# Patient Record
Sex: Male | Born: 1940 | Race: White | Hispanic: No | Marital: Married | Smoking: Never smoker
Health system: Southern US, Community
[De-identification: ages and names within clinical notes are randomized; demographics above are authoritative.]

## PROBLEM LIST (undated history)

## (undated) DIAGNOSIS — G709 Myoneural disorder, unspecified: Secondary | ICD-10-CM

## (undated) DIAGNOSIS — I4891 Unspecified atrial fibrillation: Secondary | ICD-10-CM

## (undated) DIAGNOSIS — E118 Type 2 diabetes mellitus with unspecified complications: Secondary | ICD-10-CM

## (undated) DIAGNOSIS — I2510 Atherosclerotic heart disease of native coronary artery without angina pectoris: Secondary | ICD-10-CM

## (undated) DIAGNOSIS — I9789 Other postprocedural complications and disorders of the circulatory system, not elsewhere classified: Secondary | ICD-10-CM

## (undated) DIAGNOSIS — I10 Essential (primary) hypertension: Secondary | ICD-10-CM

## (undated) DIAGNOSIS — T7840XA Allergy, unspecified, initial encounter: Secondary | ICD-10-CM

## (undated) DIAGNOSIS — E785 Hyperlipidemia, unspecified: Secondary | ICD-10-CM

## (undated) DIAGNOSIS — G4700 Insomnia, unspecified: Secondary | ICD-10-CM

## (undated) DIAGNOSIS — E119 Type 2 diabetes mellitus without complications: Secondary | ICD-10-CM

## (undated) HISTORY — DX: Type 2 diabetes mellitus with unspecified complications: E11.8

## (undated) HISTORY — DX: Type 2 diabetes mellitus without complications: E11.9

## (undated) HISTORY — DX: Allergy, unspecified, initial encounter: T78.40XA

## (undated) HISTORY — PX: CARDIAC CATHETERIZATION: SHX172

## (undated) HISTORY — DX: Essential (primary) hypertension: I10

## (undated) HISTORY — DX: Other postprocedural complications and disorders of the circulatory system, not elsewhere classified: I97.89

## (undated) HISTORY — DX: Unspecified atrial fibrillation: I48.91

## (undated) HISTORY — DX: Hyperlipidemia, unspecified: E78.5

## (undated) HISTORY — PX: EYE SURGERY: SHX253

## (undated) HISTORY — PX: TONSILLECTOMY: SUR1361

## (undated) HISTORY — DX: Insomnia, unspecified: G47.00

## (undated) HISTORY — PX: BACK SURGERY: SHX140

---

## 1991-03-10 HISTORY — PX: LAMINECTOMY: SHX219

## 1994-03-09 HISTORY — PX: MENISCUS REPAIR: SHX5179

## 2015-03-10 DIAGNOSIS — H269 Unspecified cataract: Secondary | ICD-10-CM

## 2015-03-10 HISTORY — DX: Unspecified cataract: H26.9

## 2015-10-07 ENCOUNTER — Encounter (INDEPENDENT_AMBULATORY_CARE_PROVIDER_SITE_OTHER): Payer: Self-pay

## 2015-10-25 ENCOUNTER — Ambulatory Visit (INDEPENDENT_AMBULATORY_CARE_PROVIDER_SITE_OTHER): Payer: Medicare Other | Admitting: Family Medicine

## 2015-10-25 ENCOUNTER — Encounter: Payer: Self-pay | Admitting: Family Medicine

## 2015-10-25 ENCOUNTER — Ambulatory Visit: Payer: Self-pay | Admitting: Unknown Physician Specialty

## 2015-10-25 VITALS — BP 157/74 | HR 65 | Temp 98.0°F | Ht 69.75 in | Wt 171.0 lb

## 2015-10-25 DIAGNOSIS — E785 Hyperlipidemia, unspecified: Secondary | ICD-10-CM

## 2015-10-25 DIAGNOSIS — E1159 Type 2 diabetes mellitus with other circulatory complications: Secondary | ICD-10-CM | POA: Insufficient documentation

## 2015-10-25 DIAGNOSIS — Z7189 Other specified counseling: Secondary | ICD-10-CM

## 2015-10-25 DIAGNOSIS — I10 Essential (primary) hypertension: Secondary | ICD-10-CM | POA: Diagnosis not present

## 2015-10-25 DIAGNOSIS — E119 Type 2 diabetes mellitus without complications: Secondary | ICD-10-CM

## 2015-10-25 DIAGNOSIS — I152 Hypertension secondary to endocrine disorders: Secondary | ICD-10-CM | POA: Insufficient documentation

## 2015-10-25 DIAGNOSIS — Z7689 Persons encountering health services in other specified circumstances: Secondary | ICD-10-CM

## 2015-10-25 MED ORDER — AMLODIPINE BESYLATE 10 MG PO TABS: 10.0000 mg | ORAL_TABLET | Freq: Every day | ORAL | 4 refills | Status: DC

## 2015-10-25 MED ORDER — LISINOPRIL-HYDROCHLOROTHIAZIDE 20-12.5 MG PO TABS: 1.0000 | ORAL_TABLET | Freq: Every day | ORAL | 4 refills | Status: DC

## 2015-10-25 MED ORDER — TRAZODONE HCL 50 MG PO TABS: 100.0000 mg | ORAL_TABLET | Freq: Every day | ORAL | 4 refills | Status: DC

## 2015-10-25 MED ORDER — METOPROLOL TARTRATE 50 MG PO TABS: 50.0000 mg | ORAL_TABLET | Freq: Two times a day (BID) | ORAL | 4 refills | Status: DC

## 2015-10-25 MED ORDER — METFORMIN HCL 1000 MG PO TABS: 1000.0000 mg | ORAL_TABLET | Freq: Two times a day (BID) | ORAL | 4 refills | Status: DC

## 2015-10-25 MED ORDER — FENOFIBRATE 160 MG PO TABS: 160.0000 mg | ORAL_TABLET | Freq: Every day | ORAL | 4 refills | Status: DC

## 2015-10-25 MED ORDER — GLIMEPIRIDE 4 MG PO TABS
4.0000 mg | ORAL_TABLET | Freq: Every day | ORAL | 4 refills | Status: DC
Start: 2015-10-25 — End: 2016-08-17

## 2015-10-25 MED ORDER — PRAVASTATIN SODIUM 20 MG PO TABS: 20.0000 mg | ORAL_TABLET | Freq: Every day | ORAL | 4 refills | Status: DC

## 2015-10-25 MED ORDER — SITAGLIPTIN PHOSPHATE 100 MG PO TABS: 100.0000 mg | ORAL_TABLET | Freq: Every day | ORAL | 4 refills | Status: DC

## 2015-10-25 MED ORDER — LISINOPRIL 20 MG PO TABS: 20.0000 mg | ORAL_TABLET | Freq: Every day | ORAL | 4 refills | Status: DC

## 2015-10-25 NOTE — Patient Instructions (Signed)
Follow up as scheduled.  

## 2015-10-25 NOTE — Progress Notes (Signed)
BP (!) 157/74   Pulse 65   Temp 98 F (36.7 C)   Ht 5' 9.75" (1.772 m)   Wt 171 lb (77.6 kg)   SpO2 97%   BMI 24.71 kg/m    Subjective:    Patient ID: Luis Wise, male    DOB: 08/26/1940, 75 y.o.   MRN: [id number]  HPI: Luis Wise is a 75 y.o. male  Chief Complaint  Patient presents with  . Establish Care    [place]oved here from [place]. States he is up to date on his vaccines, will get his medical records with those dates.    Patient presents today to Establish Care. He has several stable chronic conditions, including HTN, HLD, and DM type 2. He will be needing refills on all medications as they will run out in September. He is also in need of a Flight Physical in the near future. No concerns today.  Relevant past medical, surgical, family and social history reviewed and updated as indicated. Interim medical history since our last visit reviewed. Allergies and medications reviewed and updated.  Review of Systems  Constitutional: Negative.   Respiratory: Negative.   Cardiovascular: Negative.   Gastrointestinal: Negative.   Musculoskeletal: Negative.   Neurological: Negative.   Psychiatric/Behavioral: Negative.     Per HPI unless specifically indicated above     Objective:    BP (!) 157/74   Pulse 65   Temp 98 F (36.7 C)   Ht 5' 9.75" (1.772 m)   Wt 171 lb (77.6 kg)   SpO2 97%   BMI 24.71 kg/m   Wt Readings from Last 3 Encounters:  10/25/15 171 lb (77.6 kg)    Physical Exam  Constitutional: He is oriented to person, place, and time. He appears well-developed and well-nourished. No distress.  HENT:  Head: Atraumatic.  Eyes: Conjunctivae are normal. No scleral icterus.  Neck: Normal range of motion. Neck supple.  Cardiovascular: Normal rate and normal heart sounds.   Pulmonary/Chest: Effort normal and breath sounds normal. No respiratory distress.  Musculoskeletal: Normal range of motion.  Neurological: He is alert and oriented to person, place, and time.    Skin: Skin is warm and dry.  Psychiatric: He has a normal mood and affect. His behavior is normal.  Nursing note and vitals reviewed.   No results found for this or any previous visit.    Assessment & Plan:   Problem List Items Addressed This Visit      Cardiovascular and Mediastinum   Benign essential HTN   Relevant Medications   amLODipine (NORVASC) 10 MG tablet   fenofibrate 160 MG tablet   lisinopril (PRINIVIL,ZESTRIL) 20 MG tablet   lisinopril-hydrochlorothiazide (PRINZIDE,ZESTORETIC) 20-12.5 MG tablet   metoprolol (LOPRESSOR) 50 MG tablet   pravastatin (PRAVACHOL) 20 MG tablet     Endocrine   Diabetes mellitus (HCC)   Relevant Medications   glimepiride (AMARYL) 4 MG tablet   lisinopril (PRINIVIL,ZESTRIL) 20 MG tablet   lisinopril-hydrochlorothiazide (PRINZIDE,ZESTORETIC) 20-12.5 MG tablet   metFORMIN (GLUCOPHAGE) 1000 MG tablet   pravastatin (PRAVACHOL) 20 MG tablet   sitaGLIPtin (JANUVIA) 100 MG tablet     Other   Hyperlipemia   Relevant Medications   amLODipine (NORVASC) 10 MG tablet   fenofibrate 160 MG tablet   lisinopril (PRINIVIL,ZESTRIL) 20 MG tablet   lisinopril-hydrochlorothiazide (PRINZIDE,ZESTORETIC) 20-12.5 MG tablet   metoprolol (LOPRESSOR) 50 MG tablet   pravastatin (PRAVACHOL) 20 MG tablet    Other Visit Diagnoses    Encounter to establish care    -  Primary    Medication refills sent today, record release form filled out. He will schedule Flight Physical at his earliest convenience.    Follow up plan: Return for Physical.

## 2015-12-11 DIAGNOSIS — H2513 Age-related nuclear cataract, bilateral: Secondary | ICD-10-CM | POA: Diagnosis not present

## 2015-12-11 LAB — HM DIABETES EYE EXAM

## 2015-12-17 DIAGNOSIS — Z23 Encounter for immunization: Secondary | ICD-10-CM | POA: Diagnosis not present

## 2016-05-09 ENCOUNTER — Encounter: Payer: Self-pay | Admitting: Family Medicine

## 2016-05-29 ENCOUNTER — Encounter: Payer: Self-pay | Admitting: Unknown Physician Specialty

## 2016-06-03 ENCOUNTER — Other Ambulatory Visit: Payer: Medicare Other

## 2016-06-03 DIAGNOSIS — E119 Type 2 diabetes mellitus without complications: Secondary | ICD-10-CM | POA: Diagnosis not present

## 2016-06-03 DIAGNOSIS — I10 Essential (primary) hypertension: Secondary | ICD-10-CM

## 2016-06-03 DIAGNOSIS — E782 Mixed hyperlipidemia: Secondary | ICD-10-CM

## 2016-06-03 DIAGNOSIS — R3911 Hesitancy of micturition: Secondary | ICD-10-CM

## 2016-06-03 LAB — UA/M W/RFLX CULTURE, ROUTINE
Bilirubin, UA: NEGATIVE
Glucose, UA: NEGATIVE
Leukocytes, UA: NEGATIVE
Nitrite, UA: NEGATIVE
RBC, UA: NEGATIVE
Specific Gravity, UA: 1.02 (ref 1.005–1.030)
Urobilinogen, Ur: 0.2 mg/dL (ref 0.2–1.0)
pH, UA: 5.5 (ref 5.0–7.5)

## 2016-06-03 LAB — MICROSCOPIC EXAMINATION

## 2016-06-04 ENCOUNTER — Encounter: Payer: Self-pay | Admitting: Family Medicine

## 2016-06-04 LAB — COMPREHENSIVE METABOLIC PANEL
ALT: 20 IU/L (ref 0–44)
AST: 20 IU/L (ref 0–40)
Albumin/Globulin Ratio: 2.4 — ABNORMAL HIGH (ref 1.2–2.2)
Albumin: 5 g/dL — ABNORMAL HIGH (ref 3.5–4.8)
Alkaline Phosphatase: 41 IU/L (ref 39–117)
BUN/Creatinine Ratio: 12 (ref 10–24)
BUN: 15 mg/dL (ref 8–27)
Bilirubin Total: 0.4 mg/dL (ref 0.0–1.2)
CO2: 24 mmol/L (ref 18–29)
Calcium: 9.7 mg/dL (ref 8.6–10.2)
Chloride: 99 mmol/L (ref 96–106)
Creatinine, Ser: 1.24 mg/dL (ref 0.76–1.27)
GFR calc Af Amer: 66 mL/min/{1.73_m2} (ref >59)
GFR calc non Af Amer: 57 mL/min/{1.73_m2} — ABNORMAL LOW (ref >59)
Globulin, Total: 2.1 g/dL (ref 1.5–4.5)
Glucose: 137 mg/dL — ABNORMAL HIGH (ref 65–99)
Potassium: 4.4 mmol/L (ref 3.5–5.2)
Sodium: 140 mmol/L (ref 134–144)
Total Protein: 7.1 g/dL (ref 6.0–8.5)

## 2016-06-04 LAB — CBC WITH DIFFERENTIAL/PLATELET
Basophils Absolute: 0.1 10*3/uL (ref 0.0–0.2)
Basos: 1 %
EOS (ABSOLUTE): 0.1 10*3/uL (ref 0.0–0.4)
Eos: 2 %
Hematocrit: 41.6 % (ref 37.5–51.0)
Hemoglobin: 14.6 g/dL (ref 13.0–17.7)
Immature Grans (Abs): 0 10*3/uL (ref 0.0–0.1)
Immature Granulocytes: 0 %
Lymphocytes Absolute: 1 10*3/uL (ref 0.7–3.1)
Lymphs: 18 %
MCH: 28.7 pg (ref 26.6–33.0)
MCHC: 35.1 g/dL (ref 31.5–35.7)
MCV: 82 fL (ref 79–97)
Monocytes Absolute: 0.6 10*3/uL (ref 0.1–0.9)
Monocytes: 10 %
Neutrophils Absolute: 3.8 10*3/uL (ref 1.4–7.0)
Neutrophils: 69 %
Platelets: 287 10*3/uL (ref 150–379)
RBC: 5.08 x10E6/uL (ref 4.14–5.80)
RDW: 13.7 % (ref 12.3–15.4)
WBC: 5.5 10*3/uL (ref 3.4–10.8)

## 2016-06-04 LAB — TSH: TSH: 0.921 u[IU]/mL (ref 0.450–4.500)

## 2016-06-04 LAB — LIPID PANEL
Chol/HDL Ratio: 4.6 ratio units (ref 0.0–5.0)
Cholesterol, Total: 137 mg/dL (ref 100–199)
HDL: 30 mg/dL — ABNORMAL LOW (ref >39)
LDL Calculated: 79 mg/dL (ref 0–99)
Triglycerides: 141 mg/dL (ref 0–149)
VLDL Cholesterol Cal: 28 mg/dL (ref 5–40)

## 2016-06-04 LAB — PSA: Prostate Specific Ag, Serum: 0.9 ng/mL (ref 0.0–4.0)

## 2016-06-08 ENCOUNTER — Encounter: Payer: Self-pay | Admitting: Family Medicine

## 2016-06-08 ENCOUNTER — Ambulatory Visit (INDEPENDENT_AMBULATORY_CARE_PROVIDER_SITE_OTHER): Payer: Medicare Other | Admitting: Family Medicine

## 2016-06-08 VITALS — BP 138/70 | HR 73 | Temp 97.9°F | Ht 69.5 in | Wt 173.6 lb

## 2016-06-08 DIAGNOSIS — N400 Enlarged prostate without lower urinary tract symptoms: Secondary | ICD-10-CM

## 2016-06-08 DIAGNOSIS — E782 Mixed hyperlipidemia: Secondary | ICD-10-CM

## 2016-06-08 DIAGNOSIS — I10 Essential (primary) hypertension: Secondary | ICD-10-CM | POA: Diagnosis not present

## 2016-06-08 DIAGNOSIS — E119 Type 2 diabetes mellitus without complications: Secondary | ICD-10-CM | POA: Diagnosis not present

## 2016-06-08 LAB — BAYER DCA HB A1C WAIVED: HB A1C (BAYER DCA - WAIVED): 6.3 % (ref <7.0)

## 2016-06-08 NOTE — Assessment & Plan Note (Signed)
The current medical regimen is effective;  continue present plan and medications.  

## 2016-06-08 NOTE — Progress Notes (Signed)
BP 138/70 (BP Location: Left Arm)   Pulse 73   Temp 97.9 F (36.6 C)   Ht 5' 9.5" (1.765 m)   Wt 173 lb 9.6 oz (78.7 kg)   SpO2 97%   BMI 25.27 kg/m    Subjective:    Patient ID: Luis Wise, male    DOB: [birth year], 76 y.o.   MRN: [id number]  HPI: Luis Wise is a 76 y.o. male  Chief Complaint  Patient presents with  . Annual Exam  . Flight Physical  Patient all in all doing well not for [place] flight physical today. AWV metrics met Medical issues diabetes doing well has occasional blood sugars getting lower when does a lot of heavy yard work. Is able to easily recognize this and eat a little something in symptoms go away. Otherwise doing well with medications. Taking fenofibrate and pravastatin without problems good control. Takes trazodone nightly for sleep which helps a great deal with no daytime drowsiness or other issues. Takes blood pressure medications without side effects or issues. Has good control of blood pressure.  Relevant past medical, surgical, family and social history reviewed and updated as indicated. Interim medical history since our last visit reviewed. Allergies and medications reviewed and updated.  Review of Systems  Constitutional: Negative.   HENT: Negative.   Eyes: Negative.   Respiratory: Negative.   Cardiovascular: Negative.   Gastrointestinal: Negative.   Endocrine: Negative.   Genitourinary: Negative.   Musculoskeletal: Negative.   Skin: Negative.   Allergic/Immunologic: Negative.   Neurological: Negative.   Hematological: Negative.   Psychiatric/Behavioral: Negative.     Per HPI unless specifically indicated above     Objective:    BP 138/70 (BP Location: Left Arm)   Pulse 73   Temp 97.9 F (36.6 C)   Ht 5' 9.5" (1.765 m)   Wt 173 lb 9.6 oz (78.7 kg)   SpO2 97%   BMI 25.27 kg/m   Wt Readings from Last 3 Encounters:  06/08/16 173 lb 9.6 oz (78.7 kg)  10/25/15 171 lb (77.6 kg)    Physical Exam  Constitutional: He is  oriented to person, place, and time. He appears well-developed and well-nourished.  HENT:  Head: Normocephalic and atraumatic.  Right Ear: External ear normal.  Left Ear: External ear normal.  Eyes: Conjunctivae and EOM are normal. Pupils are equal, round, and reactive to light.  Neck: Normal range of motion. Neck supple.  Cardiovascular: Normal rate, regular rhythm, normal heart sounds and intact distal pulses.   Pulmonary/Chest: Effort normal and breath sounds normal.  Abdominal: Soft. Bowel sounds are normal. There is no splenomegaly or hepatomegaly.  Genitourinary: Rectum normal and penis normal.  Genitourinary Comments: BPH  Musculoskeletal: Normal range of motion.  Neurological: He is alert and oriented to person, place, and time. He has normal reflexes.  Skin: No rash noted. No erythema.  Psychiatric: He has a normal mood and affect. His behavior is normal. Judgment and thought content normal.    Results for orders placed or performed in visit on 06/03/16  Microscopic Examination  Result Value Ref Range   Epithelial Cells (non renal) 0-10 0 - 10 /hpf   Casts Present None seen /lpf   Cast Type Granular casts (A) N/A   Bacteria, UA Few None seen/Few  CBC with Differential/Platelet  Result Value Ref Range   WBC 5.5 3.4 - 10.8 x10E3/uL   RBC 5.08 4.14 - 5.80 x10E6/uL   Hemoglobin 14.6 13.0 - 17.7 g/dL   Hematocrit  41.6 37.5 - 51.0 %   MCV 82 79 - 97 fL   MCH 28.7 26.6 - 33.0 pg   MCHC 35.1 31.5 - 35.7 g/dL   RDW 13.7 12.3 - 15.4 %   Platelets 287 150 - 379 x10E3/uL   Neutrophils 69 Not Estab. %   Lymphs 18 Not Estab. %   Monocytes 10 Not Estab. %   Eos 2 Not Estab. %   Basos 1 Not Estab. %   Neutrophils Absolute 3.8 1.4 - 7.0 x10E3/uL   Lymphocytes Absolute 1.0 0.7 - 3.1 x10E3/uL   Monocytes Absolute 0.6 0.1 - 0.9 x10E3/uL   EOS (ABSOLUTE) 0.1 0.0 - 0.4 x10E3/uL   Basophils Absolute 0.1 0.0 - 0.2 x10E3/uL   Immature Granulocytes 0 Not Estab. %   Immature Grans (Abs)  0.0 0.0 - 0.1 x10E3/uL  Comprehensive metabolic panel  Result Value Ref Range   Glucose 137 (H) 65 - 99 mg/dL   BUN 15 8 - 27 mg/dL   Creatinine, Ser 1.24 0.76 - 1.27 mg/dL   GFR calc non Af Amer 57 (L) >59 mL/min/1.73   GFR calc Af Amer 66 >59 mL/min/1.73   BUN/Creatinine Ratio 12 10 - 24   Sodium 140 134 - 144 mmol/L   Potassium 4.4 3.5 - 5.2 mmol/L   Chloride 99 96 - 106 mmol/L   CO2 24 18 - 29 mmol/L   Calcium 9.7 8.6 - 10.2 mg/dL   Total Protein 7.1 6.0 - 8.5 g/dL   Albumin 5.0 (H) 3.5 - 4.8 g/dL   Globulin, Total 2.1 1.5 - 4.5 g/dL   Albumin/Globulin Ratio 2.4 (H) 1.2 - 2.2   Bilirubin Total 0.4 0.0 - 1.2 mg/dL   Alkaline Phosphatase 41 39 - 117 IU/L   AST 20 0 - 40 IU/L   ALT 20 0 - 44 IU/L  UA/M w/rflx Culture, Routine  Result Value Ref Range   Specific Gravity, UA 1.020 1.005 - 1.030   pH, UA 5.5 5.0 - 7.5   Color, UA Yellow Yellow   Appearance Ur Clear Clear   Leukocytes, UA Negative Negative   Protein, UA 1+ (A) Negative/Trace   Glucose, UA Negative Negative   Ketones, UA Trace (A) Negative   RBC, UA Negative Negative   Bilirubin, UA Negative Negative   Urobilinogen, Ur 0.2 0.2 - 1.0 mg/dL   Nitrite, UA Negative Negative   Microscopic Examination See below:   TSH  Result Value Ref Range   TSH 0.921 0.450 - 4.500 uIU/mL  PSA  Result Value Ref Range   Prostate Specific Ag, Serum 0.9 0.0 - 4.0 ng/mL  Lipid Profile  Result Value Ref Range   Cholesterol, Total 137 100 - 199 mg/dL   Triglycerides 141 0 - 149 mg/dL   HDL 30 (L) >39 mg/dL   VLDL Cholesterol Cal 28 5 - 40 mg/dL   LDL Calculated 79 0 - 99 mg/dL   Chol/HDL Ratio 4.6 0.0 - 5.0 ratio units      Assessment & Plan:   Problem List Items Addressed This Visit      Cardiovascular and Mediastinum   Benign essential HTN    The current medical regimen is effective;  continue present plan and medications.         Endocrine   Diabetes mellitus ([place]) - Primary    The current medical regimen is  effective;  continue present plan and medications.       Relevant Orders   Bayer DCA Hb A1c  Waived     Genitourinary   BPH (benign prostatic hyperplasia)    Stable no sx        Other   Hyperlipemia    The current medical regimen is effective;  continue present plan and medications.           Follow up plan: Return for Hemoglobin A1c.

## 2016-06-08 NOTE — Assessment & Plan Note (Signed)
Stable no sx 

## 2016-06-10 ENCOUNTER — Telehealth: Payer: Self-pay | Admitting: Family Medicine

## 2016-06-10 NOTE — Telephone Encounter (Signed)
Patient needs script called in to [facility] for his strips and meter supplies  Thanks

## 2016-06-10 NOTE — Telephone Encounter (Signed)
Refaxed to [facility].

## 2016-06-24 ENCOUNTER — Encounter: Payer: Self-pay | Admitting: Family Medicine

## 2016-08-06 ENCOUNTER — Encounter: Payer: Self-pay | Admitting: Family Medicine

## 2016-08-06 ENCOUNTER — Ambulatory Visit (INDEPENDENT_AMBULATORY_CARE_PROVIDER_SITE_OTHER): Payer: Self-pay | Admitting: Family Medicine

## 2016-08-06 VITALS — BP 150/80 | HR 65 | Ht 70.5 in | Wt 175.0 lb

## 2016-08-06 DIAGNOSIS — Z029 Encounter for administrative examinations, unspecified: Secondary | ICD-10-CM

## 2016-08-06 DIAGNOSIS — Z0289 Encounter for other administrative examinations: Secondary | ICD-10-CM

## 2016-08-06 NOTE — Progress Notes (Signed)
   BP (!) 150/80 (BP Location: Left Arm)   Pulse 65   Ht 5' 10.5" (1.791 m)   Wt 175 lb (79.4 kg)   BMI 24.75 kg/m    Subjective:    Patient ID: Luis Wise, male    DOB: 05/18/1940, 76 y.o.   MRN: [id number]  HPI: Luis HatchJohn Wise is a 76 y.o. 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  Relevant past medical, surgical, family and social history reviewed and updated as indicated. Interim medical history since our last visit reviewed. Allergies and medications reviewed and updated.  Review of Systems  Constitutional: Negative.   HENT: Negative.   Eyes: Negative.   Respiratory: Negative.   Cardiovascular: Negative.   Gastrointestinal: Negative.   Endocrine: Negative.   Genitourinary: Negative.   Musculoskeletal: Negative.   Skin: Negative.   Allergic/Immunologic: Negative.   Neurological: Negative.   Hematological: Negative.   Psychiatric/Behavioral: Negative.     Per HPI unless specifically indicated above     Objective:    BP (!) 150/80 (BP Location: Left Arm)   Pulse 65   Ht 5' 10.5" (1.791 m)   Wt 175 lb (79.4 kg)   BMI 24.75 kg/m   Wt Readings from Last 3 Encounters:  08/06/16 175 lb (79.4 kg)  06/08/16 173 lb 9.6 oz (78.7 kg)  10/25/15 171 lb (77.6 kg)    Physical Exam  Constitutional: He is oriented to person, place, and time. He appears well-developed and well-nourished.  HENT:  Head: Normocephalic.  Right Ear: External ear normal.  Left Ear: External ear normal.  Nose: Nose normal.  Eyes: Conjunctivae and EOM are normal. Pupils are equal, round, and reactive to light.  Neck: Normal range of motion. Neck supple. No thyromegaly present.  Cardiovascular: Normal rate, regular rhythm, normal heart sounds and intact distal pulses.   Pulmonary/Chest: Effort normal and breath sounds normal.  Abdominal: Soft. Bowel sounds are normal. There is no splenomegaly or  hepatomegaly.  Genitourinary: Penis normal.  Musculoskeletal: Normal range of motion.  Lymphadenopathy:    He has no cervical adenopathy.  Neurological: He is alert and oriented to person, place, and time. He has normal reflexes.  Skin: Skin is warm and dry.  Psychiatric: He has a normal mood and affect. His behavior is normal. Judgment and thought content normal.    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

## 2016-08-17 ENCOUNTER — Ambulatory Visit (INDEPENDENT_AMBULATORY_CARE_PROVIDER_SITE_OTHER): Payer: Medicare Other | Admitting: Family Medicine

## 2016-08-17 ENCOUNTER — Encounter: Payer: Self-pay | Admitting: Family Medicine

## 2016-08-17 VITALS — BP 160/76 | HR 66 | Temp 98.5°F | Wt 171.0 lb

## 2016-08-17 DIAGNOSIS — J069 Acute upper respiratory infection, unspecified: Secondary | ICD-10-CM

## 2016-08-17 MED ORDER — AZITHROMYCIN 250 MG PO TABS: ORAL_TABLET | ORAL | 0 refills | Status: DC

## 2016-08-17 MED ORDER — LISINOPRIL-HYDROCHLOROTHIAZIDE 20-12.5 MG PO TABS: 1.0000 | ORAL_TABLET | Freq: Every day | ORAL | 4 refills | Status: DC

## 2016-08-17 MED ORDER — LISINOPRIL 20 MG PO TABS: 20.0000 mg | ORAL_TABLET | Freq: Every day | ORAL | 4 refills | Status: DC

## 2016-08-17 MED ORDER — GLIMEPIRIDE 4 MG PO TABS: 4.0000 mg | ORAL_TABLET | Freq: Every day | ORAL | 4 refills | Status: DC

## 2016-08-17 MED ORDER — METFORMIN HCL 1000 MG PO TABS: 1000.0000 mg | ORAL_TABLET | Freq: Two times a day (BID) | ORAL | 4 refills | Status: DC

## 2016-08-17 MED ORDER — TRAZODONE HCL 50 MG PO TABS: 50.0000 mg | ORAL_TABLET | Freq: Every day | ORAL | 3 refills | Status: DC

## 2016-08-17 MED ORDER — AMLODIPINE BESYLATE 10 MG PO TABS: 10.0000 mg | ORAL_TABLET | Freq: Every day | ORAL | 4 refills | Status: DC

## 2016-08-17 MED ORDER — SITAGLIPTIN PHOSPHATE 100 MG PO TABS: 100.0000 mg | ORAL_TABLET | Freq: Every day | ORAL | 4 refills | Status: DC

## 2016-08-17 MED ORDER — FENOFIBRATE 160 MG PO TABS: 160.0000 mg | ORAL_TABLET | Freq: Every day | ORAL | 4 refills | Status: DC

## 2016-08-17 MED ORDER — PRAVASTATIN SODIUM 20 MG PO TABS: 20.0000 mg | ORAL_TABLET | Freq: Every day | ORAL | 4 refills | Status: DC

## 2016-08-17 MED ORDER — METOPROLOL TARTRATE 50 MG PO TABS: 50.0000 mg | ORAL_TABLET | Freq: Two times a day (BID) | ORAL | 4 refills | Status: DC

## 2016-08-17 NOTE — Progress Notes (Signed)
   BP (!) 160/76   Pulse 66   Temp 98.5 F (36.9 C)   Wt 171 lb (77.6 kg)   SpO2 96%   BMI 24.19 kg/m    Subjective:    Patient ID: Luis Wise, male    DOB: 06/04/1940, 76 y.o.   MRN: [id number]  HPI: Luis Wise is a 76 y.o. male  Chief Complaint  Patient presents with  . URI    x 3 days, sore throat, head/chest congestion, productive cough, some sneezing, runny nose. No fever, no ear ache, sinus drainage.    Patient presents with 3-4 days of productive cough of thick white mucus, sore throat, and congestion. Denies fever, chills, CP, SOB, N/V/D. No sick contacts, but has been traveling and attending conferences lately. Taking dayquil and nyquil with some relief.   Relevant past medical, surgical, family and social history reviewed and updated as indicated. Interim medical history since our last visit reviewed. Allergies and medications reviewed and updated.  Review of Systems  Constitutional: Negative.   HENT: Positive for congestion and sore throat.   Respiratory: Positive for cough.   Cardiovascular: Negative.   Gastrointestinal: Negative.   Genitourinary: Negative.   Musculoskeletal: Negative.   Neurological: Negative.   Psychiatric/Behavioral: Negative.    Per HPI unless specifically indicated above     Objective:    BP (!) 160/76   Pulse 66   Temp 98.5 F (36.9 C)   Wt 171 lb (77.6 kg)   SpO2 96%   BMI 24.19 kg/m   Wt Readings from Last 3 Encounters:  08/17/16 171 lb (77.6 kg)  08/06/16 175 lb (79.4 kg)  06/08/16 173 lb 9.6 oz (78.7 kg)    Physical Exam  Constitutional: He is oriented to person, place, and time. He appears well-developed and well-nourished. No distress.  HENT:  Head: Atraumatic.  Right Ear: External ear normal.  Left Ear: External ear normal.  Nose: Nose normal.  Mouth/Throat: No oropharyngeal exudate.  Oropharynx erythematous  Eyes: Conjunctivae are normal. Pupils are equal, round, and reactive to light. No scleral icterus.    Neck: Normal range of motion. Neck supple.  Cardiovascular: Normal rate and normal heart sounds.   Pulmonary/Chest: Effort normal and breath sounds normal. No respiratory distress.  Musculoskeletal: Normal range of motion.  Lymphadenopathy:    He has no cervical adenopathy.  Neurological: He is alert and oriented to person, place, and time.  Skin: Skin is warm and dry.  Psychiatric: He has a normal mood and affect. His behavior is normal.  Nursing note and vitals reviewed.  Results for orders placed or performed in visit on 06/08/16  Bayer DCA Hb A1c Waived  Result Value Ref Range   Bayer DCA Hb A1c Waived 6.3 <7.0 %      Assessment & Plan:   Problem List Items Addressed This Visit    None    Visit Diagnoses    Upper respiratory tract infection, unspecified type    -  Primary   Continue dayquil, nyquil, and plain mucinex as these seem to provide relief. If no better by end of week, can pick up zpack. Supportive care discussed.    Relevant Medications   azithromycin (ZITHROMAX) 250 MG tablet    F/u if worsening or no improvement.   Follow up plan: Return for as scheduled.

## 2016-08-27 ENCOUNTER — Encounter: Payer: Self-pay | Admitting: Family Medicine

## 2016-08-27 ENCOUNTER — Other Ambulatory Visit: Payer: Self-pay | Admitting: Family Medicine

## 2016-08-27 MED ORDER — BENZONATATE 100 MG PO CAPS: 200.0000 mg | ORAL_CAPSULE | Freq: Three times a day (TID) | ORAL | 0 refills | Status: DC | PRN

## 2016-08-27 MED ORDER — HYDROCOD POLST-CPM POLST ER 10-8 MG/5ML PO SUER
5.0000 mL | Freq: Two times a day (BID) | ORAL | 0 refills | Status: DC | PRN
Start: 2016-08-27 — End: 2016-09-14

## 2016-09-14 ENCOUNTER — Encounter: Payer: Self-pay | Admitting: Family Medicine

## 2016-09-14 ENCOUNTER — Ambulatory Visit (INDEPENDENT_AMBULATORY_CARE_PROVIDER_SITE_OTHER): Payer: Medicare Other | Admitting: Family Medicine

## 2016-09-14 VITALS — BP 128/70 | HR 62 | Wt 172.0 lb

## 2016-09-14 DIAGNOSIS — E119 Type 2 diabetes mellitus without complications: Secondary | ICD-10-CM | POA: Diagnosis not present

## 2016-09-14 DIAGNOSIS — I10 Essential (primary) hypertension: Secondary | ICD-10-CM

## 2016-09-14 DIAGNOSIS — E782 Mixed hyperlipidemia: Secondary | ICD-10-CM

## 2016-09-14 LAB — BAYER DCA HB A1C WAIVED: HB A1C (BAYER DCA - WAIVED): 6.5 % (ref <7.0)

## 2016-09-14 NOTE — Assessment & Plan Note (Signed)
The current medical regimen is effective;  continue present plan and medications.  

## 2016-09-14 NOTE — Progress Notes (Signed)
   BP 128/70 (BP Location: Left Arm)   Pulse 62   Wt 172 lb (78 kg)   SpO2 98%   BMI 24.33 kg/m    Subjective:    Patient ID: Luis Wise, male    DOB: 01/05/1941, 76 y.o.   MRN: [id number]  HPI: Luis Wise is a 76 y.o. male  Chief Complaint  Patient presents with  . Follow-up  . Diabetes  Patient all in all doing well noted low blood sugar spells taking diabetes medicines without problems and good control. Blood pressure doing well with medications no complaints or issues uses trazodone when necessary and wait several days before flying with no trazodone. Cholesterol also doing well with no complaints.  Relevant past medical, surgical, family and social history reviewed and updated as indicated. Interim medical history since our last visit reviewed. Allergies and medications reviewed and updated.  Review of Systems  Constitutional: Negative.   Respiratory: Negative.   Cardiovascular: Negative.     Per HPI unless specifically indicated above     Objective:    BP 128/70 (BP Location: Left Arm)   Pulse 62   Wt 172 lb (78 kg)   SpO2 98%   BMI 24.33 kg/m   Wt Readings from Last 3 Encounters:  09/14/16 172 lb (78 kg)  08/17/16 171 lb (77.6 kg)  08/06/16 175 lb (79.4 kg)    Physical Exam  Constitutional: He is oriented to person, place, and time. He appears well-developed and well-nourished.  HENT:  Head: Normocephalic and atraumatic.  Eyes: Conjunctivae and EOM are normal.  Neck: Normal range of motion.  Cardiovascular: Normal rate, regular rhythm and normal heart sounds.   Pulmonary/Chest: Effort normal and breath sounds normal.  Musculoskeletal: Normal range of motion.  Neurological: He is alert and oriented to person, place, and time.  Skin: No erythema.  Psychiatric: He has a normal mood and affect. His behavior is normal. Judgment and thought content normal.    Results for orders placed or performed in visit on 06/08/16  Bayer DCA Hb A1c Waived  Result  Value Ref Range   Bayer DCA Hb A1c Waived 6.3 <7.0 %      Assessment & Plan:   Problem List Items Addressed This Visit      Cardiovascular and Mediastinum   Benign essential HTN    The current medical regimen is effective;  continue present plan and medications.       Relevant Orders   Bayer DCA Hb A1c Waived     Endocrine   Diabetes mellitus (HCC) - Primary    The current medical regimen is effective;  continue present plan and medications.       Relevant Medications   glimepiride (AMARYL) 4 MG tablet   Other Relevant Orders   Bayer DCA Hb A1c Waived     Other   Hyperlipemia    The current medical regimen is effective;  continue present plan and medications.       Relevant Orders   Bayer DCA Hb A1c Waived       Follow up plan: Return in about 3 months (around 12/15/2016) for Hemoglobin A1c, BMP,  Lipids, ALT, AST.

## 2016-11-02 ENCOUNTER — Encounter: Payer: Self-pay | Admitting: Unknown Physician Specialty

## 2016-12-22 ENCOUNTER — Ambulatory Visit: Payer: Medicare Other | Admitting: Unknown Physician Specialty

## 2016-12-25 DIAGNOSIS — Z23 Encounter for immunization: Secondary | ICD-10-CM | POA: Diagnosis not present

## 2016-12-29 ENCOUNTER — Ambulatory Visit (INDEPENDENT_AMBULATORY_CARE_PROVIDER_SITE_OTHER): Payer: Medicare Other | Admitting: Unknown Physician Specialty

## 2016-12-29 ENCOUNTER — Encounter: Payer: Self-pay | Admitting: Unknown Physician Specialty

## 2016-12-29 VITALS — BP 139/74 | HR 60 | Temp 97.9°F | Wt 178.2 lb

## 2016-12-29 DIAGNOSIS — E119 Type 2 diabetes mellitus without complications: Secondary | ICD-10-CM | POA: Diagnosis not present

## 2016-12-29 DIAGNOSIS — E782 Mixed hyperlipidemia: Secondary | ICD-10-CM

## 2016-12-29 DIAGNOSIS — I10 Essential (primary) hypertension: Secondary | ICD-10-CM | POA: Diagnosis not present

## 2016-12-29 LAB — BAYER DCA HB A1C WAIVED: HB A1C (BAYER DCA - WAIVED): 6.9 % (ref <7.0)

## 2016-12-29 NOTE — Assessment & Plan Note (Signed)
Hgb A1C 6.9%.  Discussed walking more as this is an increase

## 2016-12-29 NOTE — Assessment & Plan Note (Signed)
Check lipid panel  

## 2016-12-29 NOTE — Assessment & Plan Note (Signed)
Stable, continue present medications.   

## 2016-12-29 NOTE — Progress Notes (Signed)
BP 139/74 (BP Location: Left Arm, Cuff Size: Large)   Pulse 60   Temp 97.9 F (36.6 C)   Wt 178 lb 3.2 oz (80.8 kg)   SpO2 97%   BMI 25.21 kg/m    Subjective:    Patient ID: Luis Wise, male    DOB: 05/02/1940, 76 y.o.   MRN: [id number]  HPI: Luis Wise is a 76 y.o. male  Chief Complaint  Patient presents with  . Diabetes  . Hyperlipidemia  . Hypertension   Diabetes: Using medications without difficulties No hypoglycemic episodes No hyperglycemic episodes Feet problems: none Blood Sugars averaging: Not checking eye exam within last year Last Hgb A1C: 6.9%  Hypertension  Using medications without difficulty Average home BPs Not checking   Using medication without problems or lightheadedness No chest pain with exertion or shortness of breath No Edema  Elevated Cholesterol Using medications without problems No Muscle aches  Diet: Exercise: No changes but admits to not walking quite as much  Relevant past medical, surgical, family and social history reviewed and updated as indicated. Interim medical history since our last visit reviewed. Allergies and medications reviewed and updated.  Review of Systems  Per HPI unless specifically indicated above     Objective:    BP 139/74 (BP Location: Left Arm, Cuff Size: Large)   Pulse 60   Temp 97.9 F (36.6 C)   Wt 178 lb 3.2 oz (80.8 kg)   SpO2 97%   BMI 25.21 kg/m   Wt Readings from Last 3 Encounters:  12/29/16 178 lb 3.2 oz (80.8 kg)  09/14/16 172 lb (78 kg)  08/17/16 171 lb (77.6 kg)    Physical Exam  Constitutional: He is oriented to person, place, and time. He appears well-developed and well-nourished. No distress.  HENT:  Head: Normocephalic and atraumatic.  Eyes: Conjunctivae and lids are normal. Right eye exhibits no discharge. Left eye exhibits no discharge. No scleral icterus.  Neck: Normal range of motion. Neck supple. No JVD present. Carotid bruit is not present.  Cardiovascular: Normal rate,  regular rhythm and normal heart sounds.   Pulmonary/Chest: Effort normal and breath sounds normal. No respiratory distress.  Abdominal: Normal appearance. There is no splenomegaly or hepatomegaly.  Musculoskeletal: Normal range of motion.  Neurological: He is alert and oriented to person, place, and time.  Skin: Skin is warm, dry and intact. No rash noted. No pallor.  Psychiatric: He has a normal mood and affect. His behavior is normal. Judgment and thought content normal.   Diabetic Foot Exam - Simple   Simple Foot Form Diabetic Foot exam was performed with the following findings:  Yes 12/29/2016 11:11 AM  Visual Inspection No deformities, no ulcerations, no other skin breakdown bilaterally:  Yes Sensation Testing Intact to touch and monofilament testing bilaterally:  Yes Pulse Check Posterior Tibialis and Dorsalis pulse intact bilaterally:  Yes Comments     Results for orders placed or performed in visit on 09/14/16  Bayer DCA Hb A1c Waived  Result Value Ref Range   Bayer DCA Hb A1c Waived 6.5 <7.0 %      Assessment & Plan:   Problem List Items Addressed This Visit      Unprioritized   Benign essential HTN - Primary    Stable, continue present medications.        Relevant Orders   Comprehensive metabolic panel   Diabetes mellitus (HCC)    Hgb A1C 6.9%.  Discussed walking more as this is an increase  Relevant Orders   Bayer DCA Hb A1c Waived   Comprehensive metabolic panel   Hyperlipemia    Check lipid panel      Relevant Orders   Lipid Panel w/o Chol/HDL Ratio       Follow up plan: Return in about 6 months (around 06/29/2017).

## 2016-12-30 LAB — LIPID PANEL W/O CHOL/HDL RATIO
Cholesterol, Total: 116 mg/dL (ref 100–199)
HDL: 25 mg/dL — ABNORMAL LOW (ref >39)
LDL Calculated: 51 mg/dL (ref 0–99)
Triglycerides: 198 mg/dL — ABNORMAL HIGH (ref 0–149)
VLDL Cholesterol Cal: 40 mg/dL (ref 5–40)

## 2016-12-30 LAB — COMPREHENSIVE METABOLIC PANEL
ALT: 24 IU/L (ref 0–44)
AST: 21 IU/L (ref 0–40)
Albumin/Globulin Ratio: 2.1 (ref 1.2–2.2)
Albumin: 4.8 g/dL (ref 3.5–4.8)
Alkaline Phosphatase: 41 IU/L (ref 39–117)
BUN/Creatinine Ratio: 17 (ref 10–24)
BUN: 20 mg/dL (ref 8–27)
Bilirubin Total: 0.3 mg/dL (ref 0.0–1.2)
CO2: 24 mmol/L (ref 20–29)
Calcium: 10.1 mg/dL (ref 8.6–10.2)
Chloride: 99 mmol/L (ref 96–106)
Creatinine, Ser: 1.17 mg/dL (ref 0.76–1.27)
GFR calc Af Amer: 71 mL/min/{1.73_m2} (ref >59)
GFR calc non Af Amer: 61 mL/min/{1.73_m2} (ref >59)
Globulin, Total: 2.3 g/dL (ref 1.5–4.5)
Glucose: 204 mg/dL — ABNORMAL HIGH (ref 65–99)
Potassium: 4.3 mmol/L (ref 3.5–5.2)
Sodium: 140 mmol/L (ref 134–144)
Total Protein: 7.1 g/dL (ref 6.0–8.5)

## 2016-12-30 NOTE — Progress Notes (Signed)
Notified pt by mychart

## 2017-05-04 ENCOUNTER — Encounter: Payer: Self-pay | Admitting: Unknown Physician Specialty

## 2017-05-07 ENCOUNTER — Encounter: Payer: Self-pay | Admitting: Unknown Physician Specialty

## 2017-05-27 ENCOUNTER — Encounter: Payer: Self-pay | Admitting: Family Medicine

## 2017-05-27 ENCOUNTER — Telehealth: Payer: Self-pay | Admitting: Unknown Physician Specialty

## 2017-05-27 NOTE — Telephone Encounter (Signed)
sched 4/3

## 2017-05-27 NOTE — Telephone Encounter (Signed)
Spoke with patient. He will do both physicals together.

## 2017-05-27 NOTE — Telephone Encounter (Signed)
Luis Wise, can Mr. Luis SlickHarmon do his Flight Physical on Wed 4/10 instead of 6/11? He was concerned about the timing of that in June as it may take a month or so for it to be received by the [facility] [employer] Medical Examiner that needs his [employer] Medical physical info. Could you please call him to discuss? [phone] Thank you, Samara DeistKathryn

## 2017-05-27 NOTE — Telephone Encounter (Signed)
Copied from CRM [phone]#[id number]. Topic: Medicare AWV >> May 27, 2017  9:39 AM Waldemar DickensBrown, Kathryn N wrote: Called to schedule AWV with Nurse Health Advisor. Eligible for AWV-I per [employer] as of 08/07/16 g0438 schedule anytime with nurse tiffany   Thank you! For questions please contact: Manuela SchwartzKathryn Brown [phone]  Skype [email or web address] [email or web address]

## 2017-05-27 NOTE — Telephone Encounter (Signed)
Copied from CRM #[id number]. Topic: Medicare AWV °>> May 27, 2017  9:39 AM Brown, Kathryn N wrote: °Called to schedule AWV with Nurse Health Advisor. Eligible for AWV-I per [employer] as of 08/07/16 g0438 schedule anytime with nurse tiffany  ° °Thank you! °For questions please contact: °Kathryn Brown °[phone]  °Skype [email or web address]  ° ° °

## 2017-05-28 ENCOUNTER — Encounter: Payer: Self-pay | Admitting: Unknown Physician Specialty

## 2017-06-09 ENCOUNTER — Encounter: Payer: Self-pay | Admitting: Family Medicine

## 2017-06-09 ENCOUNTER — Ambulatory Visit (INDEPENDENT_AMBULATORY_CARE_PROVIDER_SITE_OTHER): Payer: Medicare Other

## 2017-06-09 VITALS — BP 142/70 | HR 60 | Temp 97.8°F | Resp 16 | Ht 70.0 in | Wt 174.4 lb

## 2017-06-09 DIAGNOSIS — I10 Essential (primary) hypertension: Secondary | ICD-10-CM

## 2017-06-09 DIAGNOSIS — E119 Type 2 diabetes mellitus without complications: Secondary | ICD-10-CM | POA: Diagnosis not present

## 2017-06-09 DIAGNOSIS — E782 Mixed hyperlipidemia: Secondary | ICD-10-CM | POA: Diagnosis not present

## 2017-06-09 DIAGNOSIS — Z0000 Encounter for general adult medical examination without abnormal findings: Secondary | ICD-10-CM

## 2017-06-09 DIAGNOSIS — N400 Enlarged prostate without lower urinary tract symptoms: Secondary | ICD-10-CM

## 2017-06-09 LAB — URINALYSIS, ROUTINE W REFLEX MICROSCOPIC
Bilirubin, UA: NEGATIVE
Glucose, UA: NEGATIVE
Ketones, UA: NEGATIVE
Leukocytes, UA: NEGATIVE
Nitrite, UA: NEGATIVE
RBC, UA: NEGATIVE
Specific Gravity, UA: 1.025 (ref 1.005–1.030)
Urobilinogen, Ur: 0.2 mg/dL (ref 0.2–1.0)
pH, UA: 5 (ref 5.0–7.5)

## 2017-06-09 LAB — BAYER DCA HB A1C WAIVED: HB A1C (BAYER DCA - WAIVED): 6.8 % (ref <7.0)

## 2017-06-09 LAB — MICROSCOPIC EXAMINATION
Bacteria, UA: NONE SEEN
RBC, UA: NONE SEEN /hpf (ref 0–2)

## 2017-06-09 MED ORDER — GLUCOSE BLOOD VI STRP: ORAL_STRIP | 12 refills | Status: DC

## 2017-06-09 NOTE — Patient Instructions (Addendum)
Luis Wise , Thank you for taking time to come for your Medicare Wellness Visit. I appreciate your ongoing commitment to your health goals. Please review the following plan we discussed and let me know if I can assist you in the future.   Screening recommendations/referrals: Colonoscopy: completed  Recommended yearly ophthalmology/optometry visit for glaucoma screening and checkup Recommended yearly dental visit for hygiene and checkup  Vaccinations: Influenza vaccine: up to date Pneumococcal vaccine: up to date Tdap vaccine: due, check with your insurance company for coverage  Shingles vaccine: up to date    Advanced directives: Please bring a copy of your health care power of attorney and living will to the office at your convenience.  Conditions/risks identified: none   Next appointment: Follow up in one year for your annual wellness exam.   Preventive Care 65 Years and Older, Male Preventive care refers to lifestyle choices and visits with your health care provider that can promote health and wellness. What does preventive care include?  A yearly physical exam. This is also called an annual well check.  Dental exams once or twice a year.  Routine eye exams. Ask your health care provider how often you should have your eyes checked.  Personal lifestyle choices, including:  Daily care of your teeth and gums.  Regular physical activity.  Eating a healthy diet.  Avoiding tobacco and drug use.  Limiting alcohol use.  Practicing safe sex.  Taking low doses of aspirin every day.  Taking vitamin and mineral supplements as recommended by your health care provider. What happens during an annual well check? The services and screenings done by your health care provider during your annual well check will depend on your age, overall health, lifestyle risk factors, and family history of disease. Counseling  Your health care provider may ask you questions about your:  Alcohol  use.  Tobacco use.  Drug use.  Emotional well-being.  Home and relationship well-being.  Sexual activity.  Eating habits.  History of falls.  Memory and ability to understand (cognition).  Work and work Astronomerenvironment. Screening  You may have the following tests or measurements:  Height, weight, and BMI.  Blood pressure.  Lipid and cholesterol levels. These may be checked every 5 years, or more frequently if you are over 625 years old.  Skin check.  Lung cancer screening. You may have this screening every year starting at age 75 if you have a 30-pack-year history of smoking and currently smoke or have quit within the past 15 years.  Fecal occult blood test (FOBT) of the stool. You may have this test every year starting at age 75.  Flexible sigmoidoscopy or colonoscopy. You may have a sigmoidoscopy every 5 years or a colonoscopy every 10 years starting at age 75.  Prostate cancer screening. Recommendations will vary depending on your family history and other risks.  Hepatitis C blood test.  Hepatitis B blood test.  Sexually transmitted disease (STD) testing.  Diabetes screening. This is done by checking your blood sugar (glucose) after you have not eaten for a while (fasting). You may have this done every 1-3 years.  Abdominal aortic aneurysm (AAA) screening. You may need this if you are a current or former smoker.  Osteoporosis. You may be screened starting at age 75 if you are at high risk. Talk with your health care provider about your test results, treatment options, and if necessary, the need for more tests. Vaccines  Your health care provider may recommend certain vaccines, such  as:  Influenza vaccine. This is recommended every year.  Tetanus, diphtheria, and acellular pertussis (Tdap, Td) vaccine. You may need a Td booster every 10 years.  Zoster vaccine. You may need this after age 11.  Pneumococcal 13-valent conjugate (PCV13) vaccine. One dose is  recommended after age 36.  Pneumococcal polysaccharide (PPSV23) vaccine. One dose is recommended after age 64. Talk to your health care provider about which screenings and vaccines you need and how often you need them. This information is not intended to replace advice given to you by your health care provider. Make sure you discuss any questions you have with your health care provider. Document Released: 03/22/2015 Document Revised: 11/13/2015 Document Reviewed: 12/25/2014 Elsevier Interactive Patient Education  2017 [place] Prevention in the Home Falls can cause injuries. They can happen to people of all ages. There are many things you can do to make your home safe and to help prevent falls. What can I do on the outside of my home?  Regularly fix the edges of walkways and driveways and fix any cracks.  Remove anything that might make you trip as you walk through a door, such as a raised step or threshold.  Trim any bushes or trees on the path to your home.  Use bright outdoor lighting.  Clear any walking paths of anything that might make someone trip, such as rocks or tools.  Regularly check to see if handrails are loose or broken. Make sure that both sides of any steps have handrails.  Any raised decks and porches should have guardrails on the edges.  Have any leaves, snow, or ice cleared regularly.  Use sand or salt on walking paths during winter.  Clean up any spills in your garage right away. This includes oil or grease spills. What can I do in the bathroom?  Use night lights.  Install grab bars by the toilet and in the tub and shower. Do not use towel bars as grab bars.  Use non-skid mats or decals in the tub or shower.  If you need to sit down in the shower, use a plastic, non-slip stool.  Keep the floor dry. Clean up any water that spills on the floor as soon as it happens.  Remove soap buildup in the tub or shower regularly.  Attach bath mats  securely with double-sided non-slip rug tape.  Do not have throw rugs and other things on the floor that can make you trip. What can I do in the bedroom?  Use night lights.  Make sure that you have a light by your bed that is easy to reach.  Do not use any sheets or blankets that are too big for your bed. They should not hang down onto the floor.  Have a firm chair that has side arms. You can use this for support while you get dressed.  Do not have throw rugs and other things on the floor that can make you trip. What can I do in the kitchen?  Clean up any spills right away.  Avoid walking on wet floors.  Keep items that you use a lot in easy-to-reach places.  If you need to reach something above you, use a strong step stool that has a grab bar.  Keep electrical cords out of the way.  Do not use floor polish or wax that makes floors slippery. If you must use wax, use non-skid floor wax.  Do not have throw rugs and other things on the  floor that can make you trip. What can I do with my stairs?  Do not leave any items on the stairs.  Make sure that there are handrails on both sides of the stairs and use them. Fix handrails that are broken or loose. Make sure that handrails are as long as the stairways.  Check any carpeting to make sure that it is firmly attached to the stairs. Fix any carpet that is loose or worn.  Avoid having throw rugs at the top or bottom of the stairs. If you do have throw rugs, attach them to the floor with carpet tape.  Make sure that you have a light switch at the top of the stairs and the bottom of the stairs. If you do not have them, ask someone to add them for you. What else can I do to help prevent falls?  Wear shoes that:  Do not have high heels.  Have rubber bottoms.  Are comfortable and fit you well.  Are closed at the toe. Do not wear sandals.  If you use a stepladder:  Make sure that it is fully opened. Do not climb a closed  stepladder.  Make sure that both sides of the stepladder are locked into place.  Ask someone to hold it for you, if possible.  Clearly mark and make sure that you can see:  Any grab bars or handrails.  First and last steps.  Where the edge of each step is.  Use tools that help you move around (mobility aids) if they are needed. These include:  Canes.  Walkers.  Scooters.  Crutches.  Turn on the lights when you go into a dark area. Replace any light bulbs as soon as they burn out.  Set up your furniture so you have a clear path. Avoid moving your furniture around.  If any of your floors are uneven, fix them.  If there are any pets around you, be aware of where they are.  Review your medicines with your doctor. Some medicines can make you feel dizzy. This can increase your chance of falling. Ask your doctor what other things that you can do to help prevent falls. This information is not intended to replace advice given to you by your health care provider. Make sure you discuss any questions you have with your health care provider. Document Released: 12/20/2008 Document Revised: 08/01/2015 Document Reviewed: 03/30/2014 Elsevier Interactive Patient Education  2017 [employer].

## 2017-06-09 NOTE — Progress Notes (Signed)
Subjective:   Luis Wise is a 77 y.o. male who presents for Medicare Annual/Subsequent preventive examination.  Review of Systems:   Cardiac Risk Factors include: hypertension;advanced age (>65men, >45 women);male gender;diabetes mellitus;dyslipidemia     Objective:    Vitals: BP (!) 142/70 (BP Location: Left Arm, Patient Position: Sitting)   Pulse 60   Temp 97.8 F (36.6 C) (Temporal)   Resp 16   Ht 5\' 10"  (1.778 m)   Wt 174 lb 6.4 oz (79.1 kg)   BMI 25.02 kg/m   Body mass index is 25.02 kg/m.  No flowsheet data found.  Tobacco Social History   Tobacco Use  Smoking Status Never Smoker  Smokeless Tobacco Never Used     Counseling given: Not Answered   Clinical Intake:  Pre-visit preparation completed: Yes  Pain : No/denies pain     Nutritional Status: BMI 25 -29 Overweight Nutritional Risks: None Diabetes: Yes CBG done?: No Did pt. bring in CBG monitor from home?: No  How often do you need to have someone help you when you read instructions, pamphlets, or other written materials from your doctor or pharmacy?: 1 - Never What is the last grade level you completed in school?: masters degree  Interpreter Needed?: No  Information entered by :: Tishia Maestre,LPN   Past Medical History:  Diagnosis Date  . Diabetes mellitus without complication (HCC)   . Hyperlipidemia   . Hypertension    Past Surgical History:  Procedure Laterality Date  . LAMINECTOMY  1993   L5-S1   Family History  Problem Relation Age of Onset  . Hypertension Mother   . Cancer Neg Hx   . COPD Neg Hx   . Diabetes Neg Hx   . Heart disease Neg Hx   . Hyperlipidemia Neg Hx   . Stroke Neg Hx    Social History   Socioeconomic History  . Marital status: Married    Spouse name: Not on file  . Number of children: Not on file  . Years of education: Not on file  . Highest education level: Not on file  Occupational History  . Not on file  Social Needs  . Financial resource  strain: Not hard at all  . Food insecurity:    Worry: Never true    Inability: Never true  . Transportation needs:    Medical: No    Non-medical: No  Tobacco Use  . Smoking status: Never Smoker  . Smokeless tobacco: Never Used  Substance and Sexual Activity  . Alcohol use: Yes    Comment: rare- occasionaly beer or wine   . Drug use: No  . Sexual activity: Not on file  Lifestyle  . Physical activity:    Days per week: 2 days    Minutes per session: 60 min  . Stress: Not at all  Relationships  . Social connections:    Talks on phone: More than three times a week    Gets together: More than three times a week    Attends religious service: More than 4 times per year    Active member of club or organization: Yes    Attends meetings of clubs or organizations: More than 4 times per year    Relationship status: Married  Other Topics Concern  . Not on file  Social History Narrative  . Not on file    Outpatient Encounter Medications as of 06/09/2017  Medication Sig  . amLODipine (NORVASC) 10 MG tablet Take 1 tablet (10 mg  total) by mouth daily.  . fenofibrate 160 MG tablet Take 1 tablet (160 mg total) by mouth daily.  Marland Kitchen glimepiride (AMARYL) 4 MG tablet Take 4 mg by mouth daily with breakfast.  . lisinopril (PRINIVIL,ZESTRIL) 20 MG tablet Take 1 tablet (20 mg total) by mouth daily.  Marland Kitchen lisinopril-hydrochlorothiazide (PRINZIDE,ZESTORETIC) 20-12.5 MG tablet Take 1 tablet by mouth daily.  . metFORMIN (GLUCOPHAGE) 1000 MG tablet Take 1 tablet (1,000 mg total) by mouth 2 (two) times daily.  . metoprolol tartrate (LOPRESSOR) 50 MG tablet Take 1 tablet (50 mg total) by mouth 2 (two) times daily.  . pravastatin (PRAVACHOL) 20 MG tablet Take 1 tablet (20 mg total) by mouth daily.  . sitaGLIPtin (JANUVIA) 100 MG tablet Take 1 tablet (100 mg total) by mouth daily.  . traZODone (DESYREL) 50 MG tablet Take 1 tablet (50 mg total) by mouth daily.  Marland Kitchen glucose blood (ONE TOUCH ULTRA TEST) test strip  Use as instructed   No facility-administered encounter medications on file as of 06/09/2017.     Activities of Daily Living In your present state of health, do you have any difficulty performing the following activities: 06/09/2017  Hearing? N  Vision? N  Difficulty concentrating or making decisions? N  Walking or climbing stairs? N  Dressing or bathing? N  Doing errands, shopping? N  Preparing Food and eating ? N  Using the Toilet? N  In the past six months, have you accidently leaked urine? N  Do you have problems with loss of bowel control? N  Managing your Medications? N  Managing your Finances? N  Housekeeping or managing your Housekeeping? N  Some recent data might be hidden    Patient Care Team: Gabriel Cirri, NP as PCP - General (Nurse Practitioner)   Assessment:   This is a routine wellness examination for Luis Wise.  Exercise Activities and Dietary recommendations Current Exercise Habits: Home exercise routine, Type of exercise: walking, Time (Minutes): 60, Frequency (Times/Week): 2, Weekly Exercise (Minutes/Week): 120, Intensity: Moderate, Exercise limited by: None identified  Goals    None      Fall Risk Fall Risk  06/09/2017 06/09/2017 09/14/2016 06/08/2016  Falls in the past year? No Yes No No   Is the patient's home free of loose throw rugs in walkways, pet beds, electrical cords, etc?   yes      Grab bars in the bathroom? no      Handrails on the stairs?   yes      Adequate lighting?   yes  Timed Get Up and Go Performed: Completed in 8 seconds with no use of assistive devices, steady gait. No intervention needed at this time.   Depression Screen PHQ 2/9 Scores 06/09/2017 09/14/2016 06/08/2016  PHQ - 2 Score 0 0 0    Cognitive Function     6CIT Screen 06/09/2017  What Year? 0 points  What month? 0 points  What time? 0 points  Count back from 20 0 points  Months in reverse 0 points  Repeat phrase 2 points  Total Score 2    Immunization History  Administered  Date(s) Administered  . Influenza, High Dose Seasonal PF 12/25/2016  . Influenza-Unspecified 12/17/2015  . Pneumococcal Conjugate-13 03/09/2013  . Pneumococcal Polysaccharide-23 03/09/2012, 03/09/2014    Qualifies for Shingles Vaccine? Up to date   Screening Tests Health Maintenance  Topic Date Due  . OPHTHALMOLOGY EXAM  12/10/2016  . TETANUS/TDAP  06/29/2017 (Originally 02/14/1960)  . HEMOGLOBIN A1C  06/29/2017  . INFLUENZA VACCINE  10/07/2017  . FOOT EXAM  12/29/2017  . COLONOSCOPY  03/10/2023  . PNA vac Low Risk Adult  Completed   Cancer Screenings: Lung: Low Dose CT Chest recommended if Age 27-80 years, 30 pack-year currently smoking OR have quit w/in 15years. Patient does not qualify. Colorectal:  Completed 03/09/2013  Additional Screenings:  Hepatitis C Screening: not indicated      Plan:    I have personally reviewed and addressed the Medicare Annual Wellness questionnaire and have noted the following in the patient's chart:  A. Medical and social history B. Use of alcohol, tobacco or illicit drugs  C. Current medications and supplements D. Functional ability and status E.  Nutritional status F.  Physical activity G. Advance directives H. List of other physicians I.  Hospitalizations, surgeries, and ER visits in previous 12 months J.  Vitals K. Screenings such as hearing and vision if needed, cognitive and depression L. Referrals and appointments   In addition, I have reviewed and discussed with patient certain preventive protocols, quality metrics, and best practice recommendations. A written personalized care plan for preventive services as well as general preventive health recommendations were provided to patient.   Signed,  Marin Robertsiffany Emidio Warrell, LPN Nurse Health Advisor   Nurse Notes:needs refill on onetouch lancets   Near vision screening 20/15 with correction

## 2017-06-10 ENCOUNTER — Encounter: Payer: Self-pay | Admitting: Family Medicine

## 2017-06-10 LAB — LIPID PANEL W/O CHOL/HDL RATIO
Cholesterol, Total: 137 mg/dL (ref 100–199)
HDL: 31 mg/dL — ABNORMAL LOW (ref >39)
LDL Calculated: 77 mg/dL (ref 0–99)
Triglycerides: 143 mg/dL (ref 0–149)
VLDL Cholesterol Cal: 29 mg/dL (ref 5–40)

## 2017-06-10 LAB — CBC WITH DIFFERENTIAL/PLATELET
Basophils Absolute: 0.1 10*3/uL (ref 0.0–0.2)
Basos: 1 %
EOS (ABSOLUTE): 0.1 10*3/uL (ref 0.0–0.4)
Eos: 2 %
Hematocrit: 41.2 % (ref 37.5–51.0)
Hemoglobin: 14.6 g/dL (ref 13.0–17.7)
Immature Grans (Abs): 0 10*3/uL (ref 0.0–0.1)
Immature Granulocytes: 0 %
Lymphocytes Absolute: 1 10*3/uL (ref 0.7–3.1)
Lymphs: 19 %
MCH: 28.1 pg (ref 26.6–33.0)
MCHC: 35.4 g/dL (ref 31.5–35.7)
MCV: 79 fL (ref 79–97)
Monocytes Absolute: 0.5 10*3/uL (ref 0.1–0.9)
Monocytes: 10 %
Neutrophils Absolute: 3.6 10*3/uL (ref 1.4–7.0)
Neutrophils: 68 %
Platelets: 282 10*3/uL (ref 150–379)
RBC: 5.19 x10E6/uL (ref 4.14–5.80)
RDW: 13.8 % (ref 12.3–15.4)
WBC: 5.3 10*3/uL (ref 3.4–10.8)

## 2017-06-10 LAB — COMPREHENSIVE METABOLIC PANEL
ALT: 21 IU/L (ref 0–44)
AST: 20 IU/L (ref 0–40)
Albumin/Globulin Ratio: 2.2 (ref 1.2–2.2)
Albumin: 5 g/dL — ABNORMAL HIGH (ref 3.5–4.8)
Alkaline Phosphatase: 47 IU/L (ref 39–117)
BUN/Creatinine Ratio: 17 (ref 10–24)
BUN: 20 mg/dL (ref 8–27)
Bilirubin Total: 0.4 mg/dL (ref 0.0–1.2)
CO2: 22 mmol/L (ref 20–29)
Calcium: 9.8 mg/dL (ref 8.6–10.2)
Chloride: 101 mmol/L (ref 96–106)
Creatinine, Ser: 1.16 mg/dL (ref 0.76–1.27)
GFR calc Af Amer: 71 mL/min/{1.73_m2} (ref >59)
GFR calc non Af Amer: 62 mL/min/{1.73_m2} (ref >59)
Globulin, Total: 2.3 g/dL (ref 1.5–4.5)
Glucose: 138 mg/dL — ABNORMAL HIGH (ref 65–99)
Potassium: 4.1 mmol/L (ref 3.5–5.2)
Sodium: 140 mmol/L (ref 134–144)
Total Protein: 7.3 g/dL (ref 6.0–8.5)

## 2017-06-10 LAB — TSH: TSH: 0.993 u[IU]/mL (ref 0.450–4.500)

## 2017-06-10 LAB — PSA: Prostate Specific Ag, Serum: 1.6 ng/mL (ref 0.0–4.0)

## 2017-06-16 ENCOUNTER — Ambulatory Visit (INDEPENDENT_AMBULATORY_CARE_PROVIDER_SITE_OTHER): Payer: Medicare Other | Admitting: Family Medicine

## 2017-06-16 ENCOUNTER — Encounter: Payer: Self-pay | Admitting: Family Medicine

## 2017-06-16 VITALS — BP 150/80 | HR 80 | Ht 70.25 in | Wt 175.0 lb

## 2017-06-16 DIAGNOSIS — E119 Type 2 diabetes mellitus without complications: Secondary | ICD-10-CM | POA: Diagnosis not present

## 2017-06-16 DIAGNOSIS — I10 Essential (primary) hypertension: Secondary | ICD-10-CM | POA: Diagnosis not present

## 2017-06-16 DIAGNOSIS — N400 Enlarged prostate without lower urinary tract symptoms: Secondary | ICD-10-CM

## 2017-06-16 DIAGNOSIS — E782 Mixed hyperlipidemia: Secondary | ICD-10-CM

## 2017-06-16 DIAGNOSIS — Z7189 Other specified counseling: Secondary | ICD-10-CM | POA: Diagnosis not present

## 2017-06-16 MED ORDER — PRAVASTATIN SODIUM 20 MG PO TABS: 20.0000 mg | ORAL_TABLET | Freq: Every day | ORAL | 4 refills | Status: DC

## 2017-06-16 MED ORDER — GLUCOSE BLOOD VI STRP: ORAL_STRIP | 12 refills | Status: DC

## 2017-06-16 MED ORDER — GLIMEPIRIDE 4 MG PO TABS: 4.0000 mg | ORAL_TABLET | Freq: Every day | ORAL | 4 refills | Status: DC

## 2017-06-16 MED ORDER — TRAZODONE HCL 50 MG PO TABS: 50.0000 mg | ORAL_TABLET | Freq: Every day | ORAL | 3 refills | Status: DC

## 2017-06-16 MED ORDER — LISINOPRIL 20 MG PO TABS: 20.0000 mg | ORAL_TABLET | Freq: Every day | ORAL | 4 refills | Status: DC

## 2017-06-16 MED ORDER — LISINOPRIL-HYDROCHLOROTHIAZIDE 20-25 MG PO TABS: 1.0000 | ORAL_TABLET | Freq: Every day | ORAL | 4 refills | Status: DC

## 2017-06-16 MED ORDER — METOPROLOL TARTRATE 50 MG PO TABS: 50.0000 mg | ORAL_TABLET | Freq: Two times a day (BID) | ORAL | 4 refills | Status: DC

## 2017-06-16 MED ORDER — AMLODIPINE BESYLATE 10 MG PO TABS: 10.0000 mg | ORAL_TABLET | Freq: Every day | ORAL | 4 refills | Status: DC

## 2017-06-16 MED ORDER — METFORMIN HCL 1000 MG PO TABS: 1000.0000 mg | ORAL_TABLET | Freq: Two times a day (BID) | ORAL | 4 refills | Status: DC

## 2017-06-16 MED ORDER — FENOFIBRATE 160 MG PO TABS: 160.0000 mg | ORAL_TABLET | Freq: Every day | ORAL | 4 refills | Status: DC

## 2017-06-16 MED ORDER — SITAGLIPTIN PHOSPHATE 100 MG PO TABS: 100.0000 mg | ORAL_TABLET | Freq: Every day | ORAL | 4 refills | Status: DC

## 2017-06-16 NOTE — Assessment & Plan Note (Signed)
Discuss hypertension poor control and on further chart review is had elevated readings in the past will increase lisinopril will continue 20 mg lisinopril and start lisinopril -hct 20/25

## 2017-06-16 NOTE — Assessment & Plan Note (Signed)
The current medical regimen is effective;  continue present plan and medications.  

## 2017-06-16 NOTE — Assessment & Plan Note (Signed)
A voluntary discussion about advance care planning including the explanation and discussion of advance directives was extensively discussed  with the patient.  Explanation about the health care proxy and Living will was reviewed and packet with forms with explanation of how to fill them out was given.  Time spent: encounter 16+ min       Individuals present: Pt.  

## 2017-06-16 NOTE — Progress Notes (Addendum)
BP (!) 150/80 (BP Location: Left Arm)   Pulse 80   Ht 5' 10.25" (1.784 m)   Wt 175 lb (79.4 kg)   BMI 24.93 kg/m    Subjective:    Patient ID: Luis Wise, male    DOB: 02/17/1941, 77 y.o.   MRN: [id number]  HPI: Luis Wise is a 77 y.o. male  Chief Complaint  Patient presents with  . Flight    Class 2  . Follow-up    Had Medicare AWV. Needs OV to finish visit.   Patient for physical exam. Taking blood pressure medications today and without problems on review has had some elevated readings. Taking cholesterol medicine without problems. No low blood sugar spells or problems does note on some home glucose monitoring that his morning glucoses are higher than during the day. His trazodone on a as needed basis and not within 48 hours of flying.  Relevant past medical, surgical, family and social history reviewed and updated as indicated. Interim medical history since our last visit reviewed. Allergies and medications reviewed and updated.  Review of Systems  Constitutional: Negative.   HENT: Negative.   Eyes: Negative.   Respiratory: Negative.   Cardiovascular: Negative.   Gastrointestinal: Negative.   Endocrine: Negative.   Genitourinary: Negative.   Musculoskeletal: Negative.   Skin: Negative.   Allergic/Immunologic: Negative.   Neurological: Negative.   Hematological: Negative.   Psychiatric/Behavioral: Negative.     Per HPI unless specifically indicated above     Objective:    BP (!) 150/80 (BP Location: Left Arm)   Pulse 80   Ht 5' 10.25" (1.784 m)   Wt 175 lb (79.4 kg)   BMI 24.93 kg/m   Wt Readings from Last 3 Encounters:  06/16/17 175 lb (79.4 kg)  06/09/17 174 lb 6.4 oz (79.1 kg)  12/29/16 178 lb 3.2 oz (80.8 kg)    Physical Exam  Constitutional: He is oriented to person, place, and time. He appears well-developed and well-nourished.  HENT:  Head: Normocephalic and atraumatic.  Right Ear: External ear normal.  Left Ear: External ear normal.    Eyes: Pupils are equal, round, and reactive to light. Conjunctivae and EOM are normal.  Neck: Normal range of motion. Neck supple.  Cardiovascular: Normal rate, regular rhythm, normal heart sounds and intact distal pulses.  Pulmonary/Chest: Effort normal and breath sounds normal.  Abdominal: Soft. Bowel sounds are normal. There is no splenomegaly or hepatomegaly.  Genitourinary: Rectum normal, prostate normal and penis normal.  Musculoskeletal: Normal range of motion.  Neurological: He is alert and oriented to person, place, and time. He has normal reflexes.  Skin: No rash noted. No erythema.  Psychiatric: He has a normal mood and affect. His behavior is normal. Judgment and thought content normal.    Results for orders placed or performed in visit on 06/09/17  Microscopic Examination  Result Value Ref Range   WBC, UA 0-5 0 - 5 /hpf   RBC, UA None seen 0 - 2 /hpf   Epithelial Cells (non renal) CANCELED    Bacteria, UA None seen None seen/Few  CBC with Differential  Result Value Ref Range   WBC 5.3 3.4 - 10.8 x10E3/uL   RBC 5.19 4.14 - 5.80 x10E6/uL   Hemoglobin 14.6 13.0 - 17.7 g/dL   Hematocrit 41.2 37.5 - 51.0 %   MCV 79 79 - 97 fL   MCH 28.1 26.6 - 33.0 pg   MCHC 35.4 31.5 - 35.7 g/dL   RDW 13.8  12.3 - 15.4 %   Platelets 282 150 - 379 x10E3/uL   Neutrophils 68 Not Estab. %   Lymphs 19 Not Estab. %   Monocytes 10 Not Estab. %   Eos 2 Not Estab. %   Basos 1 Not Estab. %   Neutrophils Absolute 3.6 1.4 - 7.0 x10E3/uL   Lymphocytes Absolute 1.0 0.7 - 3.1 x10E3/uL   Monocytes Absolute 0.5 0.1 - 0.9 x10E3/uL   EOS (ABSOLUTE) 0.1 0.0 - 0.4 x10E3/uL   Basophils Absolute 0.1 0.0 - 0.2 x10E3/uL   Immature Granulocytes 0 Not Estab. %   Immature Grans (Abs) 0.0 0.0 - 0.1 x10E3/uL  Comp Met (CMET)  Result Value Ref Range   Glucose 138 (H) 65 - 99 mg/dL   BUN 20 8 - 27 mg/dL   Creatinine, Ser 1.16 0.76 - 1.27 mg/dL   GFR calc non Af Amer 62 >59 mL/min/1.73   GFR calc Af Amer 71  >59 mL/min/1.73   BUN/Creatinine Ratio 17 10 - 24   Sodium 140 134 - 144 mmol/L   Potassium 4.1 3.5 - 5.2 mmol/L   Chloride 101 96 - 106 mmol/L   CO2 22 20 - 29 mmol/L   Calcium 9.8 8.6 - 10.2 mg/dL   Total Protein 7.3 6.0 - 8.5 g/dL   Albumin 5.0 (H) 3.5 - 4.8 g/dL   Globulin, Total 2.3 1.5 - 4.5 g/dL   Albumin/Globulin Ratio 2.2 1.2 - 2.2   Bilirubin Total 0.4 0.0 - 1.2 mg/dL   Alkaline Phosphatase 47 39 - 117 IU/L   AST 20 0 - 40 IU/L   ALT 21 0 - 44 IU/L  Urinalysis, Routine w reflex microscopic  Result Value Ref Range   Specific Gravity, UA 1.025 1.005 - 1.030   pH, UA 5.0 5.0 - 7.5   Color, UA Yellow Yellow   Appearance Ur Hazy (A) Clear   Leukocytes, UA Negative Negative   Protein, UA 1+ (A) Negative/Trace   Glucose, UA Negative Negative   Ketones, UA Negative Negative   RBC, UA Negative Negative   Bilirubin, UA Negative Negative   Urobilinogen, Ur 0.2 0.2 - 1.0 mg/dL   Nitrite, UA Negative Negative   Microscopic Examination See below:   TSH  Result Value Ref Range   TSH 0.993 0.450 - 4.500 uIU/mL  PSA  Result Value Ref Range   Prostate Specific Ag, Serum 1.6 0.0 - 4.0 ng/mL  Lipid Panel w/o Chol/HDL Ratio  Result Value Ref Range   Cholesterol, Total 137 100 - 199 mg/dL   Triglycerides 143 0 - 149 mg/dL   HDL 31 (L) >39 mg/dL   VLDL Cholesterol Cal 29 5 - 40 mg/dL   LDL Calculated 77 0 - 99 mg/dL  Bayer DCA Hb A1c Waived (STAT)  Result Value Ref Range   Bayer DCA Hb A1c Waived 6.8 <7.0 %      Assessment & Plan:   Problem List Items Addressed This Visit      Cardiovascular and Mediastinum   Benign essential HTN    Discuss hypertension poor control and on further chart review is had elevated readings in the past will increase lisinopril will continue 20 mg lisinopril and start lisinopril -hct 20/25      Relevant Medications   pravastatin (PRAVACHOL) 20 MG tablet   metoprolol tartrate (LOPRESSOR) 50 MG tablet   lisinopril (PRINIVIL,ZESTRIL) 20 MG tablet    fenofibrate 160 MG tablet   amLODipine (NORVASC) 10 MG tablet   lisinopril-hydrochlorothiazide (PRINZIDE,ZESTORETIC) 20-25  MG tablet     Endocrine   Diabetes mellitus ([place])    The current medical regimen is effective;  continue present plan and medications.       Relevant Medications   sitaGLIPtin (JANUVIA) 100 MG tablet   pravastatin (PRAVACHOL) 20 MG tablet   metFORMIN (GLUCOPHAGE) 1000 MG tablet   lisinopril (PRINIVIL,ZESTRIL) 20 MG tablet   glucose blood (ONE TOUCH ULTRA TEST) test strip   glimepiride (AMARYL) 4 MG tablet   lisinopril-hydrochlorothiazide (PRINZIDE,ZESTORETIC) 20-25 MG tablet     Genitourinary   BPH (benign prostatic hyperplasia)    The current medical regimen is effective;  continue present plan and medications.         Other   Hyperlipemia    The current medical regimen is effective;  continue present plan and medications.       Relevant Medications   pravastatin (PRAVACHOL) 20 MG tablet   metoprolol tartrate (LOPRESSOR) 50 MG tablet   lisinopril (PRINIVIL,ZESTRIL) 20 MG tablet   fenofibrate 160 MG tablet   amLODipine (NORVASC) 10 MG tablet   lisinopril-hydrochlorothiazide (PRINZIDE,ZESTORETIC) 20-25 MG tablet   Advanced care planning/counseling discussion - Primary    A voluntary discussion about advance care planning including the explanation and discussion of advance directives was extensively discussed  with the patient.  Explanation about the health care proxy and Living will was reviewed and packet with forms with explanation of how to fill them out was given.    Time spent: encounter 16+ min       Individuals present.Pt.           Follow up plan: Return in about 1 month (around 07/14/2017) for BMP.

## 2017-06-18 DIAGNOSIS — H524 Presbyopia: Secondary | ICD-10-CM | POA: Diagnosis not present

## 2017-06-18 DIAGNOSIS — E089 Diabetes mellitus due to underlying condition without complications: Secondary | ICD-10-CM | POA: Diagnosis not present

## 2017-06-18 DIAGNOSIS — E119 Type 2 diabetes mellitus without complications: Secondary | ICD-10-CM | POA: Diagnosis not present

## 2017-06-18 DIAGNOSIS — H2513 Age-related nuclear cataract, bilateral: Secondary | ICD-10-CM | POA: Diagnosis not present

## 2017-06-21 ENCOUNTER — Encounter: Payer: Self-pay | Admitting: Family Medicine

## 2017-06-21 DIAGNOSIS — E119 Type 2 diabetes mellitus without complications: Secondary | ICD-10-CM

## 2017-06-24 MED ORDER — GLUCOSE BLOOD VI STRP: ORAL_STRIP | 12 refills | Status: DC

## 2017-06-30 ENCOUNTER — Telehealth: Payer: Self-pay | Admitting: Unknown Physician Specialty

## 2017-06-30 DIAGNOSIS — E119 Type 2 diabetes mellitus without complications: Secondary | ICD-10-CM

## 2017-06-30 NOTE — Telephone Encounter (Signed)
Copied from CRM [phone]. Topic: Quick Communication - See Telephone Encounter >> Jun 30, 2017  1:32 PM Cipriano BunkerLambe, Annette S wrote: CRM for notification.   He is out and needs this asap  Pt. Called glucose blood (ONE TOUCH ULTRA TEST) test strip  [facility] and [facility] is to [facility] to   [id number] [address] [place] [place] - [address] [place] [id number] Phone: [phone] Fax: [phone]   See Telephone encounter for: 06/30/17.

## 2017-07-01 MED ORDER — GLUCOSE BLOOD VI STRP: ORAL_STRIP | 12 refills | Status: DC

## 2017-07-01 NOTE — Telephone Encounter (Signed)
[facility] [address] as requested by pt. [facility]

## 2017-07-01 NOTE — Telephone Encounter (Signed)
Patient called in again for this prescription. [facility] [address] [facility] because the first one was incomplete.  [facility] would also [facility] to be [facility] out completely for any new prescriptions.

## 2017-07-19 ENCOUNTER — Encounter: Payer: Self-pay | Admitting: Unknown Physician Specialty

## 2017-07-19 ENCOUNTER — Ambulatory Visit (INDEPENDENT_AMBULATORY_CARE_PROVIDER_SITE_OTHER): Payer: Medicare Other | Admitting: Unknown Physician Specialty

## 2017-07-19 DIAGNOSIS — I10 Essential (primary) hypertension: Secondary | ICD-10-CM

## 2017-07-19 NOTE — Progress Notes (Addendum)
BP 136/74 (BP Location: Left Arm, Cuff Size: Normal)   Pulse 64   Temp 98.6 F (37 C) (Oral)   Ht 5' 10" (1.778 m)   Wt 174 lb 3.2 oz (79 kg)   SpO2 95%   BMI 25.00 kg/m    Subjective:    Patient ID: Luis Wise, male    DOB: 01-04-1941, 77 y.o.   MRN: [id number]  HPI: Luis Wise is a 77 y.o. male  Chief Complaint  Patient presents with  . Hypertension    1 month f/up   Hypertension Last visit pt put on Lisinopril/hctz 20/25 Using medications without difficulty Average home BPs Not checking  No problems or lightheadedness No chest pain with exertion or shortness of breath No Edema  Relevant past medical, surgical, family and social history reviewed and updated as indicated. Interim medical history since our last visit reviewed. Allergies and medications reviewed and updated.  Review of Systems  Per HPI unless specifically indicated above     Objective:    BP 136/74 (BP Location: Left Arm, Cuff Size: Normal)   Pulse 64   Temp 98.6 F (37 C) (Oral)   Ht 5' 10" (1.778 m)   Wt 174 lb 3.2 oz (79 kg)   SpO2 95%   BMI 25.00 kg/m   Wt Readings from Last 3 Encounters:  07/19/17 174 lb 3.2 oz (79 kg)  06/16/17 175 lb (79.4 kg)  06/09/17 174 lb 6.4 oz (79.1 kg)    Physical Exam  Constitutional: He is oriented to person, place, and time. He appears well-developed and well-nourished. No distress.  HENT:  Head: Normocephalic and atraumatic.  Eyes: Conjunctivae and lids are normal. Right eye exhibits no discharge. Left eye exhibits no discharge. No scleral icterus.  Neck: Normal range of motion. Neck supple. No JVD present. Carotid bruit is not present.  Cardiovascular: Normal rate, regular rhythm and normal heart sounds.  Pulmonary/Chest: Effort normal and breath sounds normal. No respiratory distress.  Abdominal: Normal appearance. There is no splenomegaly or hepatomegaly.  Musculoskeletal: Normal range of motion.  Neurological: He is alert and oriented to  person, place, and time.  Skin: Skin is warm, dry and intact. No rash noted. No pallor.  Psychiatric: He has a normal mood and affect. His behavior is normal. Judgment and thought content normal.    Results for orders placed or performed in visit on 06/09/17  Microscopic Examination  Result Value Ref Range   WBC, UA 0-5 0 - 5 /hpf   RBC, UA None seen 0 - 2 /hpf   Epithelial Cells (non renal) CANCELED    Bacteria, UA None seen None seen/Few  CBC with Differential  Result Value Ref Range   WBC 5.3 3.4 - 10.8 x10E3/uL   RBC 5.19 4.14 - 5.80 x10E6/uL   Hemoglobin 14.6 13.0 - 17.7 g/dL   Hematocrit 41.2 37.5 - 51.0 %   MCV 79 79 - 97 fL   MCH 28.1 26.6 - 33.0 pg   MCHC 35.4 31.5 - 35.7 g/dL   RDW 13.8 12.3 - 15.4 %   Platelets 282 150 - 379 x10E3/uL   Neutrophils 68 Not Estab. %   Lymphs 19 Not Estab. %   Monocytes 10 Not Estab. %   Eos 2 Not Estab. %   Basos 1 Not Estab. %   Neutrophils Absolute 3.6 1.4 - 7.0 x10E3/uL   Lymphocytes Absolute 1.0 0.7 - 3.1 x10E3/uL   Monocytes Absolute 0.5 0.1 - 0.9 x10E3/uL  EOS (ABSOLUTE) 0.1 0.0 - 0.4 x10E3/uL   Basophils Absolute 0.1 0.0 - 0.2 x10E3/uL   Immature Granulocytes 0 Not Estab. %   Immature Grans (Abs) 0.0 0.0 - 0.1 x10E3/uL  Comp Met (CMET)  Result Value Ref Range   Glucose 138 (H) 65 - 99 mg/dL   BUN 20 8 - 27 mg/dL   Creatinine, Ser 1.16 0.76 - 1.27 mg/dL   GFR calc non Af Amer 62 >59 mL/min/1.73   GFR calc Af Amer 71 >59 mL/min/1.73   BUN/Creatinine Ratio 17 10 - 24   Sodium 140 134 - 144 mmol/L   Potassium 4.1 3.5 - 5.2 mmol/L   Chloride 101 96 - 106 mmol/L   CO2 22 20 - 29 mmol/L   Calcium 9.8 8.6 - 10.2 mg/dL   Total Protein 7.3 6.0 - 8.5 g/dL   Albumin 5.0 (H) 3.5 - 4.8 g/dL   Globulin, Total 2.3 1.5 - 4.5 g/dL   Albumin/Globulin Ratio 2.2 1.2 - 2.2   Bilirubin Total 0.4 0.0 - 1.2 mg/dL   Alkaline Phosphatase 47 39 - 117 IU/L   AST 20 0 - 40 IU/L   ALT 21 0 - 44 IU/L  Urinalysis, Routine w reflex microscopic    Result Value Ref Range   Specific Gravity, UA 1.025 1.005 - 1.030   pH, UA 5.0 5.0 - 7.5   Color, UA Yellow Yellow   Appearance Ur Hazy (A) Clear   Leukocytes, UA Negative Negative   Protein, UA 1+ (A) Negative/Trace   Glucose, UA Negative Negative   Ketones, UA Negative Negative   RBC, UA Negative Negative   Bilirubin, UA Negative Negative   Urobilinogen, Ur 0.2 0.2 - 1.0 mg/dL   Nitrite, UA Negative Negative   Microscopic Examination See below:   TSH  Result Value Ref Range   TSH 0.993 0.450 - 4.500 uIU/mL  PSA  Result Value Ref Range   Prostate Specific Ag, Serum 1.6 0.0 - 4.0 ng/mL  Lipid Panel w/o Chol/HDL Ratio  Result Value Ref Range   Cholesterol, Total 137 100 - 199 mg/dL   Triglycerides 143 0 - 149 mg/dL   HDL 31 (L) >39 mg/dL   VLDL Cholesterol Cal 29 5 - 40 mg/dL   LDL Calculated 77 0 - 99 mg/dL  Bayer DCA Hb A1c Waived (STAT)  Result Value Ref Range   Bayer DCA Hb A1c Waived 6.8 <7.0 %      Assessment & Plan:   Problem List Items Addressed This Visit      Unprioritized   Benign essential HTN    Initially SBP was 154.  Improved to 136/74 on recheck.  Will continue present treatment.  Encouraged to check on his own.            Follow up plan: Routine f/u with Dr. Crissman     

## 2017-07-19 NOTE — Addendum Note (Signed)
Addended by: Gabriel Cirri on: 07/19/2017 11:25 AM   Modules accepted: Level of Service

## 2017-07-19 NOTE — Assessment & Plan Note (Addendum)
Initially SBP was 154.  Improved to 136/74 on recheck.  Will continue present treatment.  Encouraged to check on his own.

## 2017-07-27 LAB — HM DIABETES EYE EXAM

## 2017-07-28 ENCOUNTER — Encounter: Payer: Medicare Other | Admitting: Family Medicine

## 2017-08-09 ENCOUNTER — Encounter: Payer: Self-pay | Admitting: Family Medicine

## 2017-08-10 ENCOUNTER — Ambulatory Visit: Payer: Medicare Other

## 2017-08-10 VITALS — BP 148/88

## 2017-08-10 DIAGNOSIS — Z029 Encounter for administrative examinations, unspecified: Principal | ICD-10-CM

## 2017-08-10 DIAGNOSIS — Z0289 Encounter for other administrative examinations: Secondary | ICD-10-CM

## 2017-08-10 NOTE — Progress Notes (Signed)
Patient presented to clinic for BP check for FFA exam. First reading on pt's left arm sitting was 170\75. Advised patient to sit and relax, and we would recheck BP in a few minutes.

## 2017-08-11 ENCOUNTER — Ambulatory Visit (INDEPENDENT_AMBULATORY_CARE_PROVIDER_SITE_OTHER): Payer: Medicare Other

## 2017-08-11 VITALS — BP 152/80

## 2017-08-11 DIAGNOSIS — Z0289 Encounter for other administrative examinations: Secondary | ICD-10-CM

## 2017-08-11 DIAGNOSIS — Z029 Encounter for administrative examinations, unspecified: Secondary | ICD-10-CM

## 2017-08-11 NOTE — Progress Notes (Signed)
BP checked on patient's left arm, sitting. 152\80. Provider notified. Patient will come back again tomorrow.

## 2017-08-12 ENCOUNTER — Ambulatory Visit: Payer: Medicare Other

## 2017-08-12 VITALS — BP 141/64 | HR 94

## 2017-08-12 DIAGNOSIS — Z029 Encounter for administrative examinations, unspecified: Principal | ICD-10-CM

## 2017-08-12 DIAGNOSIS — Z0289 Encounter for other administrative examinations: Secondary | ICD-10-CM

## 2017-08-17 ENCOUNTER — Encounter: Payer: Medicare Other | Admitting: Family Medicine

## 2017-08-18 ENCOUNTER — Other Ambulatory Visit: Payer: Self-pay | Admitting: Family Medicine

## 2017-09-06 ENCOUNTER — Encounter: Payer: Self-pay | Admitting: Family Medicine

## 2017-11-24 DIAGNOSIS — E089 Diabetes mellitus due to underlying condition without complications: Secondary | ICD-10-CM | POA: Diagnosis not present

## 2017-11-24 DIAGNOSIS — E119 Type 2 diabetes mellitus without complications: Secondary | ICD-10-CM | POA: Diagnosis not present

## 2017-11-24 DIAGNOSIS — H2513 Age-related nuclear cataract, bilateral: Secondary | ICD-10-CM | POA: Diagnosis not present

## 2017-12-16 DIAGNOSIS — Z23 Encounter for immunization: Secondary | ICD-10-CM | POA: Diagnosis not present

## 2018-01-06 ENCOUNTER — Ambulatory Visit (INDEPENDENT_AMBULATORY_CARE_PROVIDER_SITE_OTHER): Payer: Medicare Other | Admitting: Nurse Practitioner

## 2018-01-06 ENCOUNTER — Encounter: Payer: Self-pay | Admitting: Nurse Practitioner

## 2018-01-06 ENCOUNTER — Telehealth: Payer: Self-pay | Admitting: Nurse Practitioner

## 2018-01-06 DIAGNOSIS — E782 Mixed hyperlipidemia: Secondary | ICD-10-CM

## 2018-01-06 DIAGNOSIS — E119 Type 2 diabetes mellitus without complications: Secondary | ICD-10-CM

## 2018-01-06 DIAGNOSIS — I10 Essential (primary) hypertension: Secondary | ICD-10-CM | POA: Diagnosis not present

## 2018-01-06 NOTE — Telephone Encounter (Signed)
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  He was unsure if the labs could be done at that appointment.  I told him I would send you a message to confer.  Thank You

## 2018-01-06 NOTE — Assessment & Plan Note (Signed)
Chronic, ongoing.  Initial SBP elevated 149 with repeat 132/68.  Continue present treatment regimen.  Does not check BP at home, encouraged him to do so and bring readings to appt.Marland Kitchen

## 2018-01-06 NOTE — Assessment & Plan Note (Signed)
Chronic, stable. Continue current regimen. 

## 2018-01-06 NOTE — Progress Notes (Signed)
BP 132/68 (BP Location: Left Arm, Patient Position: Sitting)   Pulse 60   Temp 98.5 F (36.9 C) (Oral)   Wt 170 lb 9.6 oz (77.4 kg)   SpO2 98%   BMI 24.48 kg/m    Subjective:    Patient ID: Luis Wise, male    DOB: 28-Jan-1941, 77 y.o.   MRN: [id number]  HPI: Ova Meegan is a 77 y.o. male presents for follow-up T2DM, HTN, HLD  Chief Complaint  Patient presents with  . Diabetes  . Hyperlipidemia  . Hypertension   HYPERTENSION / HYPERLIPIDEMIA Satisfied with current treatment? no Duration of hypertension: chronic BP monitoring frequency: not checking BP range: not taking BP at home BP medication side effects: no Past BP meds: not sure Duration of hyperlipidemia: chronic Cholesterol medication side effects: no Cholesterol supplements: fish oil Past cholesterol medications: none Medication compliance: excellent compliance Aspirin: yes Recent stressors: no Recurrent headaches: no Visual changes: no Palpitations: no Dyspnea: no Chest pain: no Lower extremity edema: no Dizzy/lightheaded: no   DIABETES Hypoglycemic episodes:no Polydipsia/polyuria: no Visual disturbance: no Chest pain: no Paresthesias: no Glucose Monitoring: yes  Accucheck frequency: once and awhile  Fasting glucose: 140's  Post prandial:  Evening:  Before meals: Taking Insulin?: no  Long acting insulin:  Short acting insulin: Blood Pressure Monitoring: not checking Retinal Examination: Up to Date Foot Exam: Up to Date Diabetic Education: Completed Pneumovax: Up to Date Influenza: Up to Date Aspirin: yes  Relevant past medical, surgical, family and social history reviewed and updated as indicated. Interim medical history since our last visit reviewed. Allergies and medications reviewed and updated.  Review of Systems  Constitutional: Negative for activity change, appetite change, diaphoresis, fatigue and unexpected weight change.  Respiratory: Negative for cough, chest tightness,  shortness of breath and wheezing.   Cardiovascular: Negative for chest pain, palpitations and leg swelling.  Gastrointestinal: Negative for abdominal distention, abdominal pain, constipation, diarrhea, nausea and vomiting.  Endocrine: Negative for cold intolerance, heat intolerance, polydipsia and polyuria.  Genitourinary: Negative.   Musculoskeletal: Negative for arthralgias and myalgias.  Skin: Negative.   Neurological: Negative for syncope, weakness, light-headedness, numbness and headaches.  Psychiatric/Behavioral: Negative.    Per HPI unless specifically indicated above     Objective:    BP 132/68 (BP Location: Left Arm, Patient Position: Sitting)   Pulse 60   Temp 98.5 F (36.9 C) (Oral)   Wt 170 lb 9.6 oz (77.4 kg)   SpO2 98%   BMI 24.48 kg/m   Wt Readings from Last 3 Encounters:  01/06/18 170 lb 9.6 oz (77.4 kg)  07/19/17 174 lb 3.2 oz (79 kg)  06/16/17 175 lb (79.4 kg)    Physical Exam  Constitutional: He is oriented to person, place, and time. He appears well-developed and well-nourished.  HENT:  Head: Normocephalic and atraumatic.  Eyes: Pupils are equal, round, and reactive to light. Conjunctivae and EOM are normal. Right eye exhibits no discharge. Left eye exhibits no discharge.  Neck: Normal range of motion. Neck supple. No JVD present. Carotid bruit is not present. No thyromegaly present.  Cardiovascular: Normal rate, regular rhythm, normal heart sounds and intact distal pulses.  Pulses:      Dorsalis pedis pulses are 2+ on the right side, and 2+ on the left side.       Posterior tibial pulses are 2+ on the right side, and 2+ on the left side.  Pulmonary/Chest: Effort normal and breath sounds normal.  Abdominal: Soft. Bowel sounds are normal.  Genitourinary: Rectum normal and penis normal.  Musculoskeletal: Normal range of motion.  Feet:  Right Foot:  Protective Sensation: 10 sites tested. 10 sites sensed.  Left Foot:  Protective Sensation: 10 sites  tested. 10 sites sensed.  Lymphadenopathy:    He has no cervical adenopathy.  Neurological: He is alert and oriented to person, place, and time. He has normal reflexes.  Reflex Scores:      Brachioradialis reflexes are 2+ on the right side and 2+ on the left side.      Patellar reflexes are 2+ on the right side and 2+ on the left side. Skin: Skin is warm and dry. No rash noted.  Psychiatric: He has a normal mood and affect. His behavior is normal.   Diabetic Foot Exam - Simple   Simple Foot Form Visual Inspection No deformities, no ulcerations, no other skin breakdown bilaterally:  Yes Sensation Testing Intact to touch and monofilament testing bilaterally:  Yes Pulse Check Posterior Tibialis and Dorsalis pulse intact bilaterally:  Yes Comments Dry skin bilateral feet.      Results for orders placed or performed in visit on 08/03/17  HM DIABETES EYE EXAM  Result Value Ref Range   HM Diabetic Eye Exam No Retinopathy No Retinopathy      Assessment & Plan:   Problem List Items Addressed This Visit      Cardiovascular and Mediastinum   Benign essential HTN    Chronic, ongoing.  Initial SBP elevated 149 with repeat 132/68.  Continue present treatment regimen.  Does not check BP at home, encouraged him to do so and bring readings to appt..        Endocrine   Diabetes mellitus ([facility])    Chronic, well-controlled on current regimen.  Continue current doses.  A1C 6.8% today, same as previous.      Relevant Orders   Bayer DCA Hb A1c Waived (STAT)     Other   Hyperlipemia    Chronic, stable.  Continue current regimen.          Follow up plan: Return in about 6 months (around 07/07/2018) for T2DM and HTN/HLD (needs lipid panel and A1C).

## 2018-01-06 NOTE — Assessment & Plan Note (Addendum)
Chronic, well-controlled on current regimen.  Continue current doses.  A1C 6.8% today, same as previous.

## 2018-01-06 NOTE — Telephone Encounter (Signed)
He can have labs at that appointment.:)

## 2018-01-06 NOTE — Patient Instructions (Signed)
Diabetes Mellitus and Nutrition When you have diabetes (diabetes mellitus), it is very important to have healthy eating habits because your blood sugar (glucose) levels are greatly affected by what you eat and drink. Eating healthy foods in the appropriate amounts, at about the same times every day, can help you:  Control your blood glucose.  Lower your risk of heart disease.  Improve your blood pressure.  Reach or maintain a healthy weight.  Every person with diabetes is different, and each person has different needs for a meal plan. Your health care provider may recommend that you work with a diet and nutrition specialist (dietitian) to make a meal plan that is best for you. Your meal plan may vary depending on factors such as:  The calories you need.  The medicines you take.  Your weight.  Your blood glucose, blood pressure, and cholesterol levels.  Your activity level.  Other health conditions you have, such as heart or kidney disease.  How do carbohydrates affect me? Carbohydrates affect your blood glucose level more than any other type of food. Eating carbohydrates naturally increases the amount of glucose in your blood. Carbohydrate counting is a method for keeping track of how many carbohydrates you eat. Counting carbohydrates is important to keep your blood glucose at a healthy level, especially if you use insulin or take certain oral diabetes medicines. It is important to know how many carbohydrates you can safely have in each meal. This is different for every person. Your dietitian can help you calculate how many carbohydrates you should have at each meal and for snack. Foods that contain carbohydrates include:  Bread, cereal, rice, pasta, and crackers.  Potatoes and corn.  Peas, beans, and lentils.  Milk and yogurt.  Fruit and juice.  Desserts, such as cakes, cookies, ice cream, and candy.  How does alcohol affect me? Alcohol can cause a sudden decrease in blood  glucose (hypoglycemia), especially if you use insulin or take certain oral diabetes medicines. Hypoglycemia can be a life-threatening condition. Symptoms of hypoglycemia (sleepiness, dizziness, and confusion) are similar to symptoms of having too much alcohol. If your health care provider says that alcohol is safe for you, follow these guidelines:  Limit alcohol intake to no more than 1 drink per day for nonpregnant women and 2 drinks per day for men. One drink equals 12 oz of beer, 5 oz of wine, or 1 oz of hard liquor.  Do not drink on an empty stomach.  Keep yourself hydrated with water, diet soda, or unsweetened iced tea.  Keep in mind that regular soda, juice, and other mixers may contain a lot of sugar and must be counted as carbohydrates.  What are tips for following this plan? Reading food labels  Start by checking the serving size on the label. The amount of calories, carbohydrates, fats, and other nutrients listed on the label are based on one serving of the food. Many foods contain more than one serving per package.  Check the total grams (g) of carbohydrates in one serving. You can calculate the number of servings of carbohydrates in one serving by dividing the total carbohydrates by 15. For example, if a food has 30 g of total carbohydrates, it would be equal to 2 servings of carbohydrates.  Check the number of grams (g) of saturated and trans fats in one serving. Choose foods that have low or no amount of these fats.  Check the number of milligrams (mg) of sodium in one serving. Most people   should limit total sodium intake to less than 2,300 mg per day.  Always check the nutrition information of foods labeled as "low-fat" or "nonfat". These foods may be higher in added sugar or refined carbohydrates and should be avoided.  Talk to your dietitian to identify your daily goals for nutrients listed on the label. Shopping  Avoid buying canned, premade, or processed foods. These  foods tend to be high in fat, sodium, and added sugar.  Shop around the outside edge of the grocery store. This includes fresh fruits and vegetables, bulk grains, fresh meats, and fresh dairy. Cooking  Use low-heat cooking methods, such as baking, instead of high-heat cooking methods like deep frying.  Cook using healthy oils, such as olive, canola, or sunflower oil.  Avoid cooking with butter, cream, or high-fat meats. Meal planning  Eat meals and snacks regularly, preferably at the same times every day. Avoid going long periods of time without eating.  Eat foods high in fiber, such as fresh fruits, vegetables, beans, and whole grains. Talk to your dietitian about how many servings of carbohydrates you can eat at each meal.  Eat 4-6 ounces of lean protein each day, such as lean meat, chicken, fish, eggs, or tofu. 1 ounce is equal to 1 ounce of meat, chicken, or fish, 1 egg, or 1/4 cup of tofu.  Eat some foods each day that contain healthy fats, such as avocado, nuts, seeds, and fish. Lifestyle   Check your blood glucose regularly.  Exercise at least 30 minutes 5 or more days each week, or as told by your health care provider.  Take medicines as told by your health care provider.  Do not use any products that contain nicotine or tobacco, such as cigarettes and e-cigarettes. If you need help quitting, ask your health care provider.  Work with a counselor or diabetes educator to identify strategies to manage stress and any emotional and social challenges. What are some questions to ask my health care provider?  Do I need to meet with a diabetes educator?  Do I need to meet with a dietitian?  What number can I call if I have questions?  When are the best times to check my blood glucose? Where to find more information:  American Diabetes Association: diabetes.org/food-and-fitness/food  Academy of Nutrition and Dietetics:  www.eatright.org/resources/health/diseases-and-conditions/diabetes  National Institute of Diabetes and Digestive and Kidney Diseases (NIH): www.niddk.nih.gov/health-information/diabetes/overview/diet-eating-physical-activity Summary  A healthy meal plan will help you control your blood glucose and maintain a healthy lifestyle.  Working with a diet and nutrition specialist (dietitian) can help you make a meal plan that is best for you.  Keep in mind that carbohydrates and alcohol have immediate effects on your blood glucose levels. It is important to count carbohydrates and to use alcohol carefully. This information is not intended to replace advice given to you by your health care provider. Make sure you discuss any questions you have with your health care provider. Document Released: 11/20/2004 Document Revised: 03/30/2016 Document Reviewed: 03/30/2016 Elsevier Interactive Patient Education  2018 Elsevier Inc.  

## 2018-01-07 LAB — BAYER DCA HB A1C WAIVED: HB A1C (BAYER DCA - WAIVED): 6.8 % (ref <7.0)

## 2018-01-12 NOTE — Telephone Encounter (Signed)
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

## 2018-05-19 ENCOUNTER — Other Ambulatory Visit: Payer: Self-pay

## 2018-05-19 ENCOUNTER — Encounter: Payer: Self-pay | Admitting: Family Medicine

## 2018-05-19 ENCOUNTER — Ambulatory Visit (INDEPENDENT_AMBULATORY_CARE_PROVIDER_SITE_OTHER): Payer: Self-pay | Admitting: Family Medicine

## 2018-05-19 VITALS — BP 150/80 | HR 64 | Ht 68.9 in | Wt 169.2 lb

## 2018-05-19 DIAGNOSIS — E1159 Type 2 diabetes mellitus with other circulatory complications: Secondary | ICD-10-CM | POA: Diagnosis not present

## 2018-05-19 DIAGNOSIS — I10 Essential (primary) hypertension: Secondary | ICD-10-CM | POA: Diagnosis not present

## 2018-05-19 DIAGNOSIS — Z029 Encounter for administrative examinations, unspecified: Secondary | ICD-10-CM

## 2018-05-19 DIAGNOSIS — I152 Hypertension secondary to endocrine disorders: Secondary | ICD-10-CM

## 2018-05-19 DIAGNOSIS — Z0289 Encounter for other administrative examinations: Secondary | ICD-10-CM

## 2018-05-19 LAB — HEMOGLOBIN A1C: Hemoglobin A1C: 7

## 2018-05-19 NOTE — Assessment & Plan Note (Addendum)
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  Also needs to be checked 2 more times within 7 days Will be checking potassium.

## 2018-05-19 NOTE — Assessment & Plan Note (Signed)
Will be checking hemoglobin A1c

## 2018-05-19 NOTE — Progress Notes (Signed)
BP (!) 150/80   Pulse 64   Ht 5' 8.9" (1.75 m)   Wt 169 lb 4 oz (76.8 kg)   BMI 25.07 kg/m    Subjective:    Patient ID: Luis Wise, male    DOB: 1940-11-16, 78 y.o.   MRN: [id number]  HPI: Luis Wise is a 78 y.o. male  FAA exam Patient for FAA exam computer chart not available at the time of the exam.  Patient denies letters from the [facility] are special issuance.  On review just now there are many letters from the [facility] which were printed and given to the patient subsequently. Patient will need 3 blood pressure readings for approval which will be done within 2 weeks. Hemoglobin A1c drawn today along with BMP for blood pressure control.   Relevant past medical, surgical, family and social history reviewed and updated as indicated. Interim medical history since our last visit reviewed. Allergies and medications reviewed and updated.  Review of Systems  Constitutional: Negative.   HENT: Negative.   Eyes: Negative.   Respiratory: Negative.   Cardiovascular: Negative.   Gastrointestinal: Negative.   Endocrine: Negative.   Genitourinary: Negative.   Musculoskeletal: Negative.   Skin: Negative.   Allergic/Immunologic: Negative.   Neurological: Negative.   Hematological: Negative.   Psychiatric/Behavioral: Negative.     Per HPI unless specifically indicated above     Objective:    BP (!) 150/80   Pulse 64   Ht 5' 8.9" (1.75 m)   Wt 169 lb 4 oz (76.8 kg)   BMI 25.07 kg/m   Wt Readings from Last 3 Encounters:  05/19/18 169 lb 4 oz (76.8 kg)  01/06/18 170 lb 9.6 oz (77.4 kg)  07/19/17 174 lb 3.2 oz (79 kg)    Physical Exam Constitutional:      Appearance: He is well-developed.  HENT:     Head: Normocephalic.     Right Ear: External ear normal.     Left Ear: External ear normal.     Nose: Nose normal.  Eyes:     Conjunctiva/sclera: Conjunctivae normal.     Pupils: Pupils are equal, round, and reactive to light.  Neck:     Musculoskeletal: Normal range of motion  and neck supple.     Thyroid: No thyromegaly.  Cardiovascular:     Rate and Rhythm: Normal rate and regular rhythm.     Heart sounds: Normal heart sounds.  Pulmonary:     Effort: Pulmonary effort is normal.     Breath sounds: Normal breath sounds.  Abdominal:     General: Bowel sounds are normal.     Palpations: Abdomen is soft. There is no hepatomegaly or splenomegaly.  Genitourinary:    Penis: Normal.   Musculoskeletal: Normal range of motion.  Lymphadenopathy:     Cervical: No cervical adenopathy.  Skin:    General: Skin is warm and dry.  Neurological:     Mental Status: He is alert and oriented to person, place, and time.     Deep Tendon Reflexes: Reflexes are normal and symmetric.  Psychiatric:        Behavior: Behavior normal.        Thought Content: Thought content normal.        Judgment: Judgment normal.     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  Also needs to be checked 2 more times within 7 days Will be checking potassium.      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

## 2018-05-23 ENCOUNTER — Other Ambulatory Visit: Payer: Self-pay

## 2018-05-23 ENCOUNTER — Ambulatory Visit: Payer: Medicare Other

## 2018-05-23 VITALS — BP 176/82

## 2018-05-23 DIAGNOSIS — I10 Essential (primary) hypertension: Secondary | ICD-10-CM

## 2018-05-23 DIAGNOSIS — Z0289 Encounter for other administrative examinations: Secondary | ICD-10-CM

## 2018-05-23 DIAGNOSIS — Z029 Encounter for administrative examinations, unspecified: Principal | ICD-10-CM

## 2018-05-23 NOTE — Progress Notes (Signed)
Patient presented for BP check for FAA. BP was elevated. Reviewed elevated BP w/ provider who recommend OV due to high BP. Patient declined. Laurelyn Sickle, office manager, made aware and went to speak to patient about refusal.

## 2018-05-24 ENCOUNTER — Ambulatory Visit (INDEPENDENT_AMBULATORY_CARE_PROVIDER_SITE_OTHER): Payer: Medicare Other

## 2018-05-24 VITALS — BP 150/74 | HR 59

## 2018-05-24 DIAGNOSIS — Z0289 Encounter for other administrative examinations: Secondary | ICD-10-CM

## 2018-05-24 DIAGNOSIS — I10 Essential (primary) hypertension: Secondary | ICD-10-CM

## 2018-05-24 DIAGNOSIS — Z029 Encounter for administrative examinations, unspecified: Secondary | ICD-10-CM

## 2018-05-24 NOTE — Patient Instructions (Signed)
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

## 2018-05-25 ENCOUNTER — Ambulatory Visit (INDEPENDENT_AMBULATORY_CARE_PROVIDER_SITE_OTHER): Payer: Medicare Other

## 2018-05-25 ENCOUNTER — Other Ambulatory Visit: Payer: Self-pay

## 2018-05-25 VITALS — BP 147/76 | HR 59

## 2018-05-25 DIAGNOSIS — Z029 Encounter for administrative examinations, unspecified: Secondary | ICD-10-CM

## 2018-05-25 DIAGNOSIS — I10 Essential (primary) hypertension: Secondary | ICD-10-CM

## 2018-05-25 DIAGNOSIS — Z0289 Encounter for other administrative examinations: Secondary | ICD-10-CM

## 2018-05-25 LAB — CBC AND DIFFERENTIAL
HCT: 36 — AB (ref 41–53)
Hemoglobin: 11 — AB (ref 13.5–17.5)
Neutrophils Absolute: 62
Platelets: 376 (ref 150–399)
WBC: 8.7

## 2018-05-25 LAB — IRON,TIBC AND FERRITIN PANEL
%SAT: 11
Ferritin: 238
Iron: 40
TIBC: 366
UIBC: 326

## 2018-05-25 NOTE — Patient Instructions (Signed)
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

## 2018-05-26 ENCOUNTER — Ambulatory Visit: Payer: Medicare Other

## 2018-05-26 VITALS — BP 158/72

## 2018-05-26 DIAGNOSIS — Z029 Encounter for administrative examinations, unspecified: Principal | ICD-10-CM

## 2018-05-26 DIAGNOSIS — I10 Essential (primary) hypertension: Secondary | ICD-10-CM

## 2018-05-26 DIAGNOSIS — Z0289 Encounter for other administrative examinations: Secondary | ICD-10-CM

## 2018-05-26 NOTE — Progress Notes (Signed)
Patient presented to office for BP check. First reading was high. Pt requested to wait 5 minutes and recheck. BP was rechecked after 5 minutes.

## 2018-05-31 ENCOUNTER — Other Ambulatory Visit: Payer: Self-pay | Admitting: Family Medicine

## 2018-05-31 ENCOUNTER — Other Ambulatory Visit: Payer: Medicare Other

## 2018-05-31 ENCOUNTER — Other Ambulatory Visit: Payer: Self-pay

## 2018-05-31 DIAGNOSIS — I10 Essential (primary) hypertension: Secondary | ICD-10-CM | POA: Diagnosis not present

## 2018-06-01 LAB — BASIC METABOLIC PANEL WITH GFR
BUN/Creatinine Ratio: 15 (ref 10–24)
BUN: 17 mg/dL (ref 8–27)
CO2: 25 mmol/L (ref 20–29)
Calcium: 10.6 mg/dL — ABNORMAL HIGH (ref 8.6–10.2)
Chloride: 97 mmol/L (ref 96–106)
Creatinine, Ser: 1.16 mg/dL (ref 0.76–1.27)
GFR calc Af Amer: 71 mL/min/1.73
GFR calc non Af Amer: 61 mL/min/1.73
Glucose: 159 mg/dL — ABNORMAL HIGH (ref 65–99)
Potassium: 4 mmol/L (ref 3.5–5.2)
Sodium: 139 mmol/L (ref 134–144)

## 2018-06-06 ENCOUNTER — Encounter: Payer: Self-pay | Admitting: Family Medicine

## 2018-06-07 ENCOUNTER — Telehealth: Payer: Self-pay

## 2018-06-07 NOTE — Telephone Encounter (Signed)
Patient scheduled for an AWV on 06/13/2018 with [facility], Due to Covid-19 pandemic this is unable to be done in office, called patient to see if he was able to do this virtually. Patient able to do this via webex. Scheduled for 06/13/2018 at 8:45am, email sent to patient with instructions for meeting.

## 2018-06-12 ENCOUNTER — Other Ambulatory Visit: Payer: Self-pay | Admitting: Family Medicine

## 2018-06-13 ENCOUNTER — Ambulatory Visit (INDEPENDENT_AMBULATORY_CARE_PROVIDER_SITE_OTHER): Payer: Medicare Other

## 2018-06-13 ENCOUNTER — Other Ambulatory Visit: Payer: Self-pay

## 2018-06-13 VITALS — Ht 69.5 in | Wt 167.0 lb

## 2018-06-13 DIAGNOSIS — Z0000 Encounter for general adult medical examination without abnormal findings: Secondary | ICD-10-CM

## 2018-06-13 NOTE — Progress Notes (Signed)
Subjective:   Luis Wise is a 78 y.o. male who presents for Medicare Annual/Subsequent preventive examination.  This visit is being conducted via webex due to the COVID-19 pandemic. This patient has given me verbal consent via webex to conduct this visit, patient states they are participating from their home address. Some vital signs may be absent or patient reported.   Patient identification: identified by name, DOB, and current address.    Review of Systems:   Cardiac Risk Factors include: advanced age (>6655men, 13>65 women)     Objective:    Vitals: Ht 5' 9.5" (1.765 m) Comment: patient reported  Wt 167 lb (75.8 kg) Comment: patient done at home via webex call  BMI 24.31 kg/m   Body mass index is 24.31 kg/m.  Advanced Directives 06/13/2018  Does Patient Have a Medical Advance Directive? No  Would patient like information on creating a medical advance directive? No - Patient declined    Tobacco Social History   Tobacco Use  Smoking Status Never Smoker  Smokeless Tobacco Never Used     Counseling given: Not Answered   Clinical Intake:  Pre-visit preparation completed: Yes  Pain : No/denies pain     Nutritional Risks: None Diabetes: Yes CBG done?: No Did pt. bring in CBG monitor from home?: No  How often do you need to have someone help you when you read instructions, pamphlets, or other written materials from your doctor or pharmacy?: 1 - Never  Nutrition Risk Assessment:  Has the patient had any N/V/D within the last 2 months?  No  Does the patient have any non-healing wounds?  No  Has the patient had any unintentional weight loss or weight gain?  No   Diabetes:  Is the patient diabetic?  Yes  If diabetic, was a CBG obtained today?  No  Did the patient bring in their glucometer from home?  No  How often do you monitor your CBG's? Doesn't monitor at home, has A1c checked in office .    Diabetic Exams:  Diabetic Eye Exam: Completed 07/27/2017.    Diabetic Foot Exam: due, will completed next time patient is in office.   Interpreter Needed?: No  Information entered by :: Luis Swango,LPN   Past Medical History:  Diagnosis Date  . Diabetes mellitus without complication (HCC)   . Hyperlipidemia   . Hypertension    Past Surgical History:  Procedure Laterality Date  . LAMINECTOMY  1993   L5-S1   Family History  Problem Relation Age of Onset  . Hypertension Mother   . Cancer Neg Hx   . COPD Neg Hx   . Diabetes Neg Hx   . Heart disease Neg Hx   . Hyperlipidemia Neg Hx   . Stroke Neg Hx    Social History   Socioeconomic History  . Marital status: Married    Spouse name: Not on file  . Number of children: Not on file  . Years of education: Not on file  . Highest education level: Not on file  Occupational History  . Occupation: part time   Social Needs  . Financial resource strain: Not hard at all  . Food insecurity:    Worry: Never true    Inability: Never true  . Transportation needs:    Medical: No    Non-medical: No  Tobacco Use  . Smoking status: Never Smoker  . Smokeless tobacco: Never Used  Substance and Sexual Activity  . Alcohol use: Yes    Comment:  rare- occasionaly beer or wine   . Drug use: No  . Sexual activity: Not on file  Lifestyle  . Physical activity:    Days per week: 2 days    Minutes per session: 60 min  . Stress: Not at all  Relationships  . Social connections:    Talks on phone: More than three times a week    Gets together: More than three times a week    Attends religious service: More than 4 times per year    Active member of club or organization: Yes    Attends meetings of clubs or organizations: More than 4 times per year    Relationship status: Married  Other Topics Concern  . Not on file  Social History Narrative  . Not on file    Outpatient Encounter Medications as of 06/13/2018  Medication Sig  . amLODipine (NORVASC) 10 MG tablet Take 1 tablet (10 mg total) by mouth  daily.  . fenofibrate 160 MG tablet Take 1 tablet (160 mg total) by mouth daily.  Marland Kitchen glimepiride (AMARYL) 4 MG tablet Take 1 tablet (4 mg total) by mouth daily with breakfast.  . glucose blood (ONE TOUCH ULTRA TEST) test strip Use as instructed BID to test BS level.  Marland Kitchen lisinopril (PRINIVIL,ZESTRIL) 20 MG tablet Take 1 tablet (20 mg total) by mouth daily.  Marland Kitchen lisinopril-hydrochlorothiazide (PRINZIDE,ZESTORETIC) 20-25 MG tablet Take 1 tablet by mouth daily.  . metFORMIN (GLUCOPHAGE) 1000 MG tablet Take 1 tablet (1,000 mg total) by mouth 2 (two) times daily.  . metoprolol tartrate (LOPRESSOR) 50 MG tablet Take 1 tablet (50 mg total) by mouth 2 (two) times daily.  . pravastatin (PRAVACHOL) 20 MG tablet Take 1 tablet (20 mg total) by mouth daily.  . sitaGLIPtin (JANUVIA) 100 MG tablet Take 1 tablet (100 mg total) by mouth daily.  . traZODone (DESYREL) 50 MG tablet TAKE 1 TABLET BY MOUTH EVERY DAY   No facility-administered encounter medications on file as of 06/13/2018.     Activities of Daily Living In your present state of health, do you have any difficulty performing the following activities: 06/13/2018  Hearing? N  Vision? N  Difficulty concentrating or making decisions? N  Walking or climbing stairs? N  Dressing or bathing? N  Doing errands, shopping? N  Preparing Food and eating ? N  Using the Toilet? N  In the past six months, have you accidently leaked urine? N  Do you have problems with loss of bowel control? N  Managing your Medications? N  Managing your Finances? N  Housekeeping or managing your Housekeeping? N  Some recent data might be hidden    Patient Care Team: Luis Skiff, NP as PCP - General (Nurse Practitioner)   Assessment:   This is a routine wellness examination for Luis Wise.  Exercise Activities and Dietary recommendations  Current Exercise Habits: Home exercise routine, Type of exercise: walking(3 miles a day ), Time (Minutes): 60, Frequency (Times/Week): 4,  Weekly Exercise (Minutes/Week): 240, Intensity: Mild, Exercise limited by: None identified  Goals   None     Fall Risk Fall Risk  06/13/2018 06/09/2017 06/09/2017 09/14/2016 06/08/2016  Falls in the past year? 0 No Yes No No   FALL RISK PREVENTION PERTAINING TO THE HOME:  Any stairs in or around the home? Yes  If so, are there any without handrails? No   Home free of loose throw rugs in walkways, pet beds, electrical cords, etc? Yes  Adequate lighting in your home  to reduce risk of falls? Yes   ASSISTIVE DEVICES UTILIZED TO PREVENT FALLS:  Life alert? No  Use of a cane, walker or w/c? No  Grab bars in the bathroom? No  Shower chair or bench in shower? No  Elevated toilet seat or a handicapped toilet? No    TIMED UP AND GO:  Was the test performed? No .  Unable to perform     Depression Screen PHQ 2/9 Scores 06/13/2018 06/09/2017 09/14/2016 06/08/2016  PHQ - 2 Score 0 0 0 0    Cognitive Function     6CIT Screen 06/13/2018 06/09/2017  What Year? 0 points 0 points  What month? 0 points 0 points  What time? 0 points 0 points  Count back from 20 0 points 0 points  Months in reverse 0 points 0 points  Repeat phrase 0 points 2 points  Total Score 0 2    Immunization History  Administered Date(s) Administered  . Influenza Inj Mdck Quad Pf 12/16/2017  . Influenza, High Dose Seasonal PF 12/25/2016  . Influenza-Unspecified 12/17/2015, 12/16/2017  . Pneumococcal Conjugate-13 03/09/2013  . Pneumococcal Polysaccharide-23 03/09/2012, 03/09/2014  . Zoster Recombinat (Shingrix) 01/11/2018, 03/14/2018    Qualifies for Shingles Vaccine? Completed series   Tdap: patient informed insurance does not cover this medication as a preventative and to call if he needs this immunization.   Flu Vaccine: up to date   Pneumococcal Vaccine: up to date   Screening Tests Health Maintenance  Topic Date Due  . FOOT EXAM  12/29/2017  . TETANUS/TDAP  07/20/2018 (Originally 02/14/1960)  . OPHTHALMOLOGY  EXAM  07/28/2018  . INFLUENZA VACCINE  10/08/2018  . HEMOGLOBIN A1C  11/19/2018  . COLONOSCOPY  03/10/2023  . PNA vac Low Risk Adult  Completed   Cancer Screenings:  Colorectal Screening: Completed 03/09/2013. Repeat every 10 years  Lung Cancer Screening: (Low Dose CT Chest recommended if Age 39-80 years, 30 pack-year currently smoking OR have quit w/in 15years.) does not qualify.     Additional Screening:  Hepatitis C Screening: does not qualify  Dental Screening: Recommended annual dental exams for proper oral hygiene  Community Resource Referral:  CRR required this visit?  No        Plan:  I have personally reviewed and addressed the Medicare Annual Wellness questionnaire and have noted the following in the patient's chart:  A. Medical and social history B. Use of alcohol, tobacco or illicit drugs  C. Current medications and supplements D. Functional ability and status E.  Nutritional status F.  Physical activity G. Advance directives H. List of other physicians I.  Hospitalizations, surgeries, and ER visits in previous 12 months J.  Vitals K. Screenings such as hearing and vision if needed, cognitive and depression L. Referrals and appointments   In addition, I have reviewed and discussed with patient certain preventive protocols, quality metrics, and best practice recommendations. A written personalized care plan for preventive services as well as general preventive health recommendations were provided to patient via mychart.    Signed,   Collene Schlichter, LPN  03/14/1094 Nurse Health Advisor  Nurse Notes: none

## 2018-06-13 NOTE — Patient Instructions (Signed)
[name] , Thank you for taking time to come for your Medicare Wellness Visit. I appreciate your ongoing commitment to your health goals. Please review the following plan we discussed and let me know if I can assist you in the future.   Screening recommendations/referrals: Colonoscopy: completed 03/09/2013 Recommended yearly ophthalmology/optometry visit for glaucoma screening and checkup Recommended yearly dental visit for hygiene and checkup  Vaccinations: Influenza vaccine: up to date Pneumococcal vaccine: up to date Tdap vaccine: due, check with your insurance company for coverage Shingles vaccine: shingrix series completed    Advanced directives: Advance directive discussed with you today.if you would like more information on advanced directives please pick up packet the next time you are at our office.   Conditions/risks identified: diabetic  Next appointment: Follow up in one year for your annual wellness exam.   Preventive Care 65 Years and Older, Male Preventive care refers to lifestyle choices and visits with your health care provider that can promote health and wellness. What does preventive care include?  A yearly physical exam. This is also called an annual well check.  Dental exams once or twice a year.  Routine eye exams. Ask your health care provider how often you should have your eyes checked.  Personal lifestyle choices, including:  Daily care of your teeth and gums.  Regular physical activity.  Eating a healthy diet.  Avoiding tobacco and drug use.  Limiting alcohol use.  Practicing safe sex.  Taking low doses of aspirin every day.  Taking vitamin and mineral supplements as recommended by your health care provider. What happens during an annual well check? The services and screenings done by your health care provider during your annual well check will depend on your age, overall health, lifestyle risk factors, and family history of disease.  Counseling  Your health care provider may ask you questions about your:  Alcohol use.  Tobacco use.  Drug use.  Emotional well-being.  Home and relationship well-being.  Sexual activity.  Eating habits.  History of falls.  Memory and ability to understand (cognition).  Work and work Astronomer. Screening  You may have the following tests or measurements:  Height, weight, and BMI.  Blood pressure.  Lipid and cholesterol levels. These may be checked every 5 years, or more frequently if you are over 21 years old.  Skin check.  Lung cancer screening. You may have this screening every year starting at age 37 if you have a 30-pack-year history of smoking and currently smoke or have quit within the past 15 years.  Fecal occult blood test (FOBT) of the stool. You may have this test every year starting at age 70.  Flexible sigmoidoscopy or colonoscopy. You may have a sigmoidoscopy every 5 years or a colonoscopy every 10 years starting at age 85.  Prostate cancer screening. Recommendations will vary depending on your family history and other risks.  Hepatitis C blood test.  Hepatitis B blood test.  Sexually transmitted disease (STD) testing.  Diabetes screening. This is done by checking your blood sugar (glucose) after you have not eaten for a while (fasting). You may have this done every 1-3 years.  Abdominal aortic aneurysm (AAA) screening. You may need this if you are a current or former smoker.  Osteoporosis. You may be screened starting at age 25 if you are at high risk. Talk with your health care provider about your test results, treatment options, and if necessary, the need for more tests. Vaccines  Your health care provider  may recommend certain vaccines, such as:  Influenza vaccine. This is recommended every year.  Tetanus, diphtheria, and acellular pertussis (Tdap, Td) vaccine. You may need a Td booster every 10 years.  Zoster vaccine. You may need this  after age 4.  Pneumococcal 13-valent conjugate (PCV13) vaccine. One dose is recommended after age 72.  Pneumococcal polysaccharide (PPSV23) vaccine. One dose is recommended after age 24. Talk to your health care provider about which screenings and vaccines you need and how often you need them. This information is not intended to replace advice given to you by your health care provider. Make sure you discuss any questions you have with your health care provider. Document Released: 03/22/2015 Document Revised: 11/13/2015 Document Reviewed: 12/25/2014 Elsevier Interactive Patient Education  2017 [place] Prevention in the Home Falls can cause injuries. They can happen to people of all ages. There are many things you can do to make your home safe and to help prevent falls. What can I do on the outside of my home?  Regularly fix the edges of walkways and driveways and fix any cracks.  Remove anything that might make you trip as you walk through a door, such as a raised step or threshold.  Trim any bushes or trees on the path to your home.  Use bright outdoor lighting.  Clear any walking paths of anything that might make someone trip, such as rocks or tools.  Regularly check to see if handrails are loose or broken. Make sure that both sides of any steps have handrails.  Any raised decks and porches should have guardrails on the edges.  Have any leaves, snow, or ice cleared regularly.  Use sand or salt on walking paths during winter.  Clean up any spills in your garage right away. This includes oil or grease spills. What can I do in the bathroom?  Use night lights.  Install grab bars by the toilet and in the tub and shower. Do not use towel bars as grab bars.  Use non-skid mats or decals in the tub or shower.  If you need to sit down in the shower, use a plastic, non-slip stool.  Keep the floor dry. Clean up any water that spills on the floor as soon as it happens.   Remove soap buildup in the tub or shower regularly.  Attach bath mats securely with double-sided non-slip rug tape.  Do not have throw rugs and other things on the floor that can make you trip. What can I do in the bedroom?  Use night lights.  Make sure that you have a light by your bed that is easy to reach.  Do not use any sheets or blankets that are too big for your bed. They should not hang down onto the floor.  Have a firm chair that has side arms. You can use this for support while you get dressed.  Do not have throw rugs and other things on the floor that can make you trip. What can I do in the kitchen?  Clean up any spills right away.  Avoid walking on wet floors.  Keep items that you use a lot in easy-to-reach places.  If you need to reach something above you, use a strong step stool that has a grab bar.  Keep electrical cords out of the way.  Do not use floor polish or wax that makes floors slippery. If you must use wax, use non-skid floor wax.  Do not have throw rugs  and other things on the floor that can make you trip. What can I do with my stairs?  Do not leave any items on the stairs.  Make sure that there are handrails on both sides of the stairs and use them. Fix handrails that are broken or loose. Make sure that handrails are as long as the stairways.  Check any carpeting to make sure that it is firmly attached to the stairs. Fix any carpet that is loose or worn.  Avoid having throw rugs at the top or bottom of the stairs. If you do have throw rugs, attach them to the floor with carpet tape.  Make sure that you have a light switch at the top of the stairs and the bottom of the stairs. If you do not have them, ask someone to add them for you. What else can I do to help prevent falls?  Wear shoes that:  Do not have high heels.  Have rubber bottoms.  Are comfortable and fit you well.  Are closed at the toe. Do not wear sandals.  If you use a  stepladder:  Make sure that it is fully opened. Do not climb a closed stepladder.  Make sure that both sides of the stepladder are locked into place.  Ask someone to hold it for you, if possible.  Clearly mark and make sure that you can see:  Any grab bars or handrails.  First and last steps.  Where the edge of each step is.  Use tools that help you move around (mobility aids) if they are needed. These include:  Canes.  Walkers.  Scooters.  Crutches.  Turn on the lights when you go into a dark area. Replace any light bulbs as soon as they burn out.  Set up your furniture so you have a clear path. Avoid moving your furniture around.  If any of your floors are uneven, fix them.  If there are any pets around you, be aware of where they are.  Review your medicines with your doctor. Some medicines can make you feel dizzy. This can increase your chance of falling. Ask your doctor what other things that you can do to help prevent falls. This information is not intended to replace advice given to you by your health care provider. Make sure you discuss any questions you have with your health care provider. Document Released: 12/20/2008 Document Revised: 08/01/2015 Document Reviewed: 03/30/2014 Elsevier Interactive Patient Education  2017 [employer].

## 2018-06-21 ENCOUNTER — Other Ambulatory Visit: Payer: Self-pay | Admitting: Family Medicine

## 2018-06-23 ENCOUNTER — Other Ambulatory Visit: Payer: Self-pay | Admitting: Nurse Practitioner

## 2018-06-23 ENCOUNTER — Other Ambulatory Visit: Payer: Self-pay | Admitting: Family Medicine

## 2018-06-23 DIAGNOSIS — E119 Type 2 diabetes mellitus without complications: Secondary | ICD-10-CM

## 2018-06-23 DIAGNOSIS — E782 Mixed hyperlipidemia: Secondary | ICD-10-CM

## 2018-06-23 NOTE — Telephone Encounter (Signed)
Requested Prescriptions  Pending Prescriptions Disp Refills  . 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 metFORMIN (GLUCOPHAGE) 1000 MG tablet [Pharmacy Med Name: METFORMIN HCL 1,000 MG TABLET] 180 tablet 4    Sig: TAKE 1 TABLET BY MOUTH TWICE A DAY     Endocrinology:  Diabetes - Biguanides Passed - 06/23/2018  4:43 PM      Passed - Cr in normal range and within 360 days    Creatinine, Ser  Date Value Ref Range Status  05/31/2018 1.16 0.76 - 1.27 mg/dL Final         Passed - HBA1C is between 0 and 7.9 and within 180 days    Hemoglobin A1C  Date Value Ref Range Status  05/19/2018 7.0  Final         Passed - eGFR in normal range and within 360 days    GFR calc Af Amer  Date Value Ref Range Status  05/31/2018 71 >59 mL/min/1.73 Final   GFR calc non Af  Amer  Date Value Ref Range Status  05/31/2018 61 >59 mL/min/1.73 Final         Passed - Valid encounter within last 6 months    Recent Outpatient Visits          1 month ago Encounter for [employer]) examination   [facility], Jeannette How, MD   5 months ago Type 2 diabetes mellitus without complication, without long-term current use of insulin ([place])   [place], Garrett T, NP   11 months ago Benign essential HTN   [facility] Kathrine Haddock, NP   1 year ago Advanced care planning/counseling discussion   [facility] Crissman, Jeannette How, MD   1 year ago Benign essential HTN   [facility] Kathrine Haddock, NP      Future Appointments            In 4 days Cannady, Barbaraann Faster, NP [facility], [facility]

## 2018-06-24 ENCOUNTER — Other Ambulatory Visit: Payer: Medicare Other

## 2018-06-24 ENCOUNTER — Other Ambulatory Visit: Payer: Self-pay

## 2018-06-24 DIAGNOSIS — E782 Mixed hyperlipidemia: Secondary | ICD-10-CM

## 2018-06-24 DIAGNOSIS — E119 Type 2 diabetes mellitus without complications: Secondary | ICD-10-CM

## 2018-06-24 LAB — MICROALBUMIN, URINE WAIVED
Creatinine, Urine Waived: 100 mg/dL (ref 10–300)
Microalb, Ur Waived: 30 mg/L — ABNORMAL HIGH (ref 0–19)
Microalb/Creat Ratio: <30 mg/g (ref <30)

## 2018-06-24 LAB — LIPID PANEL PICCOLO, WAIVED
Chol/HDL Ratio Piccolo,Waive: 4.6 mg/dL
Cholesterol Piccolo, Waived: 151 mg/dL (ref <200)
HDL Chol Piccolo, Waived: 32 mg/dL — ABNORMAL LOW (ref >59)
LDL Chol Calc Piccolo Waived: 86 mg/dL (ref <100)
Triglycerides Piccolo,Waived: 161 mg/dL — ABNORMAL HIGH (ref <150)
VLDL Chol Calc Piccolo,Waive: 32 mg/dL — ABNORMAL HIGH (ref <30)

## 2018-06-27 ENCOUNTER — Ambulatory Visit (INDEPENDENT_AMBULATORY_CARE_PROVIDER_SITE_OTHER): Payer: Medicare Other | Admitting: Nurse Practitioner

## 2018-06-27 ENCOUNTER — Other Ambulatory Visit: Payer: Self-pay

## 2018-06-27 ENCOUNTER — Encounter: Payer: Self-pay | Admitting: Nurse Practitioner

## 2018-06-27 VITALS — BP 130/78 | HR 60 | Temp 98.0°F | Ht 68.9 in | Wt 165.0 lb

## 2018-06-27 DIAGNOSIS — E1159 Type 2 diabetes mellitus with other circulatory complications: Secondary | ICD-10-CM | POA: Diagnosis not present

## 2018-06-27 DIAGNOSIS — E1165 Type 2 diabetes mellitus with hyperglycemia: Secondary | ICD-10-CM | POA: Insufficient documentation

## 2018-06-27 DIAGNOSIS — E782 Mixed hyperlipidemia: Secondary | ICD-10-CM | POA: Diagnosis not present

## 2018-06-27 DIAGNOSIS — E118 Type 2 diabetes mellitus with unspecified complications: Secondary | ICD-10-CM | POA: Insufficient documentation

## 2018-06-27 DIAGNOSIS — I10 Essential (primary) hypertension: Secondary | ICD-10-CM | POA: Diagnosis not present

## 2018-06-27 DIAGNOSIS — I152 Hypertension secondary to endocrine disorders: Secondary | ICD-10-CM

## 2018-06-27 DIAGNOSIS — E119 Type 2 diabetes mellitus without complications: Secondary | ICD-10-CM | POA: Diagnosis not present

## 2018-06-27 NOTE — Assessment & Plan Note (Signed)
Chronic, ongoing.  March A1C 7.0%.  Continue current medication regimen and return in 2 months.

## 2018-06-27 NOTE — Progress Notes (Addendum)
BP 130/78   Pulse 60   Temp 98 F (36.7 C) (Oral)   Ht 5' 8.9" (1.75 m)   Wt 165 lb (74.8 kg)   BMI 24.44 kg/m    Subjective:    Patient ID: Luis Wise, male    DOB: [birth year], 78 y.o.   MRN: [id number]  HPI: Luis Wise is a 78 y.o. male  Chief Complaint  Patient presents with  . Diabetes    f/u  . Hypertension  . Hyperlipidemia    . This visit was completed via telephone due to the restrictions of the COVID-19 pandemic. All issues as above were discussed and addressed but no physical exam was performed. If it was felt that the patient should be evaluated in the office, they were directed there. The patient verbally consented to this visit. Patient was unable to complete an audio/visual visit due to Lack of equipment. Due to the catastrophic nature of the COVID-19 pandemic, this visit was done through audio contact only. . Location of the patient: home . Location of the provider: work . Those involved with this call:  . Provider: Aura Dials, DNP . CMA: Elton Sin, CMA . Front Desk/Registration: Adela Ports  . Time spent on call: 15 minutes on the phone discussing health concerns. 10 minutes total spent in review of patient's record and preparation of their chart.   DIABETES Last A1C done 05/19/2018, 7.0%.  Continues on Metformin, Amaryl, and Januvia.   Hypoglycemic episodes:no Polydipsia/polyuria: no Visual disturbance: no Chest pain: no Paresthesias: no Glucose Monitoring: no  Accucheck frequency: Not Checking  Fasting glucose:  Post prandial:  Evening:  Before meals: Taking Insulin?: no  Long acting insulin:  Short acting insulin: Blood Pressure Monitoring: daily Retinal Examination: Up to Date Foot Exam: Not up to Date Pneumovax: Up to Date Influenza: Up to Date Aspirin: no   HYPERTENSION / HYPERLIPIDEMIA Current medications include Lisinopril-HCTZ + Metoprolol + Amlodipine for HTN and Pravastatin for HLD. Satisfied with current  treatment? yes Duration of hypertension: chronic BP monitoring frequency: daily BP range: 130/70's BP medication side effects: no Duration of hyperlipidemia: chronic Cholesterol medication side effects: no Cholesterol supplements: none Medication compliance: good compliance Aspirin: no Recent stressors: no Recurrent headaches: no Visual changes: no Palpitations: no Dyspnea: no Chest pain: no Lower extremity edema: no Dizzy/lightheaded: no  Relevant past medical, surgical, family and social history reviewed and updated as indicated. Interim medical history since our last visit reviewed. Allergies and medications reviewed and updated.  Review of Systems  Constitutional: Negative for activity change, diaphoresis, fatigue and fever.  Respiratory: Negative for cough, chest tightness, shortness of breath and wheezing.   Cardiovascular: Negative for chest pain, palpitations and leg swelling.  Gastrointestinal: Negative for abdominal distention, abdominal pain, constipation, diarrhea, nausea and vomiting.  Endocrine: Negative for cold intolerance, heat intolerance, polydipsia, polyphagia and polyuria.  Musculoskeletal: Negative.   Skin: Negative.   Neurological: Negative for dizziness, syncope, weakness, light-headedness, numbness and headaches.  Psychiatric/Behavioral: Negative.     Per HPI unless specifically indicated above     Objective:    BP 130/78   Pulse 60   Temp 98 F (36.7 C) (Oral)   Ht 5' 8.9" (1.75 m)   Wt 165 lb (74.8 kg)   BMI 24.44 kg/m   Wt Readings from Last 3 Encounters:  06/27/18 165 lb (74.8 kg)  06/13/18 167 lb (75.8 kg)  05/19/18 169 lb 4 oz (76.8 kg)    Physical Exam   No physical exam  due to patient lack of visual access.  Results for orders placed or performed in visit on 06/24/18  Microalbumin, Urine Waived  Result Value Ref Range   Microalb, Ur Waived 30 (H) 0 - 19 mg/L   Creatinine, Urine Waived 100 10 - 300 mg/dL   Microalb/Creat  Ratio <30 <30 mg/g  Lipid Panel Piccolo, Waived  Result Value Ref Range   Cholesterol Piccolo, Waived 151 <200 mg/dL   HDL Chol Piccolo, Waived 32 (L) >59 mg/dL   Triglycerides Piccolo,Waived 161 (H) <150 mg/dL   Chol/HDL Ratio Piccolo,Waive 4.6 mg/dL   LDL Chol Calc Piccolo Waived 86 <100 mg/dL   VLDL Chol Calc Piccolo,Waive 32 (H) <30 mg/dL      Assessment & Plan:   Problem List Items Addressed This Visit      Cardiovascular and Mediastinum   Hypertension associated with diabetes (HCC)    Chronic, ongoing.  BP at goal on home checks.  Continue current medication regimen.          Endocrine   Type 2 diabetes mellitus without complication, without long-term current use of insulin (HCC) - Primary    Chronic, ongoing.  March A1C 7.0%.  Continue current medication regimen and return in 2 months.          Other   Hyperlipemia    Chronic, ongoing.  Continue current medication regimen.           I discussed the assessment and treatment plan with the patient. The patient was provided an opportunity to ask questions and all were answered. The patient agreed with the plan and demonstrated an understanding of the instructions.   The patient was advised to call back or seek an in-person evaluation if the symptoms worsen or if the condition fails to improve as anticipated.   I provided 20 minutes of time during this encounter.  Follow up plan: Return in about 2 months (around 08/27/2018) for T2DM, HTN/HLD.

## 2018-06-27 NOTE — Assessment & Plan Note (Signed)
Chronic, ongoing.  BP at goal on home checks.  Continue current medication regimen.

## 2018-06-27 NOTE — Patient Instructions (Signed)
Carbohydrate Counting for Diabetes Mellitus, Adult  Carbohydrate counting is a method of keeping track of how many carbohydrates you eat. Eating carbohydrates naturally increases the amount of sugar (glucose) in the blood. Counting how many carbohydrates you eat helps keep your blood glucose within normal limits, which helps you manage your diabetes (diabetes mellitus). It is important to know how many carbohydrates you can safely have in each meal. This is different for every person. A diet and nutrition specialist (registered dietitian) can help you make a meal plan and calculate how many carbohydrates you should have at each meal and snack. Carbohydrates are found in the following foods:  Grains, such as breads and cereals.  Dried beans and soy products.  Starchy vegetables, such as potatoes, peas, and corn.  Fruit and fruit juices.  Milk and yogurt.  Sweets and snack foods, such as cake, cookies, candy, chips, and soft drinks. How do I count carbohydrates? There are two ways to count carbohydrates in food. You can use either of the methods or a combination of both. Reading "Nutrition Facts" on packaged food The "Nutrition Facts" list is included on the labels of almost all packaged foods and beverages in the [place] It includes:  The serving size.  Information about nutrients in each serving, including the grams (g) of carbohydrate per serving. To use the "Nutrition Facts":  Decide how many servings you will have.  Multiply the number of servings by the number of carbohydrates per serving.  The resulting number is the total amount of carbohydrates that you will be having. Learning standard serving sizes of other foods When you eat carbohydrate foods that are not packaged or do not include "Nutrition Facts" on the label, you need to measure the servings in order to count the amount of carbohydrates:  Measure the foods that you will eat with a food scale or measuring cup, if needed.   Decide how many standard-size servings you will eat.  Multiply the number of servings by 15. Most carbohydrate-rich foods have about 15 g of carbohydrates per serving. ? For example, if you eat 8 oz (170 g) of strawberries, you will have eaten 2 servings and 30 g of carbohydrates (2 servings x 15 g = 30 g).  For foods that have more than one food mixed, such as soups and casseroles, you must count the carbohydrates in each food that is included. The following list contains standard serving sizes of common carbohydrate-rich foods. Each of these servings has about 15 g of carbohydrates:   hamburger bun or  English muffin.   oz (15 mL) syrup.   oz (14 g) jelly.  1 slice of bread.  1 six-inch tortilla.  3 oz (85 g) cooked rice or pasta.  4 oz (113 g) cooked dried beans.  4 oz (113 g) starchy vegetable, such as peas, corn, or potatoes.  4 oz (113 g) hot cereal.  4 oz (113 g) mashed potatoes or  of a large baked potato.  4 oz (113 g) canned or frozen fruit.  4 oz (120 mL) fruit juice.  4-6 crackers.  6 chicken nuggets.  6 oz (170 g) unsweetened dry cereal.  6 oz (170 g) plain fat-free yogurt or yogurt sweetened with artificial sweeteners.  8 oz (240 mL) milk.  8 oz (170 g) fresh fruit or one small piece of fruit.  24 oz (680 g) popped popcorn. Example of carbohydrate counting Sample meal  3 oz (85 g) chicken breast.  6 oz (170 g)   brown rice.  4 oz (113 g) corn.  8 oz (240 mL) milk.  8 oz (170 g) strawberries with sugar-free whipped topping. Carbohydrate calculation 1. Identify the foods that contain carbohydrates: ? Rice. ? Corn. ? Milk. ? Strawberries. 2. Calculate how many servings you have of each food: ? 2 servings rice. ? 1 serving corn. ? 1 serving milk. ? 1 serving strawberries. 3. Multiply each number of servings by 15 g: ? 2 servings rice x 15 g = 30 g. ? 1 serving corn x 15 g = 15 g. ? 1 serving milk x 15 g = 15 g. ? 1 serving  strawberries x 15 g = 15 g. 4. Add together all of the amounts to find the total grams of carbohydrates eaten: ? 30 g + 15 g + 15 g + 15 g = 75 g of carbohydrates total. Summary  Carbohydrate counting is a method of keeping track of how many carbohydrates you eat.  Eating carbohydrates naturally increases the amount of sugar (glucose) in the blood.  Counting how many carbohydrates you eat helps keep your blood glucose within normal limits, which helps you manage your diabetes.  A diet and nutrition specialist (registered dietitian) can help you make a meal plan and calculate how many carbohydrates you should have at each meal and snack. This information is not intended to replace advice given to you by your health care provider. Make sure you discuss any questions you have with your health care provider. Document Released: 02/23/2005 Document Revised: 09/02/2016 Document Reviewed: 08/07/2015 Elsevier Interactive Patient Education  2019 [employer]  

## 2018-06-27 NOTE — Assessment & Plan Note (Signed)
Chronic, ongoing.  Continue current medication regimen.   

## 2018-07-07 ENCOUNTER — Other Ambulatory Visit: Payer: Self-pay | Admitting: Family Medicine

## 2018-07-07 NOTE — Telephone Encounter (Signed)
Per OV- 06/2018 patient to continue current medication and return it 2 months Requested Prescriptions  Pending Prescriptions Disp Refills  . glimepiride (AMARYL) 4 MG tablet [Pharmacy Med Name: GLIMEPIRIDE 4 MG TABLET] 90 tablet 0    Sig: TAKE 1 TABLET (4 MG TOTAL) BY MOUTH DAILY WITH BREAKFAST.     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

## 2018-07-28 ENCOUNTER — Ambulatory Visit (INDEPENDENT_AMBULATORY_CARE_PROVIDER_SITE_OTHER): Payer: Medicare Other | Admitting: Podiatry

## 2018-07-28 ENCOUNTER — Encounter: Payer: Self-pay | Admitting: Podiatry

## 2018-07-28 ENCOUNTER — Other Ambulatory Visit: Payer: Self-pay

## 2018-07-28 VITALS — Temp 98.1°F

## 2018-07-28 DIAGNOSIS — B070 Plantar wart: Secondary | ICD-10-CM

## 2018-07-28 NOTE — Progress Notes (Signed)
Subjective:  Patient ID: Luis Wise, male    DOB: [birth year],  MRN: [id number] HPI Chief Complaint  Patient presents with  . Foot Pain    Plantar forefoot left - callused lesion x 6 months, wears padding while in shoes, walking barefoot is uncomfortable  . New Patient (Initial Visit)    78 y.o. male presents with the above complaint.   ROS: Denies fever chills nausea vomiting muscle aches pains calf pain back pain chest pain shortness of breath.  Past Medical History:  Diagnosis Date  . Diabetes mellitus without complication (HCC)   . Hyperlipidemia   . Hypertension    Past Surgical History:  Procedure Laterality Date  . LAMINECTOMY  1993   L5-S1    Current Outpatient Medications:  .  amLODipine (NORVASC) 10 MG tablet, Take 1 tablet (10 mg total) by mouth daily., Disp: 90 tablet, Rfl: 4 .  fenofibrate 160 MG tablet, Take 1 tablet (160 mg total) by mouth daily., Disp: 90 tablet, Rfl: 4 .  glimepiride (AMARYL) 4 MG tablet, TAKE 1 TABLET (4 MG TOTAL) BY MOUTH DAILY WITH BREAKFAST., Disp: 90 tablet, Rfl: 0 .  glucose blood (ONE TOUCH ULTRA TEST) test strip, Use as instructed BID to test BS level., Disp: 100 each, Rfl: 12 .  lisinopril (PRINIVIL,ZESTRIL) 20 MG tablet, TAKE 1 TABLET BY MOUTH EVERY DAY, Disp: 90 tablet, Rfl: 1 .  lisinopril-hydrochlorothiazide (PRINZIDE,ZESTORETIC) 20-25 MG tablet, Take 1 tablet by mouth daily., Disp: 90 tablet, Rfl: 4 .  metFORMIN (GLUCOPHAGE) 1000 MG tablet, TAKE 1 TABLET BY MOUTH TWICE A DAY, Disp: 180 tablet, Rfl: 4 .  metoprolol tartrate (LOPRESSOR) 50 MG tablet, TAKE 1 TABLET BY MOUTH TWICE A DAY, Disp: 180 tablet, Rfl: 4 .  pravastatin (PRAVACHOL) 20 MG tablet, Take 1 tablet (20 mg total) by mouth daily., Disp: 90 tablet, Rfl: 4 .  sitaGLIPtin (JANUVIA) 100 MG tablet, Take 1 tablet (100 mg total) by mouth daily., Disp: 90 tablet, Rfl: 4 .  traZODone (DESYREL) 50 MG tablet, TAKE 1 TABLET BY MOUTH EVERY DAY, Disp: 90 tablet, Rfl: 0  Allergies   Allergen Reactions  . Amoxil [Amoxicillin] Rash   Review of Systems Objective:   Vitals:   07/28/18 1426  Temp: 98.1 F (36.7 C)    General: Well developed, nourished, in no acute distress, alert and oriented x3   Dermatological: Skin is warm, dry and supple bilateral. Nails x 10 are well maintained; remaining integument appears unremarkable at this time. There are no open sores, no preulcerative lesions, no rash or signs of infection present.  Small verrucoid lesion measuring less than a centimeter in diameter forefoot left beneath the fourth metatarsal head.  Thrombosed capillaries are visible skin lines do circumvent the lesion.  This appears to be nothing more than a wart.   Vascular: Dorsalis Pedis artery and Posterior Tibial artery pedal pulses are 2/4 bilateral with immedate capillary fill time. Pedal hair growth present. No varicosities and no lower extremity edema present bilateral.   Neruologic: Grossly intact via light touch bilateral. Vibratory intact via tuning fork bilateral. Protective threshold with Semmes Wienstein monofilament intact to all pedal sites bilateral. Patellar and Achilles deep tendon reflexes 2+ bilateral. No Babinski or clonus noted bilateral.   Musculoskeletal: No gross boney pedal deformities bilateral. No pain, crepitus, or limitation noted with foot and ankle range of motion bilateral. Muscular strength 5/5 in all groups tested bilateral.  Gait: Unassisted, Nonantalgic.    Radiographs:  None taken  Assessment & Plan:  Assessment: Verruca plantaris left foot.    Plan: After local anesthetic was administered sub-lesion only surgical curettage was performed he tolerated procedure well that he specimen was sent for pathologic evaluation he was given both oral and written home-going structures for care and soaking of the foot.  He understands this is amenable to it.  Follow-up with him in 2 weeks.     Meegan Shanafelt T. RuskinHyatt, [place] DakotaDPM

## 2018-07-28 NOTE — Patient Instructions (Signed)

## 2018-08-11 ENCOUNTER — Ambulatory Visit: Payer: Medicare Other | Admitting: Podiatry

## 2018-08-15 ENCOUNTER — Ambulatory Visit (INDEPENDENT_AMBULATORY_CARE_PROVIDER_SITE_OTHER): Payer: Medicare Other | Admitting: Podiatry

## 2018-08-15 ENCOUNTER — Encounter: Payer: Self-pay | Admitting: Podiatry

## 2018-08-15 ENCOUNTER — Other Ambulatory Visit: Payer: Self-pay

## 2018-08-15 VITALS — Temp 96.9°F

## 2018-08-15 DIAGNOSIS — Z9889 Other specified postprocedural states: Secondary | ICD-10-CM

## 2018-08-15 DIAGNOSIS — B070 Plantar wart: Secondary | ICD-10-CM

## 2018-08-15 NOTE — Progress Notes (Signed)
He presents today for follow-up of his surgical curettage plantar aspect left foot for wart excision.  States is still little tender but seems to be doing better.  Objective: Vital signs are stable he is alert and oriented x3 no erythema edema cellulitis drainage odor surgical site appears to be healing very nicely is dry and clean.  Assessment: Well-healing surgical foot.  Plan: Continue soaking every other day continue to apply small amount of triple antibiotic ointment or Vaseline to the area and leave the Band-Aid off at bedtime.

## 2018-09-04 ENCOUNTER — Other Ambulatory Visit: Payer: Self-pay | Admitting: Family Medicine

## 2018-09-04 ENCOUNTER — Other Ambulatory Visit: Payer: Self-pay | Admitting: Nurse Practitioner

## 2018-09-04 NOTE — Telephone Encounter (Signed)
Requested Prescriptions  Pending Prescriptions Disp Refills  . traZODone (DESYREL) 50 MG tablet [Pharmacy Med Name: TRAZODONE 50 MG TABLET] 90 tablet 0    Sig: TAKE 1 TABLET BY MOUTH EVERY DAY     Psychiatry: Antidepressants - Serotonin Modulator Failed - 09/04/2018  9:21 AM      Failed - Completed PHQ-2 or PHQ-9 in the last 360 days.      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

## 2018-09-09 ENCOUNTER — Other Ambulatory Visit: Payer: Self-pay | Admitting: Family Medicine

## 2018-09-09 NOTE — Telephone Encounter (Signed)
Forwarding medication refills to provider for review. 

## 2018-09-22 ENCOUNTER — Other Ambulatory Visit: Payer: Self-pay | Admitting: Family Medicine

## 2018-11-26 ENCOUNTER — Other Ambulatory Visit: Payer: Self-pay | Admitting: Nurse Practitioner

## 2018-11-28 ENCOUNTER — Other Ambulatory Visit: Payer: Self-pay | Admitting: Nurse Practitioner

## 2018-11-28 ENCOUNTER — Telehealth: Payer: Self-pay

## 2018-11-28 MED ORDER — TRAZODONE HCL 50 MG PO TABS: ORAL_TABLET | ORAL | 3 refills | Status: DC

## 2018-11-28 NOTE — Telephone Encounter (Signed)
Routing to provider  

## 2018-11-28 NOTE — Telephone Encounter (Signed)
Spoke with patient, he states that he has to take 1.5 of the trazodone at night to sleep well, he would like the prescription updated.

## 2018-11-28 NOTE — Telephone Encounter (Signed)
Patient notified

## 2018-11-28 NOTE — Telephone Encounter (Signed)
Updated and re sent

## 2018-11-28 NOTE — Telephone Encounter (Signed)
Requested medication (s) are due for refill today: yes  Requested medication (s) are on the active medication list: yes  Last refill:  09/04/2018  Future visit scheduled: yes  Notes to clinic:  Review for refill   Requested Prescriptions  Pending Prescriptions Disp Refills   traZODone (DESYREL) 50 MG tablet [Pharmacy Med Name: TRAZODONE 50 MG TABLET] 90 tablet 0    Sig: TAKE 1 TABLET BY MOUTH EVERY DAY     Psychiatry: Antidepressants - Serotonin Modulator Failed - 11/26/2018  9:37 AM      Failed - Completed PHQ-2 or PHQ-9 in the last 360 days.      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

## 2018-11-28 NOTE — Telephone Encounter (Signed)
Will provided 90 day supply, will need follow-up scheduled in this time period.

## 2018-12-02 ENCOUNTER — Other Ambulatory Visit: Payer: Self-pay | Admitting: Family Medicine

## 2018-12-02 ENCOUNTER — Other Ambulatory Visit: Payer: Self-pay | Admitting: Nurse Practitioner

## 2018-12-02 NOTE — Telephone Encounter (Signed)
Requested medication (s) are due for refill today: yes  Requested medication (s) are on the active medication list: yes  Last refill:  09/04/2018  Future visit scheduled: yes  Notes to clinic:  Review for refill Ordering provider and pcp are different    Requested Prescriptions  Pending Prescriptions Disp Refills   lisinopril (ZESTRIL) 20 MG tablet [Pharmacy Med Name: LISINOPRIL 20 MG TABLET] 90 tablet 1    Sig: TAKE 1 TABLET BY MOUTH EVERY DAY     Cardiovascular:  ACE Inhibitors Failed - 12/02/2018  1:34 AM      Failed - Cr in normal range and within 180 days    Creatinine, Ser  Date Value Ref Range Status  05/31/2018 1.16 0.76 - 1.27 mg/dL Final         Failed - K in normal range and within 180 days    Potassium  Date Value Ref Range Status  05/31/2018 4.0 3.5 - 5.2 mmol/L Final         Passed - Patient is not pregnant      Passed - Last BP in normal range    BP Readings from Last 1 Encounters:  06/27/18 130/78         Passed - Valid encounter within last 6 months    Recent Outpatient Visits          5 months ago Type 2 diabetes mellitus without complication, without long-term current use of insulin ([place])   [place], Barbaraann Faster, NP   6 months ago 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

## 2018-12-02 NOTE — Telephone Encounter (Signed)
Routing to provider  

## 2018-12-16 DIAGNOSIS — Z23 Encounter for immunization: Secondary | ICD-10-CM | POA: Diagnosis not present

## 2018-12-19 ENCOUNTER — Telehealth: Payer: Self-pay | Admitting: Nurse Practitioner

## 2018-12-19 NOTE — Telephone Encounter (Signed)
30 min OV when possible.  Thanks . 

## 2018-12-19 NOTE — Telephone Encounter (Signed)
Patient's wife called today asking about a new patient appointment with you. Advised her that you were closed right now to taking on new patient's but she is really wanting for her husband to see you.  She stated that her Son Gaetan Spieker is a patient of yours and he highly recommends you to the patient   I told her I could not guarantee anything since you are not accepting but would ask     C/B # [phone]

## 2018-12-26 ENCOUNTER — Encounter: Payer: Self-pay | Admitting: Nurse Practitioner

## 2018-12-30 ENCOUNTER — Ambulatory Visit (INDEPENDENT_AMBULATORY_CARE_PROVIDER_SITE_OTHER): Payer: Medicare Other | Admitting: Family Medicine

## 2018-12-30 ENCOUNTER — Other Ambulatory Visit: Payer: Self-pay | Admitting: Family Medicine

## 2018-12-30 ENCOUNTER — Other Ambulatory Visit: Payer: Self-pay

## 2018-12-30 ENCOUNTER — Encounter: Payer: Self-pay | Admitting: Family Medicine

## 2018-12-30 VITALS — BP 138/80 | HR 63 | Temp 97.4°F | Ht 68.9 in | Wt 163.1 lb

## 2018-12-30 DIAGNOSIS — N529 Male erectile dysfunction, unspecified: Secondary | ICD-10-CM | POA: Diagnosis not present

## 2018-12-30 DIAGNOSIS — Z7189 Other specified counseling: Secondary | ICD-10-CM | POA: Diagnosis not present

## 2018-12-30 DIAGNOSIS — E119 Type 2 diabetes mellitus without complications: Secondary | ICD-10-CM

## 2018-12-30 DIAGNOSIS — E1159 Type 2 diabetes mellitus with other circulatory complications: Secondary | ICD-10-CM

## 2018-12-30 DIAGNOSIS — I10 Essential (primary) hypertension: Secondary | ICD-10-CM | POA: Diagnosis not present

## 2018-12-30 DIAGNOSIS — I152 Hypertension secondary to endocrine disorders: Secondary | ICD-10-CM

## 2018-12-30 DIAGNOSIS — E782 Mixed hyperlipidemia: Secondary | ICD-10-CM | POA: Diagnosis not present

## 2018-12-30 LAB — POCT GLYCOSYLATED HEMOGLOBIN (HGB A1C): Hemoglobin A1C: 6.5 % — AB (ref 4.0–5.6)

## 2018-12-30 MED ORDER — SILDENAFIL CITRATE 20 MG PO TABS: 20.0000 mg | ORAL_TABLET | Freq: Every day | ORAL | 5 refills | Status: DC | PRN

## 2018-12-30 NOTE — Progress Notes (Signed)
New patient.  Establishing care.  Diabetes:  Using medications without difficulties: yes Hypoglycemic episodes: no, cautions d/w pt.   Hyperglycemic episodes:no Feet problems: some dec but not absent sensation in the feet.   Blood Sugars averaging: usually with dawn effect, higher in the AM.  Lower later in the day. eye exam within last year: to be done, d/w pt.   A1c done at office visit, discussed with patient.  Hypertension:    Using medication without problems or lightheadedness: yes Chest pain with exertion:no Edema:no Short of breath:no  Elevated Cholesterol: Using medications without problems: yes Muscle aches: no Diet compliance: yes Exercise:yes  Walking 3 miles at a time.   ED noted. No CP.  Discussed treatment options.  Gradual onset over the past year.  Not using nitroglycerin.  Colonoscopy done 2016 per patient report with 10-year follow-up recommended. Advance directive discussed with patient.  Wife designated if patient were incapacitated.  PMH and SH reviewed.   Vital signs, Meds and allergies reviewed.  ROS: Per HPI unless specifically indicated in ROS section   GEN: nad, alert and oriented HEENT: ncat NECK: supple w/o LA CV: rrr. PULM: ctab, no inc wob ABD: soft, +bs EXT: no edema SKIN: no acute rash  Diabetic foot exam: Normal inspection No skin breakdown No calluses  Normal DP pulses Normal sensation to light tough and monofilament except for distal toe dec sensitivity on B feet Nails normal

## 2018-12-30 NOTE — Patient Instructions (Signed)
Update me if sildenafil isn't helpful or tolerated.  Recheck in about 6 months at a physical.  Labs ahead of time.  Update me as needed.  Take care.  Glad to see you.

## 2019-01-02 ENCOUNTER — Encounter: Payer: Self-pay | Admitting: Family Medicine

## 2019-01-02 DIAGNOSIS — N529 Male erectile dysfunction, unspecified: Secondary | ICD-10-CM | POA: Insufficient documentation

## 2019-01-02 NOTE — Assessment & Plan Note (Signed)
Able to tolerate current medications.  No change in meds at this point.  Continue work on diet and exercise.  He agrees.

## 2019-01-02 NOTE — Assessment & Plan Note (Signed)
Recheck blood pressure controlled.  No change in meds.  Continue work on diet and exercise.

## 2019-01-02 NOTE — Assessment & Plan Note (Signed)
He can try sildenafil with routine cautions and instructions discussed with patient.  He agrees.  He will update me as needed.

## 2019-01-02 NOTE — Assessment & Plan Note (Signed)
Advance directive discussed with patient.  Wife designated if patient were incapacitated. 

## 2019-01-02 NOTE — Assessment & Plan Note (Addendum)
Controlled.  No hypoglycemia.  Continue as is.  Foot care discussed with patient.  Recheck in about 6 months.  He will update me as needed.  >30 minutes spent in face to face time with patient, >50% spent in counselling or coordination of care

## 2019-01-03 ENCOUNTER — Other Ambulatory Visit: Payer: Self-pay | Admitting: Unknown Physician Specialty

## 2019-01-03 DIAGNOSIS — E119 Type 2 diabetes mellitus without complications: Secondary | ICD-10-CM

## 2019-01-20 ENCOUNTER — Other Ambulatory Visit: Payer: Self-pay | Admitting: Nurse Practitioner

## 2019-01-20 NOTE — Telephone Encounter (Signed)
Looks like this patient established care at [facility] on 12/30/18, is that correct?

## 2019-01-20 NOTE — Telephone Encounter (Signed)
Yes, appears he established care elsewhere.  Will refuse refill at this time.

## 2019-01-20 NOTE — Telephone Encounter (Signed)
Requested medication (s) are due for refill today: yes  Requested medication (s) are on the active medication list: yes  Last refill:  11/28/2018  Future visit scheduled: no  Notes to clinic:  Requesting 90 day supply   Requested Prescriptions  Pending Prescriptions Disp Refills   traZODone (DESYREL) 50 MG tablet [Pharmacy Med Name: TRAZODONE 50 MG TABLET] 135 tablet 3    Sig: TAKE 1 TO 1.5 TABLETS BY MOUTH EVERY NIGHT FOR SLEEP     Psychiatry: Antidepressants - Serotonin Modulator Failed - 01/20/2019  2:35 PM      Failed - Valid encounter within last 6 months    Recent Outpatient Visits          6 months ago Type 2 diabetes mellitus without complication, without long-term current use of insulin ([place])   [place], Barbaraann Faster, NP   8 months ago 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

## 2019-01-21 ENCOUNTER — Other Ambulatory Visit: Payer: Self-pay | Admitting: Nurse Practitioner

## 2019-02-07 DIAGNOSIS — H524 Presbyopia: Secondary | ICD-10-CM | POA: Diagnosis not present

## 2019-02-07 DIAGNOSIS — E089 Diabetes mellitus due to underlying condition without complications: Secondary | ICD-10-CM | POA: Diagnosis not present

## 2019-02-07 DIAGNOSIS — H2513 Age-related nuclear cataract, bilateral: Secondary | ICD-10-CM | POA: Diagnosis not present

## 2019-02-07 DIAGNOSIS — E119 Type 2 diabetes mellitus without complications: Secondary | ICD-10-CM | POA: Diagnosis not present

## 2019-02-07 DIAGNOSIS — H02834 Dermatochalasis of left upper eyelid: Secondary | ICD-10-CM | POA: Diagnosis not present

## 2019-02-07 LAB — HM DIABETES EYE EXAM

## 2019-04-09 ENCOUNTER — Other Ambulatory Visit: Payer: Self-pay | Admitting: Nurse Practitioner

## 2019-06-03 ENCOUNTER — Other Ambulatory Visit: Payer: Self-pay | Admitting: Nurse Practitioner

## 2019-06-03 ENCOUNTER — Other Ambulatory Visit: Payer: Self-pay | Admitting: Family Medicine

## 2019-06-03 DIAGNOSIS — G4700 Insomnia, unspecified: Secondary | ICD-10-CM

## 2019-06-03 NOTE — Telephone Encounter (Signed)
[facility] no longer fills for this practice

## 2019-06-06 NOTE — Telephone Encounter (Signed)
Electronic refill request. Trazodone Last office visit:   12/30/2018 Last Filled:    90 tablet 3 11/28/2018  6 mos FU scheduled 07/18/2019 Please advise.

## 2019-06-07 ENCOUNTER — Encounter: Payer: Self-pay | Admitting: Nurse Practitioner

## 2019-06-07 DIAGNOSIS — G4700 Insomnia, unspecified: Secondary | ICD-10-CM | POA: Insufficient documentation

## 2019-06-07 NOTE — Telephone Encounter (Signed)
Sent. Thanks.   

## 2019-06-30 ENCOUNTER — Ambulatory Visit: Payer: Medicare Other | Admitting: Family Medicine

## 2019-07-10 ENCOUNTER — Ambulatory Visit: Payer: Medicare Other | Admitting: Family Medicine

## 2019-07-18 ENCOUNTER — Ambulatory Visit (INDEPENDENT_AMBULATORY_CARE_PROVIDER_SITE_OTHER): Payer: Medicare Other | Admitting: Family Medicine

## 2019-07-18 ENCOUNTER — Other Ambulatory Visit: Payer: Self-pay

## 2019-07-18 ENCOUNTER — Encounter: Payer: Self-pay | Admitting: Family Medicine

## 2019-07-18 VITALS — BP 140/72 | HR 60 | Temp 97.3°F | Ht 68.9 in | Wt 166.2 lb

## 2019-07-18 DIAGNOSIS — E119 Type 2 diabetes mellitus without complications: Secondary | ICD-10-CM | POA: Diagnosis not present

## 2019-07-18 LAB — POCT GLYCOSYLATED HEMOGLOBIN (HGB A1C): Hemoglobin A1C: 6.7 % — AB (ref 4.0–5.6)

## 2019-07-18 NOTE — Patient Instructions (Addendum)
Thanks for your effort.  Recheck at a physical in about 6 months.  Update me as needed.  Take care.  Glad to see you.

## 2019-07-18 NOTE — Progress Notes (Signed)
This visit occurred during the SARS-CoV-2 public health emergency.  Safety protocols were in place, including screening questions prior to the visit, additional usage of staff PPE, and extensive cleaning of exam room while observing appropriate contact time as indicated for disinfecting solutions.  Diabetes:  Using medications without difficulties: yes Hypoglycemic episodes: only if prolonged fasting, this is rare, then down to the 70s.  Hyperglycemic episodes: no Feet problems: some numbness noted.   Blood Sugars averaging:  He usually has higher sugars in the AMs.  eye exam within last year: done late 2020 (Dr Lew Dawes at [facility] in [place]) A1c 6.7. d/w at OV.   He is exercising and on consistent diet.    We talked about prostate cancer screening.  We talked about pros and cons.  It is generally not advised for routine mass screening for gentleman above age 47 especially with no family history.  Defer for now.  He agrees.  Meds, vitals, and allergies reviewed.   ROS: Per HPI unless specifically indicated in ROS section   GEN: nad, alert and oriented HEENT: ncat NECK: supple w/o LA CV: rrr. PULM: ctab, no inc wob ABD: soft, +bs EXT: no edema SKIN: no acute rash

## 2019-07-20 NOTE — Assessment & Plan Note (Signed)
A1c 6.7. d/w at OV.   He is exercising and on consistent diet.   Continue glimepiride, Metformin, Januvia.  Recheck periodically.

## 2019-08-08 ENCOUNTER — Encounter: Payer: Self-pay | Admitting: Family Medicine

## 2019-08-18 ENCOUNTER — Other Ambulatory Visit: Payer: Self-pay | Admitting: Nurse Practitioner

## 2019-08-31 ENCOUNTER — Other Ambulatory Visit: Payer: Self-pay | Admitting: Family Medicine

## 2019-08-31 NOTE — Telephone Encounter (Signed)
Electronic refill request. Trazodone Last office visit:  07/18/2019  Last Filled:    90 tablet 3 06/07/2019  By Aura Dials, NP

## 2019-09-01 NOTE — Telephone Encounter (Signed)
Sent. Thanks.   

## 2019-09-03 ENCOUNTER — Other Ambulatory Visit: Payer: Self-pay | Admitting: Family Medicine

## 2019-09-07 ENCOUNTER — Encounter: Payer: Self-pay | Admitting: Family Medicine

## 2019-10-05 DIAGNOSIS — E089 Diabetes mellitus due to underlying condition without complications: Secondary | ICD-10-CM | POA: Diagnosis not present

## 2019-10-05 DIAGNOSIS — E119 Type 2 diabetes mellitus without complications: Secondary | ICD-10-CM | POA: Diagnosis not present

## 2019-10-05 DIAGNOSIS — H524 Presbyopia: Secondary | ICD-10-CM | POA: Diagnosis not present

## 2019-10-05 DIAGNOSIS — H2513 Age-related nuclear cataract, bilateral: Secondary | ICD-10-CM | POA: Diagnosis not present

## 2019-10-24 LAB — HM DIABETES EYE EXAM

## 2019-10-25 ENCOUNTER — Other Ambulatory Visit: Payer: Self-pay | Admitting: Family Medicine

## 2019-11-27 ENCOUNTER — Encounter: Payer: Self-pay | Admitting: Family Medicine

## 2019-12-04 DIAGNOSIS — Z23 Encounter for immunization: Secondary | ICD-10-CM | POA: Diagnosis not present

## 2019-12-05 ENCOUNTER — Encounter: Payer: Self-pay | Admitting: Family Medicine

## 2019-12-07 ENCOUNTER — Other Ambulatory Visit: Payer: Self-pay | Admitting: Family Medicine

## 2019-12-07 MED ORDER — RYBELSUS 7 MG PO TABS: ORAL_TABLET | ORAL | 3 refills | Status: DC

## 2019-12-20 ENCOUNTER — Other Ambulatory Visit: Payer: Self-pay | Admitting: Family Medicine

## 2019-12-20 DIAGNOSIS — E119 Type 2 diabetes mellitus without complications: Secondary | ICD-10-CM

## 2019-12-28 ENCOUNTER — Encounter: Payer: Self-pay | Admitting: Family Medicine

## 2019-12-29 ENCOUNTER — Other Ambulatory Visit: Payer: Self-pay

## 2019-12-29 ENCOUNTER — Encounter: Payer: Self-pay | Admitting: Emergency Medicine

## 2019-12-29 ENCOUNTER — Telehealth: Payer: Self-pay | Admitting: Family Medicine

## 2019-12-29 ENCOUNTER — Emergency Department
Admission: EM | Admit: 2019-12-29 | Discharge: 2019-12-29 | Disposition: A | Discharge status: Home / Self Care | Payer: Medicare Other | Attending: Emergency Medicine | Admitting: Emergency Medicine

## 2019-12-29 ENCOUNTER — Ambulatory Visit
Admission: EM | Admit: 2019-12-29 | Discharge: 2019-12-29 | Disposition: A | Discharge status: Home / Self Care | Payer: Medicare Other | Attending: Family Medicine | Admitting: Family Medicine

## 2019-12-29 ENCOUNTER — Emergency Department: Payer: Medicare Other

## 2019-12-29 ENCOUNTER — Other Ambulatory Visit: Payer: Self-pay | Admitting: Family Medicine

## 2019-12-29 ENCOUNTER — Ambulatory Visit (INDEPENDENT_AMBULATORY_CARE_PROVIDER_SITE_OTHER)
Admission: EM | Admit: 2019-12-29 | Discharge: 2019-12-29 | Disposition: A | Discharge status: Other Acute Inpatient Hospital | Payer: Medicare Other | Source: Home / Self Care | Attending: Family Medicine | Admitting: Family Medicine

## 2019-12-29 DIAGNOSIS — E119 Type 2 diabetes mellitus without complications: Secondary | ICD-10-CM | POA: Insufficient documentation

## 2019-12-29 DIAGNOSIS — J449 Chronic obstructive pulmonary disease, unspecified: Secondary | ICD-10-CM | POA: Diagnosis not present

## 2019-12-29 DIAGNOSIS — R079 Chest pain, unspecified: Secondary | ICD-10-CM

## 2019-12-29 DIAGNOSIS — I447 Left bundle-branch block, unspecified: Secondary | ICD-10-CM

## 2019-12-29 DIAGNOSIS — Z7984 Long term (current) use of oral hypoglycemic drugs: Secondary | ICD-10-CM | POA: Diagnosis not present

## 2019-12-29 DIAGNOSIS — R0789 Other chest pain: Secondary | ICD-10-CM

## 2019-12-29 DIAGNOSIS — I10 Essential (primary) hypertension: Secondary | ICD-10-CM | POA: Insufficient documentation

## 2019-12-29 DIAGNOSIS — R778 Other specified abnormalities of plasma proteins: Secondary | ICD-10-CM | POA: Insufficient documentation

## 2019-12-29 DIAGNOSIS — I208 Other forms of angina pectoris: Secondary | ICD-10-CM

## 2019-12-29 DIAGNOSIS — Z79899 Other long term (current) drug therapy: Secondary | ICD-10-CM | POA: Diagnosis not present

## 2019-12-29 DIAGNOSIS — I209 Angina pectoris, unspecified: Secondary | ICD-10-CM | POA: Insufficient documentation

## 2019-12-29 DIAGNOSIS — Z20822 Contact with and (suspected) exposure to covid-19: Secondary | ICD-10-CM | POA: Diagnosis not present

## 2019-12-29 LAB — CBC WITH DIFFERENTIAL/PLATELET
Abs Immature Granulocytes: 0.03 10*3/uL (ref 0.00–0.07)
Basophils Absolute: 0.1 10*3/uL (ref 0.0–0.1)
Basophils Relative: 2 %
Eosinophils Absolute: 0.1 10*3/uL (ref 0.0–0.5)
Eosinophils Relative: 2 %
HCT: 42.7 % (ref 39.0–52.0)
Hemoglobin: 14.4 g/dL (ref 13.0–17.0)
Immature Granulocytes: 1 %
Lymphocytes Relative: 18 %
Lymphs Abs: 0.8 10*3/uL (ref 0.7–4.0)
MCH: 28.1 pg (ref 26.0–34.0)
MCHC: 33.7 g/dL (ref 30.0–36.0)
MCV: 83.2 fL (ref 80.0–100.0)
Monocytes Absolute: 0.7 10*3/uL (ref 0.1–1.0)
Monocytes Relative: 14 %
Neutro Abs: 2.9 10*3/uL (ref 1.7–7.7)
Neutrophils Relative %: 63 %
Platelets: 305 10*3/uL (ref 150–400)
RBC: 5.13 MIL/uL (ref 4.22–5.81)
RDW: 14 % (ref 11.5–15.5)
WBC: 4.5 10*3/uL (ref 4.0–10.5)
nRBC: 0 % (ref 0.0–0.2)

## 2019-12-29 LAB — COMPREHENSIVE METABOLIC PANEL
ALT: 20 U/L (ref 0–44)
AST: 20 U/L (ref 15–41)
Albumin: 4.8 g/dL (ref 3.5–5.0)
Alkaline Phosphatase: 30 U/L — ABNORMAL LOW (ref 38–126)
Anion gap: 9 (ref 5–15)
BUN: 23 mg/dL (ref 8–23)
CO2: 28 mmol/L (ref 22–32)
Calcium: 10.3 mg/dL (ref 8.9–10.3)
Chloride: 100 mmol/L (ref 98–111)
Creatinine, Ser: 1.13 mg/dL (ref 0.61–1.24)
GFR, Estimated: >60 mL/min (ref >60)
Glucose, Bld: 151 mg/dL — ABNORMAL HIGH (ref 70–99)
Potassium: 3.7 mmol/L (ref 3.5–5.1)
Sodium: 137 mmol/L (ref 135–145)
Total Bilirubin: 0.6 mg/dL (ref 0.3–1.2)
Total Protein: 7.5 g/dL (ref 6.5–8.1)

## 2019-12-29 LAB — RESPIRATORY PANEL BY RT PCR (FLU A&B, COVID)
Influenza A by PCR: NEGATIVE
Influenza B by PCR: NEGATIVE
SARS Coronavirus 2 by RT PCR: NEGATIVE

## 2019-12-29 LAB — TROPONIN I (HIGH SENSITIVITY)
Troponin I (High Sensitivity): 47 ng/L — ABNORMAL HIGH (ref <18)
Troponin I (High Sensitivity): 44 ng/L — ABNORMAL HIGH (ref <18)

## 2019-12-29 MED ORDER — ASPIRIN 81 MG PO CHEW
324.0000 mg | CHEWABLE_TABLET | Freq: Once | ORAL | Status: AC
  Administered 2019-12-29: 324 mg via ORAL

## 2019-12-29 NOTE — Telephone Encounter (Signed)
Dr. Over the past two weeks I'vd eperienced several bief episodes of tightness across my upper chest while doing my three-mile walk.  I stop for a minute and it goes away.  After 1.5 miles of walking it goes away and I can continue normally.  I have not started the Rybelsus yet as I am finishing up the Januvia. Please advise how I should proceed. Leory    We will call you about this.  I would limit any exertion in the meantime.  If you have more symptoms, then go to the ER or dial 911.    Clelia Croft  Last read by Dewayne Hatch at 8:17 AM on 12/29/2019. December 28, 2019     10:51 AM Annamarie Major, CMA routed this conversation to Joaquim Nam, MD  Dewayne Hatch to Joaquim Nam, MD   [facility]   10:36 AM This began when I contracted a cold on Sept 28.  The cold has since run its course.  Additional note:  One evening on going to bed I experienced the tightness, but it was relieved by taking two Tylenol 500 mg.

## 2019-12-29 NOTE — ED Triage Notes (Signed)
Patient c/o chest tightness across upper chest while exercising x  1 month.   Patient stated onset of symptoms began while exercising (3-mile walk) after getting over a cold.   Patient stated pain would occur for 1 minute and then subside.   Patient stated he had a single episode that occurred at night while sleeping. Patient took 1 tylenol tablet and went back to sleep.   History of HTN.

## 2019-12-29 NOTE — ED Notes (Signed)
Pt taken by xray 

## 2019-12-29 NOTE — Telephone Encounter (Addendum)
I spoke with pt; no CP,SOB, H/A or dizziness; no chest tightness now; last chest tightness was couple of days ago and pt has not been exercising or walking since 12/25/19 or 12/26/19.pt has never had this type chest tightness before. The tightness is in upper part of chest all the way across when it occurs. No radiation or movement of tightness. Pt said when he does exert himself the heart rate will go from the 60's to 150. After rest heart rate starts to go down. Now BP 144/72 P 60. Pt taking all meds as instructed. Pt is in no distress now and does not want to go to ED or UC. I spoke with Dr Para March and he advised to go to [facility] for eval now and if pt is in agreement would start cardiology referral. Pt agreed and his wife will take pt to [facility] [address]; pt is in agreement with starting cardiology referral and pt scheduled UC FU on 01/02/20 at 3pm with Dr Para March. UC & ED precautions also given for now and prior to appt with Dr Para March on 01/02/20.pt is appreciative and sending note back to Dr Para March to start cardiology referral. Pt has no covid symptoms and no known exposure to + covid. Pt has had 3 pfizer covid vaccines.

## 2019-12-29 NOTE — ED Triage Notes (Signed)
Pt seen at Urgent care today. Pt c/o chest pain with exertion x 1 month. Elevated trop at Urgent care

## 2019-12-29 NOTE — Discharge Instructions (Signed)
You were seen in the ED because of your chest pain.  As we discussed, this is concerning for early heart disease.  Thankfully, our heart testing here did not show any significant strain/damage to your heart such that it should be safe to follow-up with a cardiologist in the clinic in the next 1-2 weeks.  I have attached the phone number for Dr. Gwen Pounds, one of our local cardiologists with [facility] clinic.  Please call his office as soon as possible to set up this follow-up appointment.  I would recommend that you add a baby aspirin, 81 mg, to your daily medication regimen.  Continue all of your other medications on top of this.  If you develop any further worsening symptoms despite this, please return to the ED immediately.

## 2019-12-29 NOTE — ED Provider Notes (Signed)
[facility]    CSN: [id number] Arrival date & time: 12/29/19  1122      History   Chief Complaint Chief Complaint  Patient presents with   Chest Pain   HPI  79 year old male with hypertension, diabetes, hyperlipidemia presents with chest tightness.  Patient states that he has had this intermittently over the past 2 weeks.  He reports chest tightness particularly with his daily walk.  He states that he walks approximately 1 mile and then has chest tightness/pain.  He denies shortness of breath.  He states that this all started after he resumed exercising after suffering a cold.  He states that he had a negative Covid test at the time.  Patient was sent to urgent care for EKG and further evaluation.  No other reported symptoms.  Patient does note that he had a bout of chest tightness during his sleep 2 nights ago.  No other complaints at this time.  Past Medical History:  Diagnosis Date   Diabetes mellitus without complication (HCC)    Hyperlipidemia    Hypertension    Insomnia     Patient Active Problem List   Diagnosis Date Noted   Insomnia    Erectile dysfunction 01/02/2019   Type 2 diabetes mellitus without complication, without long-term current use of insulin (HCC) 06/27/2018   Advanced care planning/counseling discussion 06/16/2017   BPH (benign prostatic hyperplasia) 06/08/2016   Hyperlipemia 10/25/2015   Hypertension associated with diabetes (HCC) 10/25/2015    Past Surgical History:  Procedure Laterality Date   LAMINECTOMY  1993   L5-S1   TONSILLECTOMY         Home Medications    Prior to Admission medications   Medication Sig Start Date End Date Taking? Authorizing Provider  amLODipine (NORVASC) 10 MG tablet TAKE 1 TABLET BY MOUTH EVERY DAY 09/12/18  Yes Crissman, Redge Gainer, MD  Cholecalciferol (VITAMIN D3) 1.25 MG (50000 UT) CAPS Take by mouth daily.   Yes [provider]  Coenzyme Q10 (COQ10 PO) Take by mouth daily.    Yes [provider]  fenofibrate 160 MG tablet TAKE 1 TABLET BY MOUTH EVERY DAY 09/12/18  Yes Crissman, Redge Gainer, MD  glimepiride (AMARYL) 4 MG tablet TAKE 1 TABLET BY MOUTH EVERY DAY WITH BREAKFAST 08/31/19  Yes Joaquim Nam, MD  lisinopril (ZESTRIL) 20 MG tablet TAKE 1 TABLET BY MOUTH EVERY DAY 10/26/19  Yes Joaquim Nam, MD  lisinopril-hydrochlorothiazide (ZESTORETIC) 20-25 MG tablet TAKE 1 TABLET BY MOUTH EVERY DAY 09/12/18  Yes Steele Sizer, MD  metFORMIN (GLUCOPHAGE) 1000 MG tablet TAKE 1 TABLET BY MOUTH TWICE A DAY 08/22/19  Yes Joaquim Nam, MD  metoprolol tartrate (LOPRESSOR) 50 MG tablet TAKE 1 TABLET BY MOUTH TWICE A DAY 08/22/19  Yes Joaquim Nam, MD  Misc Natural Products (URINOZINC PO) Take by mouth in the morning and at bedtime.   Yes [provider]  Multiple Vitamin (MULTIVITAMIN) tablet Take 1 tablet by mouth daily.   Yes [provider]  pravastatin (PRAVACHOL) 20 MG tablet TAKE 1 TABLET BY MOUTH EVERY DAY 09/12/18  Yes Crissman, Redge Gainer, MD  Semaglutide (RYBELSUS) 7 MG TABS 1/2 tab daily for 1 months then 1 tab daily thereafter.  Take in the AM. 12/07/19  Yes Joaquim Nam, MD  sildenafil (REVATIO) 20 MG tablet Take 1-5 tablets (20-100 mg total) by mouth daily as needed. 12/30/18  Yes Joaquim Nam, MD  traZODone (DESYREL) 50 MG tablet TAKE 1  TO 1.5 TABLETS BY MOUTH EVERY NIGHT FOR SLEEP 09/01/19  Yes Joaquim Nam, MD  Multiple Vitamins-Minerals (PRESERVISION AREDS 2 PO) Take by mouth 2 (two) times daily.    [provider]  [facility] ULTRA test strip TEST TWICE A DAY. MAX PER INS 1/DAY 01/03/19   Joaquim Nam, MD    Family History Family History  Problem Relation Age of Onset   Hypertension Mother    Cancer Neg Hx    COPD Neg Hx    Diabetes Neg Hx    Heart disease Neg Hx    Hyperlipidemia Neg Hx    Stroke Neg Hx    Colon cancer Neg Hx    Prostate cancer Neg Hx     Social History Social History    Tobacco Use   Smoking status: Never Smoker   Smokeless tobacco: Never Used  Building services engineer Use: Never used  Substance Use Topics   Alcohol use: Yes    Comment: Very rare- occasionaly beer or wine    Drug use: No     Allergies   Amoxil [amoxicillin]   Review of Systems Review of Systems  Constitutional: Negative.   Respiratory: Positive for chest tightness.    Physical Exam Triage Vital Signs ED Triage Vitals  Enc Vitals Group     BP 12/29/19 1212 (!) 167/77     Pulse Rate 12/29/19 1212 61     Resp 12/29/19 1212 16     Temp 12/29/19 1212 98.2 F (36.8 C)     Temp Source 12/29/19 1212 Oral     SpO2 12/29/19 1212 97 %     Weight 12/29/19 1210 165 lb (74.8 kg)     Height 12/29/19 1210 5' 9.5" (1.765 m)     Head Circumference --      Peak Flow --      Pain Score 12/29/19 1210 0     Pain Loc --      Pain Edu? --      Excl. in GC? --    Updated Vital Signs BP (!) 167/77 (BP Location: Left Arm)    Pulse 61    Temp 98.2 F (36.8 C) (Oral)    Resp 16    Ht 5' 9.5" (1.765 m)    Wt 74.8 kg    SpO2 97%    BMI 24.02 kg/m   Visual Acuity Right Eye Distance:   Left Eye Distance:   Bilateral Distance:    Right Eye Near:   Left Eye Near:    Bilateral Near:     Physical Exam Vitals and nursing note reviewed.  Constitutional:      General: He is not in acute distress.    Appearance: Normal appearance. He is not ill-appearing.  HENT:     Head: Normocephalic and atraumatic.  Eyes:     General:        Right eye: No discharge.        Left eye: No discharge.     Conjunctiva/sclera: Conjunctivae normal.  Cardiovascular:     Rate and Rhythm: Normal rate and regular rhythm.  Pulmonary:     Effort: Pulmonary effort is normal.     Breath sounds: Normal breath sounds. No wheezing, rhonchi or rales.  Neurological:     Mental Status: He is alert.  Psychiatric:        Mood and Affect: Mood normal.        Behavior: Behavior normal.    UC Treatments /  Results   Labs (all labs ordered are listed, but only abnormal results are displayed) Labs Reviewed  COMPREHENSIVE METABOLIC PANEL - Abnormal; Notable for the following components:      Result Value   Glucose, Bld 151 (*)    Alkaline Phosphatase 30 (*)    All other components within normal limits  TROPONIN I (HIGH SENSITIVITY) - Abnormal; Notable for the following components:   Troponin I (High Sensitivity) 47 (*)    All other components within normal limits  CBC WITH DIFFERENTIAL/PLATELET  TROPONIN I (HIGH SENSITIVITY)    EKG Interpretation: Sinus bradycardia at the rate of 57.  Left bundle branch block.  Radiology No results found.  Procedures Procedures (including critical care time)  Medications Ordered in UC Medications  aspirin chewable tablet 324 mg (324 mg Oral Given 12/29/19 1337)    Initial Impression / Assessment and Plan / UC Course  I have reviewed the triage vital signs and the nursing notes.  Pertinent labs & imaging results that were available during my care of the patient were reviewed by me and considered in my medical decision making (see chart for details).    79 year old male presents with ongoing exertional chest tightness.  EKG was obtained today and revealed left bundle branch block.  It is unsure whether this is new or whether this is an old finding.  Patient seems to feel like it is an old finding although we have nothing to compare to.  Patient hemodynamically stable.  Labs obtained and revealed elevated troponin of 47.  Patient given aspirin.  IV placed.  Patient is going to the hospital for further evaluation.  He is being transported via EMS.  Final Clinical Impressions(s) / UC Diagnoses   Final diagnoses:  Chest pain, unspecified type  Elevated troponin  LBBB (left bundle branch block)   Discharge Instructions   None    ED Prescriptions    None     PDMP not reviewed this encounter.   Tommie Sams, [place] 12/29/19 1419

## 2019-12-29 NOTE — Telephone Encounter (Signed)
Electronic refill request. Sildenafil Last office visit:   07/18/2019 Last Filled:  50 tablet 5 12/30/2018

## 2019-12-29 NOTE — Telephone Encounter (Signed)
See mychart message about chest sx.  Please triage patient.  Please give routine ER cautions.  Thanks.

## 2019-12-29 NOTE — ED Notes (Signed)
Patient is being discharged from the Urgent Care and sent to the Emergency Department via EMS . Per Dr. Adriana Simas, patient is in need of higher level of care due to Elevated troponin, chest pressure. Patient is aware and verbalizes understanding of plan of care.  Vitals:   12/29/19 1212  BP: (!) 167/77  Pulse: 61  Resp: 16  Temp: 98.2 F (36.8 C)  SpO2: 97%

## 2019-12-29 NOTE — ED Provider Notes (Signed)
[facility] Emergency Department Provider Note ____________________________________________   First MD Initiated Contact with Patient 12/29/19 1420     (approximate)  I have reviewed the triage vital signs and the nursing notes.  HISTORY  Chief Complaint Chest Pain   HPI Luis Wise is a 79 y.o. malewho presents to the ED for evaluation of chest pain.   Chart review indicates history of HTN, HLD and DM on oral agents.  No personal history of ACS and he has never seen a cardiologist.  No family history of early cardiac death.  No cigarette smoking history.  Patient reports developing a "cold" at the beginning of the month, which resolved after a few days.  He reports subsequently over the past 3 weeks he has developed substernal intermittent chest tightness oftentimes when he walks.  He reports a history of walking daily at least 3 miles, but for the past couple weeks he has sometimes developed chest tightness while ambulating that resolves with rest.  He denies any associated symptoms such as nausea, diaphoresis, vomiting, syncope, palpitations or headache.  He reports feeling a sensation of sharp chest pain overnight while awakening from sleep 2 nights ago, and because of this on top of his exertional pain his wife brought him to the ED for evaluation.  Of note, he was seen at [facility] urgent care prior to my evaluation and had blood work drawn demonstrating a CBC and CMP without evidence of acute pathology.  High-sensitivity troponin drawn at noon with a value of 47.  Denies any chest pain or complaints here in the ED and reports feeling well.    Past Medical History:  Diagnosis Date  . Diabetes mellitus without complication (HCC)   . Hyperlipidemia   . Hypertension   . Insomnia     Patient Active Problem List   Diagnosis Date Noted  . Insomnia   . Erectile dysfunction 01/02/2019  . Type 2 diabetes mellitus without complication, without long-term  current use of insulin (HCC) 06/27/2018  . Advanced care planning/counseling discussion 06/16/2017  . BPH (benign prostatic hyperplasia) 06/08/2016  . Hyperlipemia 10/25/2015  . Hypertension associated with diabetes (HCC) 10/25/2015    Past Surgical History:  Procedure Laterality Date  . LAMINECTOMY  1993   L5-S1  . TONSILLECTOMY      Prior to Admission medications   Medication Sig Start Date End Date Taking? Authorizing Provider  amLODipine (NORVASC) 10 MG tablet TAKE 1 TABLET BY MOUTH EVERY DAY 09/12/18   Steele Sizer, MD  Cholecalciferol (VITAMIN D3) 1.25 MG (50000 UT) CAPS Take by mouth daily.    [provider]  Coenzyme Q10 (COQ10 PO) Take by mouth daily.    [provider]  fenofibrate 160 MG tablet TAKE 1 TABLET BY MOUTH EVERY DAY 09/12/18   Steele Sizer, MD  glimepiride (AMARYL) 4 MG tablet TAKE 1 TABLET BY MOUTH EVERY DAY WITH BREAKFAST 08/31/19   Joaquim Nam, MD  lisinopril (ZESTRIL) 20 MG tablet TAKE 1 TABLET BY MOUTH EVERY DAY 10/26/19   Joaquim Nam, MD  lisinopril-hydrochlorothiazide (ZESTORETIC) 20-25 MG tablet TAKE 1 TABLET BY MOUTH EVERY DAY 09/12/18   Steele Sizer, MD  metFORMIN (GLUCOPHAGE) 1000 MG tablet TAKE 1 TABLET BY MOUTH TWICE A DAY 08/22/19   Joaquim Nam, MD  metoprolol tartrate (LOPRESSOR) 50 MG tablet TAKE 1 TABLET BY MOUTH TWICE A DAY 08/22/19   Joaquim Nam, MD  Misc Natural Products (URINOZINC PO) Take by mouth in  the morning and at bedtime.    [provider]  Multiple Vitamin (MULTIVITAMIN) tablet Take 1 tablet by mouth daily.    [provider]  Multiple Vitamins-Minerals (PRESERVISION AREDS 2 PO) Take by mouth 2 (two) times daily.    [provider]  [facility] ULTRA test strip TEST TWICE A DAY. MAX PER INS 1/DAY 01/03/19   Joaquim Nam, MD  pravastatin (PRAVACHOL) 20 MG tablet TAKE 1 TABLET BY MOUTH EVERY DAY 09/12/18   Crissman, Redge Gainer, MD  Semaglutide (RYBELSUS) 7 MG TABS 1/2 tab  daily for 1 months then 1 tab daily thereafter.  Take in the AM. 12/07/19   Joaquim Nam, MD  sildenafil (REVATIO) 20 MG tablet Take 1-5 tablets (20-100 mg total) by mouth daily as needed. 12/30/18   Joaquim Nam, MD  traZODone (DESYREL) 50 MG tablet TAKE 1 TO 1.5 TABLETS BY MOUTH EVERY NIGHT FOR SLEEP 09/01/19   Joaquim Nam, MD    Allergies Amoxil [amoxicillin]  Family History  Problem Relation Age of Onset  . Hypertension Mother   . Cancer Neg Hx   . COPD Neg Hx   . Diabetes Neg Hx   . Heart disease Neg Hx   . Hyperlipidemia Neg Hx   . Stroke Neg Hx   . Colon cancer Neg Hx   . Prostate cancer Neg Hx     Social History Social History   Tobacco Use  . Smoking status: Never Smoker  . Smokeless tobacco: Never Used  Vaping Use  . Vaping Use: Never used  Substance Use Topics  . Alcohol use: Yes    Comment: Very rare- occasionaly beer or wine   . Drug use: No    Review of Systems  Constitutional: No fever/chills Eyes: No visual changes. ENT: No sore throat. Cardiovascular: Positive for chest pain. Respiratory: Denies shortness of breath. Gastrointestinal: No abdominal pain.  No nausea, no vomiting.  No diarrhea.  No constipation. Genitourinary: Negative for dysuria. Musculoskeletal: Negative for back pain. Skin: Negative for rash. Neurological: Negative for headaches, focal weakness or numbness. ____________________________________________   PHYSICAL EXAM:  VITAL SIGNS: Vitals:   12/29/19 1426  BP: (!) 191/92  Pulse: 65  Resp: 18  SpO2: 98%     Constitutional: Alert and oriented. Well appearing and in no acute distress.  Pleasant and conversational in full sentences. Eyes: Conjunctivae are normal. PERRL. EOMI. Head: Atraumatic. Nose: No congestion/rhinnorhea. Mouth/Throat: Mucous membranes are moist.  Oropharynx non-erythematous. Neck: No stridor. No cervical spine tenderness to palpation. Cardiovascular: Normal rate, regular rhythm. Grossly  normal heart sounds.  Good peripheral circulation. Respiratory: Normal respiratory effort.  No retractions. Lungs CTAB. Gastrointestinal: Soft , nondistended, nontender to palpation. No abdominal bruits. No CVA tenderness. Musculoskeletal: No lower extremity tenderness nor edema.  No joint effusions. No signs of acute trauma. Neurologic:  Normal speech and language. No gross focal neurologic deficits are appreciated. No gait instability noted. Skin:  Skin is warm, dry and intact. No rash noted. Psychiatric: Mood and affect are normal. Speech and behavior are normal.  ____________________________________________   LABS (all labs ordered are listed, but only abnormal results are displayed)  Labs Reviewed  TROPONIN I (HIGH SENSITIVITY) - Abnormal; Notable for the following components:      Result Value   Troponin I (High Sensitivity) 44 (*)    All other components within normal limits  RESPIRATORY PANEL BY RT PCR (FLU A&B, COVID)   ____________________________________________  12 Lead EKG  Sinus rhythm, rate  of 59 bpm.  Normal axis.  Left upper branch block is present, otherwise normal intervals.  No evidence of acute ischemia per Sgarbossa criteria. ____________________________________________  RADIOLOGY  ED MD interpretation: 1 view CXR with no evidence of acute cardiopulmonary pathology, by my read.  Official radiology report(s): DG Chest 2 View  Result Date: 12/29/2019 CLINICAL DATA:  Exertional chest pain EXAM: CHEST - 2 VIEW COMPARISON:  None. FINDINGS: Cardiac shadow is within normal limits. Aortic calcifications are seen. The lungs are hyperinflated bilaterally. No focal infiltrate or effusion is seen. No bony abnormality is noted. IMPRESSION: COPD without acute abnormality. Electronically Signed   By: Alcide Clever M.D.   On: 12/29/2019 15:33    ____________________________________________   PROCEDURES and INTERVENTIONS  Procedure(s) performed (including Critical  Care):  Procedures  Medications - No data to display  ____________________________________________   MDM / ED COURSE  79 year old man with HTN, HLD and DM presents with evidence of stable angina amenable to outpatient management with cardiology follow-up.  Patient chest pain-free here in the ED and has no complaints.  Patient looks well and has no evidence of acute pathology on examination.  Blood work reviewed from urgent care earlier today without evidence of acute pathology.  Patient had troponin drawn at noon with a value of 47, and repeat here is decreasing with a value of 45.  His EKG is nonischemic and demonstrates a left bundle branch block.  CXR without pathology.  Patient symptoms are most consistent with stable angina and he is amenable to outpatient management, which I think is reasonable.  We discussed reduction in his exercise and activities that do precipitate his chest pain, we discussed the addition of a baby aspirin to his regimen and following up with cardiology in the next 1 week.  We discussed return precautions for the ED.  Patient is medically stable for discharge home.  Clinical Course as of Dec 28 1601  Fri Dec 29, 2019  1556 Reassessed.  Patient reports he continues to feel well and has no chest pain.  We discussed his downtrending troponins and diagnosis of stable angina.  We discussed the possibility of observation medical admission, but patient prefers to be managed as an outpatient to follow-up with cardiology within the next 1-2 weeks.  We discussed return precautions for the ED that would suggest unstable angina or worsening of ACS.  We discussed the addition of a baby aspirin to his regimen prophylactically.    [DS]    Clinical Course User Index [DS] Delton Prairie, MD     ____________________________________________   FINAL CLINICAL IMPRESSION(S) / ED DIAGNOSES  Final diagnoses:  Stable angina (HCC)  Other chest pain     ED Discharge Orders    None         Ledora Delker   Note:  This document was prepared using Dragon voice recognition software and may include unintentional dictation errors.   Delton Prairie, MD 12/29/19 [phone]

## 2019-12-29 NOTE — ED Triage Notes (Signed)
Patient c/o tightness in his chest off and on for a month.  Patient states that tightness in his chest started after having a cold on 12/05/19.  Patient states that he has experienced that tightness in his chest after walking or exercising and also at rest.  Patient denies SOB.  Patient denies chest pain at this time.

## 2019-12-31 ENCOUNTER — Encounter: Payer: Self-pay | Admitting: Family Medicine

## 2019-12-31 ENCOUNTER — Telehealth: Payer: Self-pay | Admitting: Family Medicine

## 2019-12-31 DIAGNOSIS — I209 Angina pectoris, unspecified: Secondary | ICD-10-CM

## 2019-12-31 DIAGNOSIS — I200 Unstable angina: Secondary | ICD-10-CM | POA: Insufficient documentation

## 2019-12-31 NOTE — Telephone Encounter (Signed)
See note about cardiology referral.  Given his recent events, I declined this medication, especially in case he needs to use any nitroglycerin.  Thanks.

## 2019-12-31 NOTE — Telephone Encounter (Signed)
Needs referral to/eval by cards this week.  ER visit for angina.  I put in the referral.  Thanks.

## 2019-12-31 NOTE — Telephone Encounter (Signed)
See ER notes, cards referral placed in other phone note.

## 2020-01-01 NOTE — ED Provider Notes (Signed)
Renaldo Fiddler    CSN: [id number] Arrival date & time: 12/29/19  1008      History   Chief Complaint No chief complaint on file.   HPI Armari Fussell is a 79 y.o. male.   Reports that he has been experiencing chest pain with exercise on and off for the last month. Also reports one episode of chest pain that woke him from sleep. Reports that he took tylenol and went back to sleep. Has history of diabetes, hypertension, hyperlipidemia. All are medication controlled. Denies ever having this type of chest pain before. Reports that he called his PCP and was told to go to urgent care or the ER for an EKG. States that he does not want to go to the ER. Denies headache, SOB, radiating chest pain, diaphoresis, loss or change in sensation to his extremities. Denies current chest pain.  ROS per HPI  The history is provided by the patient.    Past Medical History:  Diagnosis Date  . Diabetes mellitus without complication (HCC)   . Hyperlipidemia   . Hypertension   . Insomnia     Patient Active Problem List   Diagnosis Date Noted  . Angina pectoris (HCC) 12/31/2019  . Insomnia   . Erectile dysfunction 01/02/2019  . Type 2 diabetes mellitus without complication, without long-term current use of insulin (HCC) 06/27/2018  . Advanced care planning/counseling discussion 06/16/2017  . BPH (benign prostatic hyperplasia) 06/08/2016  . Hyperlipemia 10/25/2015  . Hypertension associated with diabetes (HCC) 10/25/2015    Past Surgical History:  Procedure Laterality Date  . LAMINECTOMY  1993   L5-S1  . TONSILLECTOMY         Home Medications    Prior to Admission medications   Medication Sig Start Date End Date Taking? Authorizing Provider  amLODipine (NORVASC) 10 MG tablet TAKE 1 TABLET BY MOUTH EVERY DAY 09/12/18  Yes Crissman, Mark A, MD  lisinopril (ZESTRIL) 20 MG tablet TAKE 1 TABLET BY MOUTH EVERY DAY 10/26/19  Yes Joaquim Nam, MD  lisinopril-hydrochlorothiazide  (ZESTORETIC) 20-25 MG tablet TAKE 1 TABLET BY MOUTH EVERY DAY 09/12/18  Yes Steele Sizer, MD  metFORMIN (GLUCOPHAGE) 1000 MG tablet TAKE 1 TABLET BY MOUTH TWICE A DAY 08/22/19  Yes Joaquim Nam, MD  metoprolol tartrate (LOPRESSOR) 50 MG tablet TAKE 1 TABLET BY MOUTH TWICE A DAY 08/22/19  Yes Joaquim Nam, MD  Multiple Vitamin (MULTIVITAMIN) tablet Take 1 tablet by mouth daily.   Yes [provider]  pravastatin (PRAVACHOL) 20 MG tablet TAKE 1 TABLET BY MOUTH EVERY DAY 09/12/18  Yes Crissman, Redge Gainer, MD  Cholecalciferol (VITAMIN D3) 1.25 MG (50000 UT) CAPS Take by mouth daily.    [provider]  Coenzyme Q10 (COQ10 PO) Take by mouth daily.    [provider]  fenofibrate 160 MG tablet TAKE 1 TABLET BY MOUTH EVERY DAY 09/12/18   Steele Sizer, MD  glimepiride (AMARYL) 4 MG tablet TAKE 1 TABLET BY MOUTH EVERY DAY WITH BREAKFAST 08/31/19   Joaquim Nam, MD  Misc Natural Products (URINOZINC PO) Take by mouth in the morning and at bedtime.    [provider]  Multiple Vitamins-Minerals (PRESERVISION AREDS 2 PO) Take by mouth 2 (two) times daily.    [provider]  [facility] ULTRA test strip TEST TWICE A DAY. MAX PER INS 1/DAY 01/03/19   Joaquim Nam, MD  Semaglutide (RYBELSUS) 7 MG TABS 1/2 tab daily for 1 months then 1  tab daily thereafter.  Take in the AM. 12/07/19   Joaquim Nam, MD  sildenafil (REVATIO) 20 MG tablet Take 1-5 tablets (20-100 mg total) by mouth daily as needed. 12/30/18   Joaquim Nam, MD  traZODone (DESYREL) 50 MG tablet TAKE 1 TO 1.5 TABLETS BY MOUTH EVERY NIGHT FOR SLEEP 09/01/19   Joaquim Nam, MD    Family History Family History  Problem Relation Age of Onset  . Hypertension Mother   . Cancer Neg Hx   . COPD Neg Hx   . Diabetes Neg Hx   . Heart disease Neg Hx   . Hyperlipidemia Neg Hx   . Stroke Neg Hx   . Colon cancer Neg Hx   . Prostate cancer Neg Hx     Social History Social History   Tobacco  Use  . Smoking status: Never Smoker  . Smokeless tobacco: Never Used  Vaping Use  . Vaping Use: Never used  Substance Use Topics  . Alcohol use: Yes    Comment: Very rare- occasionaly beer or wine   . Drug use: No     Allergies   Amoxil [amoxicillin]   Review of Systems Review of Systems   Physical Exam Triage Vital Signs ED Triage Vitals  Enc Vitals Group     BP 12/29/19 1021 (!) 174/93     Pulse Rate 12/29/19 1021 62     Resp 12/29/19 1021 15     Temp 12/29/19 1021 97.9 F (36.6 C)     Temp Source 12/29/19 1021 Oral     SpO2 12/29/19 1021 96 %     Weight 12/29/19 1018 166 lb 3.6 oz (75.4 kg)     Height 12/29/19 1018 5' 9.5" (1.765 m)     Head Circumference --      Peak Flow --      Pain Score 12/29/19 1018 0     Pain Loc --      Pain Edu? --      Excl. in GC? --    No data found.  Updated Vital Signs BP (!) 174/93 (BP Location: Left Arm)   Pulse 62   Temp 97.9 F (36.6 C) (Oral)   Resp 15   Ht 5' 9.5" (1.765 m)   Wt 166 lb 3.6 oz (75.4 kg)   SpO2 96%   BMI 24.20 kg/m      Physical Exam Vitals and nursing note reviewed.  Constitutional:      Appearance: He is well-developed.  HENT:     Head: Normocephalic and atraumatic.  Eyes:     Conjunctiva/sclera: Conjunctivae normal.  Cardiovascular:     Rate and Rhythm: Normal rate and regular rhythm.     Heart sounds: Normal heart sounds. No murmur heard.   Pulmonary:     Effort: Pulmonary effort is normal. No respiratory distress.     Breath sounds: No stridor. No wheezing, rhonchi or rales.  Chest:     Chest wall: No tenderness.  Abdominal:     Palpations: Abdomen is soft.     Tenderness: There is no abdominal tenderness.  Musculoskeletal:     Cervical back: Neck supple.  Skin:    General: Skin is warm and dry.     Capillary Refill: Capillary refill takes less than 2 seconds.  Neurological:     General: No focal deficit present.     Mental Status: He is alert and oriented to person, place,  and time.  Psychiatric:  Mood and Affect: Mood normal.        Behavior: Behavior normal.        Thought Content: Thought content normal.      UC Treatments / Results  Labs (all labs ordered are listed, but only abnormal results are displayed) Labs Reviewed - No data to display  EKG   Radiology No results found.  Procedures Procedures (including critical care time)  Medications Ordered in UC Medications - No data to display  Initial Impression / Assessment and Plan / UC Course  I have reviewed the triage vital signs and the nursing notes.  Pertinent labs & imaging results that were available during my care of the patient were reviewed by me and considered in my medical decision making (see chart for details).     Chest Pain on Exertion  Discussed with patient that this pain could be cardiac in nature Discussed that labs could be done in the ER as well EKG machine is not working at this time, after multiple attempts to get EKG on this patient, he agrees to go to [facility] urgent care for EKG States that he does not want to go to the ER To [facility] in personal vehicle, stable at discharge. Final Clinical Impressions(s) / UC Diagnoses   Final diagnoses:  None   Discharge Instructions   None    ED Prescriptions    None     PDMP not reviewed this encounter.   Moshe Cipro, NP 01/01/20 1156

## 2020-01-01 NOTE — Discharge Instructions (Signed)
Go to [facility] for EKG

## 2020-01-01 NOTE — Telephone Encounter (Signed)
Thanks

## 2020-01-01 NOTE — Telephone Encounter (Signed)
He is already scheduled with [facility] tomorrow 10/26  with Dr Mariah Milling.

## 2020-01-02 ENCOUNTER — Other Ambulatory Visit: Payer: Self-pay

## 2020-01-02 ENCOUNTER — Encounter: Payer: Self-pay | Admitting: Cardiovascular Disease

## 2020-01-02 ENCOUNTER — Other Ambulatory Visit (INDEPENDENT_AMBULATORY_CARE_PROVIDER_SITE_OTHER): Payer: Medicare Other

## 2020-01-02 ENCOUNTER — Ambulatory Visit (INDEPENDENT_AMBULATORY_CARE_PROVIDER_SITE_OTHER): Payer: Medicare Other | Admitting: Family Medicine

## 2020-01-02 ENCOUNTER — Encounter: Payer: Self-pay | Admitting: Family Medicine

## 2020-01-02 ENCOUNTER — Ambulatory Visit (INDEPENDENT_AMBULATORY_CARE_PROVIDER_SITE_OTHER): Payer: Medicare Other | Admitting: Cardiovascular Disease

## 2020-01-02 VITALS — BP 164/80 | HR 58 | Ht 69.5 in | Wt 169.0 lb

## 2020-01-02 VITALS — BP 142/64 | HR 68 | Temp 96.8°F | Ht 69.5 in | Wt 168.5 lb

## 2020-01-02 DIAGNOSIS — R079 Chest pain, unspecified: Secondary | ICD-10-CM

## 2020-01-02 DIAGNOSIS — E119 Type 2 diabetes mellitus without complications: Secondary | ICD-10-CM

## 2020-01-02 DIAGNOSIS — I208 Other forms of angina pectoris: Secondary | ICD-10-CM

## 2020-01-02 DIAGNOSIS — I209 Angina pectoris, unspecified: Secondary | ICD-10-CM

## 2020-01-02 MED ORDER — ISOSORBIDE MONONITRATE ER 30 MG PO TB24: 30.0000 mg | ORAL_TABLET | Freq: Every day | ORAL | 3 refills | Status: DC

## 2020-01-02 NOTE — Progress Notes (Signed)
This visit occurred during the SARS-CoV-2 public health emergency.  Safety protocols were in place, including screening questions prior to the visit, additional usage of staff PPE, and extensive cleaning of exam room while observing appropriate contact time as indicated for disinfecting solutions.  Chest sx with exertion, better with rest.  Noted after having a cold.  UC and then ER notes d/w pt.  Last CP was last week.  None in the meantime.  Had cards eval, rx sent for imdur, to start today.  Imdur/ntg cautions d/w pt.  He has CTA pending.  Rationale for calcium scoring/cardiac CTA discussed with patient.  He wanted know about the pathophysiology behind bundle branch block.  Discussed with patient.  Meds, vitals, and allergies reviewed.   ROS: Per HPI unless specifically indicated in ROS section   GEN: nad, alert and oriented HEENT:ncat NECK: supple w/o LA CV: rrr.  PULM: ctab, no inc wob ABD: soft, +bs EXT: no edema SKIN: no acute rash  Diabetic foot exam: Normal inspection No skin breakdown No calluses  Normal DP pulses Normal sensation to light touch and monofilament Nails normal  At least 30 minutes were devoted to patient care in this encounter (this can potentially include time spent reviewing the patient's file/history, interviewing and examining the patient, counseling/reviewing plan with patient, ordering referrals, ordering tests, reviewing relevant laboratory or x-ray data, and documenting the encounter).

## 2020-01-02 NOTE — Progress Notes (Signed)
Cardiology Office Note  Date:  01/02/2020   ID:  Luis Wise, DOB 1940-12-17, MRN [id number]  PCP:  Luis Nam, MD   Chief Complaint  Patient presents with  . New Patient (Initial Visit)    Chest pain (non-radiating)-denies SOB; Meds verbally reviewed with patient.    HPI:  [name] is a 79 year old gentleman with past medical history of Diabetes type 2 Hypertension Hyperlipidemia Recent visit to the emergency room for chest pain Who presents for new patient evaluation for his chest pain  Relatively healthy, no prior cardiac disease Beginning oct 2021, developed upper respiratory infection Seem to get over this middle of October Had not fully recovered, possibly three quarters of the way there He started his exercise program 3x had SOB and chest tightness,  One time at rest when laying in bed other time when moving a hose some chest tightness  Licensed pilot,  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 BP elevated in office/ER Appears that average blood pressures 140-150 Sometimes spiking higher At home generally 140-150  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  EKG personally reviewed by myself on todays visit Normal sinus rhythm, left bundle branch block, new bundle branch block compared to prior EKG 2017  Details of his recent trip to the urgent care and emergency room December 29, 2019 experience chest pain with exercise, reported on and off for the past months 1 episode chest pain woke him from sleep List of by primary care to go to the emergency room for urgent care In urgent care [place] blood pressure 174/93 Did not want to go to the ER  Went to urgent care [place], blood pressure was still elevated Noted to have left bundle branch block troponin 47 Transported by EMS to ER Blood pressure in ER 191/92 Repeat troponin 44   PMH:   has a past medical history of Diabetes mellitus without complication (HCC), Hyperlipidemia, Hypertension,  and Insomnia.  PSH:    Past Surgical History:  Procedure Laterality Date  . LAMINECTOMY  1993   L5-S1  . TONSILLECTOMY      Current Outpatient Medications  Medication Sig Dispense Refill  . amLODipine (NORVASC) 10 MG tablet TAKE 1 TABLET BY MOUTH EVERY DAY 90 tablet 4  . Coenzyme Q10 (COQ10 PO) Take by mouth daily.    . fenofibrate 160 MG tablet TAKE 1 TABLET BY MOUTH EVERY DAY 90 tablet 4  . glimepiride (AMARYL) 4 MG tablet TAKE 1 TABLET BY MOUTH EVERY DAY WITH BREAKFAST 90 tablet 1  . lisinopril (ZESTRIL) 20 MG tablet TAKE 1 TABLET BY MOUTH EVERY DAY 90 tablet 1  . lisinopril-hydrochlorothiazide (ZESTORETIC) 20-25 MG tablet TAKE 1 TABLET BY MOUTH EVERY DAY 90 tablet 4  . metFORMIN (GLUCOPHAGE) 1000 MG tablet TAKE 1 TABLET BY MOUTH TWICE A DAY 180 tablet 3  . metoprolol tartrate (LOPRESSOR) 50 MG tablet TAKE 1 TABLET BY MOUTH TWICE A DAY 180 tablet 3  . Misc Natural Products (URINOZINC PO) Take by mouth in the morning and at bedtime.    . Multiple Vitamin (MULTIVITAMIN) tablet Take 1 tablet by mouth daily.    . Multiple Vitamins-Minerals (PRESERVISION AREDS 2 PO) Take by mouth 2 (two) times daily.    Letta Pate ULTRA test strip TEST TWICE A DAY. MAX PER INS 1/DAY 100 strip 12  . pravastatin (PRAVACHOL) 20 MG tablet TAKE 1 TABLET BY MOUTH EVERY DAY 90 tablet 4  . Semaglutide (RYBELSUS) 7 MG TABS 1/2 tab daily for 1 months then  1 tab daily thereafter.  Take in the AM. 90 tablet 3  . traZODone (DESYREL) 50 MG tablet TAKE 1 TO 1.5 TABLETS BY MOUTH EVERY NIGHT FOR SLEEP 135 tablet 3  . VITAMIN D PO Take by mouth daily.    . Cholecalciferol (VITAMIN D3) 1.25 MG (50000 UT) CAPS Take by mouth daily.    . isosorbide mononitrate (IMDUR) 30 MG 24 hr tablet Take 1 tablet (30 mg total) by mouth daily. 90 tablet 3   No current facility-administered medications for this visit.     Allergies:   Amoxil [amoxicillin]   Social History:  The patient  reports that he has never smoked. He has never  used smokeless tobacco. He reports current alcohol use. He reports that he does not use drugs.   Family History:   family history includes Hypertension in his mother.    Review of Systems: Review of Systems  Constitutional: Negative.   HENT: Negative.   Respiratory: Negative.   Cardiovascular: Positive for chest pain.  Gastrointestinal: Negative.   Musculoskeletal: Negative.   Neurological: Negative.   Psychiatric/Behavioral: Negative.   All other systems reviewed and are negative.   PHYSICAL EXAM: VS:  BP (!) 164/80 (BP Location: Right Arm, Patient Position: Sitting, Cuff Size: Normal)   Pulse (!) 58   Ht 5' 9.5" (1.765 m)   Wt 169 lb (76.7 kg)   SpO2 97%   BMI 24.60 kg/m  , BMI Body mass index is 24.6 kg/m. GEN: Well nourished, well developed, in no acute distress HEENT: normal Neck: no JVD, carotid bruits, or masses Cardiac: RRR; no murmurs, rubs, or gallops,no edema  Respiratory:  clear to auscultation bilaterally, normal work of breathing GI: soft, nontender, nondistended, + BS MS: no deformity or atrophy Skin: warm and dry, no rash Neuro:  Strength and sensation are intact Psych: euthymic mood, full affect   Recent Labs: 12/29/2019: ALT 20; BUN 23; Creatinine, Ser 1.13; Hemoglobin 14.4; Platelets 305; Potassium 3.7; Sodium 137    Lipid Panel Lab Results  Component Value Date   CHOL 151 06/24/2018   HDL 31 (L) 06/09/2017   LDLCALC 77 06/09/2017   TRIG 161 (H) 06/24/2018      Wt Readings from Last 3 Encounters:  01/02/20 169 lb (76.7 kg)  12/29/19 165 lb (74.8 kg)  12/29/19 166 lb 3.6 oz (75.4 kg)     ASSESSMENT AND PLAN:  Problem List Items Addressed This Visit    None    Visit Diagnoses    Chest pain of uncertain etiology    -  Primary   Relevant Orders   EKG 12-Lead   Chest pain, unspecified type       Relevant Orders   CT CORONARY MORPH W/CTA COR W/SCORE W/CA W/CM &/OR WO/CM   CT CORONARY FRACTIONAL FLOW RESERVE DATA PREP   CT CORONARY  FRACTIONAL FLOW RESERVE FLUID ANALYSIS     Chest pain Some typical and atypical features Risk factors including diabetes, hyperlipidemia New left bundle branch block on EKG is also a concern Discussed various options for working up including types of stress testing stress echo, Myoview, calcium scoring, cardiac CTA, even cardiac catheterization After long discussion he prefers cardiac CTA, I would agree -We have recommended he start Imdur 30 mg daily.  This will also help blood pressure -CTA cardiac study ordered -If symptoms of angina get worse, recommended he call our office immediately, Morbidly testing might be needed such as catheterization  Hyperlipidemia Numbers relatively well controlled Imaging study  above will help further determine if more aggressive management is needed  Essential hypertension Blood pressure mildly elevated at home 1 40-1 50, even higher in urgent care and ER Recommend he add isosorbide mononitrate 30 mg daily Recommend him closely monitor numbers at home and call us with measurements   Total encounter time more than 60 minutes  Greater than 50% was spent in counseling and coordination of care with the patient    Signed, Dossie Arbour, M.D., Ph.D. [facility] [place], [place] [phone]

## 2020-01-02 NOTE — Patient Instructions (Addendum)
Cardiac CTA for angina   Medication Instructions:  Your physician has recommended you make the following change in your medication:  1. STOP Sildenafil 2. START Isosorbide mononitrate 30 mg once daily  If you need a refill on your cardiac medications before your next appointment, please call your pharmacy.    Lab work: No new labs needed   If you have labs (blood work) drawn today and your tests are completely normal, you will receive your results only by: Marland Kitchen MyChart Message (if you have MyChart) OR . A paper copy in the mail If you have any lab test that is abnormal or we need to change your treatment, we will call you to review the results.   Testing/Procedures: Your cardiac CT will be scheduled at the location below:   [facility] [place], [place] [address] [phone]  If scheduled at [facility], please arrive 15 mins early for check-in and test prep.  Please follow these instructions carefully (unless otherwise directed):  Hold all erectile dysfunction medications at least 3 days (72 hrs) prior to test.  On the Night Before the Test: . Be sure to Drink plenty of water. . Do not consume any caffeinated/decaffeinated beverages or chocolate 12 hours prior to your test. . Do not take any antihistamines 12 hours prior to your test.   On the Day of the Test: . Drink plenty of water. Do not drink any water within one hour of the test. . Do not eat any food 4 hours prior to the test. . You may take your regular medications prior to the test.  . Take 2 tablets of metoprolol 50 mg (Lopressor) 100 mg two hours prior to test. . HOLD Furosemide/Hydrochlorothiazide morning of the test. . FEMALES- please wear underwire-free bra if available       After the Test: . Drink plenty of water. . After receiving IV contrast, you may experience a mild flushed feeling. This is normal. . On  occasion, you may experience a mild rash up to 24 hours after the test. This is not dangerous. If this occurs, you can take Benadryl 25 mg and increase your fluid intake. . If you experience trouble breathing, this can be serious. If it is severe call 911 IMMEDIATELY. If it is mild, please call our office. . If you take any of these medications: Glipizide/Metformin, Avandament, Glucavance, please do not take 48 hours after completing test unless otherwise instructed.   Once we have confirmed authorization from your insurance company, we will call you to set up a date and time for your test. Based on how quickly your insurance processes prior authorizations requests, please allow up to 4 weeks to be contacted for scheduling your Cardiac CT appointment. Be advised that routine Cardiac CT appointments could be scheduled as many as 8 weeks after your provider has ordered it.  For non-scheduling related questions, please contact the cardiac imaging nurse navigator should you have any questions/concerns: Marchia Bond, Cardiac Imaging Nurse Navigator Burley Saver, Interim Cardiac Imaging Nurse [facility] Direct Office Dial: [phone]   For scheduling needs, including cancellations and rescheduling, please call Vivien Rota at [phone], option 3.    Follow-Up: At [facility], you and your health needs are our priority.  As part of our continuing mission to provide you with exceptional heart care, we have created designated Provider Care Teams.  These Care Teams include your primary Cardiologist (physician) and  Advanced Practice Providers (APPs -  Physician Assistants and Nurse Practitioners) who all work together to provide you with the care you need, when you need it.  . You will need a follow up appointment : after cardiac CTA  . Providers on your designated Care Team:   . Murray Hodgkins, NP . Christell Faith, PA-C  . Marrianne Mood, PA-C  Any Other Special  Instructions Will Be Listed Below (If Applicable).  COVID-19 Vaccine Information can be found at: ShippingScam.co.uk For questions related to vaccine distribution or appointments, please email vaccine@ .com or call [phone].

## 2020-01-02 NOTE — Patient Instructions (Addendum)
Go to the lab on the way out.   If you have mychart we'll likely use that to update you.    Limit exertion for now.   If you have chest pain call cardiology.  Take care.  Glad to see you. I'll await the notes from the CT per cardiology.

## 2020-01-03 LAB — CBC WITH DIFFERENTIAL/PLATELET
Basophils Absolute: 0 10*3/uL (ref 0.0–0.1)
Basophils Relative: 0.7 % (ref 0.0–3.0)
Eosinophils Absolute: 0.2 10*3/uL (ref 0.0–0.7)
Eosinophils Relative: 2.9 % (ref 0.0–5.0)
HCT: 40.6 % (ref 39.0–52.0)
Hemoglobin: 13.7 g/dL (ref 13.0–17.0)
Lymphocytes Relative: 18.7 % (ref 12.0–46.0)
Lymphs Abs: 1 10*3/uL (ref 0.7–4.0)
MCHC: 33.8 g/dL (ref 30.0–36.0)
MCV: 84.4 fl (ref 78.0–100.0)
Monocytes Absolute: 0.8 10*3/uL (ref 0.1–1.0)
Monocytes Relative: 14.8 % — ABNORMAL HIGH (ref 3.0–12.0)
Neutro Abs: 3.2 10*3/uL (ref 1.4–7.7)
Neutrophils Relative %: 62.9 % (ref 43.0–77.0)
Platelets: 254 10*3/uL (ref 150.0–400.0)
RBC: 4.81 Mil/uL (ref 4.22–5.81)
RDW: 14.5 % (ref 11.5–15.5)
WBC: 5.2 10*3/uL (ref 4.0–10.5)

## 2020-01-03 LAB — COMPREHENSIVE METABOLIC PANEL
ALT: 15 U/L (ref 0–53)
AST: 16 U/L (ref 0–37)
Albumin: 4.8 g/dL (ref 3.5–5.2)
Alkaline Phosphatase: 29 U/L — ABNORMAL LOW (ref 39–117)
BUN: 21 mg/dL (ref 6–23)
CO2: 28 mEq/L (ref 19–32)
Calcium: 10.5 mg/dL (ref 8.4–10.5)
Chloride: 99 mEq/L (ref 96–112)
Creatinine, Ser: 1.22 mg/dL (ref 0.40–1.50)
GFR: 57.44 mL/min — ABNORMAL LOW (ref >60.00)
Glucose, Bld: 138 mg/dL — ABNORMAL HIGH (ref 70–99)
Potassium: 3.5 mEq/L (ref 3.5–5.1)
Sodium: 137 mEq/L (ref 135–145)
Total Bilirubin: 0.4 mg/dL (ref 0.2–1.2)
Total Protein: 6.7 g/dL (ref 6.0–8.3)

## 2020-01-03 LAB — LIPID PANEL
Cholesterol: 139 mg/dL (ref 0–200)
HDL: 25.1 mg/dL — ABNORMAL LOW (ref >39.00)
NonHDL: 113.61
Total CHOL/HDL Ratio: 6
Triglycerides: 245 mg/dL — ABNORMAL HIGH (ref 0.0–149.0)
VLDL: 49 mg/dL — ABNORMAL HIGH (ref 0.0–40.0)

## 2020-01-03 LAB — LDL CHOLESTEROL, DIRECT: Direct LDL: 81 mg/dL

## 2020-01-03 LAB — HEMOGLOBIN A1C: Hgb A1c MFr Bld: 7 % — ABNORMAL HIGH (ref 4.6–6.5)

## 2020-01-03 NOTE — Assessment & Plan Note (Signed)
See notes on follow-up A1c.

## 2020-01-03 NOTE — Assessment & Plan Note (Signed)
Routine cautions given to patient.  No chest pain now.  Limit exertion.  He has a calcium scoring/cardiac CT pending.  Rationale discussed with patient.  Recheck lipids and A1c today.  Continue baseline medications as is for now, including blood pressure medicines and statin with Imdur recently added by cardiology.  bundle branch block pathophysiology discussed with patient.  All questions answered.  He will update me as needed.  I will await cardiology follow-up studies.

## 2020-01-04 ENCOUNTER — Other Ambulatory Visit: Payer: Medicare Other

## 2020-01-04 NOTE — Telephone Encounter (Signed)
Can we send him his old EKG, I saw it in the pile.  I talked with Dr. Myriam Forehand, I would wait for the CT  should/ not take too long in [place]. I think if not much angina day to day, I would stick with the plan of cardiac cta. It does give lots of other information the cath does not provide Better look at blockages,  better visualization of cardiac function, wall thickness, partial blockages, lungs GI tract, valves, etc. Much less risk and cost if no significant blockage. Final decision up to the patient

## 2020-01-05 ENCOUNTER — Encounter: Payer: Self-pay | Admitting: Family Medicine

## 2020-01-07 ENCOUNTER — Other Ambulatory Visit: Payer: Self-pay | Admitting: Family Medicine

## 2020-01-09 ENCOUNTER — Telehealth (HOSPITAL_COMMUNITY): Payer: Self-pay | Admitting: Emergency Medicine

## 2020-01-09 NOTE — Telephone Encounter (Signed)
Reaching out to patient to offer assistance regarding upcoming cardiac imaging study; pt verbalizes understanding of appt date/time, parking situation and where to check in, pre-test NPO status and medications ordered, and verified current allergies; name and call back number provided for further questions should they arise Vaniah Chambers RN Navigator Cardiac Imaging [facility] [phone] office [phone] cell 

## 2020-01-10 ENCOUNTER — Telehealth (HOSPITAL_COMMUNITY): Payer: Self-pay | Admitting: Emergency Medicine

## 2020-01-10 NOTE — Telephone Encounter (Signed)
Pt left vm after hours stating some confusion on instructions and seeking clarification.   Pt not available so left a vm on his machine for call back  Rockwell Alexandria RN Navigator Cardiac Imaging [facility] [phone] Office  [phone] Cell

## 2020-01-11 ENCOUNTER — Other Ambulatory Visit: Payer: Self-pay

## 2020-01-11 ENCOUNTER — Ambulatory Visit (INDEPENDENT_AMBULATORY_CARE_PROVIDER_SITE_OTHER): Payer: Medicare Other | Admitting: Family Medicine

## 2020-01-11 ENCOUNTER — Encounter: Payer: Self-pay | Admitting: Family Medicine

## 2020-01-11 ENCOUNTER — Ambulatory Visit
Admission: RE | Admit: 2020-01-11 | Discharge: 2020-01-11 | Disposition: A | Discharge status: Home / Self Care | Payer: Medicare Other | Source: Ambulatory Visit | Attending: Cardiovascular Disease | Admitting: Cardiovascular Disease

## 2020-01-11 VITALS — BP 150/70 | HR 95 | Temp 97.0°F | Ht 69.5 in | Wt 167.7 lb

## 2020-01-11 DIAGNOSIS — E785 Hyperlipidemia, unspecified: Secondary | ICD-10-CM | POA: Diagnosis not present

## 2020-01-11 DIAGNOSIS — I208 Other forms of angina pectoris: Secondary | ICD-10-CM

## 2020-01-11 DIAGNOSIS — G4700 Insomnia, unspecified: Secondary | ICD-10-CM

## 2020-01-11 DIAGNOSIS — I10 Essential (primary) hypertension: Secondary | ICD-10-CM | POA: Diagnosis not present

## 2020-01-11 DIAGNOSIS — E1159 Type 2 diabetes mellitus with other circulatory complications: Secondary | ICD-10-CM | POA: Diagnosis not present

## 2020-01-11 DIAGNOSIS — E782 Mixed hyperlipidemia: Secondary | ICD-10-CM | POA: Diagnosis not present

## 2020-01-11 DIAGNOSIS — R079 Chest pain, unspecified: Secondary | ICD-10-CM | POA: Diagnosis not present

## 2020-01-11 DIAGNOSIS — I2510 Atherosclerotic heart disease of native coronary artery without angina pectoris: Secondary | ICD-10-CM | POA: Diagnosis not present

## 2020-01-11 DIAGNOSIS — I447 Left bundle-branch block, unspecified: Secondary | ICD-10-CM | POA: Diagnosis not present

## 2020-01-11 DIAGNOSIS — E119 Type 2 diabetes mellitus without complications: Secondary | ICD-10-CM

## 2020-01-11 DIAGNOSIS — Z0000 Encounter for general adult medical examination without abnormal findings: Secondary | ICD-10-CM

## 2020-01-11 DIAGNOSIS — N400 Enlarged prostate without lower urinary tract symptoms: Secondary | ICD-10-CM

## 2020-01-11 DIAGNOSIS — Z1159 Encounter for screening for other viral diseases: Secondary | ICD-10-CM

## 2020-01-11 DIAGNOSIS — I152 Hypertension secondary to endocrine disorders: Secondary | ICD-10-CM

## 2020-01-11 DIAGNOSIS — Z7189 Other specified counseling: Secondary | ICD-10-CM

## 2020-01-11 MED ORDER — IOHEXOL 350 MG/ML SOLN
75.0000 mL | Freq: Once | INTRAVENOUS | Status: AC | PRN
  Administered 2020-01-11: 75 mL via INTRAVENOUS

## 2020-01-11 MED ORDER — NITROGLYCERIN 0.4 MG SL SUBL
0.8000 mg | SUBLINGUAL_TABLET | Freq: Once | SUBLINGUAL | Status: AC
  Administered 2020-01-11: 0.8 mg via SUBLINGUAL

## 2020-01-11 MED ORDER — METFORMIN HCL 1000 MG PO TABS
ORAL_TABLET | ORAL | Status: DC
Start: 2020-01-11 — End: 2020-05-10

## 2020-01-11 NOTE — Progress Notes (Signed)
Patient tolerated CT well. Drinking water sitting in chair. Vital signs stable encourage to drink water throughout day.Reasons explained and verbalized understanding. Ambulated steady gait.   

## 2020-01-11 NOTE — Progress Notes (Signed)
This visit occurred during the SARS-CoV-2 public health emergency.  Safety protocols were in place, including screening questions prior to the visit, additional usage of staff PPE, and extensive cleaning of exam room while observing appropriate contact time as indicated for disinfecting solutions.  Tetanus 2016 Flu shot 2021 covid vaccine.  PNA up to date.  Shingles 2020.  Advance directive discussed with patient.  Wife designated if patient were incapacitated. Colonoscopy prev done per patient report.  Not in the last 10 years.  D/w pt about options.  Would defer with cardiac w/u ongoing.  No blood in stool.   Prostate cancer screening and PSA options (with potential risks and benefits of testing vs not testing) were discussed along with recent recs/guidelines.  He declined testing PSA at this point.  He had h/o HAV with overseas travel, this was in this distant past. Resolved without sx o/w.  Pt opts in for HCV screening.  D/w pt re: routine screening.    He has some intermittent stream at night.  Not noted in the daytime.  No burning with urination.  No FCNAVD.  Nocturia x2 at night.  Nonbothersome.    Diabetes:  Using medications without difficulties: yes Hypoglycemic episodes: no Hyperglycemic episodes:no Feet problems: dec but not absent sensation in the B feet.  Blood Sugars averaging: not checked often recently.   eye exam within last year: yes Loose stools noted.  Unclear if related to metformin.  He has BM after breakfast and lunch.    Hypertension:    Using medication without problems or lightheadedness: yes Chest pain with exertion: prev d/w pt.  No CP with lack of exertion.   Edema:no Short of breath:no He noted some HA with imdur but those are mild.  Tylenol helps.    Elevated Cholesterol: Using medications without problems: yes Muscle aches: no Diet compliance: yes Exercise: limited recently.   Insomnia better with 75mg  trazodone.  No ADE on med.  Some relief with  med.  D/w pt.   PMH and SH reviewed.  Vital signs, Meds and allergies reviewed.  ROS: Per HPI unless specifically indicated in ROS section   GEN: nad, alert and oriented HEENT: ncat NECK: supple w/o LA CV: rrr.  no murmur PULM: ctab, no inc wob ABD: soft, +bs EXT: no edema SKIN: no acute rash  Diabetic foot exam: Normal inspection No skin breakdown No calluses  Normal DP pulses Normal sensation to light touch and monofilament except for dec sensation to monofilament on the distal toes.  Sensation still intact to light touch distally.   Nails normal

## 2020-01-11 NOTE — Patient Instructions (Addendum)
See if cutting the metformin back to 500mg  in the AM helps.  You may be able to continue metformin 1000mg  at night.   Let me know if that doesn't help.  I'll await the cardiology notes.    Plan on recheck in about 3-4 months.  We can do labs at the visit.  Take care.  Glad to see you.

## 2020-01-12 DIAGNOSIS — I2510 Atherosclerotic heart disease of native coronary artery without angina pectoris: Secondary | ICD-10-CM | POA: Diagnosis not present

## 2020-01-14 DIAGNOSIS — Z0000 Encounter for general adult medical examination without abnormal findings: Secondary | ICD-10-CM | POA: Insufficient documentation

## 2020-01-14 NOTE — Assessment & Plan Note (Signed)
Loose stools noted.  Unclear if related to metformin.  He has BM after breakfast and lunch.  He will cut back Metformin to 500 mg in the morning with 1000 mg at night.  He will see if that helps.  Plan on recheck in 3 months but will await cardiology notes in the meantime.

## 2020-01-14 NOTE — Assessment & Plan Note (Signed)
Continue fenofibrate and pravastatin for now.  We may need to change his statin based on his cardiac work-up.  Discussed with patient.  Labs discussed with patient.  No change in medications for now.

## 2020-01-14 NOTE — Assessment & Plan Note (Signed)
Imdur was added on.  He noted some mild headache with this but those are not severe and are tolerable.  Would continue for now.  Awaiting cardiac work-up.  Continue medications as is.  Current Outpatient Medications on File Prior to Visit  Medication Sig Dispense Refill  . amLODipine (NORVASC) 10 MG tablet TAKE 1 TABLET BY MOUTH EVERY DAY 90 tablet 4  . cholecalciferol (VITAMIN D3) 25 MCG (1000 UNIT) tablet Take 1,000 Units by mouth daily.    . Coenzyme Q10 (COQ10 PO) Take by mouth daily.    . fenofibrate 160 MG tablet TAKE 1 TABLET BY MOUTH EVERY DAY 90 tablet 4  . glimepiride (AMARYL) 4 MG tablet TAKE 1 TABLET BY MOUTH EVERY DAY WITH BREAKFAST 90 tablet 1  . isosorbide mononitrate (IMDUR) 30 MG 24 hr tablet Take 1 tablet (30 mg total) by mouth daily. 90 tablet 3  . lisinopril (ZESTRIL) 20 MG tablet TAKE 1 TABLET BY MOUTH EVERY DAY 90 tablet 1  . lisinopril-hydrochlorothiazide (ZESTORETIC) 20-25 MG tablet TAKE 1 TABLET BY MOUTH EVERY DAY 90 tablet 4  . metoprolol tartrate (LOPRESSOR) 50 MG tablet TAKE 1 TABLET BY MOUTH TWICE A DAY 180 tablet 3  . Misc Natural Products (URINOZINC PO) Take by mouth in the morning and at bedtime.    . Multiple Vitamins-Minerals (PRESERVISION AREDS 2 PO) Take by mouth 2 (two) times daily.    Letta Pate ULTRA test strip TEST TWICE A DAY. MAX PER INS 1/DAY 100 strip 12  . pravastatin (PRAVACHOL) 20 MG tablet TAKE 1 TABLET BY MOUTH EVERY DAY 90 tablet 2  . Semaglutide (RYBELSUS) 7 MG TABS 1/2 tab daily for 1 months then 1 tab daily thereafter.  Take in the AM. 90 tablet 3  . traZODone (DESYREL) 50 MG tablet TAKE 1 TO 1.5 TABLETS BY MOUTH EVERY NIGHT FOR SLEEP 135 tablet 3   No current facility-administered medications on file prior to visit.

## 2020-01-14 NOTE — Assessment & Plan Note (Signed)
He has some intermittent stream at night.  Not noted in the daytime.  No burning with urination.  No FCNAVD.  Nocturia x2 at night.  Nonbothersome.   He can update me as needed.  It makes sense to get cardiac work-up completed first.

## 2020-01-14 NOTE — Assessment & Plan Note (Signed)
Advance directive discussed with patient.  Wife designated if patient were incapacitated. 

## 2020-01-14 NOTE — Assessment & Plan Note (Addendum)
Tetanus 2016 Flu shot 2021 covid vaccine.  PNA up to date.  Shingles 2020.  Advance directive discussed with patient.  Wife designated if patient were incapacitated. Colonoscopy prev done per patient report.  Not in the last 10 years.  D/w pt about options.  Would defer with cardiac w/u ongoing.  No blood in stool.   Prostate cancer screening and PSA options (with potential risks and benefits of testing vs not testing) were discussed along with recent recs/guidelines.  He declined testing PSA at this point.  He had h/o HAV with overseas travel, this was in this distant past. Resolved without sx o/w.  Pt opts in for HCV screening.  D/w pt re: routine screening.

## 2020-01-14 NOTE — Assessment & Plan Note (Signed)
Insomnia better with 75mg  trazodone.  No ADE on med.  Some relief with med.  D/w pt. continue as is.  He agrees.

## 2020-01-15 ENCOUNTER — Other Ambulatory Visit: Payer: Self-pay

## 2020-01-15 ENCOUNTER — Encounter: Payer: Self-pay | Admitting: *Deleted

## 2020-01-15 ENCOUNTER — Encounter: Payer: Self-pay | Admitting: Cardiovascular Disease

## 2020-01-15 ENCOUNTER — Ambulatory Visit (INDEPENDENT_AMBULATORY_CARE_PROVIDER_SITE_OTHER): Payer: Medicare Other | Admitting: Cardiovascular Disease

## 2020-01-15 ENCOUNTER — Telehealth: Payer: Self-pay | Admitting: Cardiovascular Disease

## 2020-01-15 VITALS — BP 158/80 | HR 59 | Ht 69.5 in | Wt 168.0 lb

## 2020-01-15 DIAGNOSIS — I208 Other forms of angina pectoris: Secondary | ICD-10-CM | POA: Diagnosis not present

## 2020-01-15 DIAGNOSIS — I25118 Atherosclerotic heart disease of native coronary artery with other forms of angina pectoris: Secondary | ICD-10-CM | POA: Diagnosis not present

## 2020-01-15 DIAGNOSIS — E119 Type 2 diabetes mellitus without complications: Secondary | ICD-10-CM

## 2020-01-15 DIAGNOSIS — E782 Mixed hyperlipidemia: Secondary | ICD-10-CM

## 2020-01-15 DIAGNOSIS — I152 Hypertension secondary to endocrine disorders: Secondary | ICD-10-CM

## 2020-01-15 DIAGNOSIS — E1159 Type 2 diabetes mellitus with other circulatory complications: Secondary | ICD-10-CM | POA: Diagnosis not present

## 2020-01-15 MED ORDER — CLOPIDOGREL BISULFATE 75 MG PO TABS: 75.0000 mg | ORAL_TABLET | Freq: Every day | ORAL | 3 refills | Status: DC

## 2020-01-15 MED ORDER — CLOPIDOGREL BISULFATE 300 MG PO TABS: 300.0000 mg | ORAL_TABLET | Freq: Once | ORAL | 0 refills | Status: AC

## 2020-01-15 NOTE — Telephone Encounter (Signed)
Spoke with patient and reviewed testing and options to come in and review with provider or we could schedule procedure. He is nearby and wants to come in for review with Dr. Mariah Milling. Scheduled and made him aware to please come now so he can make it on time. He verbalized understanding with no further questions at that time. Added him on schedule and he is now on his way.

## 2020-01-15 NOTE — Progress Notes (Signed)
Cardiology Office Note  Date:  01/15/2020   ID:  Luis Wise, DOB 04/18/1940, MRN [id number]  PCP:  Joaquim Nam, MD   Chief Complaint  Patient presents with  . Follow-up    Abnormal CTA  Pt states no new Sx.    HPI:  Luis Wise is a 79 year old gentleman with past medical history of Diabetes type 2 Hypertension Hyperlipidemia Recent visit to the emergency room for chest pain Who presents for f/u of his CAD and angina/chest pain  On last clinic visit he reported episodes of chest pain, shortness of breath sometimes with exertion sometimes at rest Moving a hose, had chest pain Also had one episode of chest pain while laying in bed At least 3 or more episodes that caught his attention Seen in the emergency room December 29, 2019 for chest pain Also on last visit, blood pressure high  Cardiac CTA with significant stenosis in the mid LAD Also with disease noted in the mid RCA, OM ostial region Report pulled up and reviewed with him today, all questions answered  Licensed pilot,  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  Last EKG with left bundle branch block  Other past medical history reviewed Details of his recent trip to the urgent care and emergency room December 29, 2019 experience chest pain with exercise, reported on and off for the past months 1 episode chest pain woke him from sleep List of by primary care to go to the emergency room for urgent care In urgent care [place] blood pressure 174/93 Did not want to go to the ER  Went to urgent care [place], blood pressure was still elevated Noted to have left bundle branch block troponin 47 Transported by EMS to ER Blood pressure in ER 191/92 Repeat troponin 44   PMH:   has a past medical history of Diabetes mellitus without complication (HCC), Hyperlipidemia, Hypertension, and Insomnia.  PSH:    Past Surgical History:  Procedure Laterality Date  . LAMINECTOMY  1993   L5-S1  . TONSILLECTOMY       Current Outpatient Medications  Medication Sig Dispense Refill  . amLODipine (NORVASC) 10 MG tablet TAKE 1 TABLET BY MOUTH EVERY DAY 90 tablet 4  . cholecalciferol (VITAMIN D3) 25 MCG (1000 UNIT) tablet Take 1,000 Units by mouth daily.    . Coenzyme Q10 (COQ10 PO) Take by mouth daily.    . fenofibrate 160 MG tablet TAKE 1 TABLET BY MOUTH EVERY DAY 90 tablet 4  . glimepiride (AMARYL) 4 MG tablet TAKE 1 TABLET BY MOUTH EVERY DAY WITH BREAKFAST 90 tablet 1  . isosorbide mononitrate (IMDUR) 30 MG 24 hr tablet Take 1 tablet (30 mg total) by mouth daily. 90 tablet 3  . lisinopril (ZESTRIL) 20 MG tablet TAKE 1 TABLET BY MOUTH EVERY DAY 90 tablet 1  . lisinopril-hydrochlorothiazide (ZESTORETIC) 20-25 MG tablet TAKE 1 TABLET BY MOUTH EVERY DAY 90 tablet 4  . metFORMIN (GLUCOPHAGE) 1000 MG tablet 500mg  in the AM and 1000mg  in the PM    . metoprolol tartrate (LOPRESSOR) 50 MG tablet TAKE 1 TABLET BY MOUTH TWICE A DAY 180 tablet 3  . Misc Natural Products (URINOZINC PO) Take by mouth in the morning and at bedtime.    . Multiple Vitamins-Minerals (PRESERVISION AREDS 2 PO) Take by mouth 2 (two) times daily.    ULTRA test strip TEST TWICE A DAY. MAX PER INS 1/DAY 100 strip 12  . pravastatin (PRAVACHOL) 20 MG tablet TAKE 1 TABLET  BY MOUTH EVERY DAY 90 tablet 2  . Semaglutide (RYBELSUS) 7 MG TABS 1/2 tab daily for 1 months then 1 tab daily thereafter.  Take in the AM. 90 tablet 3  . traZODone (DESYREL) 50 MG tablet TAKE 1 TO 1.5 TABLETS BY MOUTH EVERY NIGHT FOR SLEEP 135 tablet 3   No current facility-administered medications for this visit.     Allergies:   Amoxil [amoxicillin]   Social History:  The patient  reports that he has never smoked. He has never used smokeless tobacco. He reports current alcohol use. He reports that he does not use drugs.   Family History:   family history includes Hypertension in his mother.    Review of Systems: Review of Systems  Constitutional:  Negative.   HENT: Negative.   Respiratory: Negative.   Cardiovascular: Positive for chest pain.  Gastrointestinal: Negative.   Musculoskeletal: Negative.   Neurological: Negative.   Psychiatric/Behavioral: Negative.   All other systems reviewed and are negative.   PHYSICAL EXAM: VS:  BP (!) 158/80   Pulse (!) 59   Ht 5' 9.5" (1.765 m)   Wt 168 lb (76.2 kg)   BMI 24.45 kg/m  , BMI Body mass index is 24.45 kg/m. Constitutional:  oriented to person, place, and time. No distress.  HENT:  Head: Grossly normal Eyes:  no discharge. No scleral icterus.  Neck: No JVD, no carotid bruits  Cardiovascular: Regular rate and rhythm, no murmurs appreciated Pulmonary/Chest: Clear to auscultation bilaterally, no wheezes or rails Abdominal: Soft.  no distension.  no tenderness.  Musculoskeletal: Normal range of motion Neurological:  normal muscle tone. Coordination normal. No atrophy Skin: Skin warm and dry Psychiatric: normal affect, pleasant  Recent Labs: 01/02/2020: ALT 15; BUN 21; Creatinine, Ser 1.22; Hemoglobin 13.7; Platelets 254.0; Potassium 3.5; Sodium 137    Lipid Panel Lab Results  Component Value Date   CHOL 139 01/02/2020   HDL 25.10 (L) 01/02/2020   LDLCALC 77 06/09/2017   TRIG 245.0 (H) 01/02/2020    Wt Readings from Last 3 Encounters:  01/15/20 168 lb (76.2 kg)  01/11/20 167 lb 11.2 oz (76.1 kg)  01/02/20 168 lb 8 oz (76.4 kg)     ASSESSMENT AND PLAN:  Problem List Items Addressed This Visit    None     CAD with stable angina Given anginal symptoms, also with CT scan with calcium score greater than 5000, lesions of significance in the mid LAD, FFR positive  Non-FFR positive lesions in the mid RCA and ostial OM, we have scheduled him for cardiac catheterization this Friday with Dr. Kirke Corin Case discussed with Dr. Kirke Corin, -Load with Plavix 300 today, then 75 subsequent days -Stay on aspirin 81 daily I have reviewed the risks, indications, and alternatives to  cardiac catheterization, possible angioplasty, and stenting with the patient. Risks include but are not limited to bleeding, infection, vascular injury, stroke, myocardial infection, arrhythmia, kidney injury, radiation-related injury in the case of prolonged fluoroscopy use, emergency cardiac surgery, and death. The patient understands the risks of serious complication is 1-2 in 1000 with diagnostic cardiac cath and 1-2% or less with angioplasty/stenting.  -Covid screening ordered today  Hyperlipidemia He would likely benefit from changed from pravastatin to high intensity Lipitor We can discuss with him at time of his catheterization this week  Essential hypertension Recommend he closely monitor blood pressure at home Call us with some numbers For now will stay on current regimen including isosorbide   Total encounter time more than  35 minutes  Greater than 50% was spent in counseling and coordination of care with the patient    Signed, Dossie Arbour, M.D., Ph.D. [facility] [place], [place] [phone]

## 2020-01-15 NOTE — H&P (View-Only) (Signed)
Cardiology Office Note  Date:  01/15/2020   ID:  Luis Wise, DOB 04/18/1940, MRN [id number]  PCP:  Luis Nam, MD   Chief Complaint  Patient presents with  . Follow-up    Abnormal CTA  Pt states no new Sx.    HPI:  [name] is a 79 year old gentleman with past medical history of Diabetes type 2 Hypertension Hyperlipidemia Recent visit to the emergency room for chest pain Who presents for f/u of his CAD and angina/chest pain  On last clinic visit he reported episodes of chest pain, shortness of breath sometimes with exertion sometimes at rest Moving a hose, had chest pain Also had one episode of chest pain while laying in bed At least 3 or more episodes that caught his attention Seen in the emergency room December 29, 2019 for chest pain Also on last visit, blood pressure high  Cardiac CTA with significant stenosis in the mid LAD Also with disease noted in the mid RCA, OM ostial region Report pulled up and reviewed with him today, all questions answered  Licensed pilot,  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  Last EKG with left bundle branch block  Other past medical history reviewed Details of his recent trip to the urgent care and emergency room December 29, 2019 experience chest pain with exercise, reported on and off for the past months 1 episode chest pain woke him from sleep List of by primary care to go to the emergency room for urgent care In urgent care [place] blood pressure 174/93 Did not want to go to the ER  Went to urgent care [place], blood pressure was still elevated Noted to have left bundle branch block troponin 47 Transported by EMS to ER Blood pressure in ER 191/92 Repeat troponin 44   PMH:   has a past medical history of Diabetes mellitus without complication (HCC), Hyperlipidemia, Hypertension, and Insomnia.  PSH:    Past Surgical History:  Procedure Laterality Date  . LAMINECTOMY  1993   L5-S1  . TONSILLECTOMY       Current Outpatient Medications  Medication Sig Dispense Refill  . amLODipine (NORVASC) 10 MG tablet TAKE 1 TABLET BY MOUTH EVERY DAY 90 tablet 4  . cholecalciferol (VITAMIN D3) 25 MCG (1000 UNIT) tablet Take 1,000 Units by mouth daily.    . Coenzyme Q10 (COQ10 PO) Take by mouth daily.    . fenofibrate 160 MG tablet TAKE 1 TABLET BY MOUTH EVERY DAY 90 tablet 4  . glimepiride (AMARYL) 4 MG tablet TAKE 1 TABLET BY MOUTH EVERY DAY WITH BREAKFAST 90 tablet 1  . isosorbide mononitrate (IMDUR) 30 MG 24 hr tablet Take 1 tablet (30 mg total) by mouth daily. 90 tablet 3  . lisinopril (ZESTRIL) 20 MG tablet TAKE 1 TABLET BY MOUTH EVERY DAY 90 tablet 1  . lisinopril-hydrochlorothiazide (ZESTORETIC) 20-25 MG tablet TAKE 1 TABLET BY MOUTH EVERY DAY 90 tablet 4  . metFORMIN (GLUCOPHAGE) 1000 MG tablet 500mg  in the AM and 1000mg  in the PM    . metoprolol tartrate (LOPRESSOR) 50 MG tablet TAKE 1 TABLET BY MOUTH TWICE A DAY 180 tablet 3  . Misc Natural Products (URINOZINC PO) Take by mouth in the morning and at bedtime.    . Multiple Vitamins-Minerals (PRESERVISION AREDS 2 PO) Take by mouth 2 (two) times daily.    ULTRA test strip TEST TWICE A DAY. MAX PER INS 1/DAY 100 strip 12  . pravastatin (PRAVACHOL) 20 MG tablet TAKE 1 TABLET  BY MOUTH EVERY DAY 90 tablet 2  . Semaglutide (RYBELSUS) 7 MG TABS 1/2 tab daily for 1 months then 1 tab daily thereafter.  Take in the AM. 90 tablet 3  . traZODone (DESYREL) 50 MG tablet TAKE 1 TO 1.5 TABLETS BY MOUTH EVERY NIGHT FOR SLEEP 135 tablet 3   No current facility-administered medications for this visit.     Allergies:   Amoxil [amoxicillin]   Social History:  The patient  reports that he has never smoked. He has never used smokeless tobacco. He reports current alcohol use. He reports that he does not use drugs.   Family History:   family history includes Hypertension in his mother.    Review of Systems: Review of Systems  Constitutional:  Negative.   HENT: Negative.   Respiratory: Negative.   Cardiovascular: Positive for chest pain.  Gastrointestinal: Negative.   Musculoskeletal: Negative.   Neurological: Negative.   Psychiatric/Behavioral: Negative.   All other systems reviewed and are negative.   PHYSICAL EXAM: VS:  BP (!) 158/80   Pulse (!) 59   Ht 5' 9.5" (1.765 m)   Wt 168 lb (76.2 kg)   BMI 24.45 kg/m  , BMI Body mass index is 24.45 kg/m. Constitutional:  oriented to person, place, and time. No distress.  HENT:  Head: Grossly normal Eyes:  no discharge. No scleral icterus.  Neck: No JVD, no carotid bruits  Cardiovascular: Regular rate and rhythm, no murmurs appreciated Pulmonary/Chest: Clear to auscultation bilaterally, no wheezes or rails Abdominal: Soft.  no distension.  no tenderness.  Musculoskeletal: Normal range of motion Neurological:  normal muscle tone. Coordination normal. No atrophy Skin: Skin warm and dry Psychiatric: normal affect, pleasant  Recent Labs: 01/02/2020: ALT 15; BUN 21; Creatinine, Ser 1.22; Hemoglobin 13.7; Platelets 254.0; Potassium 3.5; Sodium 137    Lipid Panel Lab Results  Component Value Date   CHOL 139 01/02/2020   HDL 25.10 (L) 01/02/2020   LDLCALC 77 06/09/2017   TRIG 245.0 (H) 01/02/2020    Wt Readings from Last 3 Encounters:  01/15/20 168 lb (76.2 kg)  01/11/20 167 lb 11.2 oz (76.1 kg)  01/02/20 168 lb 8 oz (76.4 kg)     ASSESSMENT AND PLAN:  Problem List Items Addressed This Visit    None     CAD with stable angina Given anginal symptoms, also with CT scan with calcium score greater than 5000, lesions of significance in the mid LAD, FFR positive  Non-FFR positive lesions in the mid RCA and ostial OM, we have scheduled him for cardiac catheterization this Friday with Dr. Kirke Wise Case discussed with Dr. Kirke Wise, -Load with Plavix 300 today, then 75 subsequent days -Stay on aspirin 81 daily I have reviewed the risks, indications, and alternatives to  cardiac catheterization, possible angioplasty, and stenting with the patient. Risks include but are not limited to bleeding, infection, vascular injury, stroke, myocardial infection, arrhythmia, kidney injury, radiation-related injury in the case of prolonged fluoroscopy use, emergency cardiac surgery, and death. The patient understands the risks of serious complication is 1-2 in 1000 with diagnostic cardiac cath and 1-2% or less with angioplasty/stenting.  -Covid screening ordered today  Hyperlipidemia He would likely benefit from changed from pravastatin to high intensity Lipitor We can discuss with him at time of his catheterization this week  Essential hypertension Recommend he closely monitor blood pressure at home Call us with some numbers For now will stay on current regimen including isosorbide   Total encounter time more than  35 minutes  Greater than 50% was spent in counseling and coordination of care with the patient    Signed, Dossie Arbour, M.D., Ph.D. [facility] [place], [place] [phone]

## 2020-01-15 NOTE — Telephone Encounter (Signed)
-----   Message from Antonieta Iba, MD sent at 01/14/2020  3:11 PM EST ----- CT cardiac study There is concern for stenosis in the mid LAD (coronary artery coming down front part of the heart), also the RCA, and side branch off the left circumflex This warrants a cardiac cath, suspect a stent or more might be needed We can set up a cath with me this week or if he would like to look at images, we can arrange office visit (or virtual visit)

## 2020-01-15 NOTE — Telephone Encounter (Signed)
Patient would like a call to discuss scheduling his procedure.

## 2020-01-15 NOTE — Patient Instructions (Addendum)
Cardiac cath on Wednesday with Dr. Kirke Corin For angina, CT scan showing blockage mid LAD   Medication Instructions:  Your physician has recommended you make the following change in your medication:  1. START Clopidogrel 300 mg once today or tomorrow 2. START Clopidogrel 75 mg the day after once daily.     If you need a refill on your cardiac medications before your next appointment, please call your pharmacy.    Lab work: No new labs needed   If you have labs (blood work) drawn today and your tests are completely normal, you will receive your results only by:  MyChart Message (if you have MyChart) OR  A paper copy in the mail If you have any lab test that is abnormal or we need to change your treatment, we will call you to review the results.   Testing/Procedures: See letter provided on procedure.    Follow-Up: At [facility], you and your health needs are our priority.  As part of our continuing mission to provide you with exceptional heart care, we have created designated Provider Care Teams.  These Care Teams include your primary Cardiologist (physician) and Advanced Practice Providers (APPs -  Physician Assistants and Nurse Practitioners) who all work together to provide you with the care you need, when you need it.   You will need a follow up appointment in 1 month   Providers on your designated Care Team:    Nicolasa Ducking, NP  Eula Listen, PA-C  Marisue Ivan, PA-C  Any Other Special Instructions Will Be Listed Below (If Applicable).  COVID-19 Vaccine Information can be found at: [email or web address] For questions related to vaccine distribution or appointments, please email [email or web address] or call [phone].

## 2020-01-17 ENCOUNTER — Other Ambulatory Visit
Admission: RE | Admit: 2020-01-17 | Discharge: 2020-01-17 | Disposition: A | Discharge status: Home / Self Care | Payer: Medicare Other | Source: Ambulatory Visit | Attending: Cardiovascular Disease | Admitting: Cardiovascular Disease

## 2020-01-17 ENCOUNTER — Other Ambulatory Visit: Payer: Self-pay

## 2020-01-17 DIAGNOSIS — Z01818 Encounter for other preprocedural examination: Secondary | ICD-10-CM | POA: Insufficient documentation

## 2020-01-17 DIAGNOSIS — Z20822 Contact with and (suspected) exposure to covid-19: Secondary | ICD-10-CM | POA: Insufficient documentation

## 2020-01-18 ENCOUNTER — Telehealth: Payer: Self-pay | Admitting: *Deleted

## 2020-01-18 ENCOUNTER — Ambulatory Visit: Payer: Medicare Other | Admitting: Nurse Practitioner

## 2020-01-18 ENCOUNTER — Other Ambulatory Visit: Payer: Self-pay | Admitting: Cardiovascular Disease

## 2020-01-18 LAB — SARS CORONAVIRUS 2 (TAT 6-24 HRS): SARS Coronavirus 2: NEGATIVE

## 2020-01-18 NOTE — Telephone Encounter (Signed)
Pt contacted pre-catheterization scheduled at [facility] for: Friday January 19, 2020 7:30 AM Verified arrival time and place: [facility] Main Entrance [facility]) at:   No solid food after midnight prior to cath, clear liquids until 5 AM day of procedure.  Hold: Glimepiride-AM of procedure Rybelus-AM of procedure Metformin-day of procedure and 48 hours post procedure Lisinopril-HCT-AM of procedure Lisinopril-PM prior to procedure-GFR 57  Except hold medications AM meds can be  taken pre-cath with sips of water including: ASA 81 mg Plavix 75 mg  Confirmed patient has responsible adult to drive home post procedure and be with patient first 24 hours after arriving home: yes  You are allowed ONE visitor in the waiting room during the time you are at the hospital for your procedure. Both you and your visitor must wear a mask once you enter the hospital.       COVID-19 Pre-Screening Questions:   In the past 14 days have you had a new cough, new headache, new nasal congestion, fever (100.4 or greater) unexplained body aches, new sore throat, or sudden loss of taste or sense of smell? no  In the past 14 days have you been around anyone with known Covid 19? no   Reviewed procedure/mask/visitor instructions, COVID-19 questions with patient.

## 2020-01-19 ENCOUNTER — Encounter (HOSPITAL_COMMUNITY): Payer: Self-pay | Admitting: Cardiovascular Disease

## 2020-01-19 ENCOUNTER — Ambulatory Visit (HOSPITAL_COMMUNITY)
Admission: RE | Admit: 2020-01-19 | Discharge: 2020-01-19 | Disposition: A | Discharge status: Home / Self Care | Payer: Medicare Other | Attending: Cardiovascular Disease | Admitting: Cardiovascular Disease

## 2020-01-19 ENCOUNTER — Ambulatory Visit (HOSPITAL_COMMUNITY)
Admission: RE | Disposition: A | Discharge status: Home / Self Care | Payer: Self-pay | Source: Home / Self Care | Attending: Cardiovascular Disease

## 2020-01-19 ENCOUNTER — Telehealth: Payer: Self-pay | Admitting: Cardiovascular Disease

## 2020-01-19 DIAGNOSIS — Z79899 Other long term (current) drug therapy: Secondary | ICD-10-CM | POA: Insufficient documentation

## 2020-01-19 DIAGNOSIS — I25118 Atherosclerotic heart disease of native coronary artery with other forms of angina pectoris: Secondary | ICD-10-CM | POA: Insufficient documentation

## 2020-01-19 DIAGNOSIS — I25119 Atherosclerotic heart disease of native coronary artery with unspecified angina pectoris: Secondary | ICD-10-CM | POA: Diagnosis not present

## 2020-01-19 DIAGNOSIS — R931 Abnormal findings on diagnostic imaging of heart and coronary circulation: Secondary | ICD-10-CM | POA: Diagnosis present

## 2020-01-19 DIAGNOSIS — Z7984 Long term (current) use of oral hypoglycemic drugs: Secondary | ICD-10-CM | POA: Diagnosis not present

## 2020-01-19 DIAGNOSIS — I2582 Chronic total occlusion of coronary artery: Secondary | ICD-10-CM | POA: Diagnosis not present

## 2020-01-19 DIAGNOSIS — E119 Type 2 diabetes mellitus without complications: Secondary | ICD-10-CM | POA: Insufficient documentation

## 2020-01-19 DIAGNOSIS — E785 Hyperlipidemia, unspecified: Secondary | ICD-10-CM | POA: Insufficient documentation

## 2020-01-19 DIAGNOSIS — I10 Essential (primary) hypertension: Secondary | ICD-10-CM | POA: Diagnosis not present

## 2020-01-19 DIAGNOSIS — Z880 Allergy status to penicillin: Secondary | ICD-10-CM | POA: Diagnosis not present

## 2020-01-19 HISTORY — PX: LEFT HEART CATH AND CORONARY ANGIOGRAPHY: CATH118249

## 2020-01-19 LAB — GLUCOSE, CAPILLARY: Glucose-Capillary: 191 mg/dL — ABNORMAL HIGH (ref 70–99)

## 2020-01-19 SURGERY — LEFT HEART CATH AND CORONARY ANGIOGRAPHY
Anesthesia: LOCAL

## 2020-01-19 MED ORDER — VERAPAMIL HCL 2.5 MG/ML IV SOLN
INTRAVENOUS | Status: DC | PRN
  Administered 2020-01-19: 10 mL via INTRA_ARTERIAL

## 2020-01-19 MED ORDER — HEPARIN (PORCINE) IN NACL 1000-0.9 UT/500ML-% IV SOLN
INTRAVENOUS | Status: DC | PRN
  Administered 2020-01-19 (×2): 500 mL

## 2020-01-19 MED ORDER — FENTANYL CITRATE (PF) 100 MCG/2ML IJ SOLN
INTRAMUSCULAR | Status: DC | PRN
  Administered 2020-01-19: 50 ug via INTRAVENOUS

## 2020-01-19 MED ORDER — IOHEXOL 350 MG/ML SOLN
INTRAVENOUS | Status: DC | PRN
  Administered 2020-01-19: 80 mL

## 2020-01-19 MED ORDER — SODIUM CHLORIDE 0.9% FLUSH: 3.0000 mL | Freq: Two times a day (BID) | INTRAVENOUS | Status: DC

## 2020-01-19 MED ORDER — ONDANSETRON HCL 4 MG/2ML IJ SOLN: 4.0000 mg | Freq: Four times a day (QID) | INTRAMUSCULAR | Status: DC | PRN

## 2020-01-19 MED ORDER — MIDAZOLAM HCL 2 MG/2ML IJ SOLN
INTRAMUSCULAR | Status: DC | PRN
  Administered 2020-01-19: 1 mg via INTRAVENOUS

## 2020-01-19 MED ORDER — HEPARIN SODIUM (PORCINE) 1000 UNIT/ML IJ SOLN
INTRAMUSCULAR | Status: AC
  Filled 2020-01-19: qty 1

## 2020-01-19 MED ORDER — ASPIRIN 81 MG PO CHEW: 81.0000 mg | CHEWABLE_TABLET | ORAL | Status: DC

## 2020-01-19 MED ORDER — SODIUM CHLORIDE 0.9% FLUSH: 3.0000 mL | INTRAVENOUS | Status: DC | PRN

## 2020-01-19 MED ORDER — ACETAMINOPHEN 325 MG PO TABS: 650.0000 mg | ORAL_TABLET | ORAL | Status: DC | PRN

## 2020-01-19 MED ORDER — HEPARIN (PORCINE) IN NACL 1000-0.9 UT/500ML-% IV SOLN
INTRAVENOUS | Status: AC
  Filled 2020-01-19: qty 1000

## 2020-01-19 MED ORDER — SODIUM CHLORIDE 0.9 % WEIGHT BASED INFUSION
3.0000 mL/kg/h | INTRAVENOUS | Status: AC
  Administered 2020-01-19: 3 mL/kg/h via INTRAVENOUS

## 2020-01-19 MED ORDER — MIDAZOLAM HCL 2 MG/2ML IJ SOLN
INTRAMUSCULAR | Status: AC
  Filled 2020-01-19: qty 2

## 2020-01-19 MED ORDER — SODIUM CHLORIDE 0.9 % IV SOLN: 250.0000 mL | INTRAVENOUS | Status: DC | PRN

## 2020-01-19 MED ORDER — LIDOCAINE HCL (PF) 1 % IJ SOLN
INTRAMUSCULAR | Status: AC
  Filled 2020-01-19: qty 30

## 2020-01-19 MED ORDER — SODIUM CHLORIDE 0.9 % IV SOLN: INTRAVENOUS | Status: DC

## 2020-01-19 MED ORDER — CLOPIDOGREL BISULFATE 75 MG PO TABS: 75.0000 mg | ORAL_TABLET | ORAL | Status: DC

## 2020-01-19 MED ORDER — LIDOCAINE HCL (PF) 1 % IJ SOLN
INTRAMUSCULAR | Status: DC | PRN
  Administered 2020-01-19: 2 mL

## 2020-01-19 MED ORDER — FENTANYL CITRATE (PF) 100 MCG/2ML IJ SOLN
INTRAMUSCULAR | Status: AC
  Filled 2020-01-19: qty 2

## 2020-01-19 MED ORDER — ASPIRIN EC 81 MG PO TBEC: 81.0000 mg | DELAYED_RELEASE_TABLET | Freq: Every day | ORAL | 2 refills | Status: AC

## 2020-01-19 MED ORDER — SODIUM CHLORIDE 0.9 % WEIGHT BASED INFUSION: 1.0000 mL/kg/h | INTRAVENOUS | Status: DC

## 2020-01-19 MED ORDER — VERAPAMIL HCL 2.5 MG/ML IV SOLN
INTRAVENOUS | Status: AC
  Filled 2020-01-19: qty 2

## 2020-01-19 SURGICAL SUPPLY — 11 items
CATH INFINITI 5FR ANG PIGTAIL (CATHETERS) ×2 IMPLANT
CATH INFINITI 5FR JK (CATHETERS) ×2 IMPLANT
DEVICE RAD COMP TR BAND LRG (VASCULAR PRODUCTS) ×2 IMPLANT
GLIDESHEATH SLEND SS 6F .021 (SHEATH) ×2 IMPLANT
GUIDEWIRE INQWIRE 1.5J.035X260 (WIRE) ×1 IMPLANT
INQWIRE 1.5J .035X260CM (WIRE) ×2
KIT HEART LEFT (KITS) ×2 IMPLANT
PACK CARDIAC CATHETERIZATION (CUSTOM PROCEDURE TRAY) ×2 IMPLANT
SYR MEDRAD MARK 7 150ML (SYRINGE) ×2 IMPLANT
TRANSDUCER W/STOPCOCK (MISCELLANEOUS) ×2 IMPLANT
TUBING CIL FLEX 10 FLL-RA (TUBING) ×2 IMPLANT

## 2020-01-19 NOTE — Telephone Encounter (Signed)
I spoke with Mrs. Kester (ok per DPR).  She states the patient did have his cath today with Dr. Kirke Corin, and he had to stop due to severe blockages that will require CABG.  They were not told clearly what to do with the metformin post cath. I advised Metformin is typically held x 48 hours post procedure to allow the kidneys time to recover from the contrast dye.  I have advised it is ok for Dr. Orson Slick to hold metformin x 48 hours post procedure and he may drink some extra water to help hydrate the kidneys as well.   They will keep the appointment with Dr. Donata Clay on Tuesday 11/14 and the patient already has a follow up with Dr. Mariah Milling on 03/05/20. I advised Mrs. Mealey we will keep this scheduled for now and change as needed pending him having surgery.  Mrs. Guerrette voices understanding of the above recommendations and is agreeable. She was appreciative for the call back today.

## 2020-01-19 NOTE — Telephone Encounter (Signed)
Patient and wife confused by discharge instructions   Pt c/o medication issue:  1. Name of Medication:  metformin  2. How are you currently taking this medication (dosage and times per day)? Holding   3. Are you having a reaction (difficulty breathing--STAT)?  No   4. What is your medication issue?  [facility] to hold metformin for several days due to a cath .  Cath was cancelled per wife because of severity of blockage.  POC now for CABG per wife.    Please call to advise if metformin should still be held .

## 2020-01-19 NOTE — Interval H&P Note (Signed)
Cath Lab Visit (complete for each Cath Lab visit)  Clinical Evaluation Leading to the Procedure:   ACS: No.  Non-ACS:    Anginal Classification: CCS III  Anti-ischemic medical therapy: Maximal Therapy (2 or more classes of medications)  Non-Invasive Test Results: High-risk stress test findings: cardiac mortality >3%/year  Prior CABG: Previous CABG      History and Physical Interval Note:  01/19/2020 8:00 AM  Luis Wise  has presented today for surgery, with the diagnosis of abnormal coronary ct.  The various methods of treatment have been discussed with the patient and family. After consideration of risks, benefits and other options for treatment, the patient has consented to  Procedure(s): LEFT HEART CATH AND CORONARY ANGIOGRAPHY (N/A) as a surgical intervention.  The patient's history has been reviewed, patient examined, no change in status, stable for surgery.  I have reviewed the patient's chart and labs.  Questions were answered to the patient's satisfaction.     Lorine Bears

## 2020-01-19 NOTE — Discharge Instructions (Signed)
Stop taking clopidogrel (Plavix)   Radial Site Care  This sheet gives you information about how to care for yourself after your procedure. Your health care provider may also give you more specific instructions. If you have problems or questions, contact your health care provider. What can I expect after the procedure? After the procedure, it is common to have:  Bruising and tenderness at the catheter insertion area. Follow these instructions at home: Medicines  Take over-the-counter and prescription medicines only as told by your health care provider. Insertion site care  Follow instructions from your health care provider about how to take care of your insertion site. Make sure you: ? Wash your hands with soap and water before you change your bandage (dressing). If soap and water are not available, use hand sanitizer. ? Change your dressing as told by your health care provider. ? Leave stitches (sutures), skin glue, or adhesive strips in place. These skin closures may need to stay in place for 2 weeks or longer. If adhesive strip edges start to loosen and curl up, you may trim the loose edges. Do not remove adhesive strips completely unless your health care provider tells you to do that.  Check your insertion site every day for signs of infection. Check for: ? Redness, swelling, or pain. ? Fluid or blood. ? Pus or a bad smell. ? Warmth.  Do not take baths, swim, or use a hot tub until your health care provider approves.  You may shower 24-48 hours after the procedure, or as directed by your health care provider. ? Remove the dressing and gently wash the site with plain soap and water. ? Pat the area dry with a clean towel. ? Do not rub the site. That could cause bleeding.  Do not apply powder or lotion to the site. Activity   For 24 hours after the procedure, or as directed by your health care provider: ? Do not flex or bend the affected arm. ? Do not push or pull heavy  objects with the affected arm. ? Do not drive yourself home from the hospital or clinic. You may drive 24 hours after the procedure unless your health care provider tells you not to. ? Do not operate machinery or power tools.  Do not lift anything that is heavier than 10 lb (4.5 kg), or the limit that you are told, until your health care provider says that it is safe.  Ask your health care provider when it is okay to: ? Return to work or school. ? Resume usual physical activities or sports. ? Resume sexual activity. General instructions  If the catheter site starts to bleed, raise your arm and put firm pressure on the site. If the bleeding does not stop, get help right away. This is a medical emergency.  If you went home on the same day as your procedure, a responsible adult should be with you for the first 24 hours after you arrive home.  Keep all follow-up visits as told by your health care provider. This is important. Contact a health care provider if:  You have a fever.  You have redness, swelling, or yellow drainage around your insertion site. Get help right away if:  You have unusual pain at the radial site.  The catheter insertion area swells very fast.  The insertion area is bleeding, and the bleeding does not stop when you hold steady pressure on the area.  Your arm or hand becomes pale, cool, tingly, or numb. These  symptoms may represent a serious problem that is an emergency. Do not wait to see if the symptoms will go away. Get medical help right away. Call your local emergency services (911 in the [place] Do not drive yourself to the hospital. Summary  After the procedure, it is common to have bruising and tenderness at the site.  Follow instructions from your health care provider about how to take care of your radial site wound. Check the wound every day for signs of infection.  Do not lift anything that is heavier than 10 lb (4.5 kg), or the limit that you are told,  until your health care provider says that it is safe. This information is not intended to replace advice given to you by your health care provider. Make sure you discuss any questions you have with your health care provider. Document Revised: 03/31/2017 Document Reviewed: 03/31/2017 Elsevier Patient Education  2020 [employer].

## 2020-01-22 ENCOUNTER — Telehealth: Payer: Self-pay | Admitting: *Deleted

## 2020-01-22 MED FILL — Heparin Sodium (Porcine) Inj 1000 Unit/ML: INTRAMUSCULAR | Qty: 10 | Status: AC

## 2020-01-22 NOTE — Telephone Encounter (Signed)
Pt does have a new consult appt with [facility] to see Dr. Donata Clay, on 01/23/20 at 4:00 pm.  Will send this message to Dr. Mariah Milling as a general FYI, to make him aware referral appt has been arranged with [facility].

## 2020-01-22 NOTE — Telephone Encounter (Signed)
-----   Message from Antonieta Iba, MD sent at 01/21/2020  2:51 PM EST ----- Recent cardiac catheterization by Dr. Pecola Lawless patient Can we confirm consult placed for CT surgery in [facility] for consideration of CABG Thx TG ----- Message ----- From: Bryna Colander, RN Sent: 01/15/2020   1:04 PM EST To: Reinaldo Berber, MD, #

## 2020-01-23 ENCOUNTER — Encounter: Payer: Self-pay | Admitting: Cardiothoracic Surgery

## 2020-01-23 ENCOUNTER — Other Ambulatory Visit: Payer: Self-pay | Admitting: *Deleted

## 2020-01-23 ENCOUNTER — Institutional Professional Consult (permissible substitution) (INDEPENDENT_AMBULATORY_CARE_PROVIDER_SITE_OTHER): Payer: Medicare Other | Admitting: Cardiothoracic Surgery

## 2020-01-23 ENCOUNTER — Other Ambulatory Visit: Payer: Self-pay

## 2020-01-23 VITALS — BP 162/80 | HR 68 | Resp 18 | Ht 69.5 in | Wt 169.0 lb

## 2020-01-23 DIAGNOSIS — I2510 Atherosclerotic heart disease of native coronary artery without angina pectoris: Secondary | ICD-10-CM | POA: Insufficient documentation

## 2020-01-23 DIAGNOSIS — I208 Other forms of angina pectoris: Secondary | ICD-10-CM | POA: Diagnosis not present

## 2020-01-23 NOTE — Progress Notes (Signed)
PCP is Joaquim Namuncan, Graham S, MD Referring Provider is Joaquim Namuncan, Graham S, MD  Chief Complaint  Patient presents with  . Coronary Artery Disease    Surgical consult, Cath 11/12    HPI: Patient examined, images of coronary arteriograms personally reviewed and discussed with patient and wife. Very nice 79 year old semiretired diabetic non-smoker with recent symptoms of exertional angina.  A cardiac CTA was significant for heavy calcification of the LAD and circumflex and subsequent cardiac catheterization demonstrated severe three-vessel coronary disease with chronic occlusion of LAD and preserved LV function and normal LVEDP.  The patient was recommended for CABG.  The patient has had no angina since his cardiac catheterization 5 days ago.  He was loaded with Plavix prior to the cardiac cath.  Since the cardiac cath he has been on aspirin only and Plavix has been discontinued.  The patient has hypertension and type 2 diabetes as risk factors.  No family history.  No problems with general anesthesia in the past for lumbar laminectomy and knee surgery.  He is allergic to penicillin.  Past Medical History:  Diagnosis Date  . Diabetes mellitus without complication (HCC)   . Hyperlipidemia   . Hypertension   . Insomnia     Past Surgical History:  Procedure Laterality Date  . LAMINECTOMY  1993   L5-S1  . LEFT HEART CATH AND CORONARY ANGIOGRAPHY N/A 01/19/2020   Procedure: LEFT HEART CATH AND CORONARY ANGIOGRAPHY;  Surgeon: Iran OuchArida, Muhammad A, MD;  Location: [facility] INVASIVE CV LAB;  Service: Cardiovascular;  Laterality: N/A;  . TONSILLECTOMY      Family History  Problem Relation Age of Onset  . Hypertension Mother   . Cancer Neg Hx   . COPD Neg Hx   . Diabetes Neg Hx   . Heart disease Neg Hx   . Hyperlipidemia Neg Hx   . Stroke Neg Hx   . Colon cancer Neg Hx   . Prostate cancer Neg Hx     Social History Social History   Tobacco Use  . Smoking status: Never Smoker  . Smokeless tobacco:  Never Used  Vaping Use  . Vaping Use: Never used  Substance Use Topics  . Alcohol use: Yes    Comment: Very rare- occasionaly beer or wine   . Drug use: No    Current Outpatient Medications  Medication Sig Dispense Refill  . amLODipine (NORVASC) 10 MG tablet TAKE 1 TABLET BY MOUTH EVERY DAY (Patient taking differently: Take 10 mg by mouth daily. ) 90 tablet 4  . aspirin EC 81 MG tablet Take 1 tablet (81 mg total) by mouth daily. Swallow whole. 150 tablet 2  . cholecalciferol (VITAMIN D3) 25 MCG (1000 UNIT) tablet Take 1,000 Units by mouth daily.    . Coenzyme Q10 (COQ10) 100 MG CAPS Take 100 mg by mouth daily.     . fenofibrate 160 MG tablet TAKE 1 TABLET BY MOUTH EVERY DAY (Patient taking differently: Take 160 mg by mouth daily. ) 90 tablet 4  . glimepiride (AMARYL) 4 MG tablet TAKE 1 TABLET BY MOUTH EVERY DAY WITH BREAKFAST (Patient taking differently: Take 4 mg by mouth daily with breakfast. ) 90 tablet 1  . isosorbide mononitrate (IMDUR) 30 MG 24 hr tablet Take 1 tablet (30 mg total) by mouth daily. 90 tablet 3  . lisinopril (ZESTRIL) 20 MG tablet TAKE 1 TABLET BY MOUTH EVERY DAY (Patient taking differently: 20 mg daily. ) 90 tablet 1  . lisinopril-hydrochlorothiazide (ZESTORETIC) 20-25 MG tablet TAKE 1  TABLET BY MOUTH EVERY DAY (Patient taking differently: Take 1 tablet by mouth daily. ) 90 tablet 4  . metFORMIN (GLUCOPHAGE) 1000 MG tablet 500mg  in the AM and 1000mg  in the PM (Patient taking differently: Take 1,000 mg by mouth 2 (two) times daily with a meal. 500mg  in the AM and 1000mg  in the P)    . metoprolol tartrate (LOPRESSOR) 50 MG tablet TAKE 1 TABLET BY MOUTH TWICE A DAY (Patient taking differently: Take 50 mg by mouth daily. ) 180 tablet 3  . Misc Natural Products (URINOZINC PO) Take 1 tablet by mouth in the morning and at bedtime.     . Multiple Vitamins-Minerals (PRESERVISION AREDS 2 PO) Take 1 tablet by mouth 2 (two) times daily.     ULTRA test strip TEST TWICE A  DAY. MAX PER INS 1/DAY 100 strip 12  . pravastatin (PRAVACHOL) 20 MG tablet TAKE 1 TABLET BY MOUTH EVERY DAY (Patient taking differently: Take 20 mg by mouth daily. ) 90 tablet 2  . Semaglutide (RYBELSUS) 7 MG TABS 1/2 tab daily for 1 months then 1 tab daily thereafter.  Take in the AM. (Patient taking differently: Take 3.5 mg by mouth daily. 1/2 tab daily for 1 months then 1 tab daily thereafter.  Take in the AM.) 90 tablet 3  . traZODone (DESYREL) 50 MG tablet TAKE 1 TO 1.5 TABLETS BY MOUTH EVERY NIGHT FOR SLEEP (Patient taking differently: Take 75 mg by mouth. ) 135 tablet 3   No current facility-administered medications for this visit.    Allergies  Allergen Reactions  . Amoxil [Amoxicillin] Rash                      Review of Systems :  [ y ] = yes, [  ] = no Right-hand-dominant No history of thoracic trauma He has had Covid vaccines No symptoms of recent respiratory illness He walks about 3 miles 4 days a week.         General :  Weight gain [   ]    Weight loss  [   ]  Fatigue [  ]  Fever [  ]  Chills  [  ]                                          HEENT    Headache [  ]  Dizziness [  ]  Blurred vision [  ] Glaucoma  [  ]                          Nosebleeds [  ] Painful or loose teeth [  ]        Cardiac :  Chest pain/ pressure  ]  Resting SOB [  ] exertional SOB  ]                        Orthopnea [  ]  Pedal edema  [  ]  Palpitations [  ] Syncope/presyncope [ ]                         Paroxysmal nocturnal dyspnea [  ]         Pulmonary : cough [  ]  wheezing [  ]  Hemoptysis [  ] Sputum [  ] Snoring [  ]                              Pneumothorax [  ]  Sleep apnea [  ]        GI : Vomiting [  ]  Dysphagia [  ]  Melena  [  ]  Abdominal pain [  ] BRBPR [  ]              Heart burn [  ]  Constipation [  ] Diarrhea  [  ] Colonoscopy [   ]        GU : Hematuria [  ]  Dysuria [  ]  Nocturia [  ] UTI's [  ]        Vascular : Claudication [  ]  Rest pain [  ]  DVT [  ]  Vein stripping [  ] leg ulcers [  ]                          TIA [  ] Stroke [  ]  Varicose veins [  ]        NEURO :  Headaches  [  ] Seizures [  ] Vision changes [  ] Paresthesias [  ]                                               Musculoskeletal :  Arthritis [  ] Gout  [  ]  Back pain [  ]  Joint pain [  ]        Skin :  Rash [  ]  Melanoma [  ] Sores [  ]        Heme : Bleeding problems [  ]Clotting Disorders [  ] Anemia [  ]Blood Transfusion [ ]         Endocrine : Diabetes [  y] Heat or Cold intolerance [  ] Polyuria [  ]excessive thirst [ ]         Psych : Depression [  ]  Anxiety [  ]  Psych hospitalizations [  ] Memory change [  ]                                                                            BP (!) 162/80 (BP Location: Left Arm, Patient Position: Sitting)   Pulse 68   Resp 18   Ht 5' 9.5" (1.765 m)   Wt 169 lb (76.7 kg)   SpO2 95% Comment: RA with mask on  BMI 24.60 kg/m  Physical Exam       Physical Exam  General: Alert well-developed well-nourished 79 year old male no acute distress HEENT: Normocephalic pupils equal , dentition adequate Neck: Supple without JVD, adenopathy, or bruit Chest: Clear to auscultation, symmetrical breath sounds, no rhonchi, no tenderness             or  deformity Cardiovascular: Regular rate and rhythm, no murmur, no gallop, peripheral pulses             palpable in all extremities Abdomen:  Soft, nontender, no palpable mass or organomegaly Extremities: Warm, well-perfused, no clubbing cyanosis edema or tenderness,              no venous stasis changes of the legs Rectal/GU: Deferred Neuro: Grossly non--focal and symmetrical throughout Skin: Clean and dry without rash or ulceration  Diagnostic Tests: Coronary xerograms reveal chronic occlusion of LAD which is reconstituted by collaterals.  Dominant RCA has significant proximal mid vessel CAD.  The circumflex has a large OM1 branch with a tight proximal stenosis and a  smaller distal circumflex branch with a moderate stenosis.  Impression: Severe multivessel CAD with symptoms of exertional angina Patient has had no recurrent angina since the cardiac cath when he was placed on Imdur. Plan: I discussed the procedure of CABG in detail with the patient and his wife including the indications benefits alternatives and risk.  The surgical be scheduled for Monday, November 22 at [facility] to allow full Plavix washout.  He also has stopped taking his fish oil tablets.  Metformin will be held 48 hours prior to surgery.  Mikey Bussing, MD [facility] [phone]

## 2020-01-25 NOTE — Pre-Procedure Instructions (Signed)
[id number] [address] [place] [address] Phone: [phone] Fax: [phone]      Your procedure is scheduled on Monday, November 22nd at 7:30 a.m.Marland Kitchen  Report to [facility] Main Entrance "A" at 5:30 A.M., and check in at the Admitting office.  Call this number if you have problems the morning of surgery:  [phone]  Call [phone] if you have any questions prior to your surgery date Monday-Friday 8am-4pm    Remember:  Do not eat or drink after midnight the night before your surgery    Take these medicines the morning of surgery with A SIP OF WATER  amLODipine (NORVASC)  isosorbide mononitrate (IMDUR)  metoprolol tartrate (LOPRESSOR)  pravastatin (PRAVACHOL)   As of today, STOP taking any Aleve, Naproxen, Ibuprofen, Motrin, Advil, Goody's, BC's, all herbal medications, fish oil, and all vitamins.   WHAT DO I DO ABOUT MY DIABETES MEDICATION?   . DO NOT TAKE metFORMIN (GLUCOPHAGE), glimepiride (AMARYL), or  Semaglutide (RYBELSUS) on the day of surgery.   HOW TO MANAGE YOUR DIABETES BEFORE AND AFTER SURGERY  Why is it important to control my blood sugar before and after surgery? . Improving blood sugar levels before and after surgery helps healing and can limit problems. . A way of improving blood sugar control is eating a healthy diet by: o  Eating less sugar and carbohydrates o  Increasing activity/exercise o  Talking with your doctor about reaching your blood sugar goals . High blood sugars (greater than 180 mg/dL) can raise your risk of infections and slow your recovery, so you will need to focus on controlling your diabetes during the weeks before surgery. . Make sure that the doctor who takes care of your diabetes knows about your planned surgery including the date and location.  How do I manage my blood sugar before surgery? . Check your blood sugar at least 4 times a day, starting 2 days before surgery, to  make sure that the level is not too high or low. . Check your blood sugar the morning of your surgery when you wake up and every 2 hours until you get to the Short Stay unit. o If your blood sugar is less than 70 mg/dL, you will need to treat for low blood sugar: - Do not take insulin. - Treat a low blood sugar (less than 70 mg/dL) with  cup of clear juice (cranberry or apple), 4 glucose tablets, OR glucose gel. - Recheck blood sugar in 15 minutes after treatment (to make sure it is greater than 70 mg/dL). If your blood sugar is not greater than 70 mg/dL on recheck, call [phone] for further instructions. . Report your blood sugar to the short stay nurse when you get to Short Stay.  . If you are admitted to the hospital after surgery: o Your blood sugar will be checked by the staff and you will probably be given insulin after surgery (instead of oral diabetes medicines) to make sure you have good blood sugar levels. o The goal for blood sugar control after surgery is 80-180 mg/dL.                      Do not wear jewelry.            Do not wear lotions, powders, colognes, or deodorant.            Men may shave face and neck.  Do not bring valuables to the hospital.            [facility] is not responsible for any belongings or valuables.  Do NOT Smoke (Tobacco/Vaping) or drink Alcohol 24 hours prior to your procedure If you use a CPAP at night, you may bring all equipment for your overnight stay.   Contacts, glasses, dentures or bridgework may not be worn into surgery.      For patients admitted to the hospital, discharge time will be determined by your treatment team.   Patients discharged the day of surgery will not be allowed to drive home, and someone needs to stay with them for 24 hours.    Special instructions:   [place] [facility]- Preparing For Surgery  Before surgery, you can play an important role. Because skin is not sterile, your skin needs to be as free of germs  as possible. You can reduce the number of germs on your skin by washing with CHG (chlorahexidine gluconate) Soap before surgery.  CHG is an antiseptic cleaner which kills germs and bonds with the skin to continue killing germs even after washing.    Oral Hygiene is also important to reduce your risk of infection.  Remember - BRUSH YOUR TEETH THE MORNING OF SURGERY WITH YOUR REGULAR TOOTHPASTE  Please do not use if you have an allergy to CHG or antibacterial soaps. If your skin becomes reddened/irritated stop using the CHG.  Do not shave (including legs and underarms) for at least 48 hours prior to first CHG shower. It is OK to shave your face.  Please follow these instructions carefully.   1. Shower the NIGHT BEFORE SURGERY and the MORNING OF SURGERY with CHG Soap.   2. If you chose to wash your hair, wash your hair first as usual with your normal shampoo.  3. After you shampoo, rinse your hair and body thoroughly to remove the shampoo.  4. Use CHG as you would any other liquid soap. You can apply CHG directly to the skin and wash gently with a scrungie or a clean washcloth.   5. Apply the CHG Soap to your body ONLY FROM THE NECK DOWN.  Do not use on open wounds or open sores. Avoid contact with your eyes, ears, mouth and genitals (private parts). Wash Face and genitals (private parts)  with your normal soap.   6. Wash thoroughly, paying special attention to the area where your surgery will be performed.  7. Thoroughly rinse your body with warm water from the neck down.  8. DO NOT shower/wash with your normal soap after using and rinsing off the CHG Soap.  9. Pat yourself dry with a CLEAN TOWEL.  10. Wear CLEAN PAJAMAS to bed the night before surgery  11. Place CLEAN SHEETS on your bed the night of your first shower and DO NOT SLEEP WITH PETS.   Day of Surgery: Wear Clean/Comfortable clothing the morning of surgery Do not apply any deodorants/lotions.   Remember to brush your teeth  WITH YOUR REGULAR TOOTHPASTE.   Please read over the following fact sheets that you were given.

## 2020-01-26 ENCOUNTER — Ambulatory Visit (HOSPITAL_BASED_OUTPATIENT_CLINIC_OR_DEPARTMENT_OTHER)
Admission: RE | Admit: 2020-01-26 | Discharge: 2020-01-26 | Disposition: A | Discharge status: Home / Self Care | Payer: Medicare Other | Source: Ambulatory Visit | Attending: Cardiothoracic Surgery | Admitting: Cardiothoracic Surgery

## 2020-01-26 ENCOUNTER — Other Ambulatory Visit (HOSPITAL_COMMUNITY)
Admission: RE | Admit: 2020-01-26 | Discharge: 2020-01-26 | Disposition: A | Discharge status: Home / Self Care | Payer: Medicare Other | Source: Ambulatory Visit | Attending: Cardiothoracic Surgery | Admitting: Cardiothoracic Surgery

## 2020-01-26 ENCOUNTER — Ambulatory Visit (HOSPITAL_COMMUNITY)
Admission: RE | Admit: 2020-01-26 | Discharge: 2020-01-26 | Disposition: A | Discharge status: Home / Self Care | Payer: Medicare Other | Source: Ambulatory Visit | Attending: Cardiothoracic Surgery | Admitting: Cardiothoracic Surgery

## 2020-01-26 ENCOUNTER — Other Ambulatory Visit: Payer: Self-pay

## 2020-01-26 ENCOUNTER — Encounter (HOSPITAL_COMMUNITY): Payer: Self-pay

## 2020-01-26 ENCOUNTER — Encounter (HOSPITAL_COMMUNITY)
Admission: RE | Admit: 2020-01-26 | Discharge: 2020-01-26 | Disposition: A | Discharge status: Home / Self Care | Payer: Medicare Other | Source: Ambulatory Visit | Attending: Cardiothoracic Surgery | Admitting: Cardiothoracic Surgery

## 2020-01-26 DIAGNOSIS — I119 Hypertensive heart disease without heart failure: Secondary | ICD-10-CM | POA: Insufficient documentation

## 2020-01-26 DIAGNOSIS — Z01818 Encounter for other preprocedural examination: Secondary | ICD-10-CM | POA: Insufficient documentation

## 2020-01-26 DIAGNOSIS — E785 Hyperlipidemia, unspecified: Secondary | ICD-10-CM | POA: Insufficient documentation

## 2020-01-26 DIAGNOSIS — I2510 Atherosclerotic heart disease of native coronary artery without angina pectoris: Secondary | ICD-10-CM

## 2020-01-26 DIAGNOSIS — Z20822 Contact with and (suspected) exposure to covid-19: Secondary | ICD-10-CM | POA: Insufficient documentation

## 2020-01-26 DIAGNOSIS — Z951 Presence of aortocoronary bypass graft: Secondary | ICD-10-CM | POA: Diagnosis not present

## 2020-01-26 DIAGNOSIS — E119 Type 2 diabetes mellitus without complications: Secondary | ICD-10-CM | POA: Insufficient documentation

## 2020-01-26 HISTORY — DX: Myoneural disorder, unspecified: G70.9

## 2020-01-26 HISTORY — DX: Atherosclerotic heart disease of native coronary artery without angina pectoris: I25.10

## 2020-01-26 LAB — COMPREHENSIVE METABOLIC PANEL
ALT: 24 U/L (ref 0–44)
AST: 24 U/L (ref 15–41)
Albumin: 4.5 g/dL (ref 3.5–5.0)
Alkaline Phosphatase: 33 U/L — ABNORMAL LOW (ref 38–126)
Anion gap: 13 (ref 5–15)
BUN: 23 mg/dL (ref 8–23)
CO2: 20 mmol/L — ABNORMAL LOW (ref 22–32)
Calcium: 10 mg/dL (ref 8.9–10.3)
Chloride: 101 mmol/L (ref 98–111)
Creatinine, Ser: 1.22 mg/dL (ref 0.61–1.24)
GFR, Estimated: >60 mL/min (ref >60)
Glucose, Bld: 265 mg/dL — ABNORMAL HIGH (ref 70–99)
Potassium: 3.9 mmol/L (ref 3.5–5.1)
Sodium: 134 mmol/L — ABNORMAL LOW (ref 135–145)
Total Bilirubin: 0.4 mg/dL (ref 0.3–1.2)
Total Protein: 7.2 g/dL (ref 6.5–8.1)

## 2020-01-26 LAB — BLOOD GAS, ARTERIAL
Acid-Base Excess: 0.4 mmol/L (ref 0.0–2.0)
Bicarbonate: 24.5 mmol/L (ref 20.0–28.0)
Drawn by: 602861
FIO2: 21
O2 Saturation: 98.6 %
Patient temperature: 37
pCO2 arterial: 39 mmHg (ref 32.0–48.0)
pH, Arterial: 7.414 (ref 7.350–7.450)
pO2, Arterial: 127 mmHg — ABNORMAL HIGH (ref 83.0–108.0)

## 2020-01-26 LAB — CBC
HCT: 42.1 % (ref 39.0–52.0)
Hemoglobin: 14.1 g/dL (ref 13.0–17.0)
MCH: 28.6 pg (ref 26.0–34.0)
MCHC: 33.5 g/dL (ref 30.0–36.0)
MCV: 85.4 fL (ref 80.0–100.0)
Platelets: 277 10*3/uL (ref 150–400)
RBC: 4.93 MIL/uL (ref 4.22–5.81)
RDW: 13.8 % (ref 11.5–15.5)
WBC: 4.7 10*3/uL (ref 4.0–10.5)
nRBC: 0 % (ref 0.0–0.2)

## 2020-01-26 LAB — ECHOCARDIOGRAM COMPLETE
Area-P 1/2: 2.11 cm2
Height: 69.5 in
S' Lateral: 3 cm
Weight: 2737.6 oz

## 2020-01-26 LAB — TYPE AND SCREEN
ABO/RH(D): A POS
Antibody Screen: NEGATIVE

## 2020-01-26 LAB — SURGICAL PCR SCREEN
MRSA, PCR: NEGATIVE
Staphylococcus aureus: NEGATIVE

## 2020-01-26 LAB — URINALYSIS, ROUTINE W REFLEX MICROSCOPIC
Bacteria, UA: NONE SEEN
Bilirubin Urine: NEGATIVE
Glucose, UA: >=500 mg/dL — AB
Hgb urine dipstick: NEGATIVE
Ketones, ur: NEGATIVE mg/dL
Leukocytes,Ua: NEGATIVE
Nitrite: NEGATIVE
Protein, ur: NEGATIVE mg/dL
Specific Gravity, Urine: 1.017 (ref 1.005–1.030)
pH: 6 (ref 5.0–8.0)

## 2020-01-26 LAB — APTT: aPTT: 24 seconds (ref 24–36)

## 2020-01-26 LAB — HEMOGLOBIN A1C
Hgb A1c MFr Bld: 7 % — ABNORMAL HIGH (ref 4.8–5.6)
Mean Plasma Glucose: 154.2 mg/dL

## 2020-01-26 LAB — PROTIME-INR
INR: 1 (ref 0.8–1.2)
Prothrombin Time: 12.8 seconds (ref 11.4–15.2)

## 2020-01-26 LAB — GLUCOSE, CAPILLARY: Glucose-Capillary: 300 mg/dL — ABNORMAL HIGH (ref 70–99)

## 2020-01-26 LAB — SARS CORONAVIRUS 2 (TAT 6-24 HRS): SARS Coronavirus 2: NEGATIVE

## 2020-01-26 MED ORDER — POTASSIUM CHLORIDE 2 MEQ/ML IV SOLN
80.0000 meq | INTRAVENOUS | Status: DC
  Filled 2020-01-26: qty 40

## 2020-01-26 MED ORDER — VANCOMYCIN HCL 1250 MG/250ML IV SOLN
1250.0000 mg | INTRAVENOUS | Status: AC
  Administered 2020-01-29: 1250 mg via INTRAVENOUS
  Filled 2020-01-26: qty 250

## 2020-01-26 MED ORDER — PHENYLEPHRINE HCL-NACL 20-0.9 MG/250ML-% IV SOLN
30.0000 ug/min | INTRAVENOUS | Status: AC
  Administered 2020-01-29: 25 ug/min via INTRAVENOUS
  Filled 2020-01-26: qty 250

## 2020-01-26 MED ORDER — NOREPINEPHRINE 4 MG/250ML-% IV SOLN
0.0000 ug/min | INTRAVENOUS | Status: DC
  Filled 2020-01-26: qty 250

## 2020-01-26 MED ORDER — TRANEXAMIC ACID (OHS) PUMP PRIME SOLUTION
2.0000 mg/kg | INTRAVENOUS | Status: DC
  Filled 2020-01-26: qty 1.55

## 2020-01-26 MED ORDER — SODIUM CHLORIDE 0.9 % IV SOLN
INTRAVENOUS | Status: DC
  Filled 2020-01-26: qty 30

## 2020-01-26 MED ORDER — SODIUM CHLORIDE 0.9 % IV SOLN
1.5000 g | INTRAVENOUS | Status: AC
  Administered 2020-01-29: 1.5 g via INTRAVENOUS
  Filled 2020-01-26: qty 1.5

## 2020-01-26 MED ORDER — TRANEXAMIC ACID (OHS) BOLUS VIA INFUSION
15.0000 mg/kg | INTRAVENOUS | Status: AC
  Administered 2020-01-29: 1164 mg via INTRAVENOUS
  Filled 2020-01-26: qty 1164

## 2020-01-26 MED ORDER — SODIUM CHLORIDE 0.9 % IV SOLN
750.0000 mg | INTRAVENOUS | Status: AC
  Administered 2020-01-29: 750 mg via INTRAVENOUS
  Filled 2020-01-26: qty 750

## 2020-01-26 MED ORDER — MILRINONE LACTATE IN DEXTROSE 20-5 MG/100ML-% IV SOLN
0.3000 ug/kg/min | INTRAVENOUS | Status: AC
  Administered 2020-01-29: .25 ug/kg/min via INTRAVENOUS
  Filled 2020-01-26: qty 100

## 2020-01-26 MED ORDER — EPINEPHRINE HCL 5 MG/250ML IV SOLN IN NS
0.0000 ug/min | INTRAVENOUS | Status: DC
  Filled 2020-01-26: qty 250

## 2020-01-26 MED ORDER — INSULIN REGULAR(HUMAN) IN NACL 100-0.9 UT/100ML-% IV SOLN
INTRAVENOUS | Status: AC
  Administered 2020-01-29: 9.5 [IU]/h via INTRAVENOUS
  Filled 2020-01-26: qty 100

## 2020-01-26 MED ORDER — NITROGLYCERIN IN D5W 200-5 MCG/ML-% IV SOLN
2.0000 ug/min | INTRAVENOUS | Status: DC
  Filled 2020-01-26: qty 250

## 2020-01-26 MED ORDER — DEXMEDETOMIDINE HCL IN NACL 400 MCG/100ML IV SOLN
0.1000 ug/kg/h | INTRAVENOUS | Status: AC
  Administered 2020-01-29: .5 ug/kg/h via INTRAVENOUS
  Filled 2020-01-26: qty 100

## 2020-01-26 MED ORDER — MAGNESIUM SULFATE 50 % IJ SOLN
40.0000 meq | INTRAMUSCULAR | Status: DC
  Filled 2020-01-26: qty 9.85

## 2020-01-26 MED ORDER — PLASMA-LYTE 148 IV SOLN
INTRAVENOUS | Status: DC
  Filled 2020-01-26: qty 2.5

## 2020-01-26 MED ORDER — TRANEXAMIC ACID 1000 MG/10ML IV SOLN
1.5000 mg/kg/h | INTRAVENOUS | Status: AC
  Administered 2020-01-29: 1.5 mg/kg/h via INTRAVENOUS
  Filled 2020-01-26: qty 25

## 2020-01-26 NOTE — Progress Notes (Signed)
Notified James, PA-C of CBG of 300. Pt stating his fasting CBG is 150 in the office. Does not check CBG at home, only checks A1Cs in the office.

## 2020-01-26 NOTE — Progress Notes (Signed)
  Echocardiogram 2D Echocardiogram has been performed.  Luis Wise 01/26/2020, 10:54 AM

## 2020-01-26 NOTE — Pre-Procedure Instructions (Signed)
[id number] [address] [place] [address] Phone: [phone] Fax: [phone]      Your procedure is scheduled on Monday, November 22nd at 7:30 a.m.Marland Kitchen  Report to [facility] Main Entrance "A" at 5:30 A.M., and check in at the Admitting office.  Call this number if you have problems the morning of surgery:  [phone]  Call [phone] if you have any questions prior to your surgery date Monday-Friday 8am-4pm    Remember:  Do not eat or drink after midnight the night before your surgery    Take these medicines the morning of surgery with A SIP OF WATER  amLODipine (NORVASC)  isosorbide mononitrate (IMDUR)  metoprolol tartrate (LOPRESSOR)  pravastatin (PRAVACHOL)   As of today, STOP taking any Aleve, Naproxen, Ibuprofen, Motrin, Advil, Goody's, BC's, all herbal medications, fish oil, and all vitamins.   WHAT DO I DO ABOUT MY DIABETES MEDICATION?  -Hold Metformin for 2 DAYS prior to surgery. Do not take your metformin 01/27/20 and 01/28/20. . DO NOT TAKE metFORMIN (GLUCOPHAGE), glimepiride (AMARYL), or  Semaglutide (RYBELSUS) on the day of surgery.   HOW TO MANAGE YOUR DIABETES BEFORE AND AFTER SURGERY  Why is it important to control my blood sugar before and after surgery? . Improving blood sugar levels before and after surgery helps healing and can limit problems. . A way of improving blood sugar control is eating a healthy diet by: o  Eating less sugar and carbohydrates o  Increasing activity/exercise o  Talking with your doctor about reaching your blood sugar goals . High blood sugars (greater than 180 mg/dL) can raise your risk of infections and slow your recovery, so you will need to focus on controlling your diabetes during the weeks before surgery. . Make sure that the doctor who takes care of your diabetes knows about your planned surgery including the date and location.  How do I manage my blood sugar before  surgery? . Check your blood sugar at least 4 times a day, starting 2 days before surgery, to make sure that the level is not too high or low. . Check your blood sugar the morning of your surgery when you wake up and every 2 hours until you get to the Short Stay unit. o If your blood sugar is less than 70 mg/dL, you will need to treat for low blood sugar: - Do not take insulin. - Treat a low blood sugar (less than 70 mg/dL) with  cup of clear juice (cranberry or apple), 4 glucose tablets, OR glucose gel. - Recheck blood sugar in 15 minutes after treatment (to make sure it is greater than 70 mg/dL). If your blood sugar is not greater than 70 mg/dL on recheck, call [phone] for further instructions. . Report your blood sugar to the short stay nurse when you get to Short Stay.  . If you are admitted to the hospital after surgery: o Your blood sugar will be checked by the staff and you will probably be given insulin after surgery (instead of oral diabetes medicines) to make sure you have good blood sugar levels. o The goal for blood sugar control after surgery is 80-180 mg/dL.                      Do not wear jewelry.            Do not wear lotions, powders, colognes, or deodorant.  Men may shave face and neck.            Do not bring valuables to the hospital.            [facility] is not responsible for any belongings or valuables.  Do NOT Smoke (Tobacco/Vaping) or drink Alcohol 24 hours prior to your procedure If you use a CPAP at night, you may bring all equipment for your overnight stay.   Contacts, glasses, dentures or bridgework may not be worn into surgery.      For patients admitted to the hospital, discharge time will be determined by your treatment team.   Patients discharged the day of surgery will not be allowed to drive home, and someone needs to stay with them for 24 hours.    Special instructions:   [facility]- Preparing For Surgery  Before surgery, you  can play an important role. Because skin is not sterile, your skin needs to be as free of germs as possible. You can reduce the number of germs on your skin by washing with CHG (chlorahexidine gluconate) Soap before surgery.  CHG is an antiseptic cleaner which kills germs and bonds with the skin to continue killing germs even after washing.    Oral Hygiene is also important to reduce your risk of infection.  Remember - BRUSH YOUR TEETH THE MORNING OF SURGERY WITH YOUR REGULAR TOOTHPASTE  Please do not use if you have an allergy to CHG or antibacterial soaps. If your skin becomes reddened/irritated stop using the CHG.  Do not shave (including legs and underarms) for at least 48 hours prior to first CHG shower. It is OK to shave your face.  Please follow these instructions carefully.   1. Shower the NIGHT BEFORE SURGERY and the MORNING OF SURGERY with CHG Soap.   2. If you chose to wash your hair, wash your hair first as usual with your normal shampoo.  3. After you shampoo, rinse your hair and body thoroughly to remove the shampoo.  4. Use CHG as you would any other liquid soap. You can apply CHG directly to the skin and wash gently with a scrungie or a clean washcloth.   5. Apply the CHG Soap to your body ONLY FROM THE NECK DOWN.  Do not use on open wounds or open sores. Avoid contact with your eyes, ears, mouth and genitals (private parts). Wash Face and genitals (private parts)  with your normal soap.   6. Wash thoroughly, paying special attention to the area where your surgery will be performed.  7. Thoroughly rinse your body with warm water from the neck down.  8. DO NOT shower/wash with your normal soap after using and rinsing off the CHG Soap.  9. Pat yourself dry with a CLEAN TOWEL.  10. Wear CLEAN PAJAMAS to bed the night before surgery  11. Place CLEAN SHEETS on your bed the night of your first shower and DO NOT SLEEP WITH PETS.   Day of Surgery: Wear Clean/Comfortable  clothing the morning of surgery Do not apply any deodorants/lotions.   Remember to brush your teeth WITH YOUR REGULAR TOOTHPASTE.   Please read over the following fact sheets that you were given.

## 2020-01-26 NOTE — Progress Notes (Addendum)
PCP - Crawford Givens Cardiologist - Dr. Zenaida Niece trigt  PPM/ICD - denies   Chest x-ray - 01/26/2020 EKG - 01/02/20 Stress Test - denies ECHO - 01/26/2020 Cardiac Cath - 01/19/20  Sleep Study - denies   Fasting Blood Sugar - 150 Does not check CBGs at home.   Follow your surgeon's instructions on when to stop Aspirin. Pt was given specific instructions on when to stop aspirin in his education book from the cardiology office.      ERAS Protcol -no    COVID TEST- 01/26/2020 1200   Anesthesia review: yes, cardiac history. Being taken care of by Dr. Donata Clay  Patient denies shortness of breath, fever, cough and chest pain at PAT appointment   All instructions explained to the patient, with a verbal understanding of the material. Patient agrees to go over the instructions while at home for a better understanding. Patient also instructed to self quarantine after being tested for COVID-19. The opportunity to ask questions was provided.  **Pt instructed to go to echocardiogram and doppler appointments following this appointment today. He verbalized understanding.

## 2020-01-27 ENCOUNTER — Other Ambulatory Visit: Payer: Self-pay | Admitting: Family Medicine

## 2020-01-27 DIAGNOSIS — E119 Type 2 diabetes mellitus without complications: Secondary | ICD-10-CM

## 2020-01-28 ENCOUNTER — Encounter: Payer: Self-pay | Admitting: Family Medicine

## 2020-01-28 NOTE — Anesthesia Preprocedure Evaluation (Addendum)
Anesthesia Evaluation  Patient identified by MRN, date of birth, ID band Patient awake    Reviewed: Allergy & Precautions, NPO status , Patient's Chart, lab work & pertinent test results  History of Anesthesia Complications Negative for: history of anesthetic complications  Airway Mallampati: III  TM Distance: >3 FB Neck ROM: Full    Dental  (+) Dental Advisory Given, Teeth Intact   Pulmonary neg pulmonary ROS,    Pulmonary exam normal        Cardiovascular hypertension, Pt. on medications and Pt. on home beta blockers + CAD  Normal cardiovascular exam   '21 TTE - EF 55 to 60%. There is mild concentric left  ventricular hypertrophy. Grade I diastolic dysfunction (impaired relaxation). There is mild hypokinesis of the left ventricular, apical septal wall. Trivial MR.  '21 Carotid US - 1-39% b/l ICAS  '21 Cath -   Prox RCA lesion is 60% stenosed.  Dist RCA lesion is 50% stenosed.  RPDA lesion is 50% stenosed.  Mid RCA lesion is 70% stenosed.  2nd Mrg lesion is 80% stenosed.  Dist Cx lesion is 80% stenosed.  Prox LAD to Mid LAD lesion is 100% stenosed.  1st Diag lesion is 80% stenosed.  Prox LAD lesion is 70% stenosed.    Neuro/Psych negative neurological ROS  negative psych ROS   GI/Hepatic negative GI ROS, Neg liver ROS,   Endo/Other  diabetes, Type 2, Oral Hypoglycemic Agents  Renal/GU negative Renal ROS     Musculoskeletal negative musculoskeletal ROS (+)   Abdominal   Peds  Hematology negative hematology ROS (+)   Anesthesia Other Findings Covid test negative   Reproductive/Obstetrics                            Anesthesia Physical Anesthesia Plan  ASA: IV  Anesthesia Plan: General   Post-op Pain Management:    Induction: Intravenous  PONV Risk Score and Plan: 2 and Treatment may vary due to age or medical condition  Airway Management Planned: Oral  ETT  Additional Equipment: Arterial line, CVP, PA Cath, TEE and Ultrasound Guidance Line Placement  Intra-op Plan:   Post-operative Plan: Extubation in OR  Informed Consent: I have reviewed the patients History and Physical, chart, labs and discussed the procedure including the risks, benefits and alternatives for the proposed anesthesia with the patient or authorized representative who has indicated his/her understanding and acceptance.     Dental advisory given  Plan Discussed with: CRNA and Anesthesiologist  Anesthesia Plan Comments:        Anesthesia Quick Evaluation

## 2020-01-29 ENCOUNTER — Inpatient Hospital Stay (HOSPITAL_COMMUNITY): Payer: Medicare Other

## 2020-01-29 ENCOUNTER — Inpatient Hospital Stay (HOSPITAL_COMMUNITY): Payer: Medicare Other | Admitting: Physician Assistant

## 2020-01-29 ENCOUNTER — Inpatient Hospital Stay (HOSPITAL_COMMUNITY)
Admission: RE | Disposition: A | Discharge status: Home / Self Care | Payer: Self-pay | Source: Home / Self Care | Attending: Cardiothoracic Surgery

## 2020-01-29 ENCOUNTER — Inpatient Hospital Stay (HOSPITAL_COMMUNITY): Payer: Medicare Other | Admitting: Anesthesiology

## 2020-01-29 ENCOUNTER — Encounter (HOSPITAL_COMMUNITY): Payer: Self-pay | Admitting: Cardiothoracic Surgery

## 2020-01-29 ENCOUNTER — Other Ambulatory Visit: Payer: Self-pay

## 2020-01-29 ENCOUNTER — Inpatient Hospital Stay (HOSPITAL_COMMUNITY)
Admission: RE | Admit: 2020-01-29 | Discharge: 2020-02-03 | DRG: 236 | Disposition: A | Discharge status: Home / Self Care | Payer: Medicare Other | Attending: Cardiothoracic Surgery | Admitting: Cardiothoracic Surgery

## 2020-01-29 DIAGNOSIS — I25110 Atherosclerotic heart disease of native coronary artery with unstable angina pectoris: Secondary | ICD-10-CM | POA: Diagnosis not present

## 2020-01-29 DIAGNOSIS — I4891 Unspecified atrial fibrillation: Secondary | ICD-10-CM | POA: Diagnosis not present

## 2020-01-29 DIAGNOSIS — I517 Cardiomegaly: Secondary | ICD-10-CM | POA: Diagnosis not present

## 2020-01-29 DIAGNOSIS — E1140 Type 2 diabetes mellitus with diabetic neuropathy, unspecified: Secondary | ICD-10-CM | POA: Diagnosis not present

## 2020-01-29 DIAGNOSIS — Z20822 Contact with and (suspected) exposure to covid-19: Secondary | ICD-10-CM | POA: Diagnosis not present

## 2020-01-29 DIAGNOSIS — E785 Hyperlipidemia, unspecified: Secondary | ICD-10-CM | POA: Diagnosis not present

## 2020-01-29 DIAGNOSIS — E119 Type 2 diabetes mellitus without complications: Secondary | ICD-10-CM | POA: Diagnosis not present

## 2020-01-29 DIAGNOSIS — I10 Essential (primary) hypertension: Secondary | ICD-10-CM | POA: Diagnosis present

## 2020-01-29 DIAGNOSIS — Z880 Allergy status to penicillin: Secondary | ICD-10-CM

## 2020-01-29 DIAGNOSIS — G4700 Insomnia, unspecified: Secondary | ICD-10-CM | POA: Diagnosis not present

## 2020-01-29 DIAGNOSIS — Z8249 Family history of ischemic heart disease and other diseases of the circulatory system: Secondary | ICD-10-CM

## 2020-01-29 DIAGNOSIS — J9811 Atelectasis: Secondary | ICD-10-CM | POA: Diagnosis not present

## 2020-01-29 DIAGNOSIS — Z951 Presence of aortocoronary bypass graft: Secondary | ICD-10-CM | POA: Diagnosis not present

## 2020-01-29 DIAGNOSIS — I071 Rheumatic tricuspid insufficiency: Secondary | ICD-10-CM | POA: Diagnosis not present

## 2020-01-29 DIAGNOSIS — Z7984 Long term (current) use of oral hypoglycemic drugs: Secondary | ICD-10-CM

## 2020-01-29 DIAGNOSIS — I25119 Atherosclerotic heart disease of native coronary artery with unspecified angina pectoris: Secondary | ICD-10-CM | POA: Diagnosis not present

## 2020-01-29 DIAGNOSIS — I2510 Atherosclerotic heart disease of native coronary artery without angina pectoris: Secondary | ICD-10-CM

## 2020-01-29 HISTORY — PX: CORONARY ARTERY BYPASS GRAFT: SHX141

## 2020-01-29 HISTORY — PX: TEE WITHOUT CARDIOVERSION: SHX5443

## 2020-01-29 HISTORY — PX: ENDOVEIN HARVEST OF GREATER SAPHENOUS VEIN: SHX5059

## 2020-01-29 LAB — POCT I-STAT 7, (LYTES, BLD GAS, ICA,H+H)
Acid-Base Excess: 1 mmol/L (ref 0.0–2.0)
Acid-Base Excess: 1 mmol/L (ref 0.0–2.0)
Acid-Base Excess: 3 mmol/L — ABNORMAL HIGH (ref 0.0–2.0)
Acid-Base Excess: 1 mmol/L (ref 0.0–2.0)
Acid-Base Excess: 1 mmol/L (ref 0.0–2.0)
Acid-base deficit: 1 mmol/L (ref 0.0–2.0)
Bicarbonate: 27.1 mmol/L (ref 20.0–28.0)
Bicarbonate: 27 mmol/L (ref 20.0–28.0)
Bicarbonate: 27.8 mmol/L (ref 20.0–28.0)
Bicarbonate: 25.8 mmol/L (ref 20.0–28.0)
Bicarbonate: 24.4 mmol/L (ref 20.0–28.0)
Bicarbonate: 26.3 mmol/L (ref 20.0–28.0)
Calcium, Ion: 1.31 mmol/L (ref 1.15–1.40)
Calcium, Ion: 1.01 mmol/L — ABNORMAL LOW (ref 1.15–1.40)
Calcium, Ion: 1.11 mmol/L — ABNORMAL LOW (ref 1.15–1.40)
Calcium, Ion: 1.09 mmol/L — ABNORMAL LOW (ref 1.15–1.40)
Calcium, Ion: 1.08 mmol/L — ABNORMAL LOW (ref 1.15–1.40)
Calcium, Ion: 1.1 mmol/L — ABNORMAL LOW (ref 1.15–1.40)
HCT: 40 % (ref 39.0–52.0)
HCT: 30 % — ABNORMAL LOW (ref 39.0–52.0)
HCT: 31 % — ABNORMAL LOW (ref 39.0–52.0)
HCT: 31 % — ABNORMAL LOW (ref 39.0–52.0)
HCT: 30 % — ABNORMAL LOW (ref 39.0–52.0)
HCT: 31 % — ABNORMAL LOW (ref 39.0–52.0)
Hemoglobin: 13.6 g/dL (ref 13.0–17.0)
Hemoglobin: 10.2 g/dL — ABNORMAL LOW (ref 13.0–17.0)
Hemoglobin: 10.5 g/dL — ABNORMAL LOW (ref 13.0–17.0)
Hemoglobin: 10.5 g/dL — ABNORMAL LOW (ref 13.0–17.0)
Hemoglobin: 10.2 g/dL — ABNORMAL LOW (ref 13.0–17.0)
Hemoglobin: 10.5 g/dL — ABNORMAL LOW (ref 13.0–17.0)
O2 Saturation: 100 %
O2 Saturation: 100 %
O2 Saturation: 67 %
O2 Saturation: 100 %
O2 Saturation: 100 %
O2 Saturation: 98 %
Potassium: 3.7 mmol/L (ref 3.5–5.1)
Potassium: 3.6 mmol/L (ref 3.5–5.1)
Potassium: 3.9 mmol/L (ref 3.5–5.1)
Potassium: 3.6 mmol/L (ref 3.5–5.1)
Potassium: 3.3 mmol/L — ABNORMAL LOW (ref 3.5–5.1)
Potassium: 3.8 mmol/L (ref 3.5–5.1)
Sodium: 140 mmol/L (ref 135–145)
Sodium: 141 mmol/L (ref 135–145)
Sodium: 141 mmol/L (ref 135–145)
Sodium: 139 mmol/L (ref 135–145)
Sodium: 140 mmol/L (ref 135–145)
Sodium: 142 mmol/L (ref 135–145)
TCO2: 29 mmol/L (ref 22–32)
TCO2: 28 mmol/L (ref 22–32)
TCO2: 29 mmol/L (ref 22–32)
TCO2: 27 mmol/L (ref 22–32)
TCO2: 26 mmol/L (ref 22–32)
TCO2: 28 mmol/L (ref 22–32)
pCO2 arterial: 48.6 mmHg — ABNORMAL HIGH (ref 32.0–48.0)
pCO2 arterial: 43.7 mmHg (ref 32.0–48.0)
pCO2 arterial: 47.6 mmHg (ref 32.0–48.0)
pCO2 arterial: 39.8 mmHg (ref 32.0–48.0)
pCO2 arterial: 42.4 mmHg (ref 32.0–48.0)
pCO2 arterial: 43 mmHg (ref 32.0–48.0)
pH, Arterial: 7.354 (ref 7.350–7.450)
pH, Arterial: 7.362 (ref 7.350–7.450)
pH, Arterial: 7.412 (ref 7.350–7.450)
pH, Arterial: 7.42 (ref 7.350–7.450)
pH, Arterial: 7.368 (ref 7.350–7.450)
pH, Arterial: 7.394 (ref 7.350–7.450)
pO2, Arterial: 267 mmHg — ABNORMAL HIGH (ref 83.0–108.0)
pO2, Arterial: 36 mmHg — CL (ref 83.0–108.0)
pO2, Arterial: 366 mmHg — ABNORMAL HIGH (ref 83.0–108.0)
pO2, Arterial: 380 mmHg — ABNORMAL HIGH (ref 83.0–108.0)
pO2, Arterial: 114 mmHg — ABNORMAL HIGH (ref 83.0–108.0)
pO2, Arterial: 413 mmHg — ABNORMAL HIGH (ref 83.0–108.0)

## 2020-01-29 LAB — BASIC METABOLIC PANEL
Anion gap: 8 (ref 5–15)
BUN: 14 mg/dL (ref 8–23)
CO2: 22 mmol/L (ref 22–32)
Calcium: 8 mg/dL — ABNORMAL LOW (ref 8.9–10.3)
Chloride: 109 mmol/L (ref 98–111)
Creatinine, Ser: 1.05 mg/dL (ref 0.61–1.24)
GFR, Estimated: >60 mL/min (ref >60)
Glucose, Bld: 137 mg/dL — ABNORMAL HIGH (ref 70–99)
Potassium: 3.5 mmol/L (ref 3.5–5.1)
Sodium: 139 mmol/L (ref 135–145)

## 2020-01-29 LAB — POCT I-STAT, CHEM 8
BUN: 21 mg/dL (ref 8–23)
BUN: 22 mg/dL (ref 8–23)
BUN: 19 mg/dL (ref 8–23)
BUN: 18 mg/dL (ref 8–23)
BUN: 17 mg/dL (ref 8–23)
Calcium, Ion: 1.32 mmol/L (ref 1.15–1.40)
Calcium, Ion: 1.31 mmol/L (ref 1.15–1.40)
Calcium, Ion: 1.1 mmol/L — ABNORMAL LOW (ref 1.15–1.40)
Calcium, Ion: 1.09 mmol/L — ABNORMAL LOW (ref 1.15–1.40)
Calcium, Ion: 1.11 mmol/L — ABNORMAL LOW (ref 1.15–1.40)
Chloride: 101 mmol/L (ref 98–111)
Chloride: 101 mmol/L (ref 98–111)
Chloride: 101 mmol/L (ref 98–111)
Chloride: 100 mmol/L (ref 98–111)
Chloride: 103 mmol/L (ref 98–111)
Creatinine, Ser: 0.9 mg/dL (ref 0.61–1.24)
Creatinine, Ser: 1 mg/dL (ref 0.61–1.24)
Creatinine, Ser: 1 mg/dL (ref 0.61–1.24)
Creatinine, Ser: 1 mg/dL (ref 0.61–1.24)
Creatinine, Ser: 1 mg/dL (ref 0.61–1.24)
Glucose, Bld: 228 mg/dL — ABNORMAL HIGH (ref 70–99)
Glucose, Bld: 203 mg/dL — ABNORMAL HIGH (ref 70–99)
Glucose, Bld: 160 mg/dL — ABNORMAL HIGH (ref 70–99)
Glucose, Bld: 142 mg/dL — ABNORMAL HIGH (ref 70–99)
Glucose, Bld: 130 mg/dL — ABNORMAL HIGH (ref 70–99)
HCT: 39 % (ref 39.0–52.0)
HCT: 37 % — ABNORMAL LOW (ref 39.0–52.0)
HCT: 30 % — ABNORMAL LOW (ref 39.0–52.0)
HCT: 30 % — ABNORMAL LOW (ref 39.0–52.0)
HCT: 29 % — ABNORMAL LOW (ref 39.0–52.0)
Hemoglobin: 13.3 g/dL (ref 13.0–17.0)
Hemoglobin: 12.6 g/dL — ABNORMAL LOW (ref 13.0–17.0)
Hemoglobin: 10.2 g/dL — ABNORMAL LOW (ref 13.0–17.0)
Hemoglobin: 10.2 g/dL — ABNORMAL LOW (ref 13.0–17.0)
Hemoglobin: 9.9 g/dL — ABNORMAL LOW (ref 13.0–17.0)
Potassium: 3.6 mmol/L (ref 3.5–5.1)
Potassium: 3.9 mmol/L (ref 3.5–5.1)
Potassium: 3.9 mmol/L (ref 3.5–5.1)
Potassium: 3.5 mmol/L (ref 3.5–5.1)
Potassium: 3.3 mmol/L — ABNORMAL LOW (ref 3.5–5.1)
Sodium: 139 mmol/L (ref 135–145)
Sodium: 140 mmol/L (ref 135–145)
Sodium: 141 mmol/L (ref 135–145)
Sodium: 138 mmol/L (ref 135–145)
Sodium: 141 mmol/L (ref 135–145)
TCO2: 26 mmol/L (ref 22–32)
TCO2: 27 mmol/L (ref 22–32)
TCO2: 28 mmol/L (ref 22–32)
TCO2: 24 mmol/L (ref 22–32)
TCO2: 23 mmol/L (ref 22–32)

## 2020-01-29 LAB — GLUCOSE, CAPILLARY
Glucose-Capillary: 124 mg/dL — ABNORMAL HIGH (ref 70–99)
Glucose-Capillary: 129 mg/dL — ABNORMAL HIGH (ref 70–99)
Glucose-Capillary: 134 mg/dL — ABNORMAL HIGH (ref 70–99)
Glucose-Capillary: 140 mg/dL — ABNORMAL HIGH (ref 70–99)
Glucose-Capillary: 144 mg/dL — ABNORMAL HIGH (ref 70–99)
Glucose-Capillary: 154 mg/dL — ABNORMAL HIGH (ref 70–99)
Glucose-Capillary: 166 mg/dL — ABNORMAL HIGH (ref 70–99)
Glucose-Capillary: 254 mg/dL — ABNORMAL HIGH (ref 70–99)
Glucose-Capillary: 93 mg/dL (ref 70–99)

## 2020-01-29 LAB — ECHO INTRAOPERATIVE TEE
Height: 69.5 in
Weight: 2737.23 oz

## 2020-01-29 LAB — CBC
HCT: 30.8 % — ABNORMAL LOW (ref 39.0–52.0)
HCT: 30.3 % — ABNORMAL LOW (ref 39.0–52.0)
Hemoglobin: 10.4 g/dL — ABNORMAL LOW (ref 13.0–17.0)
Hemoglobin: 10.2 g/dL — ABNORMAL LOW (ref 13.0–17.0)
MCH: 29 pg (ref 26.0–34.0)
MCH: 28.8 pg (ref 26.0–34.0)
MCHC: 33.8 g/dL (ref 30.0–36.0)
MCHC: 33.7 g/dL (ref 30.0–36.0)
MCV: 85.8 fL (ref 80.0–100.0)
MCV: 85.6 fL (ref 80.0–100.0)
Platelets: 177 10*3/uL (ref 150–400)
Platelets: 184 10*3/uL (ref 150–400)
RBC: 3.59 MIL/uL — ABNORMAL LOW (ref 4.22–5.81)
RBC: 3.54 MIL/uL — ABNORMAL LOW (ref 4.22–5.81)
RDW: 13.8 % (ref 11.5–15.5)
RDW: 13.9 % (ref 11.5–15.5)
WBC: 14.7 10*3/uL — ABNORMAL HIGH (ref 4.0–10.5)
WBC: 11.9 10*3/uL — ABNORMAL HIGH (ref 4.0–10.5)
nRBC: 0 % (ref 0.0–0.2)
nRBC: 0 % (ref 0.0–0.2)

## 2020-01-29 LAB — APTT: aPTT: 27 seconds (ref 24–36)

## 2020-01-29 LAB — PROTIME-INR
INR: 1.5 — ABNORMAL HIGH (ref 0.8–1.2)
Prothrombin Time: 17.1 seconds — ABNORMAL HIGH (ref 11.4–15.2)

## 2020-01-29 LAB — PLATELET COUNT: Platelets: 199 10*3/uL (ref 150–400)

## 2020-01-29 LAB — ABO/RH: ABO/RH(D): A POS

## 2020-01-29 LAB — HEMOGLOBIN AND HEMATOCRIT, BLOOD
HCT: 29.8 % — ABNORMAL LOW (ref 39.0–52.0)
Hemoglobin: 10.3 g/dL — ABNORMAL LOW (ref 13.0–17.0)

## 2020-01-29 SURGERY — CORONARY ARTERY BYPASS GRAFTING (CABG)
Anesthesia: General | Site: Leg Upper

## 2020-01-29 MED ORDER — MILRINONE LACTATE IN DEXTROSE 20-5 MG/100ML-% IV SOLN
0.1250 ug/kg/min | INTRAVENOUS | Status: DC
  Administered 2020-01-30: 0.25 ug/kg/min via INTRAVENOUS
  Administered 2020-01-31: 0.125 ug/kg/min via INTRAVENOUS
  Filled 2020-01-29: qty 100

## 2020-01-29 MED ORDER — TRAZODONE HCL 150 MG PO TABS
75.0000 mg | ORAL_TABLET | Freq: Every day | ORAL | Status: DC
  Administered 2020-01-30 – 2020-02-02 (×4): 75 mg via ORAL
  Filled 2020-01-29: qty 2
  Filled 2020-01-29: qty 1
  Filled 2020-01-29: qty 2
  Filled 2020-01-29: qty 1
  Filled 2020-01-29: qty 2

## 2020-01-29 MED ORDER — MAGNESIUM SULFATE 4 GM/100ML IV SOLN
INTRAVENOUS | Status: AC
  Filled 2020-01-29: qty 100

## 2020-01-29 MED ORDER — DOCUSATE SODIUM 100 MG PO CAPS
200.0000 mg | ORAL_CAPSULE | Freq: Every day | ORAL | Status: DC
  Administered 2020-01-30 – 2020-02-02 (×4): 200 mg via ORAL
  Filled 2020-01-29 (×5): qty 2

## 2020-01-29 MED ORDER — SODIUM CHLORIDE 0.45 % IV SOLN
INTRAVENOUS | Status: DC | PRN
  Administered 2020-01-29: 10 mL/h via INTRAVENOUS

## 2020-01-29 MED ORDER — ARTIFICIAL TEARS OPHTHALMIC OINT
TOPICAL_OINTMENT | OPHTHALMIC | Status: AC
  Filled 2020-01-29: qty 3.5

## 2020-01-29 MED ORDER — FENTANYL CITRATE (PF) 250 MCG/5ML IJ SOLN
INTRAMUSCULAR | Status: AC
  Filled 2020-01-29: qty 25

## 2020-01-29 MED ORDER — POTASSIUM CHLORIDE 10 MEQ/50ML IV SOLN
10.0000 meq | INTRAVENOUS | Status: AC
  Administered 2020-01-29 (×3): 10 meq via INTRAVENOUS

## 2020-01-29 MED ORDER — BISACODYL 5 MG PO TBEC
10.0000 mg | DELAYED_RELEASE_TABLET | Freq: Every day | ORAL | Status: DC
  Administered 2020-01-30 – 2020-02-01 (×3): 10 mg via ORAL
  Filled 2020-01-29 (×4): qty 2

## 2020-01-29 MED ORDER — OXYCODONE HCL 5 MG PO TABS
5.0000 mg | ORAL_TABLET | ORAL | Status: DC | PRN
  Administered 2020-01-30: 5 mg via ORAL
  Filled 2020-01-29: qty 1

## 2020-01-29 MED ORDER — LACTATED RINGERS IV SOLN: INTRAVENOUS | Status: DC

## 2020-01-29 MED ORDER — CHLORHEXIDINE GLUCONATE 4 % EX LIQD: 30.0000 mL | CUTANEOUS | Status: DC

## 2020-01-29 MED ORDER — METOCLOPRAMIDE HCL 5 MG/ML IJ SOLN
10.0000 mg | Freq: Four times a day (QID) | INTRAMUSCULAR | Status: DC
  Administered 2020-01-29 – 2020-01-31 (×7): 10 mg via INTRAVENOUS
  Filled 2020-01-29 (×7): qty 2

## 2020-01-29 MED ORDER — DEXTROSE 50 % IV SOLN: 0.0000 mL | INTRAVENOUS | Status: DC | PRN

## 2020-01-29 MED ORDER — LACTATED RINGERS IV SOLN: INTRAVENOUS | Status: DC | PRN

## 2020-01-29 MED ORDER — PROPOFOL 10 MG/ML IV BOLUS
INTRAVENOUS | Status: DC | PRN
  Administered 2020-01-29: 50 mg via INTRAVENOUS

## 2020-01-29 MED ORDER — SODIUM CHLORIDE 0.9 % IV SOLN
1.5000 g | Freq: Two times a day (BID) | INTRAVENOUS | Status: AC
  Administered 2020-01-29 – 2020-01-31 (×4): 1.5 g via INTRAVENOUS
  Filled 2020-01-29 (×4): qty 1.5

## 2020-01-29 MED ORDER — PROTAMINE SULFATE 10 MG/ML IV SOLN
INTRAVENOUS | Status: AC
  Filled 2020-01-29: qty 25

## 2020-01-29 MED ORDER — CHLORHEXIDINE GLUCONATE 0.12 % MT SOLN
15.0000 mL | OROMUCOSAL | Status: AC
  Administered 2020-01-29: 15 mL via OROMUCOSAL

## 2020-01-29 MED ORDER — ALBUMIN HUMAN 5 % IV SOLN: INTRAVENOUS | Status: DC | PRN

## 2020-01-29 MED ORDER — PROTAMINE SULFATE 10 MG/ML IV SOLN
INTRAVENOUS | Status: AC
  Filled 2020-01-29: qty 5

## 2020-01-29 MED ORDER — ACETAMINOPHEN 650 MG RE SUPP
650.0000 mg | Freq: Once | RECTAL | Status: AC
  Administered 2020-01-29: 650 mg via RECTAL

## 2020-01-29 MED ORDER — METOPROLOL TARTRATE 12.5 MG HALF TABLET
ORAL_TABLET | ORAL | Status: AC
  Filled 2020-01-29: qty 1

## 2020-01-29 MED ORDER — METOPROLOL TARTRATE 12.5 MG HALF TABLET: 12.5000 mg | ORAL_TABLET | Freq: Once | ORAL | Status: DC

## 2020-01-29 MED ORDER — PHENYLEPHRINE 40 MCG/ML (10ML) SYRINGE FOR IV PUSH (FOR BLOOD PRESSURE SUPPORT)
PREFILLED_SYRINGE | INTRAVENOUS | Status: DC | PRN
  Administered 2020-01-29 (×4): 80 ug via INTRAVENOUS

## 2020-01-29 MED ORDER — INSULIN REGULAR(HUMAN) IN NACL 100-0.9 UT/100ML-% IV SOLN
INTRAVENOUS | Status: DC
  Administered 2020-01-30: 4 [IU]/h via INTRAVENOUS
  Filled 2020-01-29 (×2): qty 100

## 2020-01-29 MED ORDER — METOPROLOL TARTRATE 25 MG/10 ML ORAL SUSPENSION: 12.5000 mg | Freq: Two times a day (BID) | ORAL | Status: DC

## 2020-01-29 MED ORDER — ASPIRIN 81 MG PO CHEW: 324.0000 mg | CHEWABLE_TABLET | Freq: Every day | ORAL | Status: DC

## 2020-01-29 MED ORDER — SODIUM CHLORIDE 0.9% FLUSH: 3.0000 mL | INTRAVENOUS | Status: DC | PRN

## 2020-01-29 MED ORDER — ONDANSETRON HCL 4 MG/2ML IJ SOLN: 4.0000 mg | Freq: Four times a day (QID) | INTRAMUSCULAR | Status: DC | PRN

## 2020-01-29 MED ORDER — NITROGLYCERIN 0.2 MG/ML ON CALL CATH LAB
INTRAVENOUS | Status: DC | PRN
  Administered 2020-01-29: 20 ug via INTRAVENOUS
  Administered 2020-01-29: 40 ug via INTRAVENOUS

## 2020-01-29 MED ORDER — CHLORHEXIDINE GLUCONATE 0.12 % MT SOLN
OROMUCOSAL | Status: AC
  Administered 2020-01-29: 15 mL via OROMUCOSAL
  Filled 2020-01-29: qty 15

## 2020-01-29 MED ORDER — ALBUMIN HUMAN 5 % IV SOLN
250.0000 mL | INTRAVENOUS | Status: AC | PRN
  Administered 2020-01-29: 12.5 g via INTRAVENOUS

## 2020-01-29 MED ORDER — ACETAMINOPHEN 500 MG PO TABS
1000.0000 mg | ORAL_TABLET | Freq: Four times a day (QID) | ORAL | Status: DC
  Administered 2020-01-30 – 2020-02-02 (×13): 1000 mg via ORAL
  Filled 2020-01-29 (×15): qty 2

## 2020-01-29 MED ORDER — HEMOSTATIC AGENTS (NO CHARGE) OPTIME
TOPICAL | Status: DC | PRN
  Administered 2020-01-29 (×5): 1 via TOPICAL

## 2020-01-29 MED ORDER — PHENYLEPHRINE 40 MCG/ML (10ML) SYRINGE FOR IV PUSH (FOR BLOOD PRESSURE SUPPORT)
PREFILLED_SYRINGE | INTRAVENOUS | Status: AC
  Filled 2020-01-29: qty 10

## 2020-01-29 MED ORDER — PROTAMINE SULFATE 10 MG/ML IV SOLN
INTRAVENOUS | Status: DC | PRN
  Administered 2020-01-29 (×3): 50 mg via INTRAVENOUS
  Administered 2020-01-29: 30 mg via INTRAVENOUS
  Administered 2020-01-29 (×2): 50 mg via INTRAVENOUS

## 2020-01-29 MED ORDER — ROCURONIUM BROMIDE 10 MG/ML (PF) SYRINGE
PREFILLED_SYRINGE | INTRAVENOUS | Status: AC
  Filled 2020-01-29: qty 10

## 2020-01-29 MED ORDER — VANCOMYCIN HCL IN DEXTROSE 1-5 GM/200ML-% IV SOLN
1000.0000 mg | Freq: Once | INTRAVENOUS | Status: AC
  Administered 2020-01-29: 1000 mg via INTRAVENOUS
  Filled 2020-01-29: qty 200

## 2020-01-29 MED ORDER — TRAZODONE HCL 50 MG PO TABS: 75.0000 mg | ORAL_TABLET | Freq: Every day | ORAL | Status: DC

## 2020-01-29 MED ORDER — ROCURONIUM BROMIDE 10 MG/ML (PF) SYRINGE
PREFILLED_SYRINGE | INTRAVENOUS | Status: DC | PRN
  Administered 2020-01-29 (×2): 50 mg via INTRAVENOUS
  Administered 2020-01-29 (×2): 100 mg via INTRAVENOUS
  Administered 2020-01-29 (×2): 50 mg via INTRAVENOUS

## 2020-01-29 MED ORDER — MIDAZOLAM HCL (PF) 10 MG/2ML IJ SOLN
INTRAMUSCULAR | Status: AC
  Filled 2020-01-29: qty 2

## 2020-01-29 MED ORDER — POTASSIUM CHLORIDE 10 MEQ/50ML IV SOLN
10.0000 meq | INTRAVENOUS | Status: AC
  Administered 2020-01-30 (×3): 10 meq via INTRAVENOUS
  Filled 2020-01-29 (×3): qty 50

## 2020-01-29 MED ORDER — CHLORHEXIDINE GLUCONATE CLOTH 2 % EX PADS
6.0000 | MEDICATED_PAD | Freq: Every day | CUTANEOUS | Status: DC
  Administered 2020-01-30 – 2020-02-01 (×3): 6 via TOPICAL

## 2020-01-29 MED ORDER — ACETAMINOPHEN 160 MG/5ML PO SOLN: 1000.0000 mg | Freq: Four times a day (QID) | ORAL | Status: DC

## 2020-01-29 MED ORDER — 0.9 % SODIUM CHLORIDE (POUR BTL) OPTIME
TOPICAL | Status: DC | PRN
  Administered 2020-01-29: 5000 mL

## 2020-01-29 MED ORDER — PROPOFOL 10 MG/ML IV BOLUS
INTRAVENOUS | Status: AC
  Filled 2020-01-29: qty 20

## 2020-01-29 MED ORDER — FENOFIBRATE 160 MG PO TABS
160.0000 mg | ORAL_TABLET | Freq: Every day | ORAL | Status: DC
  Administered 2020-01-30 – 2020-02-03 (×5): 160 mg via ORAL
  Filled 2020-01-29 (×5): qty 1

## 2020-01-29 MED ORDER — ACETAMINOPHEN 160 MG/5ML PO SOLN: 650.0000 mg | Freq: Once | ORAL | Status: AC

## 2020-01-29 MED ORDER — TRAMADOL HCL 50 MG PO TABS
50.0000 mg | ORAL_TABLET | ORAL | Status: DC | PRN
  Administered 2020-01-29 – 2020-01-30 (×2): 50 mg via ORAL
  Administered 2020-01-31: 100 mg via ORAL
  Administered 2020-01-31: 50 mg via ORAL
  Filled 2020-01-29: qty 2
  Filled 2020-01-29 (×3): qty 1

## 2020-01-29 MED ORDER — BISACODYL 10 MG RE SUPP: 10.0000 mg | Freq: Every day | RECTAL | Status: DC

## 2020-01-29 MED ORDER — PRAVASTATIN SODIUM 10 MG PO TABS
20.0000 mg | ORAL_TABLET | Freq: Every day | ORAL | Status: DC
  Administered 2020-01-30 – 2020-02-03 (×5): 20 mg via ORAL
  Filled 2020-01-29 (×5): qty 2

## 2020-01-29 MED ORDER — CHLORHEXIDINE GLUCONATE 0.12 % MT SOLN: 15.0000 mL | Freq: Once | OROMUCOSAL | Status: AC

## 2020-01-29 MED ORDER — SUCCINYLCHOLINE CHLORIDE 200 MG/10ML IV SOSY
PREFILLED_SYRINGE | INTRAVENOUS | Status: AC
  Filled 2020-01-29: qty 10

## 2020-01-29 MED ORDER — SODIUM CHLORIDE 0.9 % IV SOLN
INTRAVENOUS | Status: DC
  Administered 2020-01-29: 10 mL/h via INTRAVENOUS

## 2020-01-29 MED ORDER — MIDAZOLAM HCL 2 MG/2ML IJ SOLN
2.0000 mg | INTRAMUSCULAR | Status: DC | PRN
  Administered 2020-01-29: 2 mg via INTRAVENOUS
  Filled 2020-01-29: qty 2

## 2020-01-29 MED ORDER — METOPROLOL TARTRATE 12.5 MG HALF TABLET
12.5000 mg | ORAL_TABLET | Freq: Two times a day (BID) | ORAL | Status: DC
  Filled 2020-01-29: qty 1

## 2020-01-29 MED ORDER — DEXMEDETOMIDINE HCL IN NACL 400 MCG/100ML IV SOLN: 0.0000 ug/kg/h | INTRAVENOUS | Status: DC

## 2020-01-29 MED ORDER — ARTIFICIAL TEARS OPHTHALMIC OINT
TOPICAL_OINTMENT | OPHTHALMIC | Status: DC | PRN
  Administered 2020-01-29: 1 via OPHTHALMIC

## 2020-01-29 MED ORDER — PHENYLEPHRINE HCL-NACL 10-0.9 MG/250ML-% IV SOLN
INTRAVENOUS | Status: DC | PRN
  Administered 2020-01-29: 25 ug/min via INTRAVENOUS

## 2020-01-29 MED ORDER — SODIUM CHLORIDE 0.9% FLUSH
3.0000 mL | Freq: Two times a day (BID) | INTRAVENOUS | Status: DC
  Administered 2020-01-30 – 2020-02-02 (×6): 3 mL via INTRAVENOUS

## 2020-01-29 MED ORDER — METOPROLOL TARTRATE 5 MG/5ML IV SOLN
2.5000 mg | INTRAVENOUS | Status: DC | PRN
  Administered 2020-01-30: 5 mg via INTRAVENOUS
  Administered 2020-01-31: 2.5 mg via INTRAVENOUS
  Administered 2020-01-31 (×2): 5 mg via INTRAVENOUS
  Administered 2020-02-01: 2.5 mg via INTRAVENOUS
  Administered 2020-02-02: 5 mg via INTRAVENOUS
  Filled 2020-01-29 (×6): qty 5

## 2020-01-29 MED ORDER — PANTOPRAZOLE SODIUM 40 MG PO TBEC
40.0000 mg | DELAYED_RELEASE_TABLET | Freq: Every day | ORAL | Status: DC
  Administered 2020-01-31 – 2020-02-03 (×4): 40 mg via ORAL
  Filled 2020-01-29 (×4): qty 1

## 2020-01-29 MED ORDER — SODIUM CHLORIDE 0.9 % IV SOLN: INTRAVENOUS | Status: DC | PRN

## 2020-01-29 MED ORDER — MAGNESIUM SULFATE 4 GM/100ML IV SOLN
4.0000 g | Freq: Once | INTRAVENOUS | Status: AC
  Administered 2020-01-29: 4 g via INTRAVENOUS

## 2020-01-29 MED ORDER — HEPARIN SODIUM (PORCINE) 1000 UNIT/ML IJ SOLN
INTRAMUSCULAR | Status: DC | PRN
  Administered 2020-01-29: 2000 [IU] via INTRAVENOUS
  Administered 2020-01-29: 26000 [IU] via INTRAVENOUS

## 2020-01-29 MED ORDER — ASPIRIN EC 325 MG PO TBEC
325.0000 mg | DELAYED_RELEASE_TABLET | Freq: Every day | ORAL | Status: DC
  Administered 2020-01-30: 325 mg via ORAL
  Filled 2020-01-29: qty 1

## 2020-01-29 MED ORDER — MORPHINE SULFATE (PF) 2 MG/ML IV SOLN
1.0000 mg | INTRAVENOUS | Status: DC | PRN
  Administered 2020-01-29 (×2): 2 mg via INTRAVENOUS
  Filled 2020-01-29 (×2): qty 1

## 2020-01-29 MED ORDER — FENTANYL CITRATE (PF) 250 MCG/5ML IJ SOLN
INTRAMUSCULAR | Status: DC | PRN
  Administered 2020-01-29: 100 ug via INTRAVENOUS
  Administered 2020-01-29: 250 ug via INTRAVENOUS
  Administered 2020-01-29: 25 ug via INTRAVENOUS
  Administered 2020-01-29: 50 ug via INTRAVENOUS
  Administered 2020-01-29: 150 ug via INTRAVENOUS
  Administered 2020-01-29: 25 ug via INTRAVENOUS
  Administered 2020-01-29: 150 ug via INTRAVENOUS
  Administered 2020-01-29 (×2): 100 ug via INTRAVENOUS
  Administered 2020-01-29: 50 ug via INTRAVENOUS
  Administered 2020-01-29: 250 ug via INTRAVENOUS

## 2020-01-29 MED ORDER — NITROGLYCERIN IN D5W 200-5 MCG/ML-% IV SOLN
0.0000 ug/min | INTRAVENOUS | Status: DC
  Administered 2020-01-30: 60 ug/min via INTRAVENOUS
  Filled 2020-01-29: qty 250

## 2020-01-29 MED ORDER — PLASMA-LYTE 148 IV SOLN
INTRAVENOUS | Status: DC | PRN
  Administered 2020-01-29: 500 mL via INTRAVASCULAR

## 2020-01-29 MED ORDER — SODIUM CHLORIDE 0.9 % IV SOLN: 250.0000 mL | INTRAVENOUS | Status: DC

## 2020-01-29 MED ORDER — FAMOTIDINE IN NACL 20-0.9 MG/50ML-% IV SOLN
20.0000 mg | Freq: Two times a day (BID) | INTRAVENOUS | Status: AC
  Administered 2020-01-29 (×2): 20 mg via INTRAVENOUS
  Filled 2020-01-29: qty 50

## 2020-01-29 MED ORDER — MIDAZOLAM HCL 5 MG/5ML IJ SOLN
INTRAMUSCULAR | Status: DC | PRN
  Administered 2020-01-29: 3 mg via INTRAVENOUS
  Administered 2020-01-29 (×3): 1 mg via INTRAVENOUS
  Administered 2020-01-29: 4 mg via INTRAVENOUS

## 2020-01-29 MED ORDER — PHENYLEPHRINE HCL-NACL 20-0.9 MG/250ML-% IV SOLN: 0.0000 ug/min | INTRAVENOUS | Status: DC

## 2020-01-29 MED ORDER — EPHEDRINE 5 MG/ML INJ
INTRAVENOUS | Status: AC
  Filled 2020-01-29: qty 10

## 2020-01-29 MED ORDER — ORAL CARE MOUTH RINSE
15.0000 mL | OROMUCOSAL | Status: DC
  Administered 2020-01-30 (×2): 15 mL via OROMUCOSAL

## 2020-01-29 MED ORDER — LACTATED RINGERS IV SOLN: 500.0000 mL | Freq: Once | INTRAVENOUS | Status: DC | PRN

## 2020-01-29 MED ORDER — HEPARIN SODIUM (PORCINE) 1000 UNIT/ML IJ SOLN
INTRAMUSCULAR | Status: AC
  Filled 2020-01-29: qty 1

## 2020-01-29 MED ORDER — CHLORHEXIDINE GLUCONATE 0.12% ORAL RINSE (MEDLINE KIT)
15.0000 mL | Freq: Two times a day (BID) | OROMUCOSAL | Status: DC
  Administered 2020-01-29: 15 mL via OROMUCOSAL

## 2020-01-29 SURGICAL SUPPLY — 108 items
ADAPTER CARDIO PERF ANTE/RETRO (ADAPTER) ×5 IMPLANT
ADH SKN CLS APL DERMABOND .7 (GAUZE/BANDAGES/DRESSINGS) ×6
ADPR PRFSN 84XANTGRD RTRGD (ADAPTER) ×3
AGENT HMST KT MTR STRL THRMB (HEMOSTASIS) ×3
BAG DECANTER FOR FLEXI CONT (MISCELLANEOUS) ×5 IMPLANT
BLADE CLIPPER SURG (BLADE) IMPLANT
BLADE STERNUM SYSTEM 6 (BLADE) ×5 IMPLANT
BLADE SURG 11 STRL SS (BLADE) ×5 IMPLANT
BLADE SURG 12 STRL SS (BLADE) ×5 IMPLANT
BNDG ELASTIC 4X5.8 VLCR STR LF (GAUZE/BANDAGES/DRESSINGS) ×10 IMPLANT
BNDG ELASTIC 6X5.8 VLCR STR LF (GAUZE/BANDAGES/DRESSINGS) ×10 IMPLANT
BNDG GAUZE ELAST 4 BULKY (GAUZE/BANDAGES/DRESSINGS) ×10 IMPLANT
CANISTER SUCT 3000ML PPV (MISCELLANEOUS) ×5 IMPLANT
CANNULA GUNDRY RCSP 15FR (MISCELLANEOUS) ×5 IMPLANT
CATH CPB KIT VANTRIGT (MISCELLANEOUS) ×5 IMPLANT
CATH ROBINSON RED A/P 18FR (CATHETERS) ×15 IMPLANT
CATH THORACIC 28FR RT ANG (CATHETERS) ×5 IMPLANT
CLIP FOGARTY SPRING 6M (CLIP) ×5 IMPLANT
DERMABOND ADVANCED (GAUZE/BANDAGES/DRESSINGS) ×4
DERMABOND ADVANCED .7 DNX12 (GAUZE/BANDAGES/DRESSINGS) ×6 IMPLANT
DRAIN CHANNEL 32F RND 10.7 FF (WOUND CARE) ×5 IMPLANT
DRAPE CARDIOVASCULAR INCISE (DRAPES) ×5
DRAPE SLUSH/WARMER DISC (DRAPES) ×5 IMPLANT
DRAPE SRG 135X102X78XABS (DRAPES) ×3 IMPLANT
DRSG AQUACEL AG ADV 3.5X14 (GAUZE/BANDAGES/DRESSINGS) ×5 IMPLANT
ELECT BLADE 4.0 EZ CLEAN MEGAD (MISCELLANEOUS) ×5
ELECT BLADE 6.5 EXT (BLADE) ×5 IMPLANT
ELECT CAUTERY BLADE 6.4 (BLADE) ×5 IMPLANT
ELECT REM PT RETURN 9FT ADLT (ELECTROSURGICAL) ×10
ELECTRODE BLDE 4.0 EZ CLN MEGD (MISCELLANEOUS) ×3 IMPLANT
ELECTRODE REM PT RTRN 9FT ADLT (ELECTROSURGICAL) ×6 IMPLANT
FELT TEFLON 1X6 (MISCELLANEOUS) ×5 IMPLANT
GAUZE SPONGE 4X4 12PLY STRL (GAUZE/BANDAGES/DRESSINGS) ×10 IMPLANT
GAUZE SPONGE 4X4 12PLY STRL LF (GAUZE/BANDAGES/DRESSINGS) ×15 IMPLANT
GLOVE BIO SURGEON STRL SZ 6 (GLOVE) ×5 IMPLANT
GLOVE BIO SURGEON STRL SZ 6.5 (GLOVE) ×20 IMPLANT
GLOVE BIO SURGEON STRL SZ7 (GLOVE) ×5 IMPLANT
GLOVE BIO SURGEON STRL SZ7.5 (GLOVE) ×15 IMPLANT
GLOVE BIO SURGEONS STRL SZ 6.5 (GLOVE) ×5
GLOVE BIOGEL PI IND STRL 6.5 (GLOVE) ×3 IMPLANT
GLOVE BIOGEL PI IND STRL 7.5 (GLOVE) ×6 IMPLANT
GLOVE BIOGEL PI INDICATOR 6.5 (GLOVE) ×2
GLOVE BIOGEL PI INDICATOR 7.5 (GLOVE) ×4
GLOVE SURG SS PI 7.0 STRL IVOR (GLOVE) ×10 IMPLANT
GLOVE SURG UNDER POLY LF SZ6 (GLOVE) ×15 IMPLANT
GOWN STRL REUS W/ TWL LRG LVL3 (GOWN DISPOSABLE) ×33 IMPLANT
GOWN STRL REUS W/TWL LRG LVL3 (GOWN DISPOSABLE) ×55
HEMOSTAT POWDER SURGIFOAM 1G (HEMOSTASIS) ×15 IMPLANT
HEMOSTAT SURGICEL 2X14 (HEMOSTASIS) ×5 IMPLANT
INSERT FOGARTY XLG (MISCELLANEOUS) IMPLANT
KIT BASIN OR (CUSTOM PROCEDURE TRAY) ×5 IMPLANT
KIT SUCTION CATH 14FR (SUCTIONS) ×5 IMPLANT
KIT TURNOVER KIT B (KITS) ×5 IMPLANT
KIT VASOVIEW HEMOPRO 2 VH 4000 (KITS) ×5 IMPLANT
LEAD PACING MYOCARDI (MISCELLANEOUS) ×5 IMPLANT
MARKER GRAFT CORONARY BYPASS (MISCELLANEOUS) ×20 IMPLANT
NS IRRIG 1000ML POUR BTL (IV SOLUTION) ×25 IMPLANT
PACK E OPEN HEART (SUTURE) ×5 IMPLANT
PACK OPEN HEART (CUSTOM PROCEDURE TRAY) ×5 IMPLANT
PAD ARMBOARD 7.5X6 YLW CONV (MISCELLANEOUS) ×10 IMPLANT
PAD ELECT DEFIB RADIOL ZOLL (MISCELLANEOUS) ×5 IMPLANT
PENCIL BUTTON HOLSTER BLD 10FT (ELECTRODE) ×5 IMPLANT
POSITIONER HEAD DONUT 9IN (MISCELLANEOUS) ×5 IMPLANT
PUNCH AORTIC ROTATE 4.5MM 8IN (MISCELLANEOUS) ×5 IMPLANT
SET CARDIOPLEGIA MPS 5001102 (MISCELLANEOUS) ×5 IMPLANT
SOL ANTI FOG 6CC (MISCELLANEOUS) ×3 IMPLANT
SOLUTION ANTI FOG 6CC (MISCELLANEOUS) ×2
SPONGE LAP 18X18 RF (DISPOSABLE) ×5 IMPLANT
SUPPORT HEART JANKE-BARRON (MISCELLANEOUS) ×5 IMPLANT
SURGIFLO W/THROMBIN 8M KIT (HEMOSTASIS) ×5 IMPLANT
SUT BONE WAX W31G (SUTURE) ×5 IMPLANT
SUT MNCRL AB 4-0 PS2 18 (SUTURE) ×5 IMPLANT
SUT PROLENE 3 0 SH DA (SUTURE) IMPLANT
SUT PROLENE 3 0 SH1 36 (SUTURE) IMPLANT
SUT PROLENE 4 0 RB 1 (SUTURE) ×5
SUT PROLENE 4 0 SH DA (SUTURE) ×5 IMPLANT
SUT PROLENE 4-0 RB1 .5 CRCL 36 (SUTURE) ×3 IMPLANT
SUT PROLENE 5 0 C 1 36 (SUTURE) IMPLANT
SUT PROLENE 6 0 C 1 30 (SUTURE) ×5 IMPLANT
SUT PROLENE 6 0 CC (SUTURE) ×30 IMPLANT
SUT PROLENE 8 0 BV175 6 (SUTURE) ×5 IMPLANT
SUT PROLENE BLUE 7 0 (SUTURE) ×5 IMPLANT
SUT PROLENE POLY MONO (SUTURE) ×5 IMPLANT
SUT SILK  1 MH (SUTURE)
SUT SILK 1 MH (SUTURE) IMPLANT
SUT SILK 2 0 SH CR/8 (SUTURE) ×5 IMPLANT
SUT SILK 3 0 SH CR/8 (SUTURE) ×5 IMPLANT
SUT STEEL 6MS V (SUTURE) ×5 IMPLANT
SUT STEEL SZ 6 DBL 3X14 BALL (SUTURE) ×10 IMPLANT
SUT VIC AB 1 CTX 36 (SUTURE) ×15
SUT VIC AB 1 CTX36XBRD ANBCTR (SUTURE) ×9 IMPLANT
SUT VIC AB 2-0 CT1 27 (SUTURE) ×5
SUT VIC AB 2-0 CT1 TAPERPNT 27 (SUTURE) ×3 IMPLANT
SUT VIC AB 2-0 CTX 27 (SUTURE) IMPLANT
SUT VIC AB 3-0 X1 27 (SUTURE) IMPLANT
SYR BULB IRRIG 60ML STRL (SYRINGE) ×10 IMPLANT
SYSTEM SAHARA CHEST DRAIN ATS (WOUND CARE) ×5 IMPLANT
TAPE CLOTH SURG 4X10 WHT LF (GAUZE/BANDAGES/DRESSINGS) ×5 IMPLANT
TAPE PAPER 2X10 WHT MICROPORE (GAUZE/BANDAGES/DRESSINGS) ×5 IMPLANT
TOWEL GREEN STERILE (TOWEL DISPOSABLE) ×5 IMPLANT
TOWEL GREEN STERILE FF (TOWEL DISPOSABLE) IMPLANT
TRAY FOLEY SLVR 16FR TEMP STAT (SET/KITS/TRAYS/PACK) ×5 IMPLANT
TUBE CONNECTING 20'X1/4 (TUBING) ×1
TUBE CONNECTING 20X1/4 (TUBING) ×4 IMPLANT
TUBE SUCT INTRACARD DLP 20F (MISCELLANEOUS) ×5 IMPLANT
TUBING LAP HI FLOW INSUFFLATIO (TUBING) ×5 IMPLANT
UNDERPAD 30X36 HEAVY ABSORB (UNDERPADS AND DIAPERS) ×5 IMPLANT
WATER STERILE IRR 1000ML POUR (IV SOLUTION) ×10 IMPLANT

## 2020-01-29 NOTE — H&P (Signed)
PCP is Joaquim Namuncan, Graham S, MD Referring Provider is No ref. provider found  No chief complaint on file.   HPI: Patient examined, images of coronary arteriograms personally reviewed and discussed with patient and wife. Very nice 79 year old semiretired diabetic non-smoker with recent symptoms of exertional angina.  A cardiac CTA was significant for heavy calcification of the LAD and circumflex and subsequent cardiac catheterization demonstrated severe three-vessel coronary disease with chronic occlusion of LAD and preserved LV function and normal LVEDP.  The patient was recommended for CABG.  The patient has had no angina since his cardiac catheterization 5 days ago.  He was loaded with Plavix prior to the cardiac cath.  Since the cardiac cath he has been on aspirin only and Plavix has been discontinued.  The patient has hypertension and type 2 diabetes as risk factors.  No family history.  No problems with general anesthesia in the past for lumbar laminectomy and knee surgery.  He is allergic to penicillin.  Past Medical History:  Diagnosis Date  . Coronary artery disease   . Diabetes mellitus without complication (HCC)   . Hyperlipidemia   . Hypertension   . Insomnia   . neuropathy    bilateral feet    Past Surgical History:  Procedure Laterality Date  . BACK SURGERY    . CARDIAC CATHETERIZATION     01/19/2020  . LAMINECTOMY  1993   L5-S1  . LEFT HEART CATH AND CORONARY ANGIOGRAPHY N/A 01/19/2020   Procedure: LEFT HEART CATH AND CORONARY ANGIOGRAPHY;  Surgeon: Iran OuchArida, Muhammad A, MD;  Location: [facility];  Service: Cardiovascular;  Laterality: N/A;  . MENISCUS REPAIR  1996  . TONSILLECTOMY      Family History  Problem Relation Age of Onset  . Hypertension Mother   . Cancer Neg Hx   . COPD Neg Hx   . Diabetes Neg Hx   . Heart disease Neg Hx   . Hyperlipidemia Neg Hx   . Stroke Neg Hx   . Colon cancer Neg Hx   . Prostate cancer Neg Hx     Social History Social  History   Tobacco Use  . Smoking status: Never Smoker  . Smokeless tobacco: Never Used  Vaping Use  . Vaping Use: Never used  Substance Use Topics  . Alcohol use: Not Currently    Comment: Very rare- occasionaly beer or wine   . Drug use: No    Current Facility-Administered Medications  Medication Dose Route Frequency Provider Last Rate Last Admin  . cefUROXime (ZINACEF) 1.5 g in sodium chloride 0.9 % 100 mL IVPB  1.5 g Intravenous To OR Donata ClayVan Trigt, Theron AristaPeter, MD      . cefUROXime (ZINACEF) 750 mg in sodium chloride 0.9 % 100 mL IVPB  750 mg Intravenous To OR Donata ClayVan Trigt, Theron AristaPeter, MD      . chlorhexidine (HIBICLENS) 4 % liquid 2 application  30 mL Topical UD Donata ClayVan Trigt, Theron AristaPeter, MD      . dexmedetomidine (PRECEDEX) 400 MCG/100ML (4 mcg/mL) infusion  0.1-0.7 mcg/kg/hr Intravenous To OR Donata ClayVan Trigt, Theron AristaPeter, MD      . EPINEPHrine (ADRENALIN) 4 mg in NS 250 mL (0.016 mg/mL) premix infusion  0-10 mcg/min Intravenous To OR Donata ClayVan Trigt, Theron AristaPeter, MD      . heparin 30,000 units/NS 1000 mL solution for CELLSAVER   Other To OR Donata ClayVan Trigt, Theron AristaPeter, MD      . heparin sodium (porcine) 2,500 Units, papaverine 30 mg in electrolyte-148 (PLASMALYTE-148) 500 mL irrigation  Irrigation To OR Donata Clay, Theron Arista, MD      . insulin regular, human (MYXREDLIN) 100 units/ 100 mL infusion   Intravenous To OR Donata Clay, Theron Arista, MD      . magnesium sulfate (IV Push/IM) injection 40 mEq  40 mEq Other To OR Donata Clay, Theron Arista, MD      . metoprolol tartrate (LOPRESSOR) tablet 12.5 mg  12.5 mg Oral Once Donata Clay, Theron Arista, MD      . milrinone (PRIMACOR) 20 MG/100 ML (0.2 mg/mL) infusion  0.3 mcg/kg/min Intravenous To OR Donata Clay, Theron Arista, MD      . nitroGLYCERIN 50 mg in dextrose 5 % 250 mL (0.2 mg/mL) infusion  2-200 mcg/min Intravenous To OR Donata Clay, Theron Arista, MD      . norepinephrine (LEVOPHED) 4mg  in premix infusion  0-40 mcg/min Intravenous To OR , Donata Clay, MD      . phenylephrine (NEOSYNEPHRINE) 20-0.9 MG/250ML-% infusion  30-200  mcg/min Intravenous To OR Theron Arista, Donata Clay, MD      . potassium chloride injection 80 mEq  80 mEq Other To OR Theron Arista, Donata Clay, MD      . tranexamic acid (CYKLOKAPRON) 2,500 mg in sodium chloride 0.9 % 250 mL (10 mg/mL) infusion  1.5 mg/kg/hr Intravenous To OR Theron Arista, Donata Clay, MD      . tranexamic acid (CYKLOKAPRON) bolus via infusion - over 30 minutes 1,164 mg  15 mg/kg Intravenous To OR Theron Arista, Donata Clay, MD      . tranexamic acid (CYKLOKAPRON) pump prime solution 155 mg  2 mg/kg Intracatheter To OR Theron Arista, Donata Clay, MD      . vancomycin Theron Arista) IVPB 1250 mg/250 mL  1,250 mg Intravenous To OR Luna Kitchens, Donata Clay, MD   1,250 mg at 01/29/20 0730   Facility-Administered Medications Ordered in Other Encounters  Medication Dose Route Frequency Provider Last Rate Last Admin  . fentaNYL citrate (PF) (SUBLIMAZE) injection   Intravenous Anesthesia Intra-op 01/31/20, CRNA   50 mcg at 01/29/20 [phone]  . lactated ringers infusion   Intravenous Continuous PRN 2353, CRNA   New Bag at 01/29/20 0630  . lactated ringers infusion   Intravenous Continuous PRN 01/31/20, CRNA   New Bag at 01/29/20 [phone]  . lactated ringers infusion   Intravenous Continuous PRN 6144, CRNA   New Bag at 01/29/20 [phone]  . midazolam (VERSED) 5 MG/5ML injection   Intravenous Anesthesia Intra-op 3154, CRNA   1 mg at 01/29/20 01/31/20    Allergies  Allergen Reactions  . Amoxil [Amoxicillin] Rash                      Review of Systems :  [ y ] = yes, [  ] = no Right-hand-dominant No history of thoracic trauma He has had Covid vaccines No symptoms of recent respiratory illness He walks about 3 miles 4 days a week.         General :  Weight gain [   ]    Weight loss  [   ]  Fatigue [  ]  Fever [  ]  Chills  [  ]                                          HEENT    Headache [  ]  Dizziness [  ]  Blurred vision [  ] Glaucoma  [  ]                          Nosebleeds [  ] Painful or loose teeth [   ]        Cardiac :  Chest pain/ pressure Cove.Etienne  ]  Resting SOB [  ] exertional SOB Cove.Etienne  ]                        Orthopnea [  ]  Pedal edema  [  ]  Palpitations [  ] Syncope/presyncope [ ]                         Paroxysmal nocturnal dyspnea [  ]         Pulmonary : cough [  ]  wheezing [  ]  Hemoptysis [  ] Sputum [  ] Snoring [  ]                              Pneumothorax [  ]  Sleep apnea [  ]        GI : Vomiting [  ]  Dysphagia [  ]  Melena  [  ]  Abdominal pain [  ] BRBPR [  ]              Heart burn [  ]  Constipation [  ] Diarrhea  [  ] Colonoscopy [   ]        GU : Hematuria [  ]  Dysuria [  ]  Nocturia [  ] UTI's [  ]        Vascular : Claudication [  ]  Rest pain [  ]  DVT [  ] Vein stripping [  ] leg ulcers [  ]                          TIA [  ] Stroke [  ]  Varicose veins [  ]        NEURO :  Headaches  [  ] Seizures [  ] Vision changes [  ] Paresthesias [  ]                                               Musculoskeletal :  Arthritis [  ] Gout  [  ]  Back pain [  ]  Joint pain [  ]        Skin :  Rash [  ]  Melanoma [  ] Sores [  ]        Heme : Bleeding problems [  ]Clotting Disorders [  ] Anemia [  ]Blood Transfusion [ ]         Endocrine : Diabetes [  y] Heat or Cold intolerance [  ] Polyuria [  ]excessive thirst [ ]         Psych : Depression [  ]  Anxiety [  ]  Psych hospitalizations [  ] Memory change [  ]  BP (!) 185/72   Pulse 63   Temp 98.3 F (36.8 C) (Oral)   Resp 17   Ht 5' 9.5" (1.765 m)   Wt 77.6 kg   SpO2 98%   BMI 24.90 kg/m  Physical Exam       Physical Exam  General: Alert well-developed well-nourished 79 year old male no acute distress HEENT: Normocephalic pupils equal , dentition adequate Neck: Supple without JVD, adenopathy, or bruit Chest: Clear to auscultation, symmetrical breath sounds, no rhonchi, no tenderness             or deformity Cardiovascular:  Regular rate and rhythm, no murmur, no gallop, peripheral pulses             palpable in all extremities Abdomen:  Soft, nontender, no palpable mass or organomegaly Extremities: Warm, well-perfused, no clubbing cyanosis edema or tenderness,              no venous stasis changes of the legs Rectal/GU: Deferred Neuro: Grossly non--focal and symmetrical throughout Skin: Clean and dry without rash or ulceration  Diagnostic Tests: Coronary xerograms reveal chronic occlusion of LAD which is reconstituted by collaterals.  Dominant RCA has significant proximal mid vessel CAD.  The circumflex has a large OM1 branch with a tight proximal stenosis and a smaller distal circumflex branch with a moderate stenosis.  Impression: Severe multivessel CAD with symptoms of exertional angina Patient has had no recurrent angina since the cardiac cath when he was placed on Imdur. Plan: I discussed the procedure of CABG in detail with the patient and his wife including the indications benefits alternatives and risk.  The surgical be scheduled for Monday, November 22 at [facility] to allow full Plavix washout.  He also has stopped taking his fish oil tablets.  Metformin will be held 48 hours prior to surgery.  Mikey Bussing, MD [facility]  Patient examined and no changes since office consult Consent for surgery documented  P Donata Clay MD

## 2020-01-29 NOTE — Telephone Encounter (Signed)
Pharmacy requests refill on: Lisinopril 20 mg  LAST REFILL: 01/24/2020 LAST OV: 01/11/2020 NEXT OV: 05/10/2020 PHARMACY: [id number] [place], [place]   Pharmacy requests refill on: Glimepiride 4 mg   LAST REFILL: 12/26/2019 LAST OV: 01/11/2020 NEXT OV: 05/10/2020 PHARMACY: [id number] [place], [place]  Pharmacy requests refill on: One Touch Ultra Blue Test Strip   LAST REFILL: 09/08/2019 (Has 12 refills ordered)  LAST OV: 01/11/2020 NEXT OV: 05/10/2020 PHARMACY: [id number] [place], [place]

## 2020-01-29 NOTE — Anesthesia Procedure Notes (Signed)
Arterial Line Insertion Start/End11/22/2021 6:50 AM, 01/29/2020 6:54 AM Performed by: Nils Pyle, CRNA, CRNA  Patient location: Pre-op. Preanesthetic checklist: patient identified, IV checked, site marked, risks and benefits discussed, surgical consent, monitors and equipment checked, pre-op evaluation and anesthesia consent Lidocaine 1% used for infiltration and patient sedated Left, radial was placed Catheter size: 20 G Hand hygiene performed  and maximum sterile barriers used  Allen's test indicative of satisfactory collateral circulation Attempts: 1 Procedure performed without using ultrasound guided technique. Ultrasound Notes:anatomy identified, needle tip was noted to be adjacent to the nerve/plexus identified and no ultrasound evidence of intravascular and/or intraneural injection Following insertion, dressing applied and Biopatch. Post procedure assessment: normal  Patient tolerated the procedure well with no immediate complications.

## 2020-01-29 NOTE — Anesthesia Procedure Notes (Signed)
Central Venous Catheter Insertion Performed by: Gaynelle Adu, MD, anesthesiologist Start/End11/22/2021 6:40 AM, 01/29/2020 6:55 AM Patient location: Pre-op. Preanesthetic checklist: patient identified, IV checked, site marked, risks and benefits discussed, surgical consent, monitors and equipment checked, pre-op evaluation, timeout performed and anesthesia consent Hand hygiene performed  and maximum sterile barriers used  PA cath was placed.Swan type:thermodilution PA Cath depth:50 Procedure performed without using ultrasound guided technique. Attempts: 1 Patient tolerated the procedure well with no immediate complications.

## 2020-01-29 NOTE — Progress Notes (Signed)
Patient ID: Luis Wise, male   DOB: 1940-05-18, 79 y.o.   MRN: [id number] EVENING ROUNDS NOTE :     [address]       [place] [address]             [phone]                 Day of Surgery Procedure(s) (LRB): CORONARY ARTERY BYPASS GRAFTING (CABG) TIMES FIVE USING LIMA to LAD; ENDOSCOPIC HARVESTED GREATER SAPHENOUS VEIN: SVG to PDA; SVG to sequenced OM1 & OM2. (N/A) TRANSESOPHAGEAL ECHOCARDIOGRAM (TEE) (N/A) ENDOVEIN HARVEST OF GREATER SAPHENOUS VEIN (Bilateral)  Total Length of Stay:  LOS: 0 days  BP 116/68   Pulse 89   Temp (!) 97.3 F (36.3 C)   Resp 19   Ht 5' 9.5" (1.765 m)   Wt 77.6 kg   SpO2 99%   BMI 24.90 kg/m   .Intake/Output      11/21 0701 - 11/22 0700 11/22 0701 - 11/23 0700   I.V. (mL/kg)  2078.8 (26.8)   Blood  363   IV Piggyback  1340.2   Total Intake(mL/kg)  3782.1 (48.7)   Urine (mL/kg/hr)  1990 (2.7)   Blood  558   Chest Tube  150   Total Output  2698   Net  +1084.1          . sodium chloride Stopped (01/29/20 1555)  . [START ON 01/30/2020] sodium chloride    . sodium chloride 10 mL/hr (01/29/20 1405)  . albumin human 12.5 g (01/29/20 1530)  . cefUROXime (ZINACEF)  IV    . dexmedetomidine (PRECEDEX) IV infusion 0.7 mcg/kg/hr (01/29/20 1439)  . famotidine (PEPCID) IV Stopped (01/29/20 1435)  . insulin 2.4 mL/hr at 01/29/20 1600  . lactated ringers    . lactated ringers Stopped (01/29/20 1345)  . lactated ringers 20 mL/hr at 01/29/20 1600  . magnesium sulfate    . magnesium sulfate 20 mL/hr at 01/29/20 1600  . milrinone 0.25 mcg/kg/min (01/29/20 1414)  . nitroGLYCERIN Stopped (01/29/20 1345)  . phenylephrine (NEO-SYNEPHRINE) Adult infusion Stopped (01/29/20 1345)  . potassium chloride 10 mEq (01/29/20 1555)  . vancomycin       Lab Results  Component Value Date   WBC 14.7 (H) 01/29/2020   HGB 10.4 (L) 01/29/2020   HCT 30.8 (L) 01/29/2020   PLT 177 01/29/2020   GLUCOSE 130 (H) 01/29/2020   CHOL 139 01/02/2020    TRIG 245.0 (H) 01/02/2020   HDL 25.10 (L) 01/02/2020   LDLDIRECT 81.0 01/02/2020   LDLCALC 77 06/09/2017   ALT 24 01/26/2020   AST 24 01/26/2020   NA 142 01/29/2020   K 3.3 (L) 01/29/2020   CL 103 01/29/2020   CREATININE 1.00 01/29/2020   BUN 17 01/29/2020   CO2 20 (L) 01/26/2020   TSH 0.993 06/09/2017   INR 1.5 (H) 01/29/2020   HGBA1C 7.0 (H) 01/26/2020   MICROALBUR 30 (H) 06/24/2018   On vent being weaned Total 300 from chest tubes On milrinone 0.25   Delight Ovens MD  Beeper [phone] Office [phone] 01/29/2020 4:25 PM

## 2020-01-29 NOTE — Brief Op Note (Signed)
01/29/2020  8:46 AM  PATIENT:  Luis Wise  78 y.o. male  PRE-OPERATIVE DIAGNOSIS:  CORANARY ARTERY DISEASE  POST-OPERATIVE DIAGNOSIS:  CORANARY ARTERY DISEASE  PROCEDURE:  Procedure(s):  CORONARY ARTERY BYPASS GRAFTING x 4 -LIMA to LAD -SVG to PDA -SEQ SVG to OM 1 and OM 2 -SVG to DIAGONAL  ENDOSCOPIC HARVEST GREATER SAPHENOUS VEIN -Right and Left Thigh ( 40 min/ 15 min)  TRANSESOPHAGEAL ECHOCARDIOGRAM (TEE) (N/A)  SURGEON:  Surgeon(s) and Role:    Kerin Perna, MD - Primary  PHYSICIAN ASSISTANT: Lowella Dandy PA-C  ANESTHESIA:   general  EBL:  558 mL   BLOOD ADMINISTERED: CELLSAVER  DRAINS:  Left Pleural Chest Drains, Mediastinal Chest Tube    LOCAL MEDICATIONS USED:  NONE  SPECIMEN:  No Specimen  DISPOSITION OF SPECIMEN:  N/A  COUNTS:  YES  TOURNIQUET:  * No tourniquets in log *  DICTATION: .Dragon Dictation  PLAN OF CARE: Admit to inpatient   PATIENT DISPOSITION:  ICU - intubated and hemodynamically stable.   Delay start of Pharmacological VTE agent (>24hrs) due to surgical blood loss or risk of bleeding: yes

## 2020-01-29 NOTE — Anesthesia Postprocedure Evaluation (Signed)
Anesthesia Post Note  Patient: Luis Wise  Procedure(s) Performed: CORONARY ARTERY BYPASS GRAFTING (CABG) TIMES FIVE USING LIMA to LAD; ENDOSCOPIC HARVESTED GREATER SAPHENOUS VEIN: SVG to PDA; SVG to sequenced OM1 & OM2. (N/A Chest) TRANSESOPHAGEAL ECHOCARDIOGRAM (TEE) (N/A ) ENDOVEIN HARVEST OF GREATER SAPHENOUS VEIN (Bilateral Leg Upper)     Patient location during evaluation: ICU Anesthesia Type: General Level of consciousness: sedated and patient remains intubated per anesthesia plan Pain management: pain level controlled Vital Signs Assessment: post-procedure vital signs reviewed and stable Respiratory status: patient remains intubated per anesthesia plan Cardiovascular status: stable Postop Assessment: no apparent nausea or vomiting Anesthetic complications: no   No complications documented.  Last Vitals:  Vitals:   01/29/20 0547 01/29/20 1336  BP: (!) 185/72   Pulse: 63   Resp: 17   Temp: 36.8 C   SpO2: 98% 98%    Last Pain:  Vitals:   01/29/20 0618  TempSrc:   PainSc: 0-No pain                 Beryle Lathe

## 2020-01-29 NOTE — Progress Notes (Signed)
Pre Procedure note for inpatients:   Luis Wise has been scheduled for Procedure(s): CORONARY ARTERY BYPASS GRAFTING (CABG) (N/A) TRANSESOPHAGEAL ECHOCARDIOGRAM (TEE) (N/A) today. The various methods of treatment have been discussed with the patient. After consideration of the risks, benefits and treatment options the patient has consented to the planned procedure.   The patient has been seen and labs reviewed. There are no changes in the patient's condition to prevent proceeding with the planned procedure today.  Recent labs:  Lab Results  Component Value Date   WBC 4.7 01/26/2020   HGB 14.1 01/26/2020   HCT 42.1 01/26/2020   PLT 277 01/26/2020   GLUCOSE 265 (H) 01/26/2020   CHOL 139 01/02/2020   TRIG 245.0 (H) 01/02/2020   HDL 25.10 (L) 01/02/2020   LDLDIRECT 81.0 01/02/2020   LDLCALC 77 06/09/2017   ALT 24 01/26/2020   AST 24 01/26/2020   NA 134 (L) 01/26/2020   K 3.9 01/26/2020   CL 101 01/26/2020   CREATININE 1.22 01/26/2020   BUN 23 01/26/2020   CO2 20 (L) 01/26/2020   TSH 0.993 06/09/2017   INR 1.0 01/26/2020   HGBA1C 7.0 (H) 01/26/2020   MICROALBUR 30 (H) 06/24/2018    Mikey Bussing, MD 01/29/2020 7:18 AM

## 2020-01-29 NOTE — Transfer of Care (Signed)
Immediate Anesthesia Transfer of Care Note  Patient: CHENG DEC  Procedure(s) Performed: CORONARY ARTERY BYPASS GRAFTING (CABG) TIMES FIVE USING LIMA to LAD; ENDOSCOPIC HARVESTED GREATER SAPHENOUS VEIN: SVG to PDA; SVG to sequenced OM1 & OM2. (N/A Chest) TRANSESOPHAGEAL ECHOCARDIOGRAM (TEE) (N/A ) ENDOVEIN HARVEST OF GREATER SAPHENOUS VEIN (Bilateral Leg Upper)  Patient Location: SICU  Anesthesia Type:General  Level of Consciousness: sedated, unresponsive and Patient remains intubated per anesthesia plan  Airway & Oxygen Therapy: Patient remains intubated per anesthesia plan and Patient placed on Ventilator (see vital sign flow sheet for setting)  Post-op Assessment: Report given to RN and Post -op Vital signs reviewed and stable  Post vital signs: Reviewed and stable  Last Vitals:  Vitals Value Taken Time  BP    Temp    Pulse    Resp    SpO2      Last Pain:  Vitals:   01/29/20 0618  TempSrc:   PainSc: 0-No pain      Patients Stated Pain Goal: 3 (01/29/20 0618)  Complications: No complications documented.

## 2020-01-29 NOTE — Anesthesia Procedure Notes (Signed)
Central Venous Catheter Insertion Performed by: Roderic Palau, MD, anesthesiologist Start/End11/22/2021 6:40 AM, 01/29/2020 6:55 AM Patient location: Pre-op. Preanesthetic checklist: patient identified, IV checked, site marked, risks and benefits discussed, surgical consent, monitors and equipment checked, pre-op evaluation, timeout performed and anesthesia consent Position: Trendelenburg Lidocaine 1% used for infiltration and patient sedated Hand hygiene performed , maximum sterile barriers used  and Seldinger technique used Catheter size: 9 Fr Total catheter length 10. Central line was placed.MAC introducer Procedure performed using ultrasound guided technique. Ultrasound Notes:anatomy identified, needle tip was noted to be adjacent to the nerve/plexus identified, no ultrasound evidence of intravascular and/or intraneural injection and image(s) printed for medical record Attempts: 1 Following insertion, line sutured, dressing applied and Biopatch. Post procedure assessment: blood return through all ports, free fluid flow and no air  Patient tolerated the procedure well with no immediate complications.

## 2020-01-29 NOTE — Anesthesia Procedure Notes (Signed)
Procedure Name: Intubation Date/Time: 01/29/2020 7:41 AM Performed by: Nils Pyle, CRNA Pre-anesthesia Checklist: Patient identified, Emergency Drugs available, Suction available and Patient being monitored Patient Re-evaluated:Patient Re-evaluated prior to induction Oxygen Delivery Method: Circle System Utilized Preoxygenation: Pre-oxygenation with 100% oxygen Induction Type: IV induction Ventilation: Mask ventilation without difficulty and Oral airway inserted - appropriate to patient size Laryngoscope Size: Hyacinth Meeker and 2 Grade View: Grade I Tube type: Oral Tube size: 8.0 mm Number of attempts: 1 Airway Equipment and Method: Stylet and Oral airway Placement Confirmation: ETT inserted through vocal cords under direct vision,  positive ETCO2 and breath sounds checked- equal and bilateral Secured at: 23 cm Tube secured with: Tape Dental Injury: Teeth and Oropharynx as per pre-operative assessment

## 2020-01-29 NOTE — Procedures (Signed)
Extubation Procedure Note  Patient Details:   Name: Luis Wise DOB: 11-11-1940 MRN: [id number]   Airway Documentation:    Vent end date: 01/29/20 Vent end time: 1645   Evaluation  O2 sats: stable throughout Complications: No apparent complications Patient did tolerate procedure well.     Yes   Patient extubated per rapid wean protocol. Positive cuff leak. NIF -26, VC 1.9L. Vitals are stable on 3L [place]. RN at bedside.  Idris Dun Jeneane Pieczynski 01/29/2020, 4:51 PM

## 2020-01-30 ENCOUNTER — Inpatient Hospital Stay (HOSPITAL_COMMUNITY): Payer: Medicare Other

## 2020-01-30 ENCOUNTER — Encounter (HOSPITAL_COMMUNITY): Payer: Self-pay | Admitting: Cardiothoracic Surgery

## 2020-01-30 LAB — CBC
HCT: 29.9 % — ABNORMAL LOW (ref 39.0–52.0)
HCT: 30.4 % — ABNORMAL LOW (ref 39.0–52.0)
Hemoglobin: 10.1 g/dL — ABNORMAL LOW (ref 13.0–17.0)
Hemoglobin: 10.1 g/dL — ABNORMAL LOW (ref 13.0–17.0)
MCH: 28.9 pg (ref 26.0–34.0)
MCH: 28.6 pg (ref 26.0–34.0)
MCHC: 33.8 g/dL (ref 30.0–36.0)
MCHC: 33.2 g/dL (ref 30.0–36.0)
MCV: 85.4 fL (ref 80.0–100.0)
MCV: 86.1 fL (ref 80.0–100.0)
Platelets: 176 10*3/uL (ref 150–400)
Platelets: 178 10*3/uL (ref 150–400)
RBC: 3.5 MIL/uL — ABNORMAL LOW (ref 4.22–5.81)
RBC: 3.53 MIL/uL — ABNORMAL LOW (ref 4.22–5.81)
RDW: 14.3 % (ref 11.5–15.5)
RDW: 14.6 % (ref 11.5–15.5)
WBC: 11.3 10*3/uL — ABNORMAL HIGH (ref 4.0–10.5)
WBC: 11 10*3/uL — ABNORMAL HIGH (ref 4.0–10.5)
nRBC: 0 % (ref 0.0–0.2)
nRBC: 0 % (ref 0.0–0.2)

## 2020-01-30 LAB — POCT I-STAT 7, (LYTES, BLD GAS, ICA,H+H)
Acid-Base Excess: 0 mmol/L (ref 0.0–2.0)
Acid-base deficit: 2 mmol/L (ref 0.0–2.0)
Acid-base deficit: 2 mmol/L (ref 0.0–2.0)
Bicarbonate: 25.5 mmol/L (ref 20.0–28.0)
Bicarbonate: 19.6 mmol/L — ABNORMAL LOW (ref 20.0–28.0)
Bicarbonate: 22.7 mmol/L (ref 20.0–28.0)
Calcium, Ion: 1.16 mmol/L (ref 1.15–1.40)
Calcium, Ion: 1.1 mmol/L — ABNORMAL LOW (ref 1.15–1.40)
Calcium, Ion: 1.17 mmol/L (ref 1.15–1.40)
HCT: 30 % — ABNORMAL LOW (ref 39.0–52.0)
HCT: 29 % — ABNORMAL LOW (ref 39.0–52.0)
HCT: 28 % — ABNORMAL LOW (ref 39.0–52.0)
Hemoglobin: 10.2 g/dL — ABNORMAL LOW (ref 13.0–17.0)
Hemoglobin: 9.9 g/dL — ABNORMAL LOW (ref 13.0–17.0)
Hemoglobin: 9.5 g/dL — ABNORMAL LOW (ref 13.0–17.0)
O2 Saturation: 99 %
O2 Saturation: 98 %
O2 Saturation: 90 %
Patient temperature: 36
Patient temperature: 36.8
Patient temperature: 37.4
Potassium: 3.6 mmol/L (ref 3.5–5.1)
Potassium: 3.6 mmol/L (ref 3.5–5.1)
Potassium: 3.8 mmol/L (ref 3.5–5.1)
Sodium: 143 mmol/L (ref 135–145)
Sodium: 142 mmol/L (ref 135–145)
Sodium: 142 mmol/L (ref 135–145)
TCO2: 27 mmol/L (ref 22–32)
TCO2: 20 mmol/L — ABNORMAL LOW (ref 22–32)
TCO2: 24 mmol/L (ref 22–32)
pCO2 arterial: 43.8 mmHg (ref 32.0–48.0)
pCO2 arterial: 22.8 mmHg — ABNORMAL LOW (ref 32.0–48.0)
pCO2 arterial: 38 mmHg (ref 32.0–48.0)
pH, Arterial: 7.369 (ref 7.350–7.450)
pH, Arterial: 7.541 — ABNORMAL HIGH (ref 7.350–7.450)
pH, Arterial: 7.386 (ref 7.350–7.450)
pO2, Arterial: 132 mmHg — ABNORMAL HIGH (ref 83.0–108.0)
pO2, Arterial: 92 mmHg (ref 83.0–108.0)
pO2, Arterial: 62 mmHg — ABNORMAL LOW (ref 83.0–108.0)

## 2020-01-30 LAB — GLUCOSE, CAPILLARY
Glucose-Capillary: 130 mg/dL — ABNORMAL HIGH (ref 70–99)
Glucose-Capillary: 124 mg/dL — ABNORMAL HIGH (ref 70–99)
Glucose-Capillary: 133 mg/dL — ABNORMAL HIGH (ref 70–99)
Glucose-Capillary: 149 mg/dL — ABNORMAL HIGH (ref 70–99)
Glucose-Capillary: 165 mg/dL — ABNORMAL HIGH (ref 70–99)
Glucose-Capillary: 151 mg/dL — ABNORMAL HIGH (ref 70–99)
Glucose-Capillary: 157 mg/dL — ABNORMAL HIGH (ref 70–99)
Glucose-Capillary: 146 mg/dL — ABNORMAL HIGH (ref 70–99)
Glucose-Capillary: 142 mg/dL — ABNORMAL HIGH (ref 70–99)
Glucose-Capillary: 232 mg/dL — ABNORMAL HIGH (ref 70–99)
Glucose-Capillary: 199 mg/dL — ABNORMAL HIGH (ref 70–99)

## 2020-01-30 LAB — BASIC METABOLIC PANEL
Anion gap: 9 (ref 5–15)
Anion gap: 9 (ref 5–15)
BUN: 14 mg/dL (ref 8–23)
BUN: 15 mg/dL (ref 8–23)
CO2: 22 mmol/L (ref 22–32)
CO2: 23 mmol/L (ref 22–32)
Calcium: 8.1 mg/dL — ABNORMAL LOW (ref 8.9–10.3)
Calcium: 8.5 mg/dL — ABNORMAL LOW (ref 8.9–10.3)
Chloride: 106 mmol/L (ref 98–111)
Chloride: 104 mmol/L (ref 98–111)
Creatinine, Ser: 0.9 mg/dL (ref 0.61–1.24)
Creatinine, Ser: 1.1 mg/dL (ref 0.61–1.24)
GFR, Estimated: >60 mL/min (ref >60)
GFR, Estimated: >60 mL/min (ref >60)
Glucose, Bld: 139 mg/dL — ABNORMAL HIGH (ref 70–99)
Glucose, Bld: 229 mg/dL — ABNORMAL HIGH (ref 70–99)
Potassium: 3.8 mmol/L (ref 3.5–5.1)
Potassium: 4 mmol/L (ref 3.5–5.1)
Sodium: 137 mmol/L (ref 135–145)
Sodium: 136 mmol/L (ref 135–145)

## 2020-01-30 LAB — MAGNESIUM
Magnesium: 2.2 mg/dL (ref 1.7–2.4)
Magnesium: 2.3 mg/dL (ref 1.7–2.4)

## 2020-01-30 MED ORDER — INSULIN ASPART 100 UNIT/ML ~~LOC~~ SOLN
4.0000 [IU] | Freq: Three times a day (TID) | SUBCUTANEOUS | Status: DC
  Administered 2020-01-30 – 2020-02-01 (×8): 4 [IU] via SUBCUTANEOUS

## 2020-01-30 MED ORDER — INSULIN ASPART 100 UNIT/ML ~~LOC~~ SOLN
0.0000 [IU] | SUBCUTANEOUS | Status: DC
  Administered 2020-01-30: 8 [IU] via SUBCUTANEOUS
  Administered 2020-01-30 (×2): 4 [IU] via SUBCUTANEOUS
  Administered 2020-01-31 (×2): 2 [IU] via SUBCUTANEOUS

## 2020-01-30 MED ORDER — METOPROLOL TARTRATE 25 MG/10 ML ORAL SUSPENSION
25.0000 mg | Freq: Two times a day (BID) | ORAL | Status: DC
  Filled 2020-01-30 (×2): qty 10

## 2020-01-30 MED ORDER — FE FUMARATE-B12-VIT C-FA-IFC PO CAPS
1.0000 | ORAL_CAPSULE | Freq: Two times a day (BID) | ORAL | Status: DC
  Administered 2020-01-30 – 2020-02-03 (×8): 1 via ORAL
  Filled 2020-01-30 (×9): qty 1

## 2020-01-30 MED ORDER — INSULIN DETEMIR 100 UNIT/ML ~~LOC~~ SOLN
24.0000 [IU] | Freq: Two times a day (BID) | SUBCUTANEOUS | Status: DC
  Administered 2020-01-30 – 2020-02-02 (×5): 24 [IU] via SUBCUTANEOUS
  Filled 2020-01-30 (×8): qty 0.24

## 2020-01-30 MED ORDER — FUROSEMIDE 10 MG/ML IJ SOLN
20.0000 mg | Freq: Two times a day (BID) | INTRAMUSCULAR | Status: DC
  Administered 2020-01-30 (×2): 20 mg via INTRAVENOUS
  Filled 2020-01-30 (×2): qty 2

## 2020-01-30 MED ORDER — METOPROLOL TARTRATE 25 MG PO TABS
25.0000 mg | ORAL_TABLET | Freq: Two times a day (BID) | ORAL | Status: DC
  Administered 2020-01-30 – 2020-02-01 (×6): 25 mg via ORAL
  Filled 2020-01-30 (×7): qty 1

## 2020-01-30 MED ORDER — ORAL CARE MOUTH RINSE
15.0000 mL | Freq: Two times a day (BID) | OROMUCOSAL | Status: DC
  Administered 2020-01-30 – 2020-02-03 (×5): 15 mL via OROMUCOSAL

## 2020-01-30 MED ORDER — LABETALOL HCL 5 MG/ML IV SOLN: 10.0000 mg | INTRAVENOUS | Status: DC | PRN

## 2020-01-30 MED ORDER — ASPIRIN 81 MG PO CHEW: 81.0000 mg | CHEWABLE_TABLET | Freq: Every day | ORAL | Status: DC

## 2020-01-30 MED ORDER — ASPIRIN EC 81 MG PO TBEC
81.0000 mg | DELAYED_RELEASE_TABLET | Freq: Every day | ORAL | Status: DC
  Administered 2020-01-31 – 2020-02-03 (×4): 81 mg via ORAL
  Filled 2020-01-30 (×4): qty 1

## 2020-01-30 MED ORDER — POTASSIUM CHLORIDE 10 MEQ/50ML IV SOLN
10.0000 meq | INTRAVENOUS | Status: AC
  Administered 2020-01-30 (×2): 10 meq via INTRAVENOUS
  Filled 2020-01-30 (×2): qty 50

## 2020-01-30 MED ORDER — INSULIN DETEMIR 100 UNIT/ML ~~LOC~~ SOLN
18.0000 [IU] | Freq: Two times a day (BID) | SUBCUTANEOUS | Status: DC
  Administered 2020-01-30: 18 [IU] via SUBCUTANEOUS
  Filled 2020-01-30 (×2): qty 0.18

## 2020-01-30 MED ORDER — MORPHINE SULFATE (PF) 2 MG/ML IV SOLN
1.0000 mg | INTRAVENOUS | Status: DC | PRN
  Administered 2020-01-31: 2 mg via INTRAVENOUS
  Filled 2020-01-30: qty 1

## 2020-01-30 MED ORDER — AMLODIPINE BESYLATE 5 MG PO TABS
5.0000 mg | ORAL_TABLET | Freq: Every day | ORAL | Status: DC
  Administered 2020-01-30: 5 mg via ORAL
  Filled 2020-01-30: qty 1

## 2020-01-30 MED ORDER — MIDAZOLAM HCL 2 MG/2ML IJ SOLN: 2.0000 mg | INTRAMUSCULAR | Status: AC | PRN

## 2020-01-30 MED FILL — Magnesium Sulfate Inj 50%: INTRAMUSCULAR | Qty: 10 | Status: AC

## 2020-01-30 MED FILL — Heparin Sodium (Porcine) Inj 1000 Unit/ML: INTRAMUSCULAR | Qty: 30 | Status: AC

## 2020-01-30 MED FILL — Potassium Chloride Inj 2 mEq/ML: INTRAVENOUS | Qty: 40 | Status: AC

## 2020-01-30 NOTE — Progress Notes (Signed)
EVENING ROUNDS NOTE :     [address]       Luis Wise [address]             [id number]                 1 Day Post-Op Procedure(s) (LRB): CORONARY ARTERY BYPASS GRAFTING (CABG) TIMES FIVE USING LIMA to LAD; ENDOSCOPIC HARVESTED GREATER SAPHENOUS VEIN: SVG to PDA; SVG to sequenced OM1 & OM2. (N/A) TRANSESOPHAGEAL ECHOCARDIOGRAM (TEE) (N/A) ENDOVEIN HARVEST OF GREATER SAPHENOUS VEIN (Bilateral)   Total Length of Stay:  LOS: 1 day  Events:   Doing well SS CT output     BP 132/77   Pulse 79   Temp 98.2 F (36.8 C)   Resp (!) 21   Ht 5' 9.5" (1.765 m)   Wt 77.6 kg   SpO2 94%   BMI 24.90 kg/m   PAP: (14-29)/(4-14) 20/9 CO:  [6.6 L/min-8 L/min] 6.6 L/min CI:  [3.4 L/min/m2-4.2 L/min/m2] 3.4 L/min/m2  FiO2 (%):  [40 %] 40 % Set Rate:  [4 bmp] 4 bmp  . sodium chloride Stopped (01/30/20 0002)  . sodium chloride    . sodium chloride 10 mL/hr (01/29/20 1405)  . cefUROXime (ZINACEF)  IV Stopped (01/30/20 0857)  . insulin Stopped (01/30/20 1243)  . lactated ringers Stopped (01/29/20 1345)  . lactated ringers 20 mL/hr at 01/30/20 1500  . milrinone 0.125 mcg/kg/min (01/30/20 1500)  . nitroGLYCERIN 50 mcg/min (01/30/20 1500)    I/O last 3 completed shifts: In: 4618.8 [I.V.:2514.9; Blood:363; IV Piggyback:1740.9] Out: 4558 [Urine:3270; Blood:558; Chest Tube:730]   CBC Latest Ref Rng & Units 01/30/2020 01/29/2020 01/29/2020  WBC 4.0 - 10.5 K/uL 11.3(H) 11.9(H) -  Hemoglobin 13.0 - 17.0 g/dL 10.1(L) 10.2(L) 9.5(L)  Hematocrit 39 - 52 % 29.9(L) 30.3(L) 28.0(L)  Platelets 150 - 400 K/uL 176 184 -    BMP Latest Ref Rng & Units 01/30/2020 01/29/2020 01/29/2020  Glucose 70 - 99 mg/dL 124(P) 809(X) -  BUN 8 - 23 mg/dL 14 14 -  Creatinine 8.33 - 1.24 mg/dL 8.25 0.53 -  BUN/Creat Ratio 10 - 24 - - -  Sodium 135 - 145 mmol/L 137 139 142  Potassium 3.5 - 5.1 mmol/L 3.8 3.5 3.8  Chloride 98 - 111 mmol/L 106 109 -  CO2 22 - 32 mmol/L 22 22 -  Calcium 8.9 -  10.3 mg/dL 8.1(L) 8.0(L) -    ABG    Component Value Date/Time   PHART 7.386 01/29/2020 1830   PCO2ART 38.0 01/29/2020 1830   PO2ART 62 (L) 01/29/2020 1830   HCO3 22.7 01/29/2020 1830   TCO2 24 01/29/2020 1830   ACIDBASEDEF 2.0 01/29/2020 1830   O2SAT 90.0 01/29/2020 1830       Luis Greathouse, MD 01/30/2020 3:48 PM

## 2020-01-30 NOTE — Op Note (Signed)
NAME: Luis Wise, Luis Wise MEDICAL RECORD YI:[id number] ACCOUNT [id number] DATE OF BIRTH:January 18, 1941 FACILITY: [facility] LOCATION: [facility] PHYSICIAN:Zylie Mumaw VAN TRIGT III, MD  OPERATIVE REPORT  DATE OF PROCEDURE:  01/29/2020  OPERATION: 1.  Coronary artery bypass grafting x5 (left internal mammary artery to LAD, saphenous vein graft to diagonal, sequential saphenous vein graft to OM1 and OM2, saphenous vein graft to posterior descending). 2.  Endoscopic harvest of bilateral greater saphenous vein.  SURGEON:  Mikey Bussing, MD  ASSISTANT:  Lowella Dandy, PA-C  ANESTHESIA:  General by Dr. Elijah Birk Brock_____.  PREOPERATIVE DIAGNOSES:  Severe 3-vessel coronary artery disease with accelerating angina and preserved LV systolic function.  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  CLINICAL NOTE:  The patient is a 79 year old diabetic with positive family history of coronary artery disease who presents with recent onset of exertional chest pain, increasing in frequency and severity.  A cardiac CT scan suggested calcified plaque in  the LAD and circumflex vessels.  A subsequent cardiac catheterization documented severe 3-vessel coronary artery disease with preserved LV systolic function.  He was felt to be a candidate for surgical coronary revascularization.  I saw the patient in  consultation in the office after reviewing the images of his cardiac cath.  I discussed the procedure of CABG as his best long-term therapy for his significant 3-vessel CAD.  I discussed the major aspects of the surgery including the use of general  anesthesia and cardiopulmonary bypass, the location of the surgical incisions, and the expected postoperative hospital recovery.  I discussed with the patient the risks to him of coronary bypass surgery including the risk of stroke, bleeding, blood  transfusion requirement, infection, organ failure,  postoperative pulmonary effusions, and death.  He demonstrated his understanding and agreed to proceed with surgery under what I felt was an informed consent.  OPERATIVE FINDINGS: 1.  Adequate conduit. 2.  Severely calcified coronary vessels, which were extensive and diffuse which increased the difficulty in creating the anastomosis.  No blood products required for the surgery.  Successful multivessel CABG with preservation of systolic LV function  after separation from cardiopulmonary bypass.  DESCRIPTION OF PROCEDURE:  The patient was brought from preoperative holding where informed consent had been documented and final issues addressed with the patient.  He was brought to the operating room and placed supine on the operating table.  General  anesthesia was induced under invasive hemodynamic monitoring.  He remained stable.  A transesophageal echo probe was placed by the anesthesia team.  The patient was prepped and draped as a sterile field and a proper time-out was performed.  A sternal incision was made as the saphenous vein was harvested bilaterally from each thigh using endoscopic vein harvest technique.  The left internal mammary artery was harvested as a pedicle graft from its origin at the subclavian vessels.  It was a  1.5 mm vessel with good flow.  The sternal retractor was placed, and the pericardium was suspended.  Pursestrings were placed in the ascending aorta and right atrium and after heparin was administered and the ACT was documented as being therapeutic, the  patient was cannulated and placed on bypass.  The coronaries were identified for grafting.  The LAD, diagonal, OM1, OM2, and posterior descending were all heavily calcified and stenotic vessels, but adequate targets.  The mammary artery and vein grafts  were prepared for the distal anastomoses and cardioplegia cannulas were placed for both antegrade and retrograde  cold blood cardioplegia.  The patient was cooled to 32 degrees,  and the aortic crossclamp was applied.  One liter of cold blood cardioplegia  was delivered in split doses between the antegrade aortic and retrograde coronary sinus catheters.  There was good cardioplegic arrest and supple temperature dropped less than 14 degrees.  Cardioplegia was delivered every 20 minutes.  The distal coronary anastomoses were performed.  The first distal anastomosis was the posterior descending.  This was extremely calcified and the anastomosis was placed distally.  There was a 1.5 mm vessel.  Reverse saphenous vein was sewn end-to-side  with running 7-0 Prolene with good flow through the graft.  Cardioplegia was redosed.  The second and third distal anastomoses consisted of the sequential vein graft to the OM1 and OM2.  Each had a proximal 95% stenosis.  Each was a 1.5 mm vessel.  First, the reverse saphenous vein was sewn side-to-side with running 7-0 Prolene to OM1.   The vein then continued in an end-to-side anastomosis to the OM2  There was good flow through the sequential graft and cardioplegia was redosed for the vein grafts.  The fourth distal anastomosis was to the main diagonal branch of the LAD, which was difficult to identify on the catheterization since there was chronic occlusion of the LAD.  The diagonal; however, was a 1.5 mm vessel and a reverse saphenous vein was  sewn end-to-side with running 7-0 Prolene.  There was good flow through the graft.  Cardioplegia was redosed.  Final and fifth distal anastomosis was the mid to distal third of the LAD.  It was totally occluded proximally.  The left IMA pedicle was brought through an opening in the pericardium and was brought down onto the LAD and sewn end-to-side to this 1.5 mm  vessel.  Running 8-0 Prolene was used for the anastomosis.  The bulldog was briefly released and there was a good flow through the anastomosis  The bulldog was reapplied to the pedicle and the pedicle was secured to the epicardium with 6-0  Prolenes.   Cardioplegia was redosed.  While the cross-clamp was still in place, 3 proximal vein anastomoses were performed on the ascending aorta using a 4.5 mm punch and running 6-0 Prolene.  Prior to tying down the final proximal anastomosis, air was vented from the coronaries with a dose  of retrograde warm blood cardioplegia.  The crossclamp was removed.  The heart was cardioverted back to a regular rhythm with 1 shock.  The vein grafts were de-aired and opened.  Each had good flow, and hemostasis was documented at the proximal and distal anastomoses.  The cardioplegia cannulas were removed.  The patient  was rewarmed and reperfused.  Temporary pacing wires were applied.  The lungs reexpanded and the ventilator was resumed.  The patient was then weaned from cardiopulmonary bypass without difficulty.  Echo showed preserved LV function.  Protamine was  administered without adverse reaction.  There was adequate hemostasis.  The superior pericardial fat was closed over the aorta.  The anterior mediastinum and bilateral pleural tubes were placed and brought out through separate incisions.  The sternum was  closed with wire.  The patient remained stable.  The pectoralis fascia was closed with a running #1 Vicryl.  The subcutaneous layer was closed with a running 2-0 Vicryl and the skin was closed with a subcuticular Vicryl.  Sterile dressings were applied.   The patient was then transported back to the ICU in stable condition.  Total cardiopulmonary  bypass time was 140 minutes.  HN/NUANCE  D:01/29/2020 T:01/30/2020 JOB:[id number]

## 2020-01-30 NOTE — Discharge Instructions (Signed)

## 2020-01-30 NOTE — Plan of Care (Signed)

## 2020-01-30 NOTE — Hospital Course (Addendum)
History of Present Illness:  Mr. Fulco is a 79 yo white male with known history of Diabetes, Hyperlipidemia, HTN, Neuropathy, and CAD.  The patient developed complaints of exertional angina.  He initially presented to [facility] on 12/29/2019 with complaints of chest pain.  At that time the patient states he had been experiencing substernal intermittent chest tightness for the past 3 weeks.  This has been occurring when the patient walks.  The pain usually resolves with rest.  He denied nausea, diaphoresis, syncope, and palpitations.  EKG was unremarkable.  Troponin levels were normal.  He was discharged and instructed to follow up with Cardiology.  He was evaluated by Dr. Mariah Milling who performed an EKG which showed a new LBBB.  This was concerning and it was felt the patient should undergo cardiac CTA.  He was also started on Imdur for his angina and elevated BP.  CT scan was performed on 01/11/2020.  This showed evidence of multivessel CAD.  It was felt the patient would benefit from cardiac catheterization.  This was performed on 01/19/2020 and showed a preserved EF and multivessel CAD.  It was felt the patient would benefit from coronary bypass grafting procedure and he was referred to [facility] for evaluation.  He was evaluated by Dr. Donata Clay on 01/23/2020 at which time it was felt patient would best be treated with coronary bypass grafting.  This would need to be delayed several days due to recent administration of Plavix for his catheterization procedure.  The risks and the benefits of the procedure were explained to the patient and he was agreeable to proceed.   Hospital Course:  Mr. Plog presented to [facility] on 01/29/2020.  He was taken to the operating room and underwent CABG x 5 utilizing LIMA to LAD, SVG to Diagonal, Sequential SVG to OM 1 and OM 2, and SVG to PDA.  He also underwent endoscopic harvest of greater saphenous vein from his right and left thigh.  He  tolerated the procedure without difficulty and was taken to the SICU in stable condition.  The patient was extubated the evening of surgery.  During his stay in the SICU the patient was weaned off Milrinone as hemodynamics allowed.  His chest tubes and arterial lines were removed without difficulty on 01/31/2020.  The patient developed Atrial Fibrillation.  He was treated with Amiodarone bolus and drip.  He converted to NSR.  He was felt medically stable for transfer to the progressive care unit on 02/01/2020.  The patient developed episode of Atrial Fibrillation.  This was brief and he converted with his oral medications.  He was hypertensive and his Lopressor was increased to his home dose.  His pacing wires were removed without difficulty.  He is ambulating independently.  His surgical incisions are healing without evidence of infection.  He is medically stable for discharge home today.

## 2020-01-30 NOTE — Progress Notes (Signed)
1 Day Post-Op Procedure(s) (LRB): CORONARY ARTERY BYPASS GRAFTING (CABG) TIMES FIVE USING LIMA to LAD; ENDOSCOPIC HARVESTED GREATER SAPHENOUS VEIN: SVG to PDA; SVG to sequenced OM1 & OM2. (N/A) TRANSESOPHAGEAL ECHOCARDIOGRAM (TEE) (N/A) ENDOVEIN HARVEST OF GREATER SAPHENOUS VEIN (Bilateral) Subjective: Feels sore, fatigued  Objective: Vital signs in last 24 hours: Temp:  [96.8 F (36 C)-99.9 F (37.7 C)] 98.1 F (36.7 C) (11/23 0825) Pulse Rate:  [78-91] 78 (11/23 0825) Resp:  [12-23] 12 (11/23 0825) BP: (91-136)/(63-82) 133/76 (11/23 0800) SpO2:  [90 %-99 %] 95 % (11/23 0825) Arterial Line BP: (106-177)/(49-74) 163/58 (11/23 0825) FiO2 (%):  [40 %-50 %] 40 % (11/22 1550) Weight:  [77.6 kg] 77.6 kg (11/23 0630)  Hemodynamic parameters for last 24 hours: PAP: (14-29)/(4-14) 14/5 CO:  [5.6 L/min-8 L/min] 6.6 L/min CI:  [2.9 L/min/m2-4.2 L/min/m2] 3.4 L/min/m2  Intake/Output from previous day: 11/22 0701 - 11/23 0700 In: 4618.8 [I.V.:2514.9; Blood:363; IV Piggyback:1740.9] Out: 4558 [Urine:3270; Blood:558; Chest Tube:730] Intake/Output this shift: Total I/O In: -  Out: 32 [Urine:22; Chest Tube:10]       Exam    General- alert and comfortable    Neck- no JVD, no cervical adenopathy palpable, no carotid bruit   Lungs- clear without rales, wheezes   Cor- regular rate and rhythm, no murmur , gallop   Abdomen- soft, non-tender   Extremities - warm, non-tender, minimal edema   Neuro- oriented, appropriate, no focal weakness   Lab Results: Recent Labs    01/29/20 2041 01/30/20 0457  WBC 11.9* 11.3*  HGB 10.2* 10.1*  HCT 30.3* 29.9*  PLT 184 176   BMET:  Recent Labs    01/29/20 2041 01/30/20 0457  NA 139 137  K 3.5 3.8  CL 109 106  CO2 22 22  GLUCOSE 137* 139*  BUN 14 14  CREATININE 1.05 0.90  CALCIUM 8.0* 8.1*    PT/INR:  Recent Labs    01/29/20 1326  LABPROT 17.1*  INR 1.5*   ABG    Component Value Date/Time   PHART 7.386 01/29/2020 1830   HCO3  22.7 01/29/2020 1830   TCO2 24 01/29/2020 1830   ACIDBASEDEF 2.0 01/29/2020 1830   O2SAT 90.0 01/29/2020 1830   CBG (last 3)  Recent Labs    01/30/20 0504 01/30/20 0709 01/30/20 0814  GLUCAP 133* 149* 165*    Assessment/Plan: S/P Procedure(s) (LRB): CORONARY ARTERY BYPASS GRAFTING (CABG) TIMES FIVE USING LIMA to LAD; ENDOSCOPIC HARVESTED GREATER SAPHENOUS VEIN: SVG to PDA; SVG to sequenced OM1 & OM2. (N/A) TRANSESOPHAGEAL ECHOCARDIOGRAM (TEE) (N/A) ENDOVEIN HARVEST OF GREATER SAPHENOUS VEIN (Bilateral) Mobilize Diuresis Diabetes control See progression orders leave chest tubes and observe output   LOS: 1 day    Luis Wise 01/30/2020

## 2020-01-31 ENCOUNTER — Inpatient Hospital Stay (HOSPITAL_COMMUNITY): Payer: Medicare Other

## 2020-01-31 LAB — GLUCOSE, CAPILLARY
Glucose-Capillary: 124 mg/dL — ABNORMAL HIGH (ref 70–99)
Glucose-Capillary: 148 mg/dL — ABNORMAL HIGH (ref 70–99)
Glucose-Capillary: 161 mg/dL — ABNORMAL HIGH (ref 70–99)
Glucose-Capillary: 161 mg/dL — ABNORMAL HIGH (ref 70–99)
Glucose-Capillary: 198 mg/dL — ABNORMAL HIGH (ref 70–99)
Glucose-Capillary: 270 mg/dL — ABNORMAL HIGH (ref 70–99)
Glucose-Capillary: 398 mg/dL — ABNORMAL HIGH (ref 70–99)

## 2020-01-31 LAB — COOXEMETRY PANEL
Carboxyhemoglobin: 1.3 % (ref 0.5–1.5)
Methemoglobin: 0.9 % (ref 0.0–1.5)
O2 Saturation: 73.7 %
Total hemoglobin: 10.6 g/dL — ABNORMAL LOW (ref 12.0–16.0)

## 2020-01-31 LAB — CBC
HCT: 31 % — ABNORMAL LOW (ref 39.0–52.0)
Hemoglobin: 10.3 g/dL — ABNORMAL LOW (ref 13.0–17.0)
MCH: 28.4 pg (ref 26.0–34.0)
MCHC: 33.2 g/dL (ref 30.0–36.0)
MCV: 85.4 fL (ref 80.0–100.0)
Platelets: 168 10*3/uL (ref 150–400)
RBC: 3.63 MIL/uL — ABNORMAL LOW (ref 4.22–5.81)
RDW: 14.6 % (ref 11.5–15.5)
WBC: 11.4 10*3/uL — ABNORMAL HIGH (ref 4.0–10.5)
nRBC: 0 % (ref 0.0–0.2)

## 2020-01-31 LAB — BASIC METABOLIC PANEL
Anion gap: 9 (ref 5–15)
BUN: 11 mg/dL (ref 8–23)
CO2: 25 mmol/L (ref 22–32)
Calcium: 8.6 mg/dL — ABNORMAL LOW (ref 8.9–10.3)
Chloride: 102 mmol/L (ref 98–111)
Creatinine, Ser: 0.98 mg/dL (ref 0.61–1.24)
GFR, Estimated: >60 mL/min (ref >60)
Glucose, Bld: 133 mg/dL — ABNORMAL HIGH (ref 70–99)
Potassium: 3.5 mmol/L (ref 3.5–5.1)
Sodium: 136 mmol/L (ref 135–145)

## 2020-01-31 MED ORDER — AMIODARONE HCL IN DEXTROSE 360-4.14 MG/200ML-% IV SOLN
60.0000 mg/h | INTRAVENOUS | Status: AC
  Administered 2020-01-31 (×2): 60 mg/h via INTRAVENOUS
  Filled 2020-01-31: qty 200

## 2020-01-31 MED ORDER — AMIODARONE HCL IN DEXTROSE 360-4.14 MG/200ML-% IV SOLN
30.0000 mg/h | INTRAVENOUS | Status: DC
  Administered 2020-01-31 (×2): 30 mg/h via INTRAVENOUS
  Filled 2020-01-31 (×2): qty 200

## 2020-01-31 MED ORDER — FUROSEMIDE 40 MG PO TABS
40.0000 mg | ORAL_TABLET | Freq: Every day | ORAL | Status: DC
  Administered 2020-02-01 – 2020-02-03 (×3): 40 mg via ORAL
  Filled 2020-01-31 (×3): qty 1

## 2020-01-31 MED ORDER — LISINOPRIL 10 MG PO TABS
10.0000 mg | ORAL_TABLET | Freq: Every day | ORAL | Status: DC
  Administered 2020-01-31: 10 mg via ORAL
  Filled 2020-01-31: qty 1

## 2020-01-31 MED ORDER — POTASSIUM CHLORIDE CRYS ER 20 MEQ PO TBCR
20.0000 meq | EXTENDED_RELEASE_TABLET | ORAL | Status: DC
  Administered 2020-01-31: 20 meq via ORAL
  Filled 2020-01-31: qty 1

## 2020-01-31 MED ORDER — POTASSIUM CHLORIDE CRYS ER 20 MEQ PO TBCR
20.0000 meq | EXTENDED_RELEASE_TABLET | Freq: Once | ORAL | Status: AC
  Administered 2020-01-31: 20 meq via ORAL

## 2020-01-31 MED ORDER — AMIODARONE HCL IN DEXTROSE 360-4.14 MG/200ML-% IV SOLN
INTRAVENOUS | Status: AC
  Administered 2020-01-31: 60 mg/h
  Filled 2020-01-31: qty 200

## 2020-01-31 MED ORDER — AMLODIPINE BESYLATE 10 MG PO TABS
10.0000 mg | ORAL_TABLET | Freq: Every day | ORAL | Status: DC
  Administered 2020-01-31 – 2020-02-03 (×4): 10 mg via ORAL
  Filled 2020-01-31 (×4): qty 1

## 2020-01-31 MED ORDER — FUROSEMIDE 10 MG/ML IJ SOLN: 20.0000 mg | Freq: Every day | INTRAMUSCULAR | Status: DC

## 2020-01-31 MED ORDER — POTASSIUM CHLORIDE CRYS ER 20 MEQ PO TBCR: 20.0000 meq | EXTENDED_RELEASE_TABLET | ORAL | Status: DC

## 2020-01-31 MED ORDER — FUROSEMIDE 10 MG/ML IJ SOLN
20.0000 mg | Freq: Every day | INTRAMUSCULAR | Status: AC
  Administered 2020-01-31: 20 mg via INTRAVENOUS
  Filled 2020-01-31: qty 2

## 2020-01-31 MED ORDER — POTASSIUM CHLORIDE CRYS ER 20 MEQ PO TBCR
20.0000 meq | EXTENDED_RELEASE_TABLET | Freq: Two times a day (BID) | ORAL | Status: DC
  Administered 2020-01-31 – 2020-02-03 (×6): 20 meq via ORAL
  Filled 2020-01-31 (×7): qty 1

## 2020-01-31 MED ORDER — AMIODARONE LOAD VIA INFUSION
150.0000 mg | Freq: Once | INTRAVENOUS | Status: AC
  Administered 2020-01-31: 150 mg via INTRAVENOUS
  Filled 2020-01-31: qty 83.34

## 2020-01-31 MED ORDER — INSULIN ASPART 100 UNIT/ML ~~LOC~~ SOLN
0.0000 [IU] | Freq: Three times a day (TID) | SUBCUTANEOUS | Status: DC
  Administered 2020-01-31: 4 [IU] via SUBCUTANEOUS
  Administered 2020-01-31: 2 [IU] via SUBCUTANEOUS
  Administered 2020-01-31: 12 [IU] via SUBCUTANEOUS
  Administered 2020-01-31: 4 [IU] via SUBCUTANEOUS
  Administered 2020-02-01 (×2): 2 [IU] via SUBCUTANEOUS
  Administered 2020-02-01: 4 [IU] via SUBCUTANEOUS
  Administered 2020-02-02: 8 [IU] via SUBCUTANEOUS
  Administered 2020-02-02: 2 [IU] via SUBCUTANEOUS
  Administered 2020-02-03: 4 [IU] via SUBCUTANEOUS

## 2020-01-31 MED ORDER — POTASSIUM CHLORIDE 10 MEQ/50ML IV SOLN
10.0000 meq | INTRAVENOUS | Status: AC
  Administered 2020-01-31: 10 meq via INTRAVENOUS
  Filled 2020-01-31: qty 50

## 2020-01-31 NOTE — Progress Notes (Signed)
2 Days Post-Op Procedure(s) (LRB): CORONARY ARTERY BYPASS GRAFTING (CABG) TIMES FIVE USING LIMA to LAD; ENDOSCOPIC HARVESTED GREATER SAPHENOUS VEIN: SVG to PDA; SVG to sequenced OM1 & OM2. (N/A) TRANSESOPHAGEAL ECHOCARDIOGRAM (TEE) (N/A) ENDOVEIN HARVEST OF GREATER SAPHENOUS VEIN (Bilateral) Subjective: Feeling palpitations from afib Iv amiodarone started  Objective: Vital signs in last 24 hours: Temp:  [97.9 F (36.6 C)-98.9 F (37.2 C)] 98.6 F (37 C) (11/24 0407) Pulse Rate:  [76-144] 104 (11/24 0600) Cardiac Rhythm: Normal sinus rhythm (11/23 2000) Resp:  [9-23] 13 (11/24 0600) BP: (120-181)/(64-93) 144/93 (11/24 0600) SpO2:  [92 %-99 %] 94 % (11/24 0600) Arterial Line BP: (148-168)/(56-61) 148/61 (11/23 1100) Weight:  [76.6 kg] 76.6 kg (11/24 0500)  Hemodynamic parameters for last 24 hours: PAP: (14-22)/(4-10) 20/9 CO:  [6.6 L/min] 6.6 L/min CI:  [3.4 L/min/m2] 3.4 L/min/m2  Intake/Output from previous day: 11/23 0701 - 11/24 0700 In: 1538.5 [I.V.:1238.5; IV Piggyback:300] Out: 2830 [Urine:2150; Chest Tube:680] Intake/Output this shift: No intake/output data recorded.       Exam    General- alert and comfortable    Neck- no JVD, no cervical adenopathy palpable, no carotid bruit   Lungs- clear without rales, wheezes   Cor- regular rate and rhythm, no murmur , gallop   Abdomen- soft, non-tender   Extremities - warm, non-tender, minimal edema   Neuro- oriented, appropriate, no focal weakness   Lab Results: Recent Labs    01/30/20 1707 01/31/20 0347  WBC 11.0* 11.4*  HGB 10.1* 10.3*  HCT 30.4* 31.0*  PLT 178 168   BMET:  Recent Labs    01/30/20 1707 01/31/20 0347  NA 136 136  K 4.0 3.5  CL 104 102  CO2 23 25  GLUCOSE 229* 133*  BUN 15 11  CREATININE 1.10 0.98  CALCIUM 8.5* 8.6*    PT/INR:  Recent Labs    01/29/20 1326  LABPROT 17.1*  INR 1.5*   ABG    Component Value Date/Time   PHART 7.386 01/29/2020 1830   HCO3 22.7 01/29/2020 1830    TCO2 24 01/29/2020 1830   ACIDBASEDEF 2.0 01/29/2020 1830   O2SAT 73.7 01/31/2020 0359   CBG (last 3)  Recent Labs    01/31/20 0025 01/31/20 0357 01/31/20 0650  GLUCAP 148* 124* 161*    Assessment/Plan: S/P Procedure(s) (LRB): CORONARY ARTERY BYPASS GRAFTING (CABG) TIMES FIVE USING LIMA to LAD; ENDOSCOPIC HARVESTED GREATER SAPHENOUS VEIN: SVG to PDA; SVG to sequenced OM1 & OM2. (N/A) TRANSESOPHAGEAL ECHOCARDIOGRAM (TEE) (N/A) ENDOVEIN HARVEST OF GREATER SAPHENOUS VEIN (Bilateral) Mobilize Diuresis Diabetes control d/c tubes/lines will transfer to stepdown after conversion to nsr Cont iv amiodarone today - no anticoagulation  LOS: 2 days    Kathlee Nations Trigt III 01/31/2020

## 2020-01-31 NOTE — Progress Notes (Signed)
Patient ID: Luis Wise, male   DOB: 1940/12/26, 79 y.o.   MRN: [id number] TCTS Evening Rounds:  Hemodynamically stable. Back in sinus on IV amio.  Good urine output.  Glucose control has been ok today but up to 270 this pm. Getting 25 Levemir and SSI this pm.

## 2020-02-01 ENCOUNTER — Inpatient Hospital Stay (HOSPITAL_COMMUNITY): Payer: Medicare Other

## 2020-02-01 LAB — BASIC METABOLIC PANEL
Anion gap: 9 (ref 5–15)
BUN: 15 mg/dL (ref 8–23)
CO2: 25 mmol/L (ref 22–32)
Calcium: 8.8 mg/dL — ABNORMAL LOW (ref 8.9–10.3)
Chloride: 103 mmol/L (ref 98–111)
Creatinine, Ser: 0.88 mg/dL (ref 0.61–1.24)
GFR, Estimated: >60 mL/min (ref >60)
Glucose, Bld: 134 mg/dL — ABNORMAL HIGH (ref 70–99)
Potassium: 3.8 mmol/L (ref 3.5–5.1)
Sodium: 137 mmol/L (ref 135–145)

## 2020-02-01 LAB — CBC
HCT: 31.7 % — ABNORMAL LOW (ref 39.0–52.0)
Hemoglobin: 10.6 g/dL — ABNORMAL LOW (ref 13.0–17.0)
MCH: 28.2 pg (ref 26.0–34.0)
MCHC: 33.4 g/dL (ref 30.0–36.0)
MCV: 84.3 fL (ref 80.0–100.0)
Platelets: 181 10*3/uL (ref 150–400)
RBC: 3.76 MIL/uL — ABNORMAL LOW (ref 4.22–5.81)
RDW: 14.6 % (ref 11.5–15.5)
WBC: 11.7 10*3/uL — ABNORMAL HIGH (ref 4.0–10.5)
nRBC: 0 % (ref 0.0–0.2)

## 2020-02-01 LAB — GLUCOSE, CAPILLARY
Glucose-Capillary: 127 mg/dL — ABNORMAL HIGH (ref 70–99)
Glucose-Capillary: 197 mg/dL — ABNORMAL HIGH (ref 70–99)
Glucose-Capillary: 137 mg/dL — ABNORMAL HIGH (ref 70–99)
Glucose-Capillary: 189 mg/dL — ABNORMAL HIGH (ref 70–99)

## 2020-02-01 MED ORDER — LISINOPRIL 10 MG PO TABS
20.0000 mg | ORAL_TABLET | Freq: Every day | ORAL | Status: DC
  Administered 2020-02-01 – 2020-02-03 (×3): 20 mg via ORAL
  Filled 2020-02-01 (×3): qty 2
  Filled 2020-02-01: qty 1

## 2020-02-01 MED ORDER — POTASSIUM CHLORIDE CRYS ER 20 MEQ PO TBCR
20.0000 meq | EXTENDED_RELEASE_TABLET | Freq: Once | ORAL | Status: AC
  Administered 2020-02-01: 20 meq via ORAL
  Filled 2020-02-01: qty 1

## 2020-02-01 MED ORDER — AMIODARONE HCL 200 MG PO TABS
400.0000 mg | ORAL_TABLET | Freq: Two times a day (BID) | ORAL | Status: DC
  Administered 2020-02-01 – 2020-02-03 (×5): 400 mg via ORAL
  Filled 2020-02-01 (×5): qty 2

## 2020-02-01 MED ORDER — OXYCODONE HCL 5 MG PO TABS: 5.0000 mg | ORAL_TABLET | ORAL | Status: DC | PRN

## 2020-02-01 MED FILL — Heparin Sodium (Porcine) Inj 1000 Unit/ML: INTRAMUSCULAR | Qty: 10 | Status: AC

## 2020-02-01 MED FILL — Heparin Sodium (Porcine) Inj 1000 Unit/ML: INTRAMUSCULAR | Qty: 30 | Status: AC

## 2020-02-01 MED FILL — Albumin, Human Inj 5%: INTRAVENOUS | Qty: 250 | Status: AC

## 2020-02-01 MED FILL — Sodium Chloride IV Soln 0.9%: INTRAVENOUS | Qty: 4000 | Status: AC

## 2020-02-01 MED FILL — Electrolyte-R (PH 7.4) Solution: INTRAVENOUS | Qty: 3000 | Status: AC

## 2020-02-01 MED FILL — Lidocaine HCl Local Preservative Free (PF) Inj 1%: INTRAMUSCULAR | Qty: 2 | Status: AC

## 2020-02-01 MED FILL — Mannitol IV Soln 20%: INTRAVENOUS | Qty: 500 | Status: AC

## 2020-02-01 NOTE — Progress Notes (Signed)
3 Days Post-Op Procedure(s) (LRB): CORONARY ARTERY BYPASS GRAFTING (CABG) TIMES FIVE USING LIMA to LAD; ENDOSCOPIC HARVESTED GREATER SAPHENOUS VEIN: SVG to PDA; SVG to sequenced OM1 & OM2. (N/A) TRANSESOPHAGEAL ECHOCARDIOGRAM (TEE) (N/A) ENDOVEIN HARVEST OF GREATER SAPHENOUS VEIN (Bilateral) Walked in hall  nsr on iv amio Feels stronger  Objective: Vital signs in last 24 hours: Temp:  [98.1 F (36.7 C)-99.1 F (37.3 C)] 98.5 F (36.9 C) (11/25 0752) Pulse Rate:  [56-123] 92 (11/25 0700) Cardiac Rhythm: Normal sinus rhythm;Bundle branch block (11/25 0719) Resp:  [8-27] 17 (11/25 0700) BP: (116-170)/(63-135) 142/68 (11/25 0600) SpO2:  [95 %-98 %] 96 % (11/25 0700) Weight:  [75.8 kg] 75.8 kg (11/25 0500)  Hemodynamic parameters for last 24 hours:  stable  Intake/Output from previous day: 11/24 0701 - 11/25 0700 In: 1115.1 [P.O.:180; I.V.:785.2; IV Piggyback:149.9] Out: 3515 [Urine:2965; Chest Tube:550] Intake/Output this shift: No intake/output data recorded.       Exam    General- alert and comfortable    Neck- no JVD, no cervical adenopathy palpable, no carotid bruit   Lungs- clear without rales, wheezes   Cor- regular rate and rhythm, no murmur , gallop   Abdomen- soft, non-tender   Extremities - warm, non-tender, minimal edema   Neuro- oriented, appropriate, no focal weakness   Lab Results: Recent Labs    01/31/20 0347 02/01/20 0019  WBC 11.4* 11.7*  HGB 10.3* 10.6*  HCT 31.0* 31.7*  PLT 168 181   BMET:  Recent Labs    01/31/20 0347 02/01/20 0019  NA 136 137  K 3.5 3.8  CL 102 103  CO2 25 25  GLUCOSE 133* 134*  BUN 11 15  CREATININE 0.98 0.88  CALCIUM 8.6* 8.8*    PT/INR:  Recent Labs    01/29/20 1326  LABPROT 17.1*  INR 1.5*   ABG    Component Value Date/Time   PHART 7.386 01/29/2020 1830   HCO3 22.7 01/29/2020 1830   TCO2 24 01/29/2020 1830   ACIDBASEDEF 2.0 01/29/2020 1830   O2SAT 73.7 01/31/2020 0359   CBG (last 3)  Recent Labs     01/31/20 1948 01/31/20 2118 02/01/20 0638  GLUCAP 398* 161* 127*    Assessment/Plan: S/P Procedure(s) (LRB): CORONARY ARTERY BYPASS GRAFTING (CABG) TIMES FIVE USING LIMA to LAD; ENDOSCOPIC HARVESTED GREATER SAPHENOUS VEIN: SVG to PDA; SVG to sequenced OM1 & OM2. (N/A) TRANSESOPHAGEAL ECHOCARDIOGRAM (TEE) (N/A) ENDOVEIN HARVEST OF GREATER SAPHENOUS VEIN (Bilateral) Mobilize Diuresis Diabetes control Plan for transfer to step-down: see transfer orders transition to po amiodarone   LOS: 3 days    Luis Wise 02/01/2020

## 2020-02-01 NOTE — Progress Notes (Signed)
Pt. Walked x 2 with wife on room air tolerated well  (the whole ) unit

## 2020-02-02 DIAGNOSIS — I4891 Unspecified atrial fibrillation: Secondary | ICD-10-CM

## 2020-02-02 LAB — CBC
HCT: 31.5 % — ABNORMAL LOW (ref 39.0–52.0)
Hemoglobin: 10.7 g/dL — ABNORMAL LOW (ref 13.0–17.0)
MCH: 28.7 pg (ref 26.0–34.0)
MCHC: 34 g/dL (ref 30.0–36.0)
MCV: 84.5 fL (ref 80.0–100.0)
Platelets: 245 10*3/uL (ref 150–400)
RBC: 3.73 MIL/uL — ABNORMAL LOW (ref 4.22–5.81)
RDW: 14.6 % (ref 11.5–15.5)
WBC: 8.8 10*3/uL (ref 4.0–10.5)
nRBC: 0 % (ref 0.0–0.2)

## 2020-02-02 LAB — BASIC METABOLIC PANEL
Anion gap: 11 (ref 5–15)
BUN: 16 mg/dL (ref 8–23)
CO2: 23 mmol/L (ref 22–32)
Calcium: 8.9 mg/dL (ref 8.9–10.3)
Chloride: 104 mmol/L (ref 98–111)
Creatinine, Ser: 0.96 mg/dL (ref 0.61–1.24)
GFR, Estimated: >60 mL/min (ref >60)
Glucose, Bld: 129 mg/dL — ABNORMAL HIGH (ref 70–99)
Potassium: 3.4 mmol/L — ABNORMAL LOW (ref 3.5–5.1)
Sodium: 138 mmol/L (ref 135–145)

## 2020-02-02 LAB — GLUCOSE, CAPILLARY
Glucose-Capillary: 108 mg/dL — ABNORMAL HIGH (ref 70–99)
Glucose-Capillary: 165 mg/dL — ABNORMAL HIGH (ref 70–99)
Glucose-Capillary: 204 mg/dL — ABNORMAL HIGH (ref 70–99)
Glucose-Capillary: 151 mg/dL — ABNORMAL HIGH (ref 70–99)

## 2020-02-02 MED ORDER — METFORMIN HCL 500 MG PO TABS
500.0000 mg | ORAL_TABLET | Freq: Every day | ORAL | Status: DC
  Administered 2020-02-02 – 2020-02-03 (×2): 500 mg via ORAL
  Filled 2020-02-02 (×2): qty 1

## 2020-02-02 MED ORDER — METOPROLOL TARTRATE 25 MG/10 ML ORAL SUSPENSION
50.0000 mg | Freq: Two times a day (BID) | ORAL | Status: DC
  Filled 2020-02-02 (×4): qty 20

## 2020-02-02 MED ORDER — METOPROLOL TARTRATE 50 MG PO TABS
50.0000 mg | ORAL_TABLET | Freq: Two times a day (BID) | ORAL | Status: DC
  Administered 2020-02-02 – 2020-02-03 (×3): 50 mg via ORAL
  Filled 2020-02-02 (×3): qty 1

## 2020-02-02 MED ORDER — METFORMIN HCL 500 MG PO TABS
1000.0000 mg | ORAL_TABLET | Freq: Every evening | ORAL | Status: DC
  Administered 2020-02-02: 1000 mg via ORAL
  Filled 2020-02-02: qty 2

## 2020-02-02 MED ORDER — GLIMEPIRIDE 4 MG PO TABS
4.0000 mg | ORAL_TABLET | Freq: Every day | ORAL | Status: DC
  Administered 2020-02-03: 4 mg via ORAL
  Filled 2020-02-02 (×2): qty 1

## 2020-02-02 MED ORDER — ACETAMINOPHEN 500 MG PO TABS
1000.0000 mg | ORAL_TABLET | Freq: Four times a day (QID) | ORAL | 0 refills | Status: DC | PRN
Start: 2020-02-02 — End: 2020-03-11

## 2020-02-02 NOTE — Discharge Summary (Addendum)
[address]       [place] [address]             [phone]    Physician Discharge Summary  Patient ID: Luis Wise MRN: [id number] DOB/AGE: 77-Apr-1942 77 y.o.  Admit date: 01/29/2020 Discharge date: 02/03/2020  Admission Diagnoses:  Patient Active Problem List   Diagnosis Date Noted   Coronary atherosclerosis 01/23/2020   Healthcare maintenance 01/14/2020   Angina pectoris (HCC) 12/31/2019   Insomnia    Erectile dysfunction 01/02/2019   Type 2 diabetes mellitus without complication, without long-term current use of insulin (HCC) 06/27/2018   Advanced care planning/counseling discussion 06/16/2017   BPH (benign prostatic hyperplasia) 06/08/2016   Hyperlipemia 10/25/2015   Hypertension associated with diabetes (HCC) 10/25/2015   Discharge Diagnoses:  Patient Active Problem List   Diagnosis Date Noted   Atrial fibrillation (HCC) 02/02/2020   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   Coronary atherosclerosis 01/23/2020   Healthcare maintenance 01/14/2020   Angina pectoris (HCC) 12/31/2019   Insomnia    Erectile dysfunction 01/02/2019   Type 2 diabetes mellitus without complication, without long-term current use of insulin (HCC) 06/27/2018   Advanced care planning/counseling discussion 06/16/2017   BPH (benign prostatic hyperplasia) 06/08/2016   Hyperlipemia 10/25/2015   Hypertension associated with diabetes (HCC) 10/25/2015   Discharged Condition: good  History of Present Illness:  Luis Wise is a 79 yo white male with known history of Diabetes, Hyperlipidemia, HTN, Neuropathy, and CAD.  The patient developed complaints of exertional angina.  He initially presented to [facility] on 12/29/2019 with complaints of chest pain.  At that time the patient states he had been experiencing substernal intermittent chest tightness for the past 3 weeks.  This has been occurring when the patient walks.  The pain usually resolves with rest.  He denied nausea, diaphoresis, syncope,  and palpitations.  EKG was unremarkable.  Troponin levels were normal.  He was discharged and instructed to follow up with Cardiology.  He was evaluated by Dr. Mariah Milling who performed an EKG which showed a new LBBB.  This was concerning and it was felt the patient should undergo cardiac CTA.  He was also started on Imdur for his angina and elevated BP.  CT scan was performed on 01/11/2020.  This showed evidence of multivessel CAD.  It was felt the patient would benefit from cardiac catheterization.  This was performed on 01/19/2020 and showed a preserved EF and multivessel CAD.  It was felt the patient would benefit from coronary bypass grafting procedure and he was referred to [facility] for evaluation.  He was evaluated by Dr. Donata Clay on 01/23/2020 at which time it was felt patient would best be treated with coronary bypass grafting.  This would need to be delayed several days due to recent administration of Plavix for his catheterization procedure.  The risks and the benefits of the procedure were explained to the patient and he was agreeable to proceed.   Hospital Course:  Luis Wise presented to [facility] on 01/29/2020.  He was taken to the operating room and underwent CABG x 5 utilizing LIMA to LAD, SVG to Diagonal, Sequential SVG to OM 1 and OM 2, and SVG to PDA.  He also underwent endoscopic harvest of greater saphenous vein from his right and left thigh.  He tolerated the procedure without difficulty and was taken to the SICU in stable condition.  The patient was extubated the evening  of surgery.  During his stay in the SICU the patient was weaned off Milrinone as hemodynamics allowed.  His chest tubes and arterial lines were removed without difficulty on 01/31/2020.  The patient developed Atrial Fibrillation.  He was treated with Amiodarone bolus and drip.  He converted to NSR.  He was felt medically stable for transfer to the progressive care unit on 02/01/2020.  The  patient developed episode of Atrial Fibrillation.  This was brief and he converted with his oral medications.  He was hypertensive and his Lopressor was increased to his home dose.  His pacing wires were removed without difficulty.  He is ambulating independently.  His surgical incisions are healing without evidence of infection.  He is medically stable for discharge home today.  Significant Diagnostic Studies: angiography:   Prox RCA lesion is 60% stenosed. Dist RCA lesion is 50% stenosed. RPDA lesion is 50% stenosed. Mid RCA lesion is 70% stenosed. 2nd Mrg lesion is 80% stenosed. Dist Cx lesion is 80% stenosed. Prox LAD to Mid LAD lesion is 100% stenosed. 1st Diag lesion is 80% stenosed. Prox LAD lesion is 70% stenosed. The left ventricular systolic function is normal. LV end diastolic pressure is mildly elevated. The left ventricular ejection fraction is 55-65% by visual estimate.   1.  Severe heavily calcified three-vessel coronary artery disease.  Chronic occlusion of the proximal/mid LAD with bridging collaterals as well as faint right to left collaterals. 2.  Normal LV systolic function and mildly elevated left ventricular end-diastolic pressure at 13 mmHg.  Treatments: surgery:   1.  Coronary artery bypass grafting x5 (left internal mammary artery to LAD, saphenous vein graft to diagonal, sequential saphenous vein graft to OM1 and OM2, saphenous vein graft to posterior descending). 2.  Endoscopic harvest of bilateral greater saphenous vein.   Discharge Exam: Blood pressure (!) 163/82, pulse 82, temperature 99.3 F (37.4 C), temperature source Oral, resp. rate 16, height 5' 9.5" (1.765 m), weight 161 lb 3.2 oz (73.1 kg), SpO2 98 %.  General appearance: alert, cooperative and no distress Heart: regular rate and rhythm Lungs: clear to auscultation bilaterally Abdomen: soft, non-tender; bowel sounds normal; no masses,  no organomegaly Extremities: no edema. L hand mild area of  erythema where IV infiltrated Wound: clean and dry   Discharge Medications:  The patient has been discharged on:   1.Beta Blocker:  Yes [ X  ]                              No   [   ]                              If No, reason:  2.Ace Inhibitor/ARB: Yes [ X  ]                                     No  [    ]                                     If No, reason:  3.Statin:   Yes [ X  ]                  No  [   ]  If No, reason:  4.Ecasa:  Yes  [ X  ]                  No   [   ]                  If No, reason:    Discharge Instructions     Amb Referral to Cardiac Rehabilitation   Complete by: As directed    Diagnosis: CABG   CABG X ___: 5   After initial evaluation and assessments completed: Virtual Based Care may be provided alone or in conjunction with Phase 2 Cardiac Rehab based on patient barriers.: Yes      Allergies as of 02/03/2020       Reactions   Amoxil [amoxicillin] Rash        Medication List     STOP taking these medications    isosorbide mononitrate 30 MG 24 hr tablet Commonly known as: IMDUR       TAKE these medications    acetaminophen 500 MG tablet Commonly known as: TYLENOL Take 2 tablets (1,000 mg total) by mouth every 6 (six) hours as needed.   amiodarone 200 MG tablet Commonly known as: PACERONE Take 2 tablets (400 mg total) by mouth 2 (two) times daily. X 4 days, then decrease to 200 mg BID x 7 days, then decrease to 200 mg daily   amLODipine 10 MG tablet Commonly known as: NORVASC TAKE 1 TABLET BY MOUTH EVERY DAY   aspirin EC 81 MG tablet Take 1 tablet (81 mg total) by mouth daily. Swallow whole.   cholecalciferol 25 MCG (1000 UNIT) tablet Commonly known as: VITAMIN D3 Take 1,000 Units by mouth daily.   CoQ10 100 MG Caps Take 100 mg by mouth daily.   fenofibrate 160 MG tablet TAKE 1 TABLET BY MOUTH EVERY DAY   glimepiride 4 MG tablet Commonly known as: AMARYL TAKE 1 TABLET BY MOUTH EVERY DAY WITH  BREAKFAST What changed: See the new instructions.   lisinopril 20 MG tablet Commonly known as: ZESTRIL TAKE 1 TABLET BY MOUTH EVERY DAY What changed: how to take this   lisinopril-hydrochlorothiazide 20-25 MG tablet Commonly known as: ZESTORETIC TAKE 1 TABLET BY MOUTH EVERY DAY   metFORMIN 1000 MG tablet Commonly known as: GLUCOPHAGE 500mg  in the AM and 1000mg  in the PM What changed:  how much to take how to take this when to take this additional instructions   metoprolol tartrate 50 MG tablet Commonly known as: LOPRESSOR Take 1 tablet (50 mg total) by mouth 2 (two) times daily. What changed: when to take this   OneTouch Ultra test strip Generic drug: glucose blood TEST TWICE A DAY. MAX PER INS 1/DAY   potassium chloride SA 20 MEQ tablet Commonly known as: KLOR-CON Take 2 tablets (40 mEq total) by mouth daily. X 3 days   pravastatin 20 MG tablet Commonly known as: PRAVACHOL TAKE 1 TABLET BY MOUTH EVERY DAY   PRESERVISION AREDS 2 PO Take 1 tablet by mouth 2 (two) times daily.   Rybelsus 7 MG Tabs Generic drug: Semaglutide 1/2 tab daily for 1 months then 1 tab daily thereafter.  Take in the AM. What changed:  how much to take how to take this when to take this   traMADol 50 MG tablet Commonly known as: ULTRAM Take 1 tablet (50 mg total) by mouth every 4 (four) hours as needed for moderate pain.   traZODone 50 MG tablet Commonly known  as: DESYREL TAKE 1 TO 1.5 TABLETS BY MOUTH EVERY NIGHT FOR SLEEP What changed: See the new instructions.   URINOZINC PO Take 1 tablet by mouth in the morning and at bedtime.        Follow-up Information     Sondra Barges, PA-C Follow up on 02/15/2020.   Specialties: Physician Assistant, Cardiology, Radiology Why: Appointment is at 1:30 Contact information: [address] [place] [address] [phone]         Kerin Perna, MD Follow up on 02/28/2020.   Specialty: Cardiothoracic Surgery Why:  Appointment is at 12:30, please get CXR at 12:00 at [facility] located on first floor of our office building Contact information: [address] [place] [address] [phone]                 Signed: Lowella Dandy, PA-C 02/03/2020, 2:42 PM  patient examined and medical record reviewed,agree with above note. Kathlee Nations Trigt III 02/05/2020

## 2020-02-02 NOTE — Progress Notes (Signed)
CARDIAC REHAB PHASE I   PRE:  Rate/Rhythm: 130's    POST:  Rate/Rhythm: 150 afib  Walking to recliner [phone] Watched HR while I educated pt. Remained 120-130s afib. Pt requested to brush teeth. Assisted pt at sink and then walked to recliner with HR 140-150. RN in to get meds. Pt asymptomatic. Discussed staying in the tube and sternal precautions. Pt stated he is getting IS to . Encouraged 10x an hour. Gave heart healthy and diabetic diets. Gave walking instruction for ex. Discussed CRP 2 and will refer to [facility]. Pt stated wife is retired Charity fundraiser and son is Charity fundraiser. Left materials for them to read.  Pt left in recliner with call bell. Encouraged to walk with Mobility Specialist later if HR down.     Luetta Nutting, RN BSN  02/02/2020 9:07 AM

## 2020-02-02 NOTE — Progress Notes (Signed)
Mobility Specialist - Progress Note   02/02/20 1243  Mobility  Activity Ambulated in hall  Mobility performed by Family member   Pt seen ambulating in hallway w/ his wife.   Mamie Levers Mobility Specialist Mobility Specialist Phone: [phone]

## 2020-02-02 NOTE — Progress Notes (Signed)
      [address]       [place] [address]             [phone]      4 Days Post-Op Procedure(s) (LRB): CORONARY ARTERY BYPASS GRAFTING (CABG) TIMES FIVE USING LIMA to LAD; ENDOSCOPIC HARVESTED GREATER SAPHENOUS VEIN: SVG to PDA; SVG to sequenced OM1 & OM2. (N/A) TRANSESOPHAGEAL ECHOCARDIOGRAM (TEE) (N/A) ENDOVEIN HARVEST OF GREATER SAPHENOUS VEIN (Bilateral)   Subjective:  Patient feeling pretty good.  Has no specific complaints.  + ambulation  + BM  Objective: Vital signs in last 24 hours: Temp:  [98.5 F (36.9 C)-99.5 F (37.5 C)] 99.5 F (37.5 C) (11/25 2016) Pulse Rate:  [73-85] 80 (11/25 2016) Cardiac Rhythm: Normal sinus rhythm (11/25 1900) Resp:  [19-20] 20 (11/26 0425) BP: (146-169)/(72-80) 161/79 (11/25 2016) SpO2:  [94 %-98 %] 95 % (11/25 2016) Weight:  [73.5 kg-75.4 kg] 73.5 kg (11/26 0425)  Intake/Output from previous day: 11/25 0701 - 11/26 0700 In: 516.5 [P.O.:240; I.V.:276.5] Out: 610 [Urine:610]  General appearance: alert, cooperative and no distress Heart: regular rate and rhythm Lungs: clear to auscultation bilaterally Abdomen: soft, non-tender; bowel sounds normal; no masses,  no organomegaly Extremities: edema trace Wound: clean and dry  Lab Results: Recent Labs    02/01/20 0019 02/02/20 0120  WBC 11.7* 8.8  HGB 10.6* 10.7*  HCT 31.7* 31.5*  PLT 181 245   BMET:  Recent Labs    02/01/20 0019 02/02/20 0120  NA 137 138  K 3.8 3.4*  CL 103 104  CO2 25 23  GLUCOSE 134* 129*  BUN 15 16  CREATININE 0.88 0.96  CALCIUM 8.8* 8.9    PT/INR: No results for input(s): LABPROT, INR in the last 72 hours. ABG    Component Value Date/Time   PHART 7.386 01/29/2020 1830   HCO3 22.7 01/29/2020 1830   TCO2 24 01/29/2020 1830   ACIDBASEDEF 2.0 01/29/2020 1830   O2SAT 73.7 01/31/2020 0359   CBG (last 3)  Recent Labs    02/01/20 1708 02/01/20 2136 02/02/20 0609  GLUCAP 137* 189* 108*    Assessment/Plan: S/P  Procedure(s) (LRB): CORONARY ARTERY BYPASS GRAFTING (CABG) TIMES FIVE USING LIMA to LAD; ENDOSCOPIC HARVESTED GREATER SAPHENOUS VEIN: SVG to PDA; SVG to sequenced OM1 & OM2. (N/A) TRANSESOPHAGEAL ECHOCARDIOGRAM (TEE) (N/A) ENDOVEIN HARVEST OF GREATER SAPHENOUS VEIN (Bilateral)  1. CV- previous A. Fib, maintaining NSR, remains HTN- continue Amiodarone, Lopressor will increase to 50 mg BID, Norvasc, Lisinopril.. will d/c EPW today 2. Pulm- no acute issues, off oxygen, get CXR in AM 3. Renal- creatinine WNL, weight is below baseline, continue Lasix for now 4. DM- sugars controlled, will restart home Metformin, Amaryl, continue SSIP 5. Dispo- patient stable, will d/c EPW today, titrate Lopressor for additional BP control, restart home diabetic medications, if remains stable for d/c home in AM   LOS: 4 days    Lowella Dandy, PA-C 02/02/2020

## 2020-02-02 NOTE — Progress Notes (Signed)
Pt noted to be in afib with HR 130-160 this morning around 8:45.  Gave PO amio and metoprolol early.  PA Barrett made aware.  HR currently 110-130 with Pt comfortable in recliner.  Will monitor closely and re-eval need for further intervention at noon, per verbal from PA.

## 2020-02-02 NOTE — Progress Notes (Signed)
Pt ambulated entire hall x's 1 using RW, minimal assist to edge of bed and to standing.  HR in 90's for duration of walk.  To recliner after walk with call bell in reach and other comfort requests met.  Will cont plan of care.

## 2020-02-02 NOTE — Progress Notes (Signed)
Mobility Specialist - Progress Note   02/02/20 1122  Mobility  Activity Contraindicated/medical hold   Pt currently in Afib per RN, will f/u as able.   Mamie Levers Mobility Specialist Mobility Specialist Phone: [phone]

## 2020-02-02 NOTE — Progress Notes (Signed)
Pt back in NSR.  PA Barrett aware, coming to see Pt and wife.  Will con't plan of care.

## 2020-02-02 NOTE — Care Management Important Message (Signed)
Important Message  Patient Details  Name: Luis Wise MRN: [id number] Date of Birth: 1940-10-13   Medicare Important Message Given:  Yes     Renie Ora 02/02/2020, 8:27 AM

## 2020-02-03 LAB — BASIC METABOLIC PANEL
Anion gap: 13 (ref 5–15)
BUN: 20 mg/dL (ref 8–23)
CO2: 24 mmol/L (ref 22–32)
Calcium: 9.2 mg/dL (ref 8.9–10.3)
Chloride: 100 mmol/L (ref 98–111)
Creatinine, Ser: 1.08 mg/dL (ref 0.61–1.24)
GFR, Estimated: >60 mL/min (ref >60)
Glucose, Bld: 158 mg/dL — ABNORMAL HIGH (ref 70–99)
Potassium: 3.6 mmol/L (ref 3.5–5.1)
Sodium: 137 mmol/L (ref 135–145)

## 2020-02-03 LAB — CBC
HCT: 31.7 % — ABNORMAL LOW (ref 39.0–52.0)
Hemoglobin: 10.8 g/dL — ABNORMAL LOW (ref 13.0–17.0)
MCH: 28.7 pg (ref 26.0–34.0)
MCHC: 34.1 g/dL (ref 30.0–36.0)
MCV: 84.3 fL (ref 80.0–100.0)
Platelets: 273 10*3/uL (ref 150–400)
RBC: 3.76 MIL/uL — ABNORMAL LOW (ref 4.22–5.81)
RDW: 14.6 % (ref 11.5–15.5)
WBC: 9.5 10*3/uL (ref 4.0–10.5)
nRBC: 0 % (ref 0.0–0.2)

## 2020-02-03 LAB — GLUCOSE, CAPILLARY: Glucose-Capillary: 182 mg/dL — ABNORMAL HIGH (ref 70–99)

## 2020-02-03 MED ORDER — METOPROLOL TARTRATE 50 MG PO TABS
50.0000 mg | ORAL_TABLET | Freq: Two times a day (BID) | ORAL | Status: DC
Start: 2020-02-03 — End: 2020-02-15

## 2020-02-03 MED ORDER — AMIODARONE HCL 200 MG PO TABS
400.0000 mg | ORAL_TABLET | Freq: Two times a day (BID) | ORAL | 1 refills | Status: DC
Start: 2020-02-03 — End: 2020-02-15

## 2020-02-03 MED ORDER — TRAMADOL HCL 50 MG PO TABS: 50.0000 mg | ORAL_TABLET | ORAL | 0 refills | Status: DC | PRN

## 2020-02-03 MED ORDER — POTASSIUM CHLORIDE CRYS ER 20 MEQ PO TBCR
40.0000 meq | EXTENDED_RELEASE_TABLET | Freq: Every day | ORAL | 0 refills | Status: DC
Start: 2020-02-03 — End: 2020-02-06

## 2020-02-03 NOTE — Progress Notes (Signed)
Pt ambulated x 470 feet with front wheel walker, pt tolerated well  

## 2020-02-03 NOTE — Progress Notes (Addendum)
      [address]       [place] [address]             [phone]      5 Days Post-Op Procedure(s) (LRB): CORONARY ARTERY BYPASS GRAFTING (CABG) TIMES FIVE USING LIMA to LAD; ENDOSCOPIC HARVESTED GREATER SAPHENOUS VEIN: SVG to PDA; SVG to sequenced OM1 & OM2. (N/A) TRANSESOPHAGEAL ECHOCARDIOGRAM (TEE) (N/A) ENDOVEIN HARVEST OF GREATER SAPHENOUS VEIN (Bilateral)  Subjective:  No new complaints.  About to order breakfast.  Denies complains of chest pain and shortness of breath.  His IV infiltrated yesterday and had to be removed  Objective: Vital signs in last 24 hours: Temp:  [97.9 F (36.6 C)-98.7 F (37.1 C)] 98.4 F (36.9 C) (11/27 0314) Pulse Rate:  [74-85] 78 (11/27 0314) Cardiac Rhythm: Normal sinus rhythm;Bundle branch block (11/26 1943) Resp:  [16-20] 16 (11/27 0314) BP: (136-167)/(72-88) 151/80 (11/27 0314) SpO2:  [90 %-97 %] 90 % (11/26 2343) Weight:  [73.1 kg] 73.1 kg (11/27 0314)  Intake/Output from previous day: 11/26 0701 - 11/27 0700 In: 360 [P.O.:360] Out: 1210 [Urine:1210]  General appearance: alert, cooperative and no distress Heart: regular rate and rhythm Lungs: clear to auscultation bilaterally Abdomen: soft, non-tender; bowel sounds normal; no masses,  no organomegaly Extremities: no edema. L hand mild area of erythema where IV infiltrated Wound: clean and dry  Lab Results: Recent Labs    02/02/20 0120 02/03/20 0151  WBC 8.8 9.5  HGB 10.7* 10.8*  HCT 31.5* 31.7*  PLT 245 273   BMET:  Recent Labs    02/02/20 0120 02/03/20 0151  NA 138 137  K 3.4* 3.6  CL 104 100  CO2 23 24  GLUCOSE 129* 158*  BUN 16 20  CREATININE 0.96 1.08  CALCIUM 8.9 9.2    PT/INR: No results for input(s): LABPROT, INR in the last 72 hours. ABG    Component Value Date/Time   PHART 7.386 01/29/2020 1830   HCO3 22.7 01/29/2020 1830   TCO2 24 01/29/2020 1830   ACIDBASEDEF 2.0 01/29/2020 1830   O2SAT 73.7 01/31/2020 0359   CBG (last 3)    Recent Labs    02/02/20 1728 02/02/20 2132 02/03/20 0536  GLUCAP 204* 151* 182*    Assessment/Plan: S/P Procedure(s) (LRB): CORONARY ARTERY BYPASS GRAFTING (CABG) TIMES FIVE USING LIMA to LAD; ENDOSCOPIC HARVESTED GREATER SAPHENOUS VEIN: SVG to PDA; SVG to sequenced OM1 & OM2. (N/A) TRANSESOPHAGEAL ECHOCARDIOGRAM (TEE) (N/A) ENDOVEIN HARVEST OF GREATER SAPHENOUS VEIN (Bilateral)  1. CV- Brief Atrial Fibrillation yesterday, has been maintaining NSR since noon yesterday, he does have PVCs.. he remains on Lisinopril, Lopressor, Amiodarone, Norvasc... he remains HTN will resume home Lisinopril dosing of 40 mg daily 2. Pulm- no acute issues, continue IS 3. Renal- creatinine stable, K is at 3.6, will give a few days of supplement at discharge 4. DM- sugars mostly controlled, will resume all home agents at discharge 5. Dispo- patient stable, maintaining NSR since noon yesterday, will d/c EPW this morning, monitor patient for several hours if no further A. Fib or arrhythmia will d/c patient this afternoon   LOS: 5 days    Lowella Dandy, PA-C 02/03/2020

## 2020-02-03 NOTE — Progress Notes (Signed)
Mobility Specialist - Progress Note   02/03/20 1119  Mobility  Level of Assistance Independent  Assistive Device None  Distance Ambulated (ft) 500 ft  Mobility Response Tolerated well  Mobility performed by Mobility specialist  $Mobility charge 1 Mobility   Pt asking to ambulate once more prior to d/c. Asx throughout. Up in chair using IS after walk. HR remained in 70s during ambulation.   Mamie Levers Mobility Specialist Mobility Specialist Phone: [phone]

## 2020-02-03 NOTE — Progress Notes (Signed)
Pt ambulated with wife x 470 feet with front wheel walker

## 2020-02-03 NOTE — Progress Notes (Signed)
CARDIAC REHAB PHASE I   PRE:  Rate/Rhythm: 72 SR    BP: sitting 163/82    SaO2: 97 RA  MODE:  Ambulation: 470 ft   POST:  Rate/Rhythm: 78 SR    BP: sitting 144/78     SaO2: 98 RA  Tolerated well, no RW needed. Steady, no afib or c/o. Return to recliner. No questions regarding education. [id number]   Luis Wise CES, ACSM 02/03/2020 9:51 AM

## 2020-02-05 ENCOUNTER — Telehealth: Payer: Self-pay

## 2020-02-05 NOTE — Telephone Encounter (Signed)
[facility] Day - Client TELEPHONE ADVICE RECORD [facility] Patient Name: Luis Wise Gender: Male DOB: September 23, 1940 Age: 79 Y 11 M 20 D Return Phone Number: [phone] (Primary), [phone] (Secondary) Address[place] [address] Client [name] Day - Client Client Site [facility] - Day Physician Raechel Ache - MD Contact Type Call Who Is Calling Patient / Member / Family / Caregiver Call Type Triage / Clinical Caller Name Umair Rosiles Relationship To Patient Spouse Return Phone Number [phone] (Primary) Chief Complaint Blood Sugar High Reason for Call Symptomatic / Request for Health Information Initial Comment Caller states that her husband just had heart bypass surgery done this past week and his blood sugar has been as high as 264 which was the last blood sugar that was taken. His blood sugars in the hospital were running around 186-204 and he was given insulin during that stay as well. Translation No Nurse Assessment Nurse: Freida Busman, RN, Diane Date/Time Lamount Cohen Time): 02/05/2020 3:16:09 PM Confirm and document reason for call. If symptomatic, describe symptoms. ---Caller states that her husband just had heart bypass surgery done this past week and his blood sugar has been as high as 264 which was the last blood sugar that was taken. His blood sugars in the hospital were running around 186-204 and he was given insulin during that stay as well. Not on insulin at home. Pt. is on Metformin and another po med for BS. Pt. is a little weak. A1C 6.5-7. BS is usually below 200. Does the patient have any new or worsening symptoms? ---Yes Will a triage be completed? ---Yes Related visit to physician within the last 2 weeks? ---No Does the PT have any chronic conditions? (i.e. diabetes, asthma, this includes High risk factors for pregnancy, etc.) ---Yes List chronic conditions. ---DM, Bypass  surgery, Is this a behavioral health or substance abuse call? ---No Guidelines Guideline Title Affirmed Question Affirmed Notes Nurse Date/Time (Eastern Time) Diabetes - High Blood Sugar [1] Caller has NONURGENT medication or insulin pump question Freida Busman, RN, Diane 02/05/2020 3:19:41 PM PLEASE NOTE: All timestamps contained within this report are represented as [place] Standard Time. CONFIDENTIALTY NOTICE: This fax transmission is intended only for the addressee. It contains information that is legally privileged, confidential or otherwise protected from use or disclosure. If you are not the intended recipient, you are strictly prohibited from reviewing, disclosing, copying using or disseminating any of this information or taking any action in reliance on or regarding this information. If you have received this fax in error, please notify us immediately by telephone so that we can arrange for its return to [place]. Phone: [phone], Toll-Free: [phone], Fax: [phone] Page: 2 of 2 Call Id: [id number] Guidelines Guideline Title Affirmed Question Affirmed Notes Nurse Date/Time Lamount Cohen Time) AND [2] triager unable to answer question Disp. Time Lamount Cohen Time) Disposition Final User 02/05/2020 3:22:18 PM Call PCP within 24 Hours Yes Freida Busman, RN, Diane Caller Disagree/Comply Comply Caller Understands Yes PreDisposition Call Doctor Care Advice Given Per Guideline CALL PCP WITHIN 24 HOURS: * You need to discuss this with your doctor (or NP/PA) within the next 24 hours. CALL BACK IF: * You have more questions. CARE ADVICE given per Diabetes - High Blood Sugar (Adult) guideline. Referrals REFERRED TO PCP OFFICE

## 2020-02-06 ENCOUNTER — Encounter: Payer: Self-pay | Admitting: Family Medicine

## 2020-02-06 ENCOUNTER — Other Ambulatory Visit: Payer: Self-pay

## 2020-02-06 ENCOUNTER — Ambulatory Visit (INDEPENDENT_AMBULATORY_CARE_PROVIDER_SITE_OTHER): Payer: Medicare Other | Admitting: Family Medicine

## 2020-02-06 ENCOUNTER — Other Ambulatory Visit: Payer: Self-pay | Admitting: Family Medicine

## 2020-02-06 VITALS — BP 138/60 | HR 73 | Temp 97.5°F | Ht 69.5 in | Wt 169.4 lb

## 2020-02-06 DIAGNOSIS — E118 Type 2 diabetes mellitus with unspecified complications: Secondary | ICD-10-CM

## 2020-02-06 DIAGNOSIS — IMO0002 Reserved for concepts with insufficient information to code with codable children: Secondary | ICD-10-CM

## 2020-02-06 DIAGNOSIS — E1165 Type 2 diabetes mellitus with hyperglycemia: Secondary | ICD-10-CM | POA: Diagnosis not present

## 2020-02-06 DIAGNOSIS — Z951 Presence of aortocoronary bypass graft: Secondary | ICD-10-CM | POA: Diagnosis not present

## 2020-02-06 DIAGNOSIS — I208 Other forms of angina pectoris: Secondary | ICD-10-CM

## 2020-02-06 DIAGNOSIS — D649 Anemia, unspecified: Secondary | ICD-10-CM

## 2020-02-06 MED ORDER — FERROUS SULFATE 324 (65 FE) MG PO TBEC: 1.0000 | DELAYED_RELEASE_TABLET | ORAL | Status: DC

## 2020-02-06 MED ORDER — OZEMPIC (0.25 OR 0.5 MG/DOSE) 2 MG/1.5ML ~~LOC~~ SOPN: PEN_INJECTOR | SUBCUTANEOUS | 3 refills | Status: DC

## 2020-02-06 MED ORDER — FENOFIBRATE 160 MG PO TABS
160.0000 mg | ORAL_TABLET | Freq: Every day | ORAL | 1 refills | Status: DC
Start: 2020-02-06 — End: 2020-07-26

## 2020-02-06 MED ORDER — LISINOPRIL-HYDROCHLOROTHIAZIDE 20-25 MG PO TABS
1.0000 | ORAL_TABLET | Freq: Every day | ORAL | 1 refills | Status: DC
Start: 2020-02-06 — End: 2020-06-21

## 2020-02-06 MED ORDER — AMLODIPINE BESYLATE 10 MG PO TABS
10.0000 mg | ORAL_TABLET | Freq: Every day | ORAL | 1 refills | Status: DC
Start: 2020-02-06 — End: 2020-06-21

## 2020-02-06 NOTE — Telephone Encounter (Addendum)
See my chart message.  Patient has office visit scheduled for today.

## 2020-02-06 NOTE — Assessment & Plan Note (Signed)
Recovering remarkably well. 

## 2020-02-06 NOTE — Assessment & Plan Note (Addendum)
Elevated readings after recent CABG.  Will temporarily increase glimepiride to 8mg  with breakfast for the next week, have them continue keeping track with good sugar log as up to now.  Rybelsus dose just increased to 7mg  daily however unaffordable - they desire switch - will price out ozempic in its place. Start 0.25mg  weekly x2 wks then increase to 0.5mg  weekly.  Continue metformin 500/1000mg  daily to see if loose stools improve.  Discussed GLP1 RA like metformin is long acting medication. No personal or fmhx MTC. No nausea with rybelsus to date - anticipate should tolerate ozempic well.  Update if CBGs remain elevated despite above plan.

## 2020-02-06 NOTE — Assessment & Plan Note (Signed)
Likely blood loss anemia after recent CABG.  Ok to restart oral iron MWF at this time.

## 2020-02-06 NOTE — Patient Instructions (Addendum)
Sugars are staying too high. Continue careful diet as up to now.  Let's stop rybelsus. In its place, price out ozempic 0.25mg  weekly for 2 weeks then increase to 0.5mg  weekly.  For the next week, let's increase glimepiride to 2 tablets with breakfast (8mg  daily).  Return in 3-4 weeks for follow up visit with Dr (diabetes follow up).  Ok to restart iron tablets ferrous sulfate 325mg  (65 FE) MWF dosing.

## 2020-02-06 NOTE — Patient Outreach (Signed)
[facility]) Care Management  02/06/2020  PRANSHU LYSTER 11-19-1940 [id number]   Red Emmi: Date of Emmi call:  02/05/2020 Reason for alert:  Know who to call about changes in your condition?  ----no  Placed call to patient. Reviewed reason for call. Patient reports he does not know who to call. I reviewed with patient to call his PCP.  Reviewed with patient that he has already had his follow up with MD.  Patient also denies any current questions or concerns.   PLAN: case close as no additional needs identified.   Rowe Pavy, RN, BSN, CEN [facility] [employer] [phone]

## 2020-02-06 NOTE — Progress Notes (Addendum)
Patient ID: LAMOINE MAGALLON, male    DOB: 01-13-1941, 79 y.o.   MRN: [id number]  This visit was conducted in person.  BP 138/60 (BP Location: Left Arm, Patient Position: Sitting, Cuff Size: Normal)   Pulse 73   Temp (!) 97.5 F (36.4 C) (Temporal)   Ht 5' 9.5" (1.765 m)   Wt 169 lb 7 oz (76.9 kg)   SpO2 95%   BMI 24.66 kg/m    CC: hyperglycemia Subjective:   HPI: SHERMAINE BRIGHAM is a 79 y.o. male presenting on 02/06/2020 for Elevated Blood Sugar (C/o higher than normal since coming home on 02/03/20 from bypass surger.  BS has been in 200s.  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 Also had bilateral great saphenous veins harvested.   DM - chronically on metformin 500/1000mg  daily, glimepiride 4mg  with breakfast, and rybelsus 7mg  daily (started 01/08/2020 - 1/2 tab x 1 month then increase to whole tab - now on rybelsus 7mg  full tablet daily). cbg's have been running elevated since home from hospitalization - brings log showing am sugars 240-250, pm sugars 220-260. He was on SSI insulin during hospitalization, cbg's better controlled at that time. They have been very careful with diet since they've been home.  They actually find rybelsus very expensive and desire change.  No known steroid use while hospitalized.  Lab Results  Component Value Date   HGBA1C 7.0 (H) 01/26/2020     On fibrate and statin - refilled this morning.  Lab Results  Component Value Date   CHOL 139 01/02/2020   HDL 25.10 (L) 01/02/2020   LDLCALC 77 06/09/2017   LDLDIRECT 81.0 01/02/2020   TRIG 245.0 (H) 01/02/2020   CHOLHDL 6 01/02/2020       Relevant past medical, surgical, family and social history reviewed and updated as indicated. Interim medical history since  our last visit reviewed. Allergies and medications reviewed and updated. Outpatient Medications Prior to Visit  Medication Sig Dispense Refill  . acetaminophen (TYLENOL) 500 MG tablet Take 2 tablets (1,000 mg total) by mouth every 6 (six) hours as needed. 30 tablet 0  . amiodarone (PACERONE) 200 MG tablet Take 2 tablets (400 mg total) by mouth 2 (two) times daily. X 4 days, then decrease to 200 mg BID x 7 days, then decrease to 200 mg daily 90 tablet 1  . amLODipine (NORVASC) 10 MG tablet Take 1 tablet (10 mg total) by mouth daily. 90 tablet 1  . aspirin EC 81 MG tablet Take 1 tablet (81 mg total) by mouth daily. Swallow whole. 150 tablet 2  . cholecalciferol (VITAMIN D3) 25 MCG (1000 UNIT) tablet Take 1,000 Units by mouth daily.    . Coenzyme Q10 (COQ10) 100 MG CAPS Take 100 mg by mouth daily.     . fenofibrate 160 MG tablet Take 1 tablet (160 mg total) by mouth daily. 90 tablet 1  . glimepiride (AMARYL) 4 MG tablet TAKE 1 TABLET BY MOUTH EVERY DAY WITH BREAKFAST (Patient taking differently: Take 4 mg by mouth daily with breakfast. ) 90 tablet 1  . lisinopril (ZESTRIL) 20 MG tablet TAKE 1 TABLET BY MOUTH EVERY DAY (Patient taking differently: 20 mg daily. ) 90 tablet 1  . lisinopril-hydrochlorothiazide (  ZESTORETIC) 20-25 MG tablet Take 1 tablet by mouth daily. 90 tablet 1  . metFORMIN (GLUCOPHAGE) 1000 MG tablet 500mg  in the AM and 1000mg  in the PM (Patient taking differently: Take 1,000 mg by mouth 2 (two) times daily with a meal. 500mg  in the AM and 1000mg  in the P)    . metoprolol tartrate (LOPRESSOR) 50 MG tablet Take 1 tablet (50 mg total) by mouth 2 (two) times daily.    . Misc Natural Products (URINOZINC PO) Take 1 tablet by mouth in the morning and at bedtime.     . Multiple Vitamins-Minerals (PRESERVISION AREDS 2 PO) Take 1 tablet by mouth 2 (two) times daily.     ULTRA test strip TEST TWICE A DAY. MAX PER INS 1/DAY 100 strip 12  . pravastatin (PRAVACHOL) 20 MG tablet TAKE 1  TABLET BY MOUTH EVERY DAY (Patient taking differently: Take 20 mg by mouth daily. ) 90 tablet 2  . traMADol (ULTRAM) 50 MG tablet Take 1 tablet (50 mg total) by mouth every 4 (four) hours as needed for moderate pain. 30 tablet 0  . traZODone (DESYREL) 50 MG tablet TAKE 1 TO 1.5 TABLETS BY MOUTH EVERY NIGHT FOR SLEEP (Patient taking differently: Take 75 mg by mouth. ) 135 tablet 3  . Semaglutide (RYBELSUS) 7 MG TABS 1/2 tab daily for 1 months then 1 tab daily thereafter.  Take in the AM. (Patient taking differently: Take 3.5 mg by mouth daily. 1/2 tab daily for 1 months then 1 tab daily thereafter.  Take in the AM.) 90 tablet 3  . potassium chloride SA (KLOR-CON) 20 MEQ tablet Take 2 tablets (40 mEq total) by mouth daily. X 3 days 6 tablet 0   No facility-administered medications prior to visit.     Per HPI unless specifically indicated in ROS section below Review of Systems Objective:  BP 138/60 (BP Location: Left Arm, Patient Position: Sitting, Cuff Size: Normal)   Pulse 73   Temp (!) 97.5 F (36.4 C) (Temporal)   Ht 5' 9.5" (1.765 m)   Wt 169 lb 7 oz (76.9 kg)   SpO2 95%   BMI 24.66 kg/m   Wt Readings from Last 3 Encounters:  02/06/20 169 lb 7 oz (76.9 kg)  02/03/20 161 lb 3.2 oz (73.1 kg)  01/26/20 171 lb 1.6 oz (77.6 kg)      Physical Exam Vitals and nursing note reviewed.  Constitutional:      Appearance: Normal appearance. He is not ill-appearing.  Cardiovascular:     Rate and Rhythm: Normal rate and regular rhythm.     Pulses: Normal pulses.     Heart sounds: Murmur (2/6 mid systolic USB) heard.   Pulmonary:     Effort: Pulmonary effort is normal. No respiratory distress.     Breath sounds: Normal breath sounds. No wheezing, rhonchi or rales.  Musculoskeletal:     Right lower leg: No edema.     Left lower leg: No edema.  Neurological:     Mental Status: He is alert.  Psychiatric:        Mood and Affect: Mood normal.        Behavior: Behavior normal.         Assessment & Plan:  This visit occurred during the SARS-CoV-2 public health emergency.  Safety protocols were in place, including screening questions prior to the visit, additional usage of staff PPE, and extensive cleaning of exam room while observing appropriate contact time as indicated for disinfecting  solutions.   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  Will temporarily increase glimepiride to 8mg  with breakfast for the next week, have them continue keeping track with good sugar log as up to now.  Rybelsus dose just increased to 7mg  daily however unaffordable - they desire switch - will price out ozempic in its place. Start 0.25mg  weekly x2 wks then increase to 0.5mg  weekly.  Continue metformin 500/1000mg  daily to see if loose stools improve.  Discussed GLP1 RA like metformin is long acting medication. No personal or fmhx MTC. No nausea with rybelsus to date - anticipate should tolerate ozempic well.  Update if CBGs remain elevated despite above plan.       Relevant Medications   Semaglutide,0.25 or 0.5MG /DOS, (OZEMPIC, 0.25 OR 0.5 MG/DOSE,) 2 MG/1.5ML SOPN   S/P CABG x 5    Recovering remarkably well.       Anemia    Likely blood loss anemia after recent CABG.  Ok to restart oral iron MWF at this time.       Relevant Medications   ferrous sulfate 324 (65 Fe) MG TBEC (Start on 02/07/2020)       Meds ordered this encounter  Medications  . Semaglutide,0.25 or 0.5MG /DOS, (OZEMPIC, 0.25 OR 0.5 MG/DOSE,) 2 MG/1.5ML SOPN    Sig: Inject 0.25 mg into the skin once a week for 14 days, THEN 0.5 mg once a week.    Dispense:  1.5 mL    Refill:  3    To replace rybelsus  . ferrous sulfate 324 (65 Fe) MG TBEC    Sig: Take 1 tablet (325 mg total) by mouth every Monday, Wednesday, and Friday.   No orders of the defined types were placed in this encounter.   Patient  Instructions  Sugars are staying too high. Continue careful diet as up to now.  Let's stop rybelsus. In its place, price out ozempic 0.25mg  weekly for 2 weeks then increase to 0.5mg  weekly.  For the next week, let's increase glimepiride to 2 tablets with breakfast (8mg  daily).  Return in 3-4 weeks for follow up visit with Dr Sunday (diabetes follow up).  Ok to restart iron tablets ferrous sulfate 325mg  (65 FE) MWF dosing.    Follow up plan: Return in about 3 weeks (around 02/27/2020) for follow up visit.  , MD

## 2020-02-09 ENCOUNTER — Other Ambulatory Visit: Payer: Self-pay | Admitting: Family Medicine

## 2020-02-13 NOTE — Progress Notes (Signed)
Cardiology Office Note    Date:  02/15/2020   ID:  Luis Wise, DOB 1941/01/04, MRN [id number]  PCP:  Joaquim Nam, MD  Cardiologist:  Julien Nordmann, MD  Electrophysiologist:  None   Chief Complaint: Hospital follow up  History of Present Illness:   Luis Wise is a 79 y.o. male with history of recently diagnosed CAD status post 5-vessel CABG (LIMA to LAD, SVG to diagonal, sequential SVG to OM1 and OM2, and SVG to PDA), postoperative A. fib, LBBB, DM2, HTN, HLD who presents for hospital follow-up following his recent CABG with hospital admission from 11/22 through 11/27.  He was seen as a new patient by Dr. Mariah Milling on 01/02/2020 for ED follow-up for chest pain.  BP was noted to be significantly elevated in the 190s systolic.  He was noted to have a left bundle branch block.  Initial high-sensitivity troponin 47 with a delta of 44.  He was started on Imdur 30 mg and underwent coronary CTA on 01/12/2020 which showed a calcium score of 5706 which was the 90th percentile for age and sex matched control.  There were heavily calcified coronary arteries causing significant/severe stenosis of greater than 70% in the mid LAD, mid RCA, and ostial OM1.  FFR was significant in the mid LAD with a reading of 0.54.  In this setting he underwent diagnostic LHC on 01/19/2020 which showed severe heavily calcified three-vessel CAD with CTO of the proximal/mid LAD with bridging collaterals as well as faint right to left collaterals.  Normal LV systolic function with mildly elevated LVEDP at 13 mmHg.  Given his diabetic status and heavily calcified three-vessel CAD with CTO of the LAD it was recommended he be evaluated for CABG.  He was evaluated by CVTS on 01/23/2020 and felt to be an acceptable surgical candidate.  Preoperative echo on 07/26/2019 showed an EF of 55 to 60%, mild concentric LVH, grade 1 diastolic dysfunction, mild hypokinesis of the left ventricular apical septal wall, normal RV systolic function  and ventricular cavity size, and trivial mitral regurgitation.  He subsequently underwent 5 vessel CABG on 01/29/2020 by Dr. Donata Clay.  Postoperative intraoperative TEE was unchanged from preprocedure surface echo.  Postoperatively he developed A. fib and was treated with amiodarone bolus and drip with subsequent conversion to sinus rhythm.  Upon transferring to the floor he redeveloped A. fib that was brief and converted to sinus rhythm with oral medications.  During his admission he was hypertensive with subsequent titration of Lopressor.  He has been seen by PCP for hyperglycemia with subsequent titration in his diabetic regimen.  He comes in doing very well today and is accompanied by his wife.  Since his CABG, he has not had any significant chest pain concerning for angina.  No dyspnea, dizziness, palpitations, presyncope, syncope, lower extremity swelling, or falls.  Surgical sites are healing well.  He continues to advance activity as tolerated.  He is tolerating all medications.  Weights have remained stable at home ranging from the low 160s to upper 150s pounds.  His weight is unchanged by our scale.  He is sleeping well and at an incline though for comfort not for orthopnea.  He is using his incentive spirometer 3-4 times daily.  He has follow-up with CVTS later this month.   Labs independently reviewed: 01/2020 - potassium 3.6, BUN 20, serum creatinine 1.08, Hgb 10.8, PLT 273, magnesium 2.3, A1c 7.0, albumin 4.5, AST/ALT normal 12/2019 - direct LDL 81, TC 139, TG  245, HDL 25 06/2017 - TSH normal  Past Medical History:  Diagnosis Date  . Coronary artery disease    a. s/p 5-vessel CABG (LIMA-LAD, SVG-diag, sequential SVG-OM1-OM 2, & SVG-PDA)  . Diabetes mellitus with complication (HCC)   . Hyperlipidemia   . Hypertension   . Insomnia   . neuropathy    bilateral feet  . Postoperative atrial fibrillation [facility])     Past Surgical History:  Procedure Laterality Date  . BACK SURGERY     . CARDIAC CATHETERIZATION     01/19/2020  . CORONARY ARTERY BYPASS GRAFT N/A 01/29/2020   Procedure: CORONARY ARTERY BYPASS GRAFTING (CABG) TIMES FIVE USING LIMA to LAD; ENDOSCOPIC HARVESTED GREATER SAPHENOUS VEIN: SVG to PDA; SVG to sequenced OM1 & OM2.;  Surgeon: Kerin Perna, MD;  Location[facility];  Service: Open Heart Surgery;  Laterality: N/A;  . ENDOVEIN HARVEST OF GREATER SAPHENOUS VEIN Bilateral 01/29/2020   Procedure: ENDOVEIN HARVEST OF GREATER SAPHENOUS VEIN;  Surgeon: Kerin Perna, MD;  Location: [facility] OR;  Service: Open Heart Surgery;  Laterality: Bilateral;  . LAMINECTOMY  1993   L5-S1  . LEFT HEART CATH AND CORONARY ANGIOGRAPHY N/A 01/19/2020   Procedure: LEFT HEART CATH AND CORONARY ANGIOGRAPHY;  Surgeon: Iran Ouch, MD;  Location: [facility] INVASIVE CV LAB;  Service: Cardiovascular;  Laterality: N/A;  . MENISCUS REPAIR  1996  . TEE WITHOUT CARDIOVERSION N/A 01/29/2020   Procedure: TRANSESOPHAGEAL ECHOCARDIOGRAM (TEE);  Surgeon: Donata Clay, Theron Arista, MD;  Location: [facility] OR;  Service: Open Heart Surgery;  Laterality: N/A;  . TONSILLECTOMY      Current Medications: Current Meds  Medication Sig  . acetaminophen (TYLENOL) 500 MG tablet Take 2 tablets (1,000 mg total) by mouth every 6 (six) hours as needed.  Marland Kitchen amiodarone (PACERONE) 200 MG tablet Take 2 tablets (400 mg total) by mouth 2 (two) times daily. X 4 days, then decrease to 200 mg BID x 7 days, then decrease to 200 mg daily  . amLODipine (NORVASC) 10 MG tablet Take 1 tablet (10 mg total) by mouth daily.  Marland Kitchen aspirin EC 81 MG tablet Take 1 tablet (81 mg total) by mouth daily. Swallow whole.  . cholecalciferol (VITAMIN D3) 25 MCG (1000 UNIT) tablet Take 1,000 Units by mouth daily.  . Coenzyme Q10 (COQ10) 100 MG CAPS Take 100 mg by mouth daily.   . fenofibrate 160 MG tablet Take 1 tablet (160 mg total) by mouth daily.  . ferrous sulfate 324 (65 Fe) MG TBEC Take 1 tablet (325 mg total) by mouth every Monday, Wednesday, and  Friday.  Marland Kitchen glimepiride (AMARYL) 4 MG tablet TAKE 1 TABLET BY MOUTH EVERY DAY WITH BREAKFAST (Patient taking differently: Take 4 mg by mouth daily with breakfast.)  . lisinopril (ZESTRIL) 20 MG tablet TAKE 1 TABLET BY MOUTH EVERY DAY (Patient taking differently: 20 mg daily.)  . lisinopril-hydrochlorothiazide (ZESTORETIC) 20-25 MG tablet Take 1 tablet by mouth daily.  . metFORMIN (GLUCOPHAGE) 1000 MG tablet 500mg  in the AM and 1000mg  in the PM (Patient taking differently: Take 1,000 mg by mouth 2 (two) times daily with a meal. 500mg  in the AM and 1000mg  in the P)  . metoprolol tartrate (LOPRESSOR) 50 MG tablet Take 1 tablet (50 mg total) by mouth 2 (two) times daily.  . Misc Natural Products (URINOZINC PO) Take 1 tablet by mouth in the morning and at bedtime.   . Multiple Vitamins-Minerals (PRESERVISION AREDS 2 PO) Take 1 tablet by mouth 2 (two) times daily.   Marland Kitchen  ONETOUCH ULTRA test strip TEST TWICE A DAY. MAX PER INS 1/DAY  . pravastatin (PRAVACHOL) 20 MG tablet TAKE 1 TABLET BY MOUTH EVERY DAY (Patient taking differently: Take 20 mg by mouth daily.)  . Semaglutide,0.25 or 0.5MG /DOS, (OZEMPIC, 0.25 OR 0.5 MG/DOSE,) 2 MG/1.5ML SOPN Inject 0.25 mg into the skin once a week for 14 days, THEN 0.5 mg once a week. (Patient taking differently: Inject 0.25 mg into the skin once a week for 14 weeks, THEN 0.5 mg once a week.)  . Simethicone 125 MG CAPS Take 2 capsules by mouth.  . traMADol (ULTRAM) 50 MG tablet Take 1 tablet (50 mg total) by mouth every 4 (four) hours as needed for moderate pain.  . traZODone (DESYREL) 50 MG tablet TAKE 1 TO 1.5 TABLETS BY MOUTH EVERY NIGHT FOR SLEEP (Patient taking differently: Take 75 mg by mouth.)    Allergies:   Amoxil [amoxicillin]   Social History   Socioeconomic History  . Marital status: Married    Spouse name: Not on file  . Number of children: Not on file  . Years of education: Not on file  . Highest education level: Not on file  Occupational History  .  Occupation: part time   Tobacco Use  . Smoking status: Never Smoker  . Smokeless tobacco: Never Used  Vaping Use  . Vaping Use: Never used  Substance and Sexual Activity  . Alcohol use: Not Currently    Comment: Very rare- occasionaly beer or wine   . Drug use: No  . Sexual activity: Not on file  Other Topics Concern  . Not on file  Social History Narrative   Married 1970.   [employer], Scientist, water quality at [employer].   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   Social Determinants of Health   Financial Resource Strain: Not on file  Food Insecurity: Not on file  Transportation Needs: Not on file  Physical Activity: Not on file  Stress: Not on file  Social Connections: Not on file     Family History:  The patient's family history includes Hypertension in his mother. There is no history of Cancer, COPD, Diabetes, Heart disease, Hyperlipidemia, Stroke, Colon cancer, or Prostate cancer.  ROS:   Review of Systems  Constitutional: Negative for chills, diaphoresis, fever, malaise/fatigue and weight loss.  HENT: Negative for congestion.   Eyes: Negative for discharge and redness.  Respiratory: Negative for cough, sputum production, shortness of breath and wheezing.   Cardiovascular: Negative for chest pain, palpitations, orthopnea, claudication, leg swelling and PND.  Gastrointestinal: Negative for abdominal pain, heartburn, nausea and vomiting.  Musculoskeletal: Negative for falls and myalgias.  Skin: Negative for rash.  Neurological: Negative for dizziness, tingling, tremors, sensory change, speech change, focal weakness, loss of consciousness and weakness.  Endo/Heme/Allergies: Does not bruise/bleed easily.  Psychiatric/Behavioral: Negative for substance abuse. The patient is not nervous/anxious.   All other systems reviewed and are negative.    EKGs/Labs/Other Studies  Reviewed:    Studies reviewed were summarized above. The additional studies were reviewed today:  Coronary CTA 01/12/2020: Coronary Arteries:  Normal coronary origin.  Right dominance.  RCA is a large dominant artery that gives rise to PDA and PLA. Heavily calcified plaque throughout the vessel causing significant stenosis in the mid vessel (>70%)  Left main is a large artery that gives rise to LAD and LCX arteries.  LAD is a large vessel  that has calcified plaque causing mild stenosis in the proximal segment (25-49%) and severe stenosis in the mid segment (>70%).  LCX is a non-dominant artery that gives rise two obtuse marginal branches. There is calcified in the proximal LCx segment causing mild stenosis, calcified plaque in the ostial first obtuse marginal causing severe stenosis.  Other findings:  Normal pulmonary vein drainage into the left atrium.  Normal left atrial appendage without a thrombus.  Normal size of the pulmonary artery.  IMPRESSION: 1. Coronary calcium score of 5706. This was 98th percentile for age and sex matched control.  2. Normal coronary origin with right dominance.  3. Heavily calcified coronary arteries causing significant/severe stenosis (>70%) in the mid LAD, mid RCA, ostial OM1  4. CAD-RADS 4 Severe stenosis. (70-99% or > 50% left main). Cardiac catheterization or CT FFR is recommended. Consider symptom-guided anti-ischemic pharmacotherapy as well as risk factor modification per guideline directed care. Additional analysis with CT FFR will be submitted and reported separately.  1. Left Main:  No significant stenosis.  2. LAD: Significant stenosis in the mid segment.  FFRct 0.54 3. LCX: No significant stenosis. 4. RCA: No significant stenosis.  IMPRESSION: 1.  CT FFR analysis showed significant stenosis in the mid LAD. __________  [facility] 01/19/2020:   Prox RCA lesion is 60% stenosed.  Dist RCA lesion is 50%  stenosed.  RPDA lesion is 50% stenosed.  Mid RCA lesion is 70% stenosed.  2nd Mrg lesion is 80% stenosed.  Dist Cx lesion is 80% stenosed.  Prox LAD to Mid LAD lesion is 100% stenosed.  1st Diag lesion is 80% stenosed.  Prox LAD lesion is 70% stenosed.  The left ventricular systolic function is normal.  LV end diastolic pressure is mildly elevated.  The left ventricular ejection fraction is 55-65% by visual estimate.   1.  Severe heavily calcified three-vessel coronary artery disease.  Chronic occlusion of the proximal/mid LAD with bridging collaterals as well as faint right to left collaterals. 2.  Normal LV systolic function and mildly elevated left ventricular end-diastolic pressure at 13 mmHg.  Recommendations: Given diabetic status, heavily calcified 3 vessels disease and chronic occlusion of the LAD, I think the best option is CABG.  The anterior wall motion appears to be normal and thus the LAD territory seems to be viable. I instructed the patient to discontinue clopidogrel and continue aspirin. Consult CVTS for CABG. __________  Pre-CABG carotid ultrasound and ABIs 01/26/2020: Summary:  Right Carotid: Velocities in the right ICA are consistent with a 1-39%  stenosis.   Left Carotid: Velocities in the left ICA are consistent with a 1-39%  stenosis.  Vertebrals: Bilateral vertebral arteries demonstrate antegrade flow.  Subclavians: Normal flow hemodynamics were seen in bilateral subclavian        arteries.   Right ABI: Resting right ankle-brachial index is within normal range. No  evidence of significant right lower extremity arterial disease.  Left ABI: Resting left ankle-brachial index is within normal range. No  evidence of significant left lower extremity arterial disease.  Bilateral Extremity: Doppler waveforms remain within normal limits with  compression bilaterally for the radial arteries. Doppler waveforms remain  within normal limits with  compression bilaterally for the ulnar arteries.  __________  2D echo 01/26/2020: 1. Left ventricular ejection fraction, by estimation, is 55 to 60%. The  left ventricle has normal function. The left ventricle demonstrates  regional wall motion abnormalities (see scoring diagram/findings for  description). There is mild concentric left  ventricular hypertrophy.  Left ventricular diastolic parameters are  consistent with Grade I diastolic dysfunction (impaired relaxation). There  is mild hypokinesis of the left ventricular, apical septal wall.  2. Right ventricular systolic function is normal. The right ventricular  size is normal.  3. The mitral valve is normal in structure. Trivial mitral valve  regurgitation. No evidence of mitral stenosis.  4. The aortic valve is normal in structure. Aortic valve regurgitation is  not visualized. No aortic stenosis is present.  5. The inferior vena cava is normal in size with greater than 50%  respiratory variability, suggesting right atrial pressure of 3 mmHg. __________  Intraoperative TEE 01/29/2019 POST-OP IMPRESSIONS  - Left Ventricle: The left ventricle is unchanged from pre-bypass.  - Right Ventricle: The right ventricle appears unchanged from pre-bypass.  - Aorta: The aorta appears unchanged from pre-bypass.  - Left Atrium: The left atrium appears unchanged from pre-bypass.  - Left Atrial Appendage: The left atrial appendage appears unchanged from  pre-bypass.  - Aortic Valve: The aortic valve appears unchanged from pre-bypass.  - Mitral Valve: The mitral valve appears unchanged from pre-bypass.  - Tricuspid Valve: The tricuspid valve appears unchanged from pre-bypass.  - Interatrial Septum: The interatrial septum appears unchanged from  pre-bypass.  - Interventricular Septum: The interventricular septum appears unchanged  from  pre-bypass.  - Pericardium: The pericardium appears unchanged from pre-bypass.    EKG:  EKG is ordered  today.  The EKG ordered today demonstrates NSR, 73 bpm, LBBB (known)  Recent Labs: 01/26/2020: ALT 24 01/30/2020: Magnesium 2.3 02/03/2020: BUN 20; Creatinine, Ser 1.08; Hemoglobin 10.8; Platelets 273; Potassium 3.6; Sodium 137  Recent Lipid Panel    Component Value Date/Time   CHOL 139 01/02/2020 1551   CHOL 151 06/24/2018 0842   TRIG 245.0 (H) 01/02/2020 1551   TRIG 161 (H) 06/24/2018 0842   HDL 25.10 (L) 01/02/2020 1551   HDL 31 (L) 06/09/2017 1133   CHOLHDL 6 01/02/2020 1551   VLDL 49.0 (H) 01/02/2020 1551   VLDL 32 (H) 06/24/2018 0842   LDLCALC 77 06/09/2017 1133   LDLDIRECT 81.0 01/02/2020 1551    PHYSICAL EXAM:    VS:  BP (!) 150/72 (BP Location: Left Arm, Patient Position: Sitting, Cuff Size: Normal)   Pulse 73   Ht 5' 9.5" (1.765 m)   Wt 168 lb (76.2 kg)   SpO2 97%   BMI 24.45 kg/m   BMI: Body mass index is 24.45 kg/m.  Physical Exam Vitals reviewed.  Constitutional:      Appearance: He is well-developed and well-nourished.  HENT:     Head: Normocephalic and atraumatic.  Eyes:     General:        Right eye: No discharge.        Left eye: No discharge.  Neck:     Vascular: No JVD.  Cardiovascular:     Rate and Rhythm: Normal rate and regular rhythm.     Pulses: No midsystolic click and no opening snap.          Posterior tibial pulses are 2+ on the right side and 2+ on the left side.     Heart sounds: Normal heart sounds, S1 normal and S2 normal. Heart sounds not distant. No murmur heard. No friction rub.     Comments: Well-healing midline anterior chest wall surgical site without bleeding, ecchymosis, swelling, erythema, or tenderness to palpation.  Well-healing lower extremity surgical site. Pulmonary:     Effort: Pulmonary effort is normal. No respiratory distress.  Breath sounds: Normal breath sounds. No decreased breath sounds, wheezing or rales.  Chest:     Chest wall: No tenderness.  Abdominal:     General: There is no distension.      Palpations: Abdomen is soft.     Tenderness: There is no abdominal tenderness.  Musculoskeletal:        General: No edema.     Cervical back: Normal range of motion.  Skin:    General: Skin is warm and dry.     Nails: There is no clubbing or cyanosis.  Neurological:     Mental Status: He is alert and oriented to person, place, and time.  Psychiatric:        Mood and Affect: Mood and affect normal.        Speech: Speech normal.        Behavior: Behavior normal.        Thought Content: Thought content normal.        Judgment: Judgment normal.     Wt Readings from Last 3 Encounters:  02/15/20 168 lb (76.2 kg)  02/06/20 169 lb 7 oz (76.9 kg)  02/03/20 161 lb 3.2 oz (73.1 kg)     ASSESSMENT & PLAN:   1. CAD status post 5-vessel CABG on 01/29/2020: He is doing very well post CABG without any symptoms of angina, decompensation, or volume overload.  He has follow-up with CVTS later this month.  Continue secondary prevention with aspirin, amlodipine, carvedilol, and Lipitor as outlined below.  Restrictions per CVTS.  Aggressive risk factor modification with escalation in statin therapy as outlined below as well as recommendation for aggressive blood sugar and blood pressure.  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  Per guidelines, he will need further testing at the 68-month postoperative window which can be further evaluated and discussed at that time.  No plans for further ischemic testing at this time.  2. Postoperative A. Fib: No symptoms concerning for recurrence of arrhythmia.  He is now on amiodarone 200 mg daily as of today which will be continued at this time.  He has follow-up with CVTS later this month.  Transitioning from metoprolol to carvedilol as outlined below.  Should he have recurrence of A. fib moving forward OAC will need to be revisited at that time given his CHA2DS2-VASc of 5 (HTN, age x 2, DM, vascular disease).  3. HTN: Blood pressure is mildly elevated in  the office today though his wife, who is an Charity fundraiser, indicates this has been well controlled at home.  Stop Lopressor.  Start carvedilol 12.5 mg twice daily for added antihypertensive therapy.  He will otherwise continue amlodipine 10 mg, and lisinopril/HCTZ 40/25 mg.  4. HLD: LDL 81 in 12/2019.  Goal LDL less than 70.  Stop pravastatin.  Start atorvastatin 80 mg daily.  Check fasting lipid panel and LFT in 8 weeks.  If LDL remains above goal at that time recommend addition of ezetimibe 10 mg daily.  He remains on fenofibrate 160 mg daily.  5. Anemia: Likely in the postoperative state.  Did not require pRBC during admission.  Check CBC.  Disposition: F/u with Dr. Mariah Milling or an APP in 1 month.   Medication Adjustments/Labs and Tests Ordered: Current medicines are reviewed at length with the patient today.  Concerns regarding medicines are outlined above. Medication changes, Labs and Tests ordered today are summarized above and listed in the Patient Instructions accessible in Encounters.   Signed, Eula Listen, PA-C 02/15/2020  1:28 PM     [facility] [address] [place], [place] [address] [phone]

## 2020-02-15 ENCOUNTER — Ambulatory Visit (INDEPENDENT_AMBULATORY_CARE_PROVIDER_SITE_OTHER): Payer: Medicare Other | Admitting: Physician Assistant

## 2020-02-15 ENCOUNTER — Other Ambulatory Visit: Payer: Self-pay

## 2020-02-15 ENCOUNTER — Other Ambulatory Visit: Payer: Self-pay | Admitting: Family Medicine

## 2020-02-15 ENCOUNTER — Encounter: Payer: Self-pay | Admitting: Physician Assistant

## 2020-02-15 VITALS — BP 150/72 | HR 73 | Ht 69.5 in | Wt 168.0 lb

## 2020-02-15 DIAGNOSIS — Z951 Presence of aortocoronary bypass graft: Secondary | ICD-10-CM

## 2020-02-15 DIAGNOSIS — I9789 Other postprocedural complications and disorders of the circulatory system, not elsewhere classified: Secondary | ICD-10-CM

## 2020-02-15 DIAGNOSIS — D649 Anemia, unspecified: Secondary | ICD-10-CM

## 2020-02-15 DIAGNOSIS — I208 Other forms of angina pectoris: Secondary | ICD-10-CM

## 2020-02-15 DIAGNOSIS — I4891 Unspecified atrial fibrillation: Secondary | ICD-10-CM

## 2020-02-15 DIAGNOSIS — I10 Essential (primary) hypertension: Secondary | ICD-10-CM

## 2020-02-15 DIAGNOSIS — E785 Hyperlipidemia, unspecified: Secondary | ICD-10-CM

## 2020-02-15 DIAGNOSIS — I2510 Atherosclerotic heart disease of native coronary artery without angina pectoris: Secondary | ICD-10-CM

## 2020-02-15 DIAGNOSIS — E119 Type 2 diabetes mellitus without complications: Secondary | ICD-10-CM

## 2020-02-15 MED ORDER — ATORVASTATIN CALCIUM 80 MG PO TABS: 80.0000 mg | ORAL_TABLET | Freq: Every day | ORAL | 3 refills | Status: DC

## 2020-02-15 MED ORDER — AMIODARONE HCL 200 MG PO TABS
200.0000 mg | ORAL_TABLET | Freq: Every day | ORAL | 3 refills | Status: DC
Start: 2020-02-15 — End: 2020-02-16

## 2020-02-15 MED ORDER — AMIODARONE HCL 200 MG PO TABS
200.0000 mg | ORAL_TABLET | Freq: Every day | ORAL | 3 refills | Status: DC
Start: 2020-02-15 — End: 2020-02-15

## 2020-02-15 MED ORDER — CARVEDILOL 12.5 MG PO TABS: 12.5000 mg | ORAL_TABLET | Freq: Two times a day (BID) | ORAL | 3 refills | Status: DC

## 2020-02-15 NOTE — Patient Instructions (Signed)
Medication Instructions:  Stop taking your Pravastatin and Lopressor  Start: Lipitor 80mg  by mouth once a day Coreg 12.5mg  by mouth 2 times a day  Amiodarone was reduce to 200mg  once a day  *If you need a refill on your cardiac medications before your next appointment, please call your pharmacy*   Lab Work: BMET and CBC will be drawn today in lab If you have labs (blood work) drawn today and your tests are completely normal, you will receive your results only by: MyChart Message (if you have MyChart) OR . A paper copy in the mail If you have any lab test that is abnormal or we need to change your treatment, we will call you to review the results.  Lipid and Liver function panel to be drawn in 2 months  Walk into medical mall at the check in desk, they will direct you to lab registration, hours for labs are Monday-Friday 07:00am-5:30pm (no appointment necessary)    Testing/Procedures: None   Follow-Up: At [facility], you and your health needs are our priority.  As part of our continuing mission to provide you with exceptional heart care, we have created designated Provider Care Teams.  These Care Teams include your primary Cardiologist (physician) and Advanced Practice Providers (APPs -  Physician Assistants and Nurse Practitioners) who all work together to provide you with the care you need, when you need it.  We recommend signing up for the patient portal called "MyChart".  Sign up information is provided on this After Visit Summary.  MyChart is used to connect with patients for Virtual Visits (Telemedicine).  Patients are able to view lab/test results, encounter notes, upcoming appointments, etc.  Non-urgent messages can be sent to your provider as well.   To learn more about what you can do with MyChart, go to Marland Kitchen.    Your next appointment:   1 month(s)  The format for your next appointment:   In Person  Provider:   You may see [facility], MD  or one of the following Advanced Practice Providers on your designated Care Team:    [email or web address], NP  Julien Nordmann, PA-C  Nicolasa Ducking, PA-C  Cadence Bennett, Marisue Ivan  [place], NP    Other Instructions None

## 2020-02-16 ENCOUNTER — Other Ambulatory Visit: Payer: Self-pay | Admitting: *Deleted

## 2020-02-16 LAB — BASIC METABOLIC PANEL
BUN/Creatinine Ratio: 18 (ref 10–24)
BUN: 19 mg/dL (ref 8–27)
CO2: 24 mmol/L (ref 20–29)
Calcium: 10.5 mg/dL — ABNORMAL HIGH (ref 8.6–10.2)
Chloride: 93 mmol/L — ABNORMAL LOW (ref 96–106)
Creatinine, Ser: 1.05 mg/dL (ref 0.76–1.27)
GFR calc Af Amer: 79 mL/min/{1.73_m2} (ref >59)
GFR calc non Af Amer: 68 mL/min/{1.73_m2} (ref >59)
Glucose: 208 mg/dL — ABNORMAL HIGH (ref 65–99)
Potassium: 4.7 mmol/L (ref 3.5–5.2)
Sodium: 134 mmol/L (ref 134–144)

## 2020-02-16 LAB — CBC
Hematocrit: 35.6 % — ABNORMAL LOW (ref 37.5–51.0)
Hemoglobin: 12.2 g/dL — ABNORMAL LOW (ref 13.0–17.7)
MCH: 28.4 pg (ref 26.6–33.0)
MCHC: 34.3 g/dL (ref 31.5–35.7)
MCV: 83 fL (ref 79–97)
Platelets: 576 10*3/uL — ABNORMAL HIGH (ref 150–450)
RBC: 4.29 x10E6/uL (ref 4.14–5.80)
RDW: 13.9 % (ref 11.6–15.4)
WBC: 7.1 10*3/uL (ref 3.4–10.8)

## 2020-02-16 MED ORDER — AMIODARONE HCL 200 MG PO TABS
200.0000 mg | ORAL_TABLET | Freq: Every day | ORAL | 3 refills | Status: DC
Start: 2020-02-16 — End: 2020-05-10

## 2020-02-21 ENCOUNTER — Other Ambulatory Visit: Payer: Self-pay | Admitting: Cardiothoracic Surgery

## 2020-02-21 DIAGNOSIS — Z951 Presence of aortocoronary bypass graft: Secondary | ICD-10-CM

## 2020-02-24 ENCOUNTER — Telehealth: Payer: Self-pay | Admitting: Medical

## 2020-02-24 NOTE — Telephone Encounter (Signed)
Wife called to report vomiting, decreased appetite, and nasuea. The patient had CAD s/p CABG x 5 01/30/20 and was discharged in stable condition. .The patient was started lipitor and ozempic. The patient started Ozempic 3 weeks ago and started experiencing diarrhea and belching. Symptoms got worse when PCP increased the dose. He started the new dose 12/9. The last 3 days has not had a decreased appetite and is not eating. Last night he had a long vomiting episode, over 10 minutes. Wife called PCP first but it took a couple hours to get a response. They finally called back and are asking the MD in regards to diabetes medications and nausea. Wife is a retired Engineer, civil (consulting) and would like to d/c the lipitor to see if that helps. I said it's okay to stop to see if symptoms improve. In the past patient was on pravastatin and tolerated this well. Wife will call in next week to update [place] on symptoms. Can try another high-dose statin to see if this tolerated better. She denies recent fever, chills, chest pain, sob, LLE, orthopnea. Will inform Eula Listen, PA-C and Dr. Mariah Milling.   Luis Dicicco Fransico Michael, PA-C

## 2020-02-26 ENCOUNTER — Telehealth: Payer: Self-pay

## 2020-02-26 NOTE — Telephone Encounter (Signed)
[facility] Night - Client >>>Contains Verbal Order - Signature Required<<< TELEPHONE ADVICE RECORD AccessNurse Patient Name: Luis Wise Gender: Male DOB: 06/11/1940 Age: 79 Y 9 D Return Phone Number: [phone] (Primary), [phone] (Secondary) Address[place] [address] Client [facility] Night - Client Client Site [facility] - Night Physician Luis Wise - MD Contact Type Call Who Is Calling Patient / Member / Family / Caregiver Call Type Triage / Clinical Caller Name Luis Wise Relationship To Patient Spouse Return Phone Number [phone] (Primary) Chief Complaint Blood Sugar High Reason for Call Symptomatic / Request for Health Information Initial Comment Caller states her husband is very nauseated and is in his 3 wks post biopsy surgery, would like to know if something could be called in for his nausea. His blood sugars are high Translation No Nurse Assessment Nurse: Luis Dupont, RN, Luis Wise Date/Time (Eastern Time): 02/24/2020 12:34:39 PM Confirm and document reason for call. If symptomatic, describe symptoms. ---Caller states her husband is very nauseated and is in his 3 wks post by pass surgery, would like to know if something could be called in for his nausea. His blood sugars are high(173). Ozempic has just been increased to .5 from 2.5 once a week. caller states he has been vomiting this morning. caller states the nausea just started after the increased dose of the Ozempic. 12/16. also in Lipitor on 12/10. only vomited 1 time today. Does the patient have any new or worsening symptoms? ---Yes Will a triage be completed? ---Yes Related visit to physician within the last 2 weeks? ---Yes Does the PT have any chronic conditions? (i.e. diabetes, asthma, this includes High risk factors for pregnancy, etc.) ---Yes List chronic conditions. ---diabetes, High blood pressure, high  cholesterol. by pass surgery 3 weeks ago. Is this a behavioral health or substance abuse call? ---No Guidelines Guideline Title Affirmed Question Affirmed Notes Nurse Date/Time Luis Wise Time) Vomiting Vomiting a prescription medication Luis Dupont, RN, Luis Wise 02/24/2020 12:41:59 PM PLEASE NOTE: All timestamps contained within this report are represented as [place] Standard Time. CONFIDENTIALTY NOTICE: This fax transmission is intended only for the addressee. It contains information that is legally privileged, confidential or otherwise protected from use or disclosure. If you are not the intended recipient, you are strictly prohibited from reviewing, disclosing, copying using or disseminating any of this information or taking any action in reliance on or regarding this information. If you have received this fax in error, please notify us immediately by telephone so that we can arrange for its return to [place]. Phone: [phone], Toll-Free: [phone], Fax: [phone] Page: 2 of 4 Call Id: [id number] Disp. Time Luis Wise Time) Disposition Final User 02/24/2020 1:04:36 PM Paged On Call back to [facility] [place], RN, [facility] 02/24/2020 1:50:48 PM Called On-Call Provider Luis Dupont, RN, [facility] 02/24/2020 2:05:27 PM Pharmacy Call McClarnon, RN, Luis Wise Reason: RX for Zofran ODT 4 mg po q6 hours as needed for nausea. dispense 6 with no refills. [facility] [phone] 02/24/2020 12:46:59 PM Call PCP within 24 Hours Yes McClarnon, RN, Luis Wise Caller Disagree/Comply Comply Caller Understands Yes PreDisposition Call Doctor Care Advice Given Per Guideline CALL PCP WITHIN 24 HOURS: * SEES PILL IN VOMIT (usually less than 30 minutes after taking pill): If you see the pill in your vomit it is OK to take that pill again. You might want to wait a little while until your stomach is less upset. You might want to take it with a little bit of water and simple  food, like a cracker. * IF OFFICE WILL BE CLOSED: I'll page the  on-call provider now. EXCEPTION: from 9 pm to 9 am. Since this isn't urgent, we'll hold the page until morning. CARE ADVICE per Vomiting (Adult) guideline. Verbal Orders/Maintenance Medications Medication Refill Route Dosage Regime Duration Admin Instructions User Name Zofran ODT Oral 4mg  As Needed 1 tablet q 6 hours as needed for Nausea dispense 6 no refills. McClarnon, RN, Comments User: [employer], RN Date/Time Donzetta Matters Time): 02/24/2020 2:04:31 PM called wife back with the recommendation of the on call. if her husband vomits again go to the ED. If he develops abdominal pain, chest pain, shortness of breath, unusual sweating. call back or go to the ED. Decrease pt's Ozempic to previous dose. do not repeat any mornign medication and monitor b/p and glucose. [facility]an is being sent to [facility] 02/26/2020. caller prefers ODT. caller states understanding Paging DoctorName Phone DateTime Result/Outcome Message Type Notes [id number], Betty - MD Luis Wise 02/24/2020 1:04:36 PM Called On Call Provider - Left Message Doctor Paged 02/26/2020, Betty - MD Luis Wise 02/24/2020 1:50:48 PM Called On Call Provider - Reached Doctor Paged 02/26/2020, Betty - MD 02/24/2020 1:57:21 PM Spoke with On Call - General Message Result reported that caller is calling re: nausea with vomiting x1. caller vomited medications from this morning. caller is not repeat his morning medication. he is to monitor blood pressure and blood sugar. He is to decrease his Ozempic to his previous dose. If he vomits again he is to go to the ED. If he develops PLEASE NOTE: All timestamps contained within this report are represented as 02/26/2020 Standard Time. CONFIDENTIALTY NOTICE: This fax transmission is intended only for the addressee. It contains information that is legally privileged, confidential or otherwise protected from use or disclosure. If you are not the intended recipient, you are strictly prohibited  from reviewing, disclosing, copying using or disseminating any of this information or taking any action in reliance on or regarding this information. If you have received this fax in error, please notify [place] immediately by telephone so that we can arrange for its return to [place]. Phone: [phone], Toll-Free: [phone], Fax: [phone] Page: 3 of 4 Call Id: [phone] Paging DoctorName Phone DateTime Result/Outcome Message Type Notes abdominal pain, chest pain, shortness of breath or sweating he is to go to the ED. Nurse is to call in Zofran ODT 4 mg take 1 tablet every 6 hours as needed for nausea dispense 6 with no refills. PLEASE NOTE: All timestamps contained within this report are represented as [id number] Standard Time. CONFIDENTIALTY NOTICE: This fax transmission is intended only for the addressee. It contains information that is legally privileged, confidential or otherwise protected from use or disclosure. If you are not the intended recipient, you are strictly prohibited from reviewing, disclosing, copying using or disseminating any of this information or taking any action in reliance on or regarding this information. If you have received this fax in error, please notify [place] immediately by telephone so that we can arrange for its return to [place]. Phone: [phone], Toll-Free: [phone], Fax: [phone] Page: 4 of 4 Call Id: [phone] [facility] >>>Contains Verbal Order - Signature Required<<< [address] [place], [place] [phone] Fax: [phone] MEDICATION ORDER [facility] Night - Client [facility] - Night Date: 02/24/2020 From: QI Department To: 02/26/2020 - MD Please sign the order for the approved drug(s) given by our  call center nurse on your behalf. Fax to [phone] within 5 business days. Thank you. Date Luis Wise Time): 02/24/2020 10:24:13 AM  Triage RN: Donzetta Matters, RN NAME: Dewayne Hatch PHONE NUMBER: [phone] (Primary), [phone] (Secondary) BIRTHDATE: 09/25/1940 [place] [id number] CALLER: Spouse NAME: Lerry Liner Rx Given Medication Ordering Physician Refill Route Dosage Regime Duration Admin Instructions User Name Zofran ODT Luis Wise, Betty Oral 4mg  As Needed 1 tablet q 6 hours as needed for Nausea dispense 6 no refills. , RN, Luis Dupont MD Signature Date  Called and spoke with patient who stated that he is feeling better after his medications were adjusted. Patient stated that his fasting blood sugar this morning (02/26/2020) was 145. Patient is no longer having N/V. Patient has an office visit appointment with Dr. 02/28/2020 on 02/27/2020.

## 2020-02-26 NOTE — Telephone Encounter (Signed)
Was able to reach out to pt, pt states he is feeling much better, has been able to eat a soft diet. No n/v and has regain his appetite. Pt's wife stated that they consulted their son who is a Building surveyor and he advised them it was probably the Lipitor that pt just started on 12/10. Spoke with wife Mollie about Dr. Mariah Milling recommendation of  "I am concerned it could be the ozempic. Higher doses used for weight loss Would stop lipitor and ozempic for now, track sx,  If no relief in next few days would let us know. May be non cardiac in nature as well. May need to let PMD know"  Wife advised that pt has stopped the Lipitor since Sat 12/18 and has felt better since then, reports pt only takes the ozempic once a week, had last injection Thursday 12/16, but pt was already not feeling well before the injection. Wife reports pt is currently retaking his pravastatin 20mg  d/t stopping the Lipitor, and will consult Dr. at f/u visit on 1/10, although has a f/u visit on 12/22 with Dr. 1/23, Cardiovascular & Thoracic Surgery MD and will speak with him regarding medications as well. Otherwise all questions or concerns were address and no additional concerns at this time. Pt feeling much better. Agreeable to plan, will call back for anything further.

## 2020-02-26 NOTE — Telephone Encounter (Signed)
I am concerned it could be the ozempic. Higher doses used for weight loss Would stop lipitor and ozempic for now, track sx,  If no relief in next few days would let us know. May be non cardiac in nature as well. May need to let PMD know

## 2020-02-26 NOTE — Telephone Encounter (Signed)
Per note below-  Called and spoke with patient who stated that he is feeling better after his medications were adjusted. Patient stated that his fasting blood sugar this morning (02/26/2020) was 145. Patient is no longer having N/V. Patient has an office visit appointment with Dr. Para March on 02/27/2020.    Noted. Thanks.

## 2020-02-27 ENCOUNTER — Encounter: Payer: Self-pay | Admitting: Family Medicine

## 2020-02-27 ENCOUNTER — Other Ambulatory Visit: Payer: Self-pay

## 2020-02-27 ENCOUNTER — Ambulatory Visit (INDEPENDENT_AMBULATORY_CARE_PROVIDER_SITE_OTHER): Payer: Medicare Other | Admitting: Family Medicine

## 2020-02-27 VITALS — BP 120/78 | HR 81 | Temp 97.6°F | Ht 69.5 in | Wt 164.0 lb

## 2020-02-27 DIAGNOSIS — R1110 Vomiting, unspecified: Secondary | ICD-10-CM | POA: Diagnosis not present

## 2020-02-27 DIAGNOSIS — I208 Other forms of angina pectoris: Secondary | ICD-10-CM | POA: Diagnosis not present

## 2020-02-27 DIAGNOSIS — E118 Type 2 diabetes mellitus with unspecified complications: Secondary | ICD-10-CM | POA: Diagnosis not present

## 2020-02-27 DIAGNOSIS — E1165 Type 2 diabetes mellitus with hyperglycemia: Secondary | ICD-10-CM | POA: Diagnosis not present

## 2020-02-27 DIAGNOSIS — IMO0002 Reserved for concepts with insufficient information to code with codable children: Secondary | ICD-10-CM

## 2020-02-27 LAB — CBC WITH DIFFERENTIAL/PLATELET
Basophils Absolute: 0.1 10*3/uL (ref 0.0–0.1)
Basophils Relative: 1.9 % (ref 0.0–3.0)
Eosinophils Absolute: 0.3 10*3/uL (ref 0.0–0.7)
Eosinophils Relative: 6 % — ABNORMAL HIGH (ref 0.0–5.0)
HCT: 39.6 % (ref 39.0–52.0)
Hemoglobin: 12.9 g/dL — ABNORMAL LOW (ref 13.0–17.0)
Lymphocytes Relative: 8.7 % — ABNORMAL LOW (ref 12.0–46.0)
Lymphs Abs: 0.5 10*3/uL — ABNORMAL LOW (ref 0.7–4.0)
MCHC: 32.5 g/dL (ref 30.0–36.0)
MCV: 84.7 fl (ref 78.0–100.0)
Monocytes Absolute: 0.7 10*3/uL (ref 0.1–1.0)
Monocytes Relative: 12.1 % — ABNORMAL HIGH (ref 3.0–12.0)
Neutro Abs: 4.1 10*3/uL (ref 1.4–7.7)
Neutrophils Relative %: 71.3 % (ref 43.0–77.0)
Platelets: 263 10*3/uL (ref 150.0–400.0)
RBC: 4.68 Mil/uL (ref 4.22–5.81)
RDW: 14.8 % (ref 11.5–15.5)
WBC: 5.7 10*3/uL (ref 4.0–10.5)

## 2020-02-27 LAB — COMPREHENSIVE METABOLIC PANEL
ALT: 20 U/L (ref 0–53)
AST: 16 U/L (ref 0–37)
Albumin: 4.5 g/dL (ref 3.5–5.2)
Alkaline Phosphatase: 53 U/L (ref 39–117)
BUN: 22 mg/dL (ref 6–23)
CO2: 30 mEq/L (ref 19–32)
Calcium: 10.1 mg/dL (ref 8.4–10.5)
Chloride: 99 mEq/L (ref 96–112)
Creatinine, Ser: 1.37 mg/dL (ref 0.40–1.50)
GFR: 49.92 mL/min — ABNORMAL LOW (ref >60.00)
Glucose, Bld: 265 mg/dL — ABNORMAL HIGH (ref 70–99)
Potassium: 3.9 mEq/L (ref 3.5–5.1)
Sodium: 135 mEq/L (ref 135–145)
Total Bilirubin: 0.4 mg/dL (ref 0.2–1.2)
Total Protein: 7.2 g/dL (ref 6.0–8.3)

## 2020-02-27 LAB — LIPASE: Lipase: 72 U/L — ABNORMAL HIGH (ref 11.0–59.0)

## 2020-02-27 MED ORDER — RYBELSUS 7 MG PO TABS
ORAL_TABLET | ORAL | Status: DC
Start: 2020-02-27 — End: 2020-02-28

## 2020-02-27 NOTE — Progress Notes (Signed)
This visit occurred during the SARS-CoV-2 public health emergency.  Safety protocols were in place, including screening questions prior to the visit, additional usage of staff PPE, and extensive cleaning of exam room while observing appropriate contact time as indicated for disinfecting solutions.  Diabetes and CAD.  S/p CABG.  "All the surgery stuff feels fine."  No incision complaints.  No CP except he "can feel that it (the sternotomy) is there."  He had post op Afib that was addressed.  He'll have surgery f/u tomorrow.  Changed from pravastatin to atorvastatin prev.    He has some nausea and dec in appetite.  Vomited 3 days ago.    He stopped atorvastatin for the last 3 days, then restarted pravastatin.  He had tolerated that in the past.    ozempic started 12/1 but then had GI sx 2 days later.   2nd dose 1 week later with GI sx again noted.   3rd dose on 16th, then GI sx after that.    He hasn't had aches with statin use.    He has rybelsus 7mg  tabs and didn't have GI side effects with that.    Sugar 130-160s recently.  No fevers, no chills.    Meds, vitals, and allergies reviewed.   ROS: Per HPI unless specifically indicated in ROS section   GEN: nad, alert and oriented HEENT: ncat NECK: supple w/o LA CV: rrr. PULM: ctab, no inc wob ABD: soft, +bs, not ttp EXT: no edema SKIN: no acute rash, healed midline sternotomy scar noted.   34 minutes were devoted to patient care in this encounter (this includes time spent reviewing the patient's file/history, interviewing and examining the patient, counseling/reviewing plan with patient).

## 2020-02-27 NOTE — Patient Instructions (Addendum)
Stop and stay off ozempic for now.   Try to restart rybelsus later this week.  Update me next week about your sugar and how you feel.  Change back from pravastatin to lipitor.   Go to the lab on the way out.   If you have mychart we'll likely use that to update you.    Take care.  Glad to see you.

## 2020-02-28 ENCOUNTER — Ambulatory Visit
Admission: RE | Admit: 2020-02-28 | Discharge: 2020-02-28 | Disposition: A | Discharge status: Home / Self Care | Payer: Medicare Other | Source: Ambulatory Visit | Attending: Cardiothoracic Surgery | Admitting: Cardiothoracic Surgery

## 2020-02-28 ENCOUNTER — Ambulatory Visit (INDEPENDENT_AMBULATORY_CARE_PROVIDER_SITE_OTHER): Payer: Self-pay | Admitting: Physician Assistant

## 2020-02-28 ENCOUNTER — Encounter: Payer: Self-pay | Admitting: Family Medicine

## 2020-02-28 ENCOUNTER — Other Ambulatory Visit: Payer: Self-pay | Admitting: Family Medicine

## 2020-02-28 VITALS — BP 160/85 | HR 72 | Temp 97.6°F | Resp 20 | Ht 69.5 in | Wt 163.0 lb

## 2020-02-28 DIAGNOSIS — R1110 Vomiting, unspecified: Secondary | ICD-10-CM

## 2020-02-28 DIAGNOSIS — Z951 Presence of aortocoronary bypass graft: Secondary | ICD-10-CM

## 2020-02-28 DIAGNOSIS — IMO0002 Reserved for concepts with insufficient information to code with codable children: Secondary | ICD-10-CM

## 2020-02-28 DIAGNOSIS — I700 Atherosclerosis of aorta: Secondary | ICD-10-CM | POA: Diagnosis not present

## 2020-02-28 NOTE — Assessment & Plan Note (Signed)
We talked about options.   It may be the case that he can tolerate oral Rybelsus but not injected Ozempic. Recheck labs today.  The following applies assuming there is no change made based on his labs-  Stop and stay off ozempic for now.   Try to restart rybelsus later this week.  Update me next week about your sugar and how you feel.  Change back from pravastatin to lipitor.   I do not suspect Lipitor was the issue.  Discussed.  He did not have typical myalgias.

## 2020-02-28 NOTE — Progress Notes (Signed)
[address]       [place] [address]             [phone]      Luis Wise is a 79 y.o. male patient who underwent a bypass grafting x5 on 01/29/2020.  While in hospital patient developed atrial fibrillation which was treated with an amiodarone bolus and drip.  He ultimately converted to normal sinus rhythm and was progressed to p.o. Amio.   No diagnosis found. Past Medical History:  Diagnosis Date  . Coronary artery disease    a. s/p 5-vessel CABG (LIMA-LAD, SVG-diag, sequential SVG-OM1-OM 2, & SVG-PDA)  . Diabetes mellitus with complication (HCC)   . Hyperlipidemia   . Hypertension   . Insomnia   . neuropathy    bilateral feet  . Postoperative atrial fibrillation (HCC)    No past surgical history pertinent negatives on file. Scheduled Meds: Current Outpatient Medications on File Prior to Visit  Medication Sig Dispense Refill  . acetaminophen (TYLENOL) 500 MG tablet Take 2 tablets (1,000 mg total) by mouth every 6 (six) hours as needed. 30 tablet 0  . amiodarone (PACERONE) 200 MG tablet Take 1 tablet (200 mg total) by mouth daily. 90 tablet 3  . amLODipine (NORVASC) 10 MG tablet Take 1 tablet (10 mg total) by mouth daily. 90 tablet 1  . aspirin EC 81 MG tablet Take 1 tablet (81 mg total) by mouth daily. Swallow whole. 150 tablet 2  . atorvastatin (LIPITOR) 80 MG tablet Take 1 tablet (80 mg total) by mouth daily. 90 tablet 3  . carvedilol (COREG) 12.5 MG tablet Take 1 tablet (12.5 mg total) by mouth 2 (two) times daily. 180 tablet 3  . cholecalciferol (VITAMIN D3) 25 MCG (1000 UNIT) tablet Take 1,000 Units by mouth daily.    . Coenzyme Q10 (COQ10) 100 MG CAPS Take 100 mg by mouth daily.     . fenofibrate 160 MG tablet Take 1 tablet (160 mg total) by mouth daily. 90 tablet 1  . ferrous sulfate 324 (65 Fe) MG TBEC Take 1 tablet (325 mg total) by mouth every Monday, Wednesday, and Friday.    Marland Kitchen glimepiride (AMARYL) 4 MG tablet TAKE 1 TABLET BY MOUTH EVERY  DAY WITH BREAKFAST 90 tablet 1  . lisinopril (ZESTRIL) 20 MG tablet TAKE 1 TABLET BY MOUTH EVERY DAY 90 tablet 1  . lisinopril-hydrochlorothiazide (ZESTORETIC) 20-25 MG tablet Take 1 tablet by mouth daily. 90 tablet 1  . metFORMIN (GLUCOPHAGE) 1000 MG tablet 500mg  in the AM and 1000mg  in the PM    . Misc Natural Products (URINOZINC PO) Take 1 tablet by mouth in the morning and at bedtime.     . Multiple Vitamins-Minerals (PRESERVISION AREDS 2 PO) Take 1 tablet by mouth 2 (two) times daily.     ULTRA test strip TEST TWICE A DAY. MAX PER INS 1/DAY 100 strip 1  . Semaglutide (RYBELSUS) 7 MG TABS 1/2 tab daily for 1 month then 1 tab daily thereafter.  Take in the AM.    . Simethicone 125 MG CAPS Take 2 capsules by mouth.    . traZODone (DESYREL) 50 MG tablet TAKE 1 TO 1.5 TABLETS BY MOUTH EVERY NIGHT FOR SLEEP 135 tablet 3   No current facility-administered medications on file prior to visit.    Allergies  Allergen Reactions  . Ozempic (0.25 Or 0.5 Mg-Dose) [Semaglutide(0.25 Or 0.5mg -Dos)]     Presumed cause of GI upset.     Amoxil [Amoxicillin] Rash   Active Problems:   * No active hospital problems. *  Blood pressure (!) 160/85, pulse 72, temperature 97.6 F (36.4 C), temperature source Skin, resp. rate 20, height 5' 9.5" (1.765 m), weight 163 lb (73.9 kg), SpO2 98 %.  Subjective  Luis Wise is status post coronary bypass grafting with Dr. Donata Clay.  He has been resuming his normal activity and does not experience any chest pain or shortness of breath.  Objective  Cor: RRR, no murmur Pulm: CTA bilaterally and in all fields Abd: no tenderness Ext: no edema Wound: sternal incision and EVH site incision is healing well.   CLINICAL DATA:  Previous CABG.  Follow-up.  EXAM: CHEST - 2 VIEW  COMPARISON:  02/01/2020  FINDINGS: Previous median sternotomy and CABG. Heart size is normal. Ordinary aortic atherosclerotic calcification. The lungs are clear.  The vascularity is normal. No effusions. No significant bone finding.  IMPRESSION: Previous CABG. No active disease.   Electronically Signed   By: Paulina Fusi M.D.   On: 02/28/2020 11:46   Assessment & Plan   This patient is a 79 year old male who underwent coronary bypass grafting by Dr. Donata Clay.  Today, he is doing very well.  He has been walking several times a day and was very active before surgery.  His chest x-ray was reviewed and there were no concerns.  He is in normal sinus rhythm today and continues on amiodarone 200 mg daily.  I will leave this up to cardiology to discontinue.  His blood pressure is a little elevated today but has been normal at home per family.  I have asked for a referral for an endocrinologist which I have provided today.  His diabetes has been moderately controlled since surgery with average blood glucose levels ranging from 160-250.Marland Kitchen  We discussed good control being under 150.  He is interested in a cardiac rehab program at least for the short-term at [facility].  A referral was sent today.  They are concerned about Covid precautions in a rehab setting and they will discuss this with the rehab facility over the phone.  This patient has been vaccinated and has received a booster.  Overall, is doing very well.  I cleared him for driving since he is no longer requiring narcotic pain medication.  He is not need to come back to our clinic to be seen but has an appointment with cardiology in January.  I recommend he follow-up with endocrinology for diabetes control.  He is welcome to call our office if he has any questions or concerns.   Sharlene Dory 02/28/2020

## 2020-02-28 NOTE — Patient Instructions (Signed)
You are encouraged to enroll and participate in the outpatient cardiac rehab program beginning as soon as practical.  You may return to driving an automobile as long as you are no longer requiring oral narcotic pain relievers during the daytime.  It would be wise to start driving only short distances during the daylight and gradually increase from there as you feel comfortable.  Make every effort to stay physically active, get some type of exercise on a regular basis, and stick to a "heart healthy diet".  The long term benefits for regular exercise and a healthy diet are critically important to your overall health and wellbeing. 

## 2020-03-05 ENCOUNTER — Ambulatory Visit: Payer: Medicare Other | Admitting: Cardiovascular Disease

## 2020-03-06 ENCOUNTER — Other Ambulatory Visit (INDEPENDENT_AMBULATORY_CARE_PROVIDER_SITE_OTHER): Payer: Medicare Other

## 2020-03-06 ENCOUNTER — Other Ambulatory Visit: Payer: Self-pay

## 2020-03-06 ENCOUNTER — Other Ambulatory Visit: Payer: Self-pay | Admitting: Family Medicine

## 2020-03-06 DIAGNOSIS — E1165 Type 2 diabetes mellitus with hyperglycemia: Secondary | ICD-10-CM

## 2020-03-06 DIAGNOSIS — E118 Type 2 diabetes mellitus with unspecified complications: Secondary | ICD-10-CM

## 2020-03-06 DIAGNOSIS — Z1159 Encounter for screening for other viral diseases: Secondary | ICD-10-CM

## 2020-03-06 DIAGNOSIS — R1110 Vomiting, unspecified: Secondary | ICD-10-CM | POA: Diagnosis not present

## 2020-03-06 DIAGNOSIS — IMO0002 Reserved for concepts with insufficient information to code with codable children: Secondary | ICD-10-CM

## 2020-03-06 LAB — BASIC METABOLIC PANEL
BUN: 22 mg/dL (ref 6–23)
CO2: 27 mEq/L (ref 19–32)
Calcium: 10.1 mg/dL (ref 8.4–10.5)
Chloride: 98 mEq/L (ref 96–112)
Creatinine, Ser: 1.25 mg/dL (ref 0.40–1.50)
GFR: 55.72 mL/min — ABNORMAL LOW (ref >60.00)
Glucose, Bld: 252 mg/dL — ABNORMAL HIGH (ref 70–99)
Potassium: 3.9 mEq/L (ref 3.5–5.1)
Sodium: 134 mEq/L — ABNORMAL LOW (ref 135–145)

## 2020-03-06 LAB — LIPASE: Lipase: 119 U/L — ABNORMAL HIGH (ref 11.0–59.0)

## 2020-03-07 LAB — HEPATITIS C ANTIBODY
Hepatitis C Ab: NONREACTIVE
SIGNAL TO CUT-OFF: 0 (ref <1.00)

## 2020-03-09 ENCOUNTER — Other Ambulatory Visit: Payer: Self-pay | Admitting: Family Medicine

## 2020-03-11 ENCOUNTER — Ambulatory Visit (INDEPENDENT_AMBULATORY_CARE_PROVIDER_SITE_OTHER): Payer: Medicare Other | Admitting: Internal Medicine

## 2020-03-11 ENCOUNTER — Other Ambulatory Visit: Payer: Self-pay

## 2020-03-11 ENCOUNTER — Encounter: Payer: Medicare Other | Attending: Internal Medicine | Admitting: Dietician

## 2020-03-11 ENCOUNTER — Encounter: Payer: Self-pay | Admitting: Internal Medicine

## 2020-03-11 ENCOUNTER — Encounter: Payer: Self-pay | Admitting: Dietician

## 2020-03-11 VITALS — BP 142/80 | HR 80 | Ht 69.5 in | Wt 166.0 lb

## 2020-03-11 DIAGNOSIS — E119 Type 2 diabetes mellitus without complications: Secondary | ICD-10-CM

## 2020-03-11 DIAGNOSIS — E1165 Type 2 diabetes mellitus with hyperglycemia: Secondary | ICD-10-CM

## 2020-03-11 DIAGNOSIS — E1159 Type 2 diabetes mellitus with other circulatory complications: Secondary | ICD-10-CM | POA: Insufficient documentation

## 2020-03-11 DIAGNOSIS — IMO0002 Reserved for concepts with insufficient information to code with codable children: Secondary | ICD-10-CM

## 2020-03-11 LAB — POCT GLYCOSYLATED HEMOGLOBIN (HGB A1C): Hemoglobin A1C: 6.9 % — AB (ref 4.0–5.6)

## 2020-03-11 MED ORDER — ONETOUCH ULTRA VI STRP: ORAL_STRIP | 3 refills | Status: DC

## 2020-03-11 MED ORDER — DAPAGLIFLOZIN PROPANEDIOL 5 MG PO TABS: 5.0000 mg | ORAL_TABLET | Freq: Every day | ORAL | 11 refills | Status: DC

## 2020-03-11 NOTE — Progress Notes (Signed)
Patient ID: Luis Wise, male   DOB: 03-Mar-1941, 80 y.o.   MRN: [id number]   This visit occurred during the SARS-CoV-2 public health emergency.  Safety protocols were in place, including screening questions prior to the visit, additional usage of staff PPE, and extensive cleaning of exam room while observing appropriate contact time as indicated for disinfecting solutions.   HPI: Luis Wise is a 80 y.o.-year-old male, referred by his PCP, Dr. Para March, for management of DM2, dx in 2004-2005, non-insulin-dependent, uncontrolled, with complications (CAD - s/p CABG x5, CKD).  He is here with his wife, who is a former Nurse, learning disability, and offers part of the history especially related to his medications, blood sugars, and diet.  They mentions that sugars were better controlled until he had CABG 01/29/2020, after which, his sugars increased to the 200s.  They improved subsequently after adding GLP-1 receptor agonist.  Reviewed HbA1c levels: Lab Results  Component Value Date   HGBA1C 7.0 (H) 01/26/2020   HGBA1C 7.0 (H) 01/02/2020   HGBA1C 6.7 (A) 07/18/2019   HGBA1C 6.5 (A) 12/30/2018   HGBA1C 7.0 05/19/2018   HGBA1C 6.8 01/06/2018   HGBA1C 6.8 06/09/2017   HGBA1C 6.9 12/29/2016   HGBA1C 6.5 09/14/2016   HGBA1C 6.3 06/08/2016   Pt is on a regimen of: - Metformin 500 mg in am and 1000 mg in pm - Glimepiride 4 mg before breakfast She was also tried on Rybelsus but this was expensive.  He then tried Ozempic but he developed nausea and vomiting and then increase lipase on 02/27/2020 (78  >> 119).  Ozempic was stopped at that time. Prev. On Januvia >> tolerated well but expensive.  Pt checks his sugars 1x a day and they are (per review of his log): - am: before Sx: 160; after Sx: 120-222 - 2h after b'fast: n/c - before lunch: n/c - 2h after lunch: n/c - before dinner: before Sx: 100; after Sx: 76-163 - 2h after dinner: n/c - bedtime: n/c - nighttime: n/c Lowest sugar was 50s before sx; he has  hypoglycemia awareness at 60.  Highest sugar was 265.  Glucometer: OneTouch ultra  Pt's meals are: - Breakfast: 1 egg + toast or cereal or oatmeal - Lunch: grilled fish + veggies - Dinner: meat + veggies - Snacks: 2: nuts, wheat crackers He walks 3 miles 3 times a week for exercise.   - no CKD, last BUN/creatinine:  Lab Results  Component Value Date   BUN 22 03/06/2020   BUN 22 02/27/2020   CREATININE 1.25 03/06/2020   CREATININE 1.37 02/27/2020  On Lisinopril 20 mg twice a day.  -+ HL; last set of lipids: Lab Results  Component Value Date   CHOL 139 01/02/2020   HDL 25.10 (L) 01/02/2020   LDLCALC 77 06/09/2017   LDLDIRECT 81.0 01/02/2020   TRIG 245.0 (H) 01/02/2020   CHOLHDL 6 01/02/2020  On Atorvastatin 80 << pravastatin 20 mg daily, fenofibrate 160 mg daily, CoQ10.  On ASA 81.  - last eye exam was in 10/2019. No DR.   - no numbness and tingling in his feet.  Pt has FH of DM in father in his 33s.  He is also on amiodarone.  ROS: Constitutional: no weight gain, + weight loss, + decreased appetite, no fatigue, no subjective hyperthermia, no subjective hypothermia, +3x nocturia Eyes: no blurry vision, no xerophthalmia ENT: no sore throat, no nodules palpated in neck, no dysphagia, no odynophagia, no hoarseness, no tinnitus, no hypoacusis Cardiovascular: no  CP, no SOB, no palpitations, no leg swelling Respiratory: no cough, no SOB, no wheezing Gastrointestinal: no N, no V, no D, no C, no acid reflux Musculoskeletal: no muscle, no joint aches Skin: no rash, no hair loss Neurological: no tremors, no numbness or tingling/no dizziness/no HAs Psychiatric: no depression, no anxiety  Past Medical History:  Diagnosis Date  . Coronary artery disease    a. s/p 5-vessel CABG (LIMA-LAD, SVG-diag, sequential SVG-OM1-OM 2, & SVG-PDA)  . Diabetes mellitus with complication (HCC)   . Hyperlipidemia   . Hypertension   . Insomnia   . neuropathy    bilateral feet  .  Postoperative atrial fibrillation [facility])    Past Surgical History:  Procedure Laterality Date  . BACK SURGERY    . CARDIAC CATHETERIZATION     01/19/2020  . CORONARY ARTERY BYPASS GRAFT N/A 01/29/2020   Procedure: CORONARY ARTERY BYPASS GRAFTING (CABG) TIMES FIVE USING LIMA to LAD; ENDOSCOPIC HARVESTED GREATER SAPHENOUS VEIN: SVG to PDA; SVG to sequenced OM1 & OM2.;  Surgeon: Kerin Perna, MD;  Location: [facility] OR;  Service: Open Heart Surgery;  Laterality: N/A;  . ENDOVEIN HARVEST OF GREATER SAPHENOUS VEIN Bilateral 01/29/2020   Procedure: ENDOVEIN HARVEST OF GREATER SAPHENOUS VEIN;  Surgeon: Kerin Perna, MD;  Location[facility] OR;  Service: Open Heart Surgery;  Laterality: Bilateral;  . LAMINECTOMY  1993   L5-S1  . LEFT HEART CATH AND CORONARY ANGIOGRAPHY N/A 01/19/2020   Procedure: LEFT HEART CATH AND CORONARY ANGIOGRAPHY;  Surgeon: Iran Ouch, MD;  Location: [facility] INVASIVE CV LAB;  Service: Cardiovascular;  Laterality: N/A;  . MENISCUS REPAIR  1996  . TEE WITHOUT CARDIOVERSION N/A 01/29/2020   Procedure: TRANSESOPHAGEAL ECHOCARDIOGRAM (TEE);  Surgeon: Donata Clay, Theron Arista, MD;  Location: [facility] OR;  Service: Open Heart Surgery;  Laterality: N/A;  . TONSILLECTOMY     Social History   Socioeconomic History  . Marital status: Married    Spouse name: Not on file  . Number of children: Not on file  . Years of education: Not on file  . Highest education level: Not on file  Occupational History  . Occupation: CFO  Tobacco Use  . Smoking status: Never Smoker  . Smokeless tobacco: Never Used  Vaping Use  . Vaping Use: Never used  Substance and Sexual Activity  . Alcohol use: Not Currently    Alcohol/week: 0.0 standard drinks    Comment: Very rare- occasionaly beer or wine   . Drug use: No  . Sexual activity: Yes    Birth control/protection: None  Other Topics Concern  . Not on file  Social History Narrative   Married 1970.   [employer], Scientist, water quality at [employer].   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   Social Determinants of Health   Financial Resource Strain: Not on file  Food Insecurity: Not on file  Transportation Needs: Not on file  Physical Activity: Not on file  Stress: Not on file  Social Connections: Not on file  Intimate Partner Violence: Not on file   Current Outpatient Medications on File Prior to Visit  Medication Sig Dispense Refill  . amiodarone (PACERONE) 200 MG tablet Take 1 tablet (200 mg total) by mouth daily. 90 tablet 3  . amLODipine (NORVASC) 10 MG tablet Take 1 tablet (10 mg total) by mouth daily. 90 tablet 1  . aspirin EC 81 MG tablet Take 1  tablet (81 mg total) by mouth daily. Swallow whole. 150 tablet 2  . atorvastatin (LIPITOR) 80 MG tablet Take 1 tablet (80 mg total) by mouth daily. 90 tablet 3  . carvedilol (COREG) 12.5 MG tablet Take 1 tablet (12.5 mg total) by mouth 2 (two) times daily. 180 tablet 3  . cholecalciferol (VITAMIN D3) 25 MCG (1000 UNIT) tablet Take 1,000 Units by mouth daily.    . Coenzyme Q10 (COQ10) 100 MG CAPS Take 100 mg by mouth daily.     . fenofibrate 160 MG tablet Take 1 tablet (160 mg total) by mouth daily. 90 tablet 1  . ferrous sulfate 324 (65 Fe) MG TBEC Take 1 tablet (325 mg total) by mouth every Monday, Wednesday, and Friday.    Marland Kitchen glimepiride (AMARYL) 4 MG tablet TAKE 1 TABLET BY MOUTH EVERY DAY WITH BREAKFAST 90 tablet 1  . lisinopril (ZESTRIL) 20 MG tablet TAKE 1 TABLET BY MOUTH EVERY DAY 90 tablet 1  . lisinopril-hydrochlorothiazide (ZESTORETIC) 20-25 MG tablet Take 1 tablet by mouth daily. 90 tablet 1  . metFORMIN (GLUCOPHAGE) 1000 MG tablet 500mg  in the AM and 1000mg  in the PM    . Misc Natural Products (URINOZINC PO) Take 1 tablet by mouth in the morning and at bedtime.     . Multiple Vitamins-Minerals (PRESERVISION AREDS 2 PO) Take 1 tablet by mouth 2 (two) times daily.     .  Simethicone 125 MG CAPS Take 2 capsules by mouth.    . traZODone (DESYREL) 50 MG tablet TAKE 1 TO 1.5 TABLETS BY MOUTH EVERY NIGHT FOR SLEEP 135 tablet 3   No current facility-administered medications on file prior to visit.   Allergies  Allergen Reactions  . Ozempic (0.25 Or 0.5 Mg-Dose) [Semaglutide(0.25 Or 0.5mg -Dos)]     Presumed cause of GI upset.    Marland Kitchen Amoxil [Amoxicillin] Rash   Family History  Problem Relation Age of Onset  . Hypertension Mother   . Diabetes Father   . Cancer Neg Hx   . COPD Neg Hx   . Heart disease Neg Hx   . Hyperlipidemia Neg Hx   . Stroke Neg Hx   . Colon cancer Neg Hx   . Prostate cancer Neg Hx    PE: BP (!) 142/80   Pulse 80   Ht 5' 9.5" (1.765 m)   Wt 166 lb (75.3 kg)   SpO2 95%   BMI 24.16 kg/m  Wt Readings from Last 3 Encounters:  03/11/20 166 lb (75.3 kg)  02/28/20 163 lb (73.9 kg)  02/27/20 164 lb (74.4 kg)   Constitutional: normal weight, in NAD Eyes: PERRLA, EOMI, no exophthalmos ENT: moist mucous membranes, no thyromegaly, no cervical lymphadenopathy Cardiovascular: RRR, No MRG Respiratory: CTA B Gastrointestinal: abdomen soft, NT, ND, BS+ Musculoskeletal: no deformities, strength intact in all 4 Skin: moist, warm, no rashes Neurological: no tremor with outstretched hands, DTR normal in all 4  ASSESSMENT: 1. DM2, non-insulin-dependent, uncontrolled, with complications - CAD, s/p CABG x5 - CKD  PLAN:  1. Patient with long-standing, uncontrolled diabetes, on oral antidiabetic regimen, which became insufficient.  His most recent HbA1c was 7.0%, but his sugars worsened especially after his CABG 1.5 mo ago. PCP tried Rybelsus but this was expensive for the patient.  He then tried Ozempic, which he could not tolerate due to nausea and vomiting, despite some improvement in his blood sugars, and also developed a high lipase, at 82 >> 119.  The GLP-1 receptor agonist was stopped at that  time.  He is currently only on Metformin and  sulfonylurea.  He was advised to increase memantine but did not do so yet. -Reviewing his blood sugars at home, they are above target in am, 160-200s, especially after stopping Ozempic.  He rarely checks later in the day, but they are slightly better. -At this visit, we discussed about improving diet and pt also accepts a referral to nutrition -We also discussed that for now, I would not recommend a GLP-1 receptor agonist due to the elevated lipase, but would recommend an H&H chemotherapy due to his history of CKD and CAD.  I explained the benefits and possible side effects of the medication.  I advised him to stay very well-hydrated while on the medications.  He has 3 times a night nocturia as of now.  I advised him to take the SGLT2 inhibitor in the morning, before breakfast.  We will start at a low dose. If this is not enough, pending CBGs towards the end of the day, at next visit, we may need to add long-acting insulin, or sulfonylurea before dinner. -For now, we will continue Metformin and glimepiride - I suggested to:  Patient Instructions  Please continue: - Metformin 500 mg in am and 1000 mg in pm - Glimepiride 4 mg before breakfast  Please add: - Farxiga 5 mg before breakfast  Please let me know if the sugars are consistently <80 or >200.  Please return in 1.5 months with your sugar log.   - Strongly advised him to start checking sugars at different times of the day - check 1-2x a day, rotating checks - discussed about CBG targets for treatment: 80-130 mg/dL before meals and <761 mg/dL after meals; target [id number] <7%. - given sugar log and advised how to fill it and to bring it at next appt  - given foot care handout and explained the principles  - given instructions for hypoglycemia management "15-15 rule"  - advised for yearly eye exams  - Return to clinic in 1.5 mo with sugar log   Carlus Pavlov, MD PhD [facility] Endocrinology

## 2020-03-11 NOTE — Progress Notes (Signed)
Diabetes Self-Management Education  Visit Type: First/Initial  Appt. Start Time: 1040 Appt. End Time: 1200  03/11/2020  [name], identified by name and date of birth, is a 80 y.o. male with a diagnosis of Diabetes: Type 2.   ASSESSMENT Patient is here today with his wife.  His wife is a retired Engineer, civil (consulting). His wife states that she feels overwhelmed about how to cook for diabetes and CAD.  She feels that patient needs to gain weight.  History includes Type 2 diabetes (2004/2005),  CABGx5 01/29/2020, CKD. Medications include: Metformin, Glimepiride and is to start [place].  He tolerated Januvia but changed to Rybelsus and then Oaempic which have been discontinued as he did not tolerate these.  Lipase is elevated. A1C:  6.9% 03/11/2020, 7% 01/26/2020  Weight hx: 166 lbs today 162 lbs post op 01/2020 175 lbs UBW  Patient lives with his wife who does the shopping and cooking.  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  Weight 166 lb (75.3 kg). Body mass index is 24.16 kg/m.   Diabetes Self-Management Education - 03/11/20 1006      Visit Information   Visit Type First/Initial      Initial Visit   Diabetes Type Type 2    Are you currently following a meal plan? Yes    What type of meal plan do you follow? heart healthy and diabetic    Are you taking your medications as prescribed? Yes    Date Diagnosed 2004      Health Coping   How would you rate your overall health? Good      Psychosocial Assessment   Patient Belief/Attitude about Diabetes Motivated to manage diabetes    Self-care barriers None    Self-management support Doctor's office    Other persons present Patient;Spouse/SO    Patient Concerns Nutrition/Meal planning    Special Needs None    Preferred Learning Style No preference indicated    Learning Readiness Ready    How often do you need to have someone help you when you read instructions, pamphlets, or other written materials  from your doctor or pharmacy? 1 - Never    What is the last grade level you completed in school? Master's degree      Pre-Education Assessment   Patient understands the diabetes disease and treatment process. Needs Instruction    Patient understands incorporating nutritional management into lifestyle. Needs Instruction    Patient undertands incorporating physical activity into lifestyle. Needs Instruction    Patient understands using medications safely. Needs Instruction    Patient understands monitoring blood glucose, interpreting and using results Needs Instruction    Patient understands prevention, detection, and treatment of acute complications. Needs Instruction    Patient understands prevention, detection, and treatment of chronic complications. Needs Instruction    Patient understands how to develop strategies to address psychosocial issues. Needs Instruction    Patient understands how to develop strategies to promote health/change behavior. Needs Instruction      Complications   Last HgB A1C per patient/outside source 6.9 %   03/12/2019, 7% 01/2020   How often do you check your blood sugar? 1-2 times/day    Fasting Blood glucose range (mg/dL) 706-237;628-315;>176    Postprandial Blood glucose range (mg/dL) >160    Number of hypoglycemic episodes per month 1    Number of hyperglycemic episodes per week 7    Can you tell when your blood sugar is high? No  Have you had a dilated eye exam in the past 12 months? Yes    Have you had a dental exam in the past 12 months? Yes    Are you checking your feet? Yes    How many days per week are you checking your feet? 4      Dietary Intake   Breakfast egg, [employer] toast, unsweetened applesauce and/or orange OR cereal (low sugar such as chex) and almond milk or Fairlife dairy milk 1% or oatmeal, with walnuts, raisins, swerve (sugar alcohol)    Snack (morning) none    Lunch leftovers OR grilled fish, green beans, white rice or baked potato with  margarine (small amount) OR tuna salad sandwich on [employer]    Snack (afternoon) unsalted nuts or occasional kashi bar or wheat crackers with PB or cheese    Dinner grilled chicken or pork or fish, vegetable, brown rice, occasional salad OR occasional hamburger or occasional spaghetti or taco salad    Snack (evening) none    Beverage(s) water, unsweetened decaf tea with splenda and lemon, 1 cup decaf coffee with creamer      Exercise   Exercise Type Light (walking / raking leaves)   walking 2 miles 3 times per week   How many days per week to you exercise? 3    How many minutes per day do you exercise? 60    Total minutes per week of exercise 180      Patient Education   Previous Diabetes Education No    Disease state  Definition of diabetes, type 1 and 2, and the diagnosis of diabetes    Nutrition management  Role of diet in the treatment of diabetes and the relationship between the three main macronutrients and blood glucose level;Meal options for control of blood glucose level and chronic complications.;Information on hints to eating out and maintain blood glucose control.;Meal timing in regards to the patients' current diabetes medication.    Physical activity and exercise  Role of exercise on diabetes management, blood pressure control and cardiac health.;Identified with patient nutritional and/or medication changes necessary with exercise.    Medications Reviewed patients medication for diabetes, action, purpose, timing of dose and side effects.    Monitoring Taught/discussed recording of test results and interpretation of SMBG.    Acute complications Taught treatment of hypoglycemia - the 15 rule.      Individualized Goals (developed by patient)   Nutrition General guidelines for healthy choices and portions discussed    Physical Activity Exercise 3-5 times per week;30 minutes per day    Medications take my medication as prescribed    Monitoring  test my blood glucose as discussed     Health Coping discuss diabetes with (comment)   MD, RD, CDCES     Post-Education Assessment   Patient understands the diabetes disease and treatment process. Demonstrates understanding / competency    Patient understands incorporating nutritional management into lifestyle. Demonstrates understanding / competency    Patient undertands incorporating physical activity into lifestyle. Demonstrates understanding / competency    Patient understands using medications safely. Demonstrates understanding / competency    Patient understands monitoring blood glucose, interpreting and using results Demonstrates understanding / competency    Patient understands prevention, detection, and treatment of acute complications. Demonstrates understanding / competency    Patient understands prevention, detection, and treatment of chronic complications. Demonstrates understanding / competency    Patient understands how to develop strategies to address psychosocial issues. Demonstrates understanding / competency  Patient understands how to develop strategies to promote health/change behavior. Demonstrates understanding / competency      Outcomes   Expected Outcomes Demonstrated interest in learning. Expect positive outcomes    Future DMSE PRN    Program Status Completed           Individualized Plan for Diabetes Self-Management Training:   Learning Objective:  Patient will have a greater understanding of diabetes self-management. Patient education plan is to attend individual and/or group sessions per assessed needs and concerns.   Plan:   Patient Instructions  Continue to stay active.  Aim for 30 minutes most days.  Alternate walking and other aerobic activity with light weights/bands, yoga or [place] chi as allowed by your doctor.  Continue a healthy diet being mindful of the amount of fat at each meal. Incorporate more meatless meals throughout your week.  Examples include:  Vegetable plate (which  includes a bean)  Bean burrito. Fresh or steamed veges and fruit  Bean or lentil soup, salad or sandwich  Large salad with rinsed canned beans, fruit  Vegetarian burger on a bun with roasted sweet potato wedges.  Vegetable stew, brown rice (optional), salad Choose whole grains most often:  Whole grain bread, brown rice, quinoa, bulgar wheat, barley, etc. Choose plenty of non starchy vegetables. Eat consistent meals and include a snack in the afternoon particularly on days that you exercise to prevent a low blood sugar. Avoid skipping meals. Does a small bedtime snack (small handful of nuts and small fruit) make any difference in your morning blood sugar. Monitor your weight to be sure you are not continuing to lose.  Check your blood sugar as recommended by your MD. Continue your medication as prescribed.  Please call me if you have any questions.  Follow ups are helpful for many.  If I do not see you sooner, I would like to see you in six months to a year.  It was good to meet you and your wife!    Expected Outcomes:  Demonstrated interest in learning. Expect positive outcomes  Education material provided: ADA - How to Thrive: A Guide for Your Journey with Diabetes and Meal plan card, Mediterranean diet, Diet and Diabetes:  Recipes for Success from [employer]  If problems or questions, patient to contact team via:  Phone  Future DSME appointment: PRN

## 2020-03-11 NOTE — Patient Instructions (Addendum)
Please continue: - Metformin 500 mg in am and 1000 mg in pm - Glimepiride 4 mg before breakfast  Please add: - Farxiga 5 mg before breakfast  Please let me know if the sugars are consistently <80 or >200.  Please return in 1.5 months with your sugar log.   PATIENT INSTRUCTIONS FOR TYPE 2 DIABETES:  DIET AND EXERCISE Diet and exercise is an important part of diabetic treatment.  We recommended aerobic exercise in the form of brisk walking (working between 40-60% of maximal aerobic capacity, similar to brisk walking) for 150 minutes per week (such as 30 minutes five days per week) along with 3 times per week performing 'resistance' training (using various gauge rubber tubes with handles) 5-10 exercises involving the major muscle groups (upper body, lower body and core) performing 10-15 repetitions (or near fatigue) each exercise. Start at half the above goal but build slowly to reach the above goals. If limited by weight, joint pain, or disability, we recommend daily walking in a swimming pool with water up to waist to reduce pressure from joints while allow for adequate exercise.    BLOOD GLUCOSES Monitoring your blood glucoses is important for continued management of your diabetes. Please check your blood glucoses 2-4 times a day: fasting, before meals and at bedtime (you can rotate these measurements - e.g. one day check before the 3 meals, the next day check before 2 of the meals and before bedtime, etc.).   HYPOGLYCEMIA (low blood sugar) Hypoglycemia is usually a reaction to not eating, exercising, or taking too much insulin/ other diabetes drugs.  Symptoms include tremors, sweating, hunger, confusion, headache, etc. Treat IMMEDIATELY with 15 grams of Carbs: . 4 glucose tablets .  cup regular juice/soda . 2 tablespoons raisins . 4 teaspoons sugar . 1 tablespoon honey Recheck blood glucose in 15 mins and repeat above if still symptomatic/blood glucose <100.  RECOMMENDATIONS TO  REDUCE YOUR RISK OF DIABETIC COMPLICATIONS: * Take your prescribed MEDICATION(S) * Follow a DIABETIC diet: Complex carbs, fiber rich foods, (monounsaturated and polyunsaturated) fats * AVOID saturated/trans fats, high fat foods, >2,300 mg salt per day. * EXERCISE at least 5 times a week for 30 minutes or preferably daily.  * DO NOT SMOKE OR DRINK more than 1 drink a day. * Check your FEET every day. Do not wear tightfitting shoes. Contact us if you develop an ulcer * See your EYE doctor once a year or more if needed * Get a FLU shot once a year * Get a PNEUMONIA vaccine once before and once after age 68 years  GOALS:  * Your Hemoglobin A1c of <7%  * fasting sugars need to be <130 * after meals sugars need to be <180 (2h after you start eating) * Your Systolic BP should be 140 or lower  * Your Diastolic BP should be 80 or lower  * Your HDL (Good Cholesterol) should be 40 or higher  * Your LDL (Bad Cholesterol) should be 100 or lower. * Your Triglycerides should be 150 or lower  * Your Urine microalbumin (kidney function) should be <30 * Your Body Mass Index should be 25 or lower    Please consider the following ways to cut down carbs and fat and increase fiber and micronutrients in your diet: - substitute whole grain for white bread or pasta - substitute brown rice for white rice - substitute 90-calorie flat bread pieces for slices of bread when possible - substitute sweet potatoes or yams for white potatoes -  substitute humus for margarine - substitute tofu for cheese when possible - substitute almond or rice milk for regular milk (would not drink soy milk daily due to concern for soy estrogen influence on breast cancer risk) - substitute dark chocolate for other sweets when possible - substitute water - can add lemon or orange slices for taste - for diet sodas (artificial sweeteners will trick your body that you can eat sweets without getting calories and will lead you to overeating  and weight gain in the long run) - do not skip breakfast or other meals (this will slow down the metabolism and will result in more weight gain over time)  - can try smoothies made from fruit and almond/rice milk in am instead of regular breakfast - can also try old-fashioned (not instant) oatmeal made with almond/rice milk in am - order the dressing on the side when eating salad at a restaurant (pour less than half of the dressing on the salad) - eat as little meat as possible - can try juicing, but should not forget that juicing will get rid of the fiber, so would alternate with eating raw veg./fruits or drinking smoothies - use as little oil as possible, even when using olive oil - can dress a salad with a mix of balsamic vinegar and lemon juice, for e.g. - use agave nectar, stevia sugar, or regular sugar rather than artificial sweateners - steam or broil/roast veggies  - snack on veggies/fruit/nuts (unsalted, preferably) when possible, rather than processed foods - reduce or eliminate aspartame in diet (it is in diet sodas, chewing gum, etc) Read the labels!  Try to read Dr. Janene Harvey book: "Program for Reversing Diabetes" for other ideas for healthy eating.

## 2020-03-11 NOTE — Telephone Encounter (Signed)
Please Advise

## 2020-03-11 NOTE — Patient Instructions (Signed)
Continue to stay active.  Aim for 30 minutes most days.  Alternate walking and other aerobic activity with light weights/bands, yoga or [place] chi as allowed by your doctor.  Continue a healthy diet being mindful of the amount of fat at each meal. Incorporate more meatless meals throughout your week.  Examples include:  Vegetable plate (which includes a bean)  Bean burrito. Fresh or steamed veges and fruit  Bean or lentil soup, salad or sandwich  Large salad with rinsed canned beans, fruit  Vegetarian burger on a bun with roasted sweet potato wedges.  Vegetable stew, brown rice (optional), salad Choose whole grains most often:  Whole grain bread, brown rice, quinoa, bulgar wheat, barley, etc. Choose plenty of non starchy vegetables. Eat consistent meals and include a snack in the afternoon particularly on days that you exercise to prevent a low blood sugar. Avoid skipping meals. Does a small bedtime snack (small handful of nuts and small fruit) make any difference in your morning blood sugar. Monitor your weight to be sure you are not continuing to lose.  Check your blood sugar as recommended by your MD. Continue your medication as prescribed.  Please call me if you have any questions.  Follow ups are helpful for many.  If I do not see you sooner, I would like to see you in six months to a year.  It was good to meet you and your wife!

## 2020-03-12 NOTE — Telephone Encounter (Signed)
Glimepiride sent.  Please have patient check with cardiology prior to restarting sildenafil.

## 2020-03-17 NOTE — Progress Notes (Signed)
Cardiology Office Note  Date:  03/18/2020   ID:  Luis Wise, DOB 1940-04-21, MRN [id number]  PCP:  Joaquim Nam, MD   Chief Complaint  Patient presents with  . Other    1 month follow up. Meds reviewed verbally with patient.     HPI:  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  PSH:    Past Surgical History:  Procedure Laterality Date  . BACK SURGERY    . CARDIAC CATHETERIZATION     01/19/2020  . CORONARY ARTERY BYPASS GRAFT N/A 01/29/2020   Procedure: CORONARY ARTERY BYPASS GRAFTING (CABG) TIMES FIVE  USING LIMA to LAD; ENDOSCOPIC HARVESTED GREATER SAPHENOUS VEIN: SVG to PDA; SVG to sequenced OM1 & OM2.;  Surgeon: Kerin Perna, MD;  Location: [facility] OR;  Service: Open Heart Surgery;  Laterality: N/A;  . ENDOVEIN HARVEST OF GREATER SAPHENOUS VEIN Bilateral 01/29/2020   Procedure: ENDOVEIN HARVEST OF GREATER SAPHENOUS VEIN;  Surgeon: Kerin Perna, MD;  Location: [facility] OR;  Service: Open Heart Surgery;  Laterality: Bilateral;  . EYE SURGERY  Radial K   1997  . LAMINECTOMY  1993   L5-S1  . LEFT HEART CATH AND CORONARY ANGIOGRAPHY N/A 01/19/2020   Procedure: LEFT HEART CATH AND CORONARY ANGIOGRAPHY;  Surgeon: Iran Ouch, MD;  Location: [facility] INVASIVE CV LAB;  Service: Cardiovascular;  Laterality: N/A;  . MENISCUS REPAIR  1996  . TEE WITHOUT CARDIOVERSION N/A 01/29/2020   Procedure: TRANSESOPHAGEAL ECHOCARDIOGRAM (TEE);  Surgeon: Donata Clay, Theron Arista, MD;  Location: [facility] OR;  Service: Open Heart Surgery;  Laterality: N/A;  . TONSILLECTOMY      Current Outpatient Medications  Medication Sig Dispense Refill  . amiodarone (PACERONE) 200 MG tablet Take 1 tablet (200 mg total) by mouth daily. 90 tablet 3  . amLODipine (NORVASC) 10 MG tablet Take 1 tablet (10 mg total) by mouth daily. 90 tablet 1  . aspirin EC 81 MG tablet Take 1 tablet (81 mg total) by mouth daily. Swallow whole. 150 tablet 2  . atorvastatin (LIPITOR) 80 MG tablet Take 1 tablet (80 mg total) by mouth daily. 90 tablet 3  . carvedilol (COREG) 12.5 MG tablet Take 1 tablet (12.5 mg total) by mouth 2 (two)  times daily. 180 tablet 3  . cholecalciferol (VITAMIN D3) 25 MCG (1000 UNIT) tablet Take 1,000 Units by mouth daily.    . Coenzyme Q10 (COQ10) 100 MG CAPS Take 100 mg by mouth daily.     . dapagliflozin propanediol (FARXIGA) 5 MG TABS tablet Take 1 tablet (5 mg total) by mouth daily before breakfast. 30 tablet 11  . fenofibrate 160 MG tablet Take 1 tablet (160 mg total) by mouth daily. 90 tablet 1  . ferrous sulfate 324 (65 Fe) MG TBEC  Take 1 tablet (325 mg total) by mouth every Monday, Wednesday, and Friday.    Marland Kitchen glimepiride (AMARYL) 4 MG tablet TAKE 1 TABLET BY MOUTH EVERY DAY WITH BREAKFAST 90 tablet 1  . glucose blood (ONETOUCH ULTRA) test strip TEST 1-2x a day 200 strip 3  . lisinopril (ZESTRIL) 20 MG tablet TAKE 1 TABLET BY MOUTH EVERY DAY 90 tablet 1  . lisinopril-hydrochlorothiazide (ZESTORETIC) 20-25 MG tablet Take 1 tablet by mouth daily. 90 tablet 1  . metFORMIN (GLUCOPHAGE) 1000 MG tablet 500mg  in the AM and 1000mg  in the PM    . Misc Natural Products (URINOZINC PO) Take 1 tablet by mouth in the morning and at bedtime.     . Multiple Vitamins-Minerals (PRESERVISION AREDS 2 PO) Take 1 tablet by mouth 2 (two) times daily.     . traZODone (DESYREL) 50 MG tablet TAKE 1 TO 1.5 TABLETS BY MOUTH EVERY NIGHT FOR SLEEP 135 tablet 3   No current facility-administered medications for this visit.     Allergies:   Ozempic (0.25 or 0.5 mg-dose) [semaglutide(0.25 or 0.5mg -dos)] and Amoxil [amoxicillin]   Social History:  The patient  reports that he has never smoked. He has never used smokeless tobacco. He reports previous alcohol use. He reports that he does not use drugs.   Family History:   family history includes Diabetes in his father; Hypertension in his mother.    Review of Systems: Review of Systems  Constitutional: Negative.   HENT: Negative.   Respiratory: Negative.   Cardiovascular: Negative.   Gastrointestinal: Negative.   Musculoskeletal: Negative.   Neurological: Negative.   Psychiatric/Behavioral: Negative.   All other systems reviewed and are negative.  PHYSICAL EXAM: VS:  BP 124/60 (BP Location: Left Arm, Patient Position: Sitting, Cuff Size: Normal)   Pulse 72   Ht 5' 9.5" (1.765 m)   Wt 164 lb (74.4 kg)   SpO2 98%   BMI 23.87 kg/m  , BMI Body mass index is 23.87 kg/m. Constitutional:  oriented to person, place, and time. No distress.  HENT:  Head: Grossly normal Eyes:  no discharge. No  scleral icterus.  Neck: No JVD, no carotid bruits  Cardiovascular: Regular rate and rhythm, 1/6 systolic ejection murmur left sternal border Pulmonary/Chest: Clear to auscultation bilaterally, no wheezes or rails Abdominal: Soft.  no distension.  no tenderness.  Musculoskeletal: Normal range of motion Neurological:  normal muscle tone. Coordination normal. No atrophy Skin: Skin warm and dry Psychiatric: normal affect, pleasant   Recent Labs: 01/30/2020: Magnesium 2.3 02/27/2020: ALT 20; Hemoglobin 12.9; Platelets 263.0 03/06/2020: BUN 22; Creatinine, Ser 1.25; Potassium 3.9; Sodium 134    Lipid Panel Lab Results  Component Value Date   CHOL 139 01/02/2020   HDL 25.10 (L) 01/02/2020   LDLCALC 77 06/09/2017   TRIG 245.0 (H) 01/02/2020    Wt Readings from Last 3 Encounters:  03/18/20 164 lb (74.4 kg)  03/11/20 166 lb (75.3 kg)  03/11/20 166 lb (75.3  kg)     ASSESSMENT AND PLAN:  Problem List Items Addressed This Visit      Cardiology Problems   Atrial fibrillation (HCC)   Relevant Orders   EKG 12-Lead     Other   S/P CABG x 5   Relevant Orders   EKG 12-Lead    Other Visit Diagnoses    Coronary artery disease of native artery of native heart with stable angina pectoris (HCC)    -  Primary   Relevant Orders   EKG 12-Lead   Type 2 diabetes mellitus without complication, without long-term current use of insulin (HCC)       Hyperlipidemia LDL goal <70       Essential hypertension         CAD with stable angina CABG x5, November 2021 Continue aspirin, statin, beta-blocker, ACE Referral to cardiac rehab He is exercising several times a day walking 1 to 2 miles at a time with no anginal symptoms  Hyperlipidemia Lipitor 80, goal LDL less than 70  Essential hypertension Blood pressure is well controlled on today's visit. No changes made to the medications.  Diabetes type 2 with complications Followed by endocrinology We have encouraged continued exercise,  careful diet management in an effort to lose weight.    Total encounter time more than 25 minutes  Greater than 50% was spent in counseling and coordination of care with the patient    Signed, Dossie Arbour, M.D., Ph.D. [facility] [place], [place] [phone]

## 2020-03-18 ENCOUNTER — Encounter: Payer: Medicare Other | Attending: Cardiovascular Disease

## 2020-03-18 ENCOUNTER — Other Ambulatory Visit: Payer: Self-pay

## 2020-03-18 ENCOUNTER — Encounter: Payer: Self-pay | Admitting: Cardiovascular Disease

## 2020-03-18 ENCOUNTER — Ambulatory Visit (INDEPENDENT_AMBULATORY_CARE_PROVIDER_SITE_OTHER): Payer: Medicare Other | Admitting: Cardiovascular Disease

## 2020-03-18 VITALS — BP 124/60 | HR 72 | Ht 69.5 in | Wt 164.0 lb

## 2020-03-18 DIAGNOSIS — I25118 Atherosclerotic heart disease of native coronary artery with other forms of angina pectoris: Secondary | ICD-10-CM

## 2020-03-18 DIAGNOSIS — I10 Essential (primary) hypertension: Secondary | ICD-10-CM

## 2020-03-18 DIAGNOSIS — Z951 Presence of aortocoronary bypass graft: Secondary | ICD-10-CM | POA: Insufficient documentation

## 2020-03-18 DIAGNOSIS — E785 Hyperlipidemia, unspecified: Secondary | ICD-10-CM

## 2020-03-18 DIAGNOSIS — I4819 Other persistent atrial fibrillation: Secondary | ICD-10-CM

## 2020-03-18 DIAGNOSIS — E119 Type 2 diabetes mellitus without complications: Secondary | ICD-10-CM

## 2020-03-18 NOTE — Progress Notes (Signed)
Virtual Visit completed. Patient informed on EP and RD appointment and 6 Minute walk test. Patient also informed of patient health questionnaires on My Chart. Patient Verbalizes understanding. Visit diagnosis can be found in CH[facility]11/19/2021. 

## 2020-03-18 NOTE — Patient Instructions (Signed)
Medication Instructions:  No changes  If you need a refill on your cardiac medications before your next appointment, please call your pharmacy.    Lab work: No new labs needed   If you have labs (blood work) drawn today and your tests are completely normal, you will receive your results only by: . MyChart Message (if you have MyChart) OR . A paper copy in the mail If you have any lab test that is abnormal or we need to change your treatment, we will call you to review the results.   Testing/Procedures: No new testing needed   Follow-Up: At [facility], you and your health needs are our priority.  As part of our continuing mission to provide you with exceptional heart care, we have created designated Provider Care Teams.  These Care Teams include your primary Cardiologist (physician) and Advanced Practice Providers (APPs -  Physician Assistants and Nurse Practitioners) who all work together to provide you with the care you need, when you need it.  . You will need a follow up appointment in 3 months  . Providers on your designated Care Team:   . Christopher Berge, NP . Ryan Dunn, PA-C . Jacquelyn Visser, PA-C  Any Other Special Instructions Will Be Listed Below (If Applicable).  COVID-19 Vaccine Information can be found at: [email or web address] For questions related to vaccine distribution or appointments, please email [email or web address] or call [phone].     

## 2020-03-19 ENCOUNTER — Encounter: Payer: Medicare Other | Admitting: *Deleted

## 2020-03-19 VITALS — Ht 70.4 in | Wt 162.4 lb

## 2020-03-19 DIAGNOSIS — Z951 Presence of aortocoronary bypass graft: Secondary | ICD-10-CM | POA: Diagnosis not present

## 2020-03-19 NOTE — Patient Instructions (Signed)
Patient Instructions  Patient Details  Name: Luis Wise MRN: [id number] Date of Birth: 08/16/1940 Referring Provider:  Iran Ouch, MD  Below are your personal goals for exercise, nutrition, and risk factors. Our goal is to help you stay on track towards obtaining and maintaining these goals. We will be discussing your progress on these goals with you throughout the program.  Initial Exercise Prescription:  Initial Exercise Prescription - 03/19/20 1000      Date of Initial Exercise RX and Referring Provider   Date 03/19/20    Referring Provider Lorine Bears MD      Treadmill   MPH 2.7    Grade 1    Minutes 15    METs 3.44      Recumbant Bike   Level 2    RPM 50    Watts 27    Minutes 15    METs 3.1      NuStep   Level 3    SPM 80    Minutes 15    METs 3.1      Recumbant Elliptical   Level 2    RPM 50    Minutes 15    METs 3      Elliptical   Level 1    Speed 2.9    Minutes 15      Prescription Details   Frequency (times per week) 3    Duration Progress to 30 minutes of continuous aerobic without signs/symptoms of physical distress      Intensity   THRR 40-80% of Max Heartrate 102-129    Ratings of Perceived Exertion 11-13    Perceived Dyspnea 0-4      Progression   Progression Continue to progress workloads to maintain intensity without signs/symptoms of physical distress.      Resistance Training   Weight 4 lb    Reps 10-15           Exercise Goals: Frequency: Be able to perform aerobic exercise two to three times per week in program working toward 2-5 days per week of home exercise.  Intensity: Work with a perceived exertion of 11 (fairly light) - 15 (hard) while following your exercise prescription.  We will make changes to your prescription with you as you progress through the program.   Duration: Be able to do 30 to 45 minutes of continuous aerobic exercise in addition to a 5 minute warm-up and a 5 minute cool-down routine.    Nutrition Goals: Your personal nutrition goals will be established when you do your nutrition analysis with the dietician.  The following are general nutrition guidelines to follow: Cholesterol < 200mg /day Sodium < 1500mg /day Fiber: Men over 50 yrs - 30 grams per day  Personal Goals:  Personal Goals and Risk Factors at Admission - 03/19/20 1029      Core Components/Risk Factors/Patient Goals on Admission    Weight Management Yes;Weight Maintenance    Intervention Weight Management: Develop a combined nutrition and exercise program designed to reach desired caloric intake, while maintaining appropriate intake of nutrient and fiber, sodium and fats, and appropriate energy expenditure required for the weight goal.;Weight Management: Provide education and appropriate resources to help participant work on and attain dietary goals.;Weight Management/Obesity: Establish reasonable short term and long term weight goals.    Admit Weight 162 lb 6.4 oz (73.7 kg)    Goal Weight: Short Term 164 lb (74.4 kg)    Goal Weight: Long Term 164 lb (74.4 kg)  Expected Outcomes Short Term: Continue to assess and modify interventions until short term weight is achieved;Long Term: Adherence to nutrition and physical activity/exercise program aimed toward attainment of established weight goal;Weight Maintenance: Understanding of the daily nutrition guidelines, which includes 25-35% calories from fat, 7% or less cal from saturated fats, less than 200mg  cholesterol, less than 1.5gm of sodium, & 5 or more servings of fruits and vegetables daily    Diabetes Yes    Intervention Provide education about signs/symptoms and action to take for hypo/hyperglycemia.;Provide education about proper nutrition, including hydration, and aerobic/resistive exercise prescription along with prescribed medications to achieve blood glucose in normal ranges: Fasting glucose 65-99 mg/dL    Expected Outcomes Short Term: Participant verbalizes  understanding of the signs/symptoms and immediate care of hyper/hypoglycemia, proper foot care and importance of medication, aerobic/resistive exercise and nutrition plan for blood glucose control.;Long Term: Attainment of HbA1C < 7%.    Hypertension Yes    Intervention Provide education on lifestyle modifcations including regular physical activity/exercise, weight management, moderate sodium restriction and increased consumption of fresh fruit, vegetables, and low fat dairy, alcohol moderation, and smoking cessation.;Monitor prescription use compliance.    Expected Outcomes Short Term: Continued assessment and intervention until BP is < 140/30mm HG in hypertensive participants. < 130/74mm HG in hypertensive participants with diabetes, heart failure or chronic kidney disease.;Long Term: Maintenance of blood pressure at goal levels.    Lipids Yes    Intervention Provide education and support for participant on nutrition & aerobic/resistive exercise along with prescribed medications to achieve LDL 70mg , HDL >40mg .    Expected Outcomes Short Term: Participant states understanding of desired cholesterol values and is compliant with medications prescribed. Participant is following exercise prescription and nutrition guidelines.;Long Term: Cholesterol controlled with medications as prescribed, with individualized exercise RX and with personalized nutrition plan. Value goals: LDL < 70mg , HDL > 40 mg.           Tobacco Use Initial Evaluation: Social History   Tobacco Use  Smoking Status Never Smoker  Smokeless Tobacco Never Used    Exercise Goals and Review:  Exercise Goals    Row Name 03/19/20 1021             Exercise Goals   Increase Physical Activity Yes       Intervention Provide advice, education, support and counseling about physical activity/exercise needs.;Develop an individualized exercise prescription for aerobic and resistive training based on initial evaluation findings, risk  stratification, comorbidities and participant's personal goals.       Expected Outcomes Short Term: Attend rehab on a regular basis to increase amount of physical activity.;Long Term: Add in home exercise to make exercise part of routine and to increase amount of physical activity.;Long Term: Exercising regularly at least 3-5 days a week.       Increase Strength and Stamina Yes       Intervention Provide advice, education, support and counseling about physical activity/exercise needs.;Develop an individualized exercise prescription for aerobic and resistive training based on initial evaluation findings, risk stratification, comorbidities and participant's personal goals.       Expected Outcomes Short Term: Perform resistance training exercises routinely during rehab and add in resistance training at home;Short Term: Increase workloads from initial exercise prescription for resistance, speed, and METs.;Long Term: Improve cardiorespiratory fitness, muscular endurance and strength as measured by increased METs and functional capacity ( )       Able to understand and use rate of perceived exertion (RPE) scale Yes  Intervention Provide education and explanation on how to use RPE scale       Expected Outcomes Short Term: Able to use RPE daily in rehab to express subjective intensity level;Long Term:  Able to use RPE to guide intensity level when exercising independently       Able to understand and use Dyspnea scale Yes       Intervention Provide education and explanation on how to use Dyspnea scale       Expected Outcomes Short Term: Able to use Dyspnea scale daily in rehab to express subjective sense of shortness of breath during exertion;Long Term: Able to use Dyspnea scale to guide intensity level when exercising independently       Knowledge and understanding of Target Heart Rate Range (THRR) Yes       Intervention Provide education and explanation of THRR including how the numbers were predicted  and where they are located for reference       Expected Outcomes Short Term: Able to state/look up THRR;Short Term: Able to use daily as guideline for intensity in rehab;Long Term: Able to use THRR to govern intensity when exercising independently       Able to check pulse independently Yes       Intervention Provide education and demonstration on how to check pulse in carotid and radial arteries.;Review the importance of being able to check your own pulse for safety during independent exercise       Expected Outcomes Short Term: Able to explain why pulse checking is important during independent exercise;Long Term: Able to check pulse independently and accurately       Understanding of Exercise Prescription Yes       Intervention Provide education, explanation, and written materials on patient's individual exercise prescription       Expected Outcomes Short Term: Able to explain program exercise prescription;Long Term: Able to explain home exercise prescription to exercise independently              Copy of goals given to participant.

## 2020-03-19 NOTE — Progress Notes (Signed)
Cardiac Individual Treatment Plan  Patient Details  Name: Luis Wise MRN: [id number] Date of Birth: 1940-07-27 Referring Provider:   Flowsheet Row Cardiac Rehab from 03/19/2020 in [facility] Cardiac and Pulmonary Rehab  Referring Provider [name] MD      Initial Encounter Date:  Flowsheet Row Cardiac Rehab from 03/19/2020 in [facility] Cardiac and Pulmonary Rehab  Date 03/19/20      Visit Diagnosis: S/P CABG x 5  Patient's Home Medications on Admission:  Current Outpatient Medications:  .  amiodarone (PACERONE) 200 MG tablet, Take 1 tablet (200 mg total) by mouth daily., Disp: 90 tablet, Rfl: 3 .  amLODipine (NORVASC) 10 MG tablet, Take 1 tablet (10 mg total) by mouth daily., Disp: 90 tablet, Rfl: 1 .  aspirin EC 81 MG tablet, Take 1 tablet (81 mg total) by mouth daily. Swallow whole., Disp: 150 tablet, Rfl: 2 .  atorvastatin (LIPITOR) 80 MG tablet, Take 1 tablet (80 mg total) by mouth daily., Disp: 90 tablet, Rfl: 3 .  carvedilol (COREG) 12.5 MG tablet, Take 1 tablet (12.5 mg total) by mouth 2 (two) times daily., Disp: 180 tablet, Rfl: 3 .  cholecalciferol (VITAMIN D3) 25 MCG (1000 UNIT) tablet, Take 1,000 Units by mouth daily., Disp: , Rfl:  .  Coenzyme Q10 (COQ10) 100 MG CAPS, Take 100 mg by mouth daily. , Disp: , Rfl:  .  dapagliflozin propanediol (FARXIGA) 5 MG TABS tablet, Take 1 tablet (5 mg total) by mouth daily before breakfast., Disp: 30 tablet, Rfl: 11 .  fenofibrate 160 MG tablet, Take 1 tablet (160 mg total) by mouth daily., Disp: 90 tablet, Rfl: 1 .  ferrous sulfate 324 (65 Fe) MG TBEC, Take 1 tablet (325 mg total) by mouth every Monday, Wednesday, and Friday., Disp: , Rfl:  .  glimepiride (AMARYL) 4 MG tablet, TAKE 1 TABLET BY MOUTH EVERY DAY WITH BREAKFAST, Disp: 90 tablet, Rfl: 1 .  glucose blood (ONETOUCH ULTRA) test strip, TEST 1-2x a day, Disp: 200 strip, Rfl: 3 .  lisinopril (ZESTRIL) 20 MG tablet, TAKE 1 TABLET BY MOUTH EVERY DAY, Disp: 90 tablet, Rfl: 1 .   lisinopril-hydrochlorothiazide (ZESTORETIC) 20-25 MG tablet, Take 1 tablet by mouth daily., Disp: 90 tablet, Rfl: 1 .  metFORMIN (GLUCOPHAGE) 1000 MG tablet, $RemoveBe'500mg'nILfLpHbr$  in the AM and $Remo'1000mg'cuunt$  in the PM, Disp: , Rfl:  .  Misc Natural Products (URINOZINC PO), Take 1 tablet by mouth in the morning and at bedtime. , Disp: , Rfl:  .  Multiple Vitamins-Minerals (PRESERVISION AREDS 2 PO), Take 1 tablet by mouth 2 (two) times daily. , Disp: , Rfl:  .  traZODone (DESYREL) 50 MG tablet, TAKE 1 TO 1.5 TABLETS BY MOUTH EVERY NIGHT FOR SLEEP, Disp: 135 tablet, Rfl: 3  Past Medical History: Past Medical History:  Diagnosis Date  . Allergy    Amoxicillin  . Cataract 2017   Mild progression  . Coronary artery disease    a. s/p 5-vessel CABG (LIMA-LAD, SVG-diag, sequential SVG-OM1-OM 2, & SVG-PDA)  . Diabetes mellitus with complication ([place])   . Hyperlipidemia   . Hypertension   . Insomnia   . neuropathy    bilateral feet  . Postoperative atrial fibrillation (HCC)     Tobacco Use: Social History   Tobacco Use  Smoking Status Never Smoker  Smokeless Tobacco Never Used    Labs: Recent Review Flowsheet Data    Labs for ITP Cardiac and Pulmonary Rehab Latest Ref Rng & Units 01/29/2020 01/29/2020 01/29/2020 01/31/2020 03/11/2020  Cholestrol 0 - 200 mg/dL - - - - -   LDLCALC 0 - 99 mg/dL - - - - -   LDLDIRECT mg/dL - - - - -   HDL >39.00 mg/dL - - - - -   Trlycerides 0.0 - 149.0 mg/dL - - - - -   Hemoglobin A1c 4.0 - 5.6 % - - - - 6.9(A)   PHART 7.350 - 7.450 7.369 7.541(H) 7.386 - -   PCO2ART 32.0 - 48.0 mmHg 43.8 22.8(L) 38.0 - -   HCO3 20.0 - 28.0 mmol/L 25.5 19.6(L) 22.7 - -   TCO2 22 - 32 mmol/L 27 20(L) 24 - -   ACIDBASEDEF 0.0 - 2.0 mmol/L - 2.0 2.0 - -   O2SAT % 99.0 98.0 90.0 73.7 -       Exercise Target Goals: Exercise Program Goal: Individual exercise prescription set using results from initial 6 min walk test and THRR while considering  patient's activity barriers and safety.    Exercise Prescription Goal: Initial exercise prescription builds to 30-45 minutes a day of aerobic activity, 2-3 days per week.  Home exercise guidelines will be given to patient during program as part of exercise prescription that the participant will acknowledge.   Education: Aerobic Exercise: - Group verbal and visual presentation on the components of exercise prescription. Introduces F.I.T.T principle from ACSM for exercise prescriptions.  Reviews F.I.T.T. principles of aerobic exercise including progression. Written material given at graduation.   Education: Resistance Exercise: - Group verbal and visual presentation on the components of exercise prescription. Introduces F.I.T.T principle from ACSM for exercise prescriptions  Reviews F.I.T.T. principles of resistance exercise including progression. Written material given at graduation.    Education: Exercise & Equipment Safety: - Individual verbal instruction and demonstration of equipment use and safety with use of the equipment. Flowsheet Row Cardiac Rehab from 03/19/2020 in [facility] Cardiac and Pulmonary Rehab  Date 03/18/20  Educator [facility]  Instruction Review Code 1- Verbalizes Understanding      Education: Exercise Physiology & General Exercise Guidelines: - Group verbal and written instruction with models to review the exercise physiology of the cardiovascular system and associated critical values. Provides general exercise guidelines with specific guidelines to those with heart or lung disease.    Education: Flexibility, Balance, Mind/Body Relaxation: - Group verbal and visual presentation with interactive activity on the components of exercise prescription. Introduces F.I.T.T principle from ACSM for exercise prescriptions. Reviews F.I.T.T. principles of flexibility and balance exercise training including progression. Also discusses the mind body connection.  Reviews various relaxation techniques to help reduce and manage stress  (i.e. Deep breathing, progressive muscle relaxation, and visualization). Balance handout provided to take home. Written material given at graduation.   Activity Barriers & Risk Stratification:  Activity Barriers & Cardiac Risk Stratification - 03/19/20 1018      Activity Barriers & Cardiac Risk Stratification   Activity Barriers None    Cardiac Risk Stratification High           6 Minute Walk:  6 Minute Walk    Row Name 03/19/20 1018         6 Minute Walk   Phase Initial     Distance 1540 feet     Walk Time 6 minutes     # of Rest Breaks 0     MPH 2.92     METS 3.17     RPE 11     VO2 Peak 11.1     Symptoms No  Resting HR 75 bpm     Resting BP 124/74     Resting Oxygen Saturation  99 %     Exercise Oxygen Saturation  during 6 min walk 99 %     Max Ex. HR 86 bpm     Max Ex. BP 136/60     2 Minute Post BP 126/58            Oxygen Initial Assessment:   Oxygen Re-Evaluation:   Oxygen Discharge (Final Oxygen Re-Evaluation):   Initial Exercise Prescription:  Initial Exercise Prescription - 03/19/20 1000      Date of Initial Exercise RX and Referring Provider   Date 03/19/20    Referring Provider [name] MD      Treadmill   MPH 2.7    Grade 1    Minutes 15    METs 3.44      Recumbant Bike   Level 2    RPM 50    Watts 27    Minutes 15    METs 3.1      NuStep   Level 3    SPM 80    Minutes 15    METs 3.1      Recumbant Elliptical   Level 2    RPM 50    Minutes 15    METs 3      Elliptical   Level 1    Speed 2.9    Minutes 15      Prescription Details   Frequency (times per week) 3    Duration Progress to 30 minutes of continuous aerobic without signs/symptoms of physical distress      Intensity   THRR 40-80% of Max Heartrate 102-129    Ratings of Perceived Exertion 11-13    Perceived Dyspnea 0-4      Progression   Progression Continue to progress workloads to maintain intensity without signs/symptoms of physical  distress.      Resistance Training   Weight 4 lb    Reps 10-15           Perform Capillary Blood Glucose checks as needed.  Exercise Prescription Changes:  Exercise Prescription Changes    Row Name 03/19/20 1000             Response to Exercise   Blood Pressure (Admit) 124/74       Blood Pressure (Exercise) 136/60       Blood Pressure (Exit) 126/58       Heart Rate (Admit) 75 bpm       Heart Rate (Exercise) 86 bpm       Heart Rate (Exit) 75 bpm       Oxygen Saturation (Admit) 99 %       Oxygen Saturation (Exercise) 99 %       Rating of Perceived Exertion (Exercise) 11       Symptoms none       Comments walk test results              Exercise Comments:   Exercise Goals and Review:  Exercise Goals    Row Name 03/19/20 1021             Exercise Goals   Increase Physical Activity Yes       Intervention Provide advice, education, support and counseling about physical activity/exercise needs.;Develop an individualized exercise prescription for aerobic and resistive training based on initial evaluation findings, risk stratification, comorbidities and participant's personal goals.  Expected Outcomes Short Term: Attend rehab on a regular basis to increase amount of physical activity.;Long Term: Add in home exercise to make exercise part of routine and to increase amount of physical activity.;Long Term: Exercising regularly at least 3-5 days a week.       Increase Strength and Stamina Yes       Intervention Provide advice, education, support and counseling about physical activity/exercise needs.;Develop an individualized exercise prescription for aerobic and resistive training based on initial evaluation findings, risk stratification, comorbidities and participant's personal goals.       Expected Outcomes Short Term: Perform resistance training exercises routinely during rehab and add in resistance training at home;Short Term: Increase workloads from initial exercise  prescription for resistance, speed, and METs.;Long Term: Improve cardiorespiratory fitness, muscular endurance and strength as measured by increased METs and functional capacity (6MWT)       Able to understand and use rate of perceived exertion (RPE) scale Yes       Intervention Provide education and explanation on how to use RPE scale       Expected Outcomes Short Term: Able to use RPE daily in rehab to express subjective intensity level;Long Term:  Able to use RPE to guide intensity level when exercising independently       Able to understand and use Dyspnea scale Yes       Intervention Provide education and explanation on how to use Dyspnea scale       Expected Outcomes Short Term: Able to use Dyspnea scale daily in rehab to express subjective sense of shortness of breath during exertion;Long Term: Able to use Dyspnea scale to guide intensity level when exercising independently       Knowledge and understanding of Target Heart Rate Range (THRR) Yes       Intervention Provide education and explanation of THRR including how the numbers were predicted and where they are located for reference       Expected Outcomes Short Term: Able to state/look up THRR;Short Term: Able to use daily as guideline for intensity in rehab;Long Term: Able to use THRR to govern intensity when exercising independently       Able to check pulse independently Yes       Intervention Provide education and demonstration on how to check pulse in carotid and radial arteries.;Review the importance of being able to check your own pulse for safety during independent exercise       Expected Outcomes Short Term: Able to explain why pulse checking is important during independent exercise;Long Term: Able to check pulse independently and accurately       Understanding of Exercise Prescription Yes       Intervention Provide education, explanation, and written materials on patient's individual exercise prescription       Expected Outcomes  Short Term: Able to explain program exercise prescription;Long Term: Able to explain home exercise prescription to exercise independently              Exercise Goals Re-Evaluation :   Discharge Exercise Prescription (Final Exercise Prescription Changes):  Exercise Prescription Changes - 03/19/20 1000      Response to Exercise   Blood Pressure (Admit) 124/74    Blood Pressure (Exercise) 136/60    Blood Pressure (Exit) 126/58    Heart Rate (Admit) 75 bpm    Heart Rate (Exercise) 86 bpm    Heart Rate (Exit) 75 bpm    Oxygen Saturation (Admit) 99 %    Oxygen Saturation (Exercise) 99 %  Rating of Perceived Exertion (Exercise) 11    Symptoms none    Comments walk test results           Nutrition:  Target Goals: Understanding of nutrition guidelines, daily intake of sodium '1500mg'$ , cholesterol '200mg'$ , calories 30% from fat and 7% or less from saturated fats, daily to have 5 or more servings of fruits and vegetables.  Education: All About Nutrition: -Group instruction provided by verbal, written material, interactive activities, discussions, models, and posters to present general guidelines for heart healthy nutrition including fat, fiber, MyPlate, the role of sodium in heart healthy nutrition, utilization of the nutrition label, and utilization of this knowledge for meal planning. Follow up email sent as well. Written material given at graduation. Flowsheet Row Cardiac Rehab from 03/19/2020 in [facility] Cardiac and Pulmonary Rehab  Education need identified 03/19/20      Biometrics:  Pre Biometrics - 03/19/20 1022      Pre Biometrics   Height 5' 10.4" (1.788 m)    Weight 162 lb 6.4 oz (73.7 kg)    BMI (Calculated) 23.04    Single Leg Stand 30 seconds            Nutrition Therapy Plan and Nutrition Goals:  Nutrition Therapy & Goals - 03/19/20 0947      Personal Nutrition Goals   Comments A1C: 6.9 - metformin, farciga. Down about 10 lbs. B: 1 egg with toast (whole wheat)  or cereal (oatmeal - rolled oats) with brown sugar and walnuts. Decaf cooffee with almond milk sweetener. L: Warden/ranger - grilled flounder with salad and vegetable or leftovers or lean cuisine "jazzed up" S: apple or peanuts or crackers with peanut butter D: light supper: favorite is shrimp salad, chicken, fish, brown rice, vegetables (broccoli, green beans, beans), bean soup. uses olive oil and canola oil. "50% salt" seasoning and mrs dash (low salt). Drinks:unsweet tea with slenda, fresca. Met with RD at [facility].           Nutrition Assessments:  MEDIFICTS Score Key:  ?70 Need to make dietary changes   40-70 Heart Healthy Diet  ? 40 Therapeutic Level Cholesterol Diet   Picture Your Plate Scores:  <25 Unhealthy dietary pattern with much room for improvement.  41-50 Dietary pattern unlikely to meet recommendations for good health and room for improvement.  51-60 More healthful dietary pattern, with some room for improvement.   >60 Healthy dietary pattern, although there may be some specific behaviors that could be improved.    Nutrition Goals Re-Evaluation:   Nutrition Goals Discharge (Final Nutrition Goals Re-Evaluation):   Psychosocial: Target Goals: Acknowledge presence or absence of significant depression and/or stress, maximize coping skills, provide positive support system. Participant is able to verbalize types and ability to use techniques and skills needed for reducing stress and depression.   Education: Stress, Anxiety, and Depression - Group verbal and visual presentation to define topics covered.  Reviews how body is impacted by stress, anxiety, and depression.  Also discusses healthy ways to reduce stress and to treat/manage anxiety and depression.  Written material given at graduation.   Education: Sleep Hygiene -Provides group verbal and written instruction about how sleep can affect your health.  Define sleep hygiene, discuss sleep cycles and impact of  sleep habits. Review good sleep hygiene tips.    Initial Review & Psychosocial Screening:  Initial Psych Review & Screening - 03/18/20 1329      Initial Review   Current issues with None Identified  Family Dynamics   Good Support System? Yes    Comments He can look to his wife and his son that lives close by. Sayyid has a positive outlook on his health since he has got a good report form the doctor.      Barriers   Psychosocial barriers to participate in program The patient should benefit from training in stress management and relaxation.;There are no identifiable barriers or psychosocial needs.      Screening Interventions   Interventions Encouraged to exercise;To provide support and resources with identified psychosocial needs;Provide feedback about the scores to participant    Expected Outcomes Short Term goal: Utilizing psychosocial counselor, staff and physician to assist with identification of specific Stressors or current issues interfering with healing process. Setting desired goal for each stressor or current issue identified.;Long Term Goal: Stressors or current issues are controlled or eliminated.;Short Term goal: Identification and review with participant of any Quality of Life or Depression concerns found by scoring the questionnaire.;Long Term goal: The participant improves quality of Life and PHQ9 Scores as seen by post scores and/or verbalization of changes           Quality of Life Scores:   Quality of Life - 03/19/20 1022      Quality of Life   Select Quality of Life      Quality of Life Scores   Health/Function Pre 23.17 %    Socioeconomic Pre 24 %    Psych/Spiritual Pre 24 %    Family Pre 28.8 %    GLOBAL Pre 24.33 %          Scores of 19 and below usually indicate a poorer quality of life in these areas.  A difference of  2-3 points is a clinically meaningful difference.  A difference of 2-3 points in the total score of the Quality of Life Index has been  associated with significant improvement in overall quality of life, self-image, physical symptoms, and general health in studies assessing change in quality of life.  PHQ-9: Recent Review Flowsheet Data    Depression screen [facility] 2/9 03/19/2020 03/11/2020 01/11/2020 06/13/2018 06/09/2017   Decreased Interest 0 0 0 0 0   Down, Depressed, Hopeless 0 0 0 0 0   PHQ - 2 Score 0 0 0 0 0   Altered sleeping 0 - - - -   Tired, decreased energy 0 - - - -   Change in appetite 0 - - - -   Feeling bad or failure about yourself  0 - - - -   Trouble concentrating 0 - - - -   Moving slowly or fidgety/restless 0 - - - -   Suicidal thoughts 0 - - - -   PHQ-9 Score 0 - - - -   Difficult doing work/chores Not difficult at all - - - -     Interpretation of Total Score  Total Score Depression Severity:  1-4 = Minimal depression, 5-9 = Mild depression, 10-14 = Moderate depression, 15-19 = Moderately severe depression, 20-27 = Severe depression   Psychosocial Evaluation and Intervention:  Psychosocial Evaluation - 03/18/20 1330      Psychosocial Evaluation & Interventions   Interventions Encouraged to exercise with the program and follow exercise prescription    Comments He can look to his wife and his son that lives close by. Thien has a positive outlook on his health since he has got a good report form the doctor.    Expected Outcomes Short: Exercise  regularly to support mental health and notify staff of any changes. Long: maintain mental health and well being through teaching of rehab or prescribed medications independently.    Continue Psychosocial Services  Follow up required by staff           Psychosocial Re-Evaluation:   Psychosocial Discharge (Final Psychosocial Re-Evaluation):   Vocational Rehabilitation: Provide vocational rehab assistance to qualifying candidates.   Vocational Rehab Evaluation & Intervention:   Education: Education Goals: Education classes will be provided on a variety of  topics geared toward better understanding of heart health and risk factor modification. Participant will state understanding/return demonstration of topics presented as noted by education test scores.  Learning Barriers/Preferences:  Learning Barriers/Preferences - 03/18/20 1328      Learning Barriers/Preferences   Learning Barriers None    Learning Preferences None           General Cardiac Education Topics:  AED/CPR: - Group verbal and written instruction with the use of models to demonstrate the basic use of the AED with the basic ABC's of resuscitation.   Anatomy and Cardiac Procedures: - Group verbal and visual presentation and models provide information about basic cardiac anatomy and function. Reviews the testing methods done to diagnose heart disease and the outcomes of the test results. Describes the treatment choices: Medical Management, Angioplasty, or Coronary Bypass Surgery for treating various heart conditions including Myocardial Infarction, Angina, Valve Disease, and Cardiac Arrhythmias.  Written material given at graduation. Flowsheet Row Cardiac Rehab from 03/19/2020 in [facility] Cardiac and Pulmonary Rehab  Education need identified 03/19/20      Medication Safety: - Group verbal and visual instruction to review commonly prescribed medications for heart and lung disease. Reviews the medication, class of the drug, and side effects. Includes the steps to properly store meds and maintain the prescription regimen.  Written material given at graduation.   Intimacy: - Group verbal instruction through game format to discuss how heart and lung disease can affect sexual intimacy. Written material given at graduation..   Know Your Numbers and Heart Failure: - Group verbal and visual instruction to discuss disease risk factors for cardiac and pulmonary disease and treatment options.  Reviews associated critical values for Overweight/Obesity, Hypertension, Cholesterol, and  Diabetes.  Discusses basics of heart failure: signs/symptoms and treatments.  Introduces Heart Failure Zone chart for action plan for heart failure.  Written material given at graduation.   Infection Prevention: - Provides verbal and written material to individual with discussion of infection control including proper hand washing and proper equipment cleaning during exercise session. Flowsheet Row Cardiac Rehab from 03/19/2020 in [facility] Cardiac and Pulmonary Rehab  Date 03/18/20  Educator [facility]  Instruction Review Code 1- Verbalizes Understanding      Falls Prevention: - Provides verbal and written material to individual with discussion of falls prevention and safety. Flowsheet Row Cardiac Rehab from 03/19/2020 in [facility] Cardiac and Pulmonary Rehab  Date 03/18/20  Educator [facility]  Instruction Review Code 1- Verbalizes Understanding      Other: -Provides group and verbal instruction on various topics (see comments)   Knowledge Questionnaire Score:  Knowledge Questionnaire Score - 03/19/20 1022      Knowledge Questionnaire Score   Pre Score 24/26 Education Focus: MI, nutrition           Core Components/Risk Factors/Patient Goals at Admission:  Personal Goals and Risk Factors at Admission - 03/19/20 1029      Core Components/Risk Factors/Patient Goals on Admission    Weight  Management Yes;Weight Maintenance    Intervention Weight Management: Develop a combined nutrition and exercise program designed to reach desired caloric intake, while maintaining appropriate intake of nutrient and fiber, sodium and fats, and appropriate energy expenditure required for the weight goal.;Weight Management: Provide education and appropriate resources to help participant work on and attain dietary goals.;Weight Management/Obesity: Establish reasonable short term and long term weight goals.    Admit Weight 162 lb 6.4 oz (73.7 kg)    Goal Weight: Short Term 164 lb (74.4 kg)    Goal Weight: Long Term 164 lb  (74.4 kg)    Expected Outcomes Short Term: Continue to assess and modify interventions until short term weight is achieved;Long Term: Adherence to nutrition and physical activity/exercise program aimed toward attainment of established weight goal;Weight Maintenance: Understanding of the daily nutrition guidelines, which includes 25-35% calories from fat, 7% or less cal from saturated fats, less than $RemoveB'200mg'RUwTOzsz$  cholesterol, less than 1.5gm of sodium, & 5 or more servings of fruits and vegetables daily    Diabetes Yes    Intervention Provide education about signs/symptoms and action to take for hypo/hyperglycemia.;Provide education about proper nutrition, including hydration, and aerobic/resistive exercise prescription along with prescribed medications to achieve blood glucose in normal ranges: Fasting glucose 65-99 mg/dL    Expected Outcomes Short Term: Participant verbalizes understanding of the signs/symptoms and immediate care of hyper/hypoglycemia, proper foot care and importance of medication, aerobic/resistive exercise and nutrition plan for blood glucose control.;Long Term: Attainment of HbA1C < 7%.    Hypertension Yes    Intervention Provide education on lifestyle modifcations including regular physical activity/exercise, weight management, moderate sodium restriction and increased consumption of fresh fruit, vegetables, and low fat dairy, alcohol moderation, and smoking cessation.;Monitor prescription use compliance.    Expected Outcomes Short Term: Continued assessment and intervention until BP is < 140/28mm HG in hypertensive participants. < 130/50mm HG in hypertensive participants with diabetes, heart failure or chronic kidney disease.;Long Term: Maintenance of blood pressure at goal levels.    Lipids Yes    Intervention Provide education and support for participant on nutrition & aerobic/resistive exercise along with prescribed medications to achieve LDL '70mg'$ , HDL >$Remo'40mg'RAjuP$ .    Expected Outcomes Short  Term: Participant states understanding of desired cholesterol values and is compliant with medications prescribed. Participant is following exercise prescription and nutrition guidelines.;Long Term: Cholesterol controlled with medications as prescribed, with individualized exercise RX and with personalized nutrition plan. Value goals: LDL < $Rem'70mg'dHHf$ , HDL > 40 mg.           Education:Diabetes - Individual verbal and written instruction to review signs/symptoms of diabetes, desired ranges of glucose level fasting, after meals and with exercise. Acknowledge that pre and post exercise glucose checks will be done for 3 sessions at entry of program. [place] from 03/19/2020 in [facility] Cardiac and Pulmonary Rehab  Date 03/18/20  Educator [facility]  Instruction Review Code 1- Verbalizes Understanding      Core Components/Risk Factors/Patient Goals Review:    Core Components/Risk Factors/Patient Goals at Discharge (Final Review):    ITP Comments:  ITP Comments    Row Name 03/18/20 1341 03/19/20 1017         ITP Comments Virtual Visit completed. Patient informed on EP and RD appointment and 6 Minute walk test. Patient also informed of patient health questionnaires on My Chart. Patient Verbalizes understanding. Visit diagnosis can be found in [facility] 01/26/2020. Completed 6MWT and gym orientation. Initial ITP created and sent for review to Dr.  Emily Filbert, Medical Director.             Comments: Initial ITP

## 2020-03-22 ENCOUNTER — Other Ambulatory Visit: Payer: Self-pay

## 2020-03-22 DIAGNOSIS — Z951 Presence of aortocoronary bypass graft: Secondary | ICD-10-CM | POA: Diagnosis not present

## 2020-03-22 LAB — GLUCOSE, CAPILLARY
Glucose-Capillary: 221 mg/dL — ABNORMAL HIGH (ref 70–99)
Glucose-Capillary: 152 mg/dL — ABNORMAL HIGH (ref 70–99)

## 2020-03-22 NOTE — Progress Notes (Signed)
Daily Session Note  Patient Details  Name: Luis Wise MRN: [id number] Date of Birth: 12/05/1940 Referring Provider:   Flowsheet Row Cardiac Rehab from 03/19/2020 in [facility] Cardiac and Pulmonary Rehab  Referring Provider [name] MD      Encounter Date: 03/22/2020  Check In:  Session Check In - 03/22/20 0924      Check-In   Supervising physician immediately available to respond to emergencies See telemetry face sheet for immediately available ER MD    Location [facility]-Cardiac & Pulmonary Rehab    Staff Present Birdie Sons, MPA, RN;Joseph Darrin Nipper, [place], RCEP, CCRP, CCET    Virtual Visit No    Medication changes reported     No    Fall or balance concerns reported    No    Warm-up and Cool-down Performed on first and last piece of equipment    Resistance Training Performed Yes    VAD Patient? No    PAD/SET Patient? No      Pain Assessment   Currently in Pain? No/denies              Social History   Tobacco Use  Smoking Status Never Smoker  Smokeless Tobacco Never Used    Goals Met:  Independence with exercise equipment Exercise tolerated well No report of cardiac concerns or symptoms Strength training completed today  Goals Unmet:  Not Applicable  Comments: First full day of exercise!  Patient was oriented to gym and equipment including functions, settings, policies, and procedures.  Patient's individual exercise prescription and treatment plan were reviewed.  All starting workloads were established based on the results of the 6 minute walk test done at initial orientation visit.  The plan for exercise progression was also introduced and progression will be customized based on patient's performance and goals.     Dr. Emily Filbert is Medical Director for [place] and [facility].

## 2020-03-27 ENCOUNTER — Encounter: Payer: Self-pay | Admitting: Family Medicine

## 2020-03-27 ENCOUNTER — Other Ambulatory Visit: Payer: Self-pay

## 2020-03-27 DIAGNOSIS — Z951 Presence of aortocoronary bypass graft: Secondary | ICD-10-CM

## 2020-03-27 LAB — GLUCOSE, CAPILLARY
Glucose-Capillary: 223 mg/dL — ABNORMAL HIGH (ref 70–99)
Glucose-Capillary: 151 mg/dL — ABNORMAL HIGH (ref 70–99)

## 2020-03-27 NOTE — Progress Notes (Signed)
Daily Session Note  Patient Details  Name: Luis Wise MRN: [id number] Date of Birth: 09/12/1940 Referring Provider:   Flowsheet Row Cardiac Rehab from 03/19/2020 in [facility] Cardiac and Pulmonary Rehab  Referring Provider [name] MD      Encounter Date: 03/27/2020  Check In:  Session Check In - 03/27/20 0929      Check-In   Supervising physician immediately available to respond to emergencies See telemetry face sheet for immediately available ER MD    Location [facility]-Cardiac & Pulmonary Rehab    Staff Present Birdie Sons, MPA, RN;Jessica Luan Pulling, MA, RCEP, CCRP, CCET;Joseph Hood RCP,RRT,BSRT    Virtual Visit No    Medication changes reported     No    Fall or balance concerns reported    No    Warm-up and Cool-down Performed on first and last piece of equipment    Resistance Training Performed Yes    VAD Patient? No    PAD/SET Patient? No      Pain Assessment   Currently in Pain? No/denies              Social History   Tobacco Use  Smoking Status Never Smoker  Smokeless Tobacco Never Used    Goals Met:  Independence with exercise equipment Exercise tolerated well No report of cardiac concerns or symptoms Strength training completed today  Goals Unmet:  Not Applicable  Comments: Pt able to follow exercise prescription today without complaint.  Will continue to monitor for progression.    Dr. Emily Filbert is Medical Director for [place] and [facility].

## 2020-03-28 ENCOUNTER — Other Ambulatory Visit: Payer: Self-pay | Admitting: Family Medicine

## 2020-03-29 ENCOUNTER — Other Ambulatory Visit: Payer: Self-pay

## 2020-03-29 DIAGNOSIS — Z951 Presence of aortocoronary bypass graft: Secondary | ICD-10-CM

## 2020-03-29 NOTE — Progress Notes (Signed)
Daily Session Note  Patient Details  Name: Luis Wise MRN: [id number] Date of Birth: 09-23-1940 Referring Provider:   Flowsheet Row Cardiac Rehab from 03/19/2020 in [facility] Cardiac and Pulmonary Rehab  Referring Provider [name] MD      Encounter Date: 03/29/2020  Check In:  Session Check In - 03/29/20 0915      Check-In   Supervising physician immediately available to respond to emergencies See telemetry face sheet for immediately available ER MD    Location [facility]-Cardiac & Pulmonary Rehab    Staff Present Birdie Sons, MPA, RN;Meredith Sherryll Burger, RN Margurite Auerbach, MS Exercise Physiologist    Virtual Visit No    Medication changes reported     No    Fall or balance concerns reported    No    Warm-up and Cool-down Performed on first and last piece of equipment    Resistance Training Performed Yes    VAD Patient? No    PAD/SET Patient? No      Pain Assessment   Currently in Pain? No/denies              Social History   Tobacco Use  Smoking Status Never Smoker  Smokeless Tobacco Never Used    Goals Met:  Independence with exercise equipment Exercise tolerated well No report of cardiac concerns or symptoms Strength training completed today  Goals Unmet:  Not Applicable  Comments: Pt able to follow exercise prescription today without complaint.  Will continue to monitor for progression.    Dr. Emily Filbert is Medical Director for [place] and [facility].

## 2020-04-01 ENCOUNTER — Other Ambulatory Visit: Payer: Self-pay

## 2020-04-01 ENCOUNTER — Encounter: Payer: Medicare Other | Admitting: *Deleted

## 2020-04-01 DIAGNOSIS — Z951 Presence of aortocoronary bypass graft: Secondary | ICD-10-CM

## 2020-04-01 NOTE — Progress Notes (Signed)
Daily Session Note  Patient Details  Name: Luis Wise MRN: [id number] Date of Birth: 10/27/1940 Referring Provider:   Flowsheet Row Cardiac Rehab from 03/19/2020 in [facility] Cardiac and Pulmonary Rehab  Referring Provider [name] MD      Encounter Date: 04/01/2020  Check In:  Session Check In - 04/01/20 1008      Check-In   Supervising physician immediately available to respond to emergencies See telemetry face sheet for immediately available ER MD    Location [facility]-Cardiac & Pulmonary Rehab    Staff Present Heath Lark, RN, BSN, Laveda Norman, BS, ACSM CEP, Exercise Physiologist;Joseph Tessie Fass RCP,RRT,BSRT    Virtual Visit No    Medication changes reported     No    Fall or balance concerns reported    No    Warm-up and Cool-down Performed on first and last piece of equipment    Resistance Training Performed Yes    VAD Patient? No    PAD/SET Patient? No      Pain Assessment   Currently in Pain? No/denies              Social History   Tobacco Use  Smoking Status Never Smoker  Smokeless Tobacco Never Used    Goals Met:  Independence with exercise equipment Exercise tolerated well No report of cardiac concerns or symptoms  Goals Unmet:  Not Applicable  Comments: Pt able to follow exercise prescription today without complaint.  Will continue to monitor for progression.    Dr. Emily Filbert is Medical Director for [place] and [facility].

## 2020-04-03 ENCOUNTER — Encounter: Payer: Self-pay | Admitting: *Deleted

## 2020-04-03 ENCOUNTER — Other Ambulatory Visit: Payer: Self-pay

## 2020-04-03 DIAGNOSIS — Z951 Presence of aortocoronary bypass graft: Secondary | ICD-10-CM | POA: Diagnosis not present

## 2020-04-03 NOTE — Progress Notes (Signed)
Cardiac Individual Treatment Plan  Patient Details  Name: Luis Wise MRN: [id number] Date of Birth: June 19, 1940 Referring Provider:   Flowsheet Row Cardiac Rehab from 03/19/2020 in [facility] Cardiac and Pulmonary Rehab  Referring Provider [name] MD      Initial Encounter Date:  Flowsheet Row Cardiac Rehab from 03/19/2020 in [facility] Cardiac and Pulmonary Rehab  Date 03/19/20      Visit Diagnosis: S/P CABG x 5  Patient's Home Medications on Admission:  Current Outpatient Medications:  .  amiodarone (PACERONE) 200 MG tablet, Take 1 tablet (200 mg total) by mouth daily., Disp: 90 tablet, Rfl: 3 .  amLODipine (NORVASC) 10 MG tablet, Take 1 tablet (10 mg total) by mouth daily., Disp: 90 tablet, Rfl: 1 .  aspirin EC 81 MG tablet, Take 1 tablet (81 mg total) by mouth daily. Swallow whole., Disp: 150 tablet, Rfl: 2 .  atorvastatin (LIPITOR) 80 MG tablet, Take 1 tablet (80 mg total) by mouth daily., Disp: 90 tablet, Rfl: 3 .  carvedilol (COREG) 12.5 MG tablet, Take 1 tablet (12.5 mg total) by mouth 2 (two) times daily., Disp: 180 tablet, Rfl: 3 .  cholecalciferol (VITAMIN D3) 25 MCG (1000 UNIT) tablet, Take 1,000 Units by mouth daily., Disp: , Rfl:  .  Coenzyme Q10 (COQ10) 100 MG CAPS, Take 100 mg by mouth daily. , Disp: , Rfl:  .  dapagliflozin propanediol (FARXIGA) 5 MG TABS tablet, Take 1 tablet (5 mg total) by mouth daily before breakfast., Disp: 30 tablet, Rfl: 11 .  fenofibrate 160 MG tablet, Take 1 tablet (160 mg total) by mouth daily., Disp: 90 tablet, Rfl: 1 .  ferrous sulfate 324 (65 Fe) MG TBEC, Take 1 tablet (325 mg total) by mouth every Monday, Wednesday, and Friday., Disp: , Rfl:  .  glimepiride (AMARYL) 4 MG tablet, TAKE 1 TABLET BY MOUTH EVERY DAY WITH BREAKFAST, Disp: 90 tablet, Rfl: 1 .  glucose blood (ONETOUCH ULTRA) test strip, TEST 1-2x a day, Disp: 200 strip, Rfl: 3 .  lisinopril (ZESTRIL) 20 MG tablet, TAKE 1 TABLET BY MOUTH EVERY DAY, Disp: 90 tablet, Rfl: 1 .   lisinopril-hydrochlorothiazide (ZESTORETIC) 20-25 MG tablet, Take 1 tablet by mouth daily., Disp: 90 tablet, Rfl: 1 .  metFORMIN (GLUCOPHAGE) 1000 MG tablet, $RemoveBe'500mg'jiKgUvPkc$  in the AM and $Remo'1000mg'LSfgD$  in the PM, Disp: , Rfl:  .  Misc Natural Products (URINOZINC PO), Take 1 tablet by mouth in the morning and at bedtime. , Disp: , Rfl:  .  Multiple Vitamins-Minerals (PRESERVISION AREDS 2 PO), Take 1 tablet by mouth 2 (two) times daily. , Disp: , Rfl:  .  traZODone (DESYREL) 50 MG tablet, TAKE 1 TO 1.5 TABLETS BY MOUTH EVERY NIGHT FOR SLEEP, Disp: 135 tablet, Rfl: 3  Past Medical History: Past Medical History:  Diagnosis Date  . Allergy    Amoxicillin  . Cataract 2017   Mild progression  . Coronary artery disease    a. s/p 5-vessel CABG (LIMA-LAD, SVG-diag, sequential SVG-OM1-OM 2, & SVG-PDA)  . Diabetes mellitus with complication ([place])   . Hyperlipidemia   . Hypertension   . Insomnia   . neuropathy    bilateral feet  . Postoperative atrial fibrillation (HCC)     Tobacco Use: Social History   Tobacco Use  Smoking Status Never Smoker  Smokeless Tobacco Never Used    Labs: Recent Review Flowsheet Data    Labs for ITP Cardiac and Pulmonary Rehab Latest Ref Rng & Units 01/29/2020 01/29/2020 01/29/2020 01/31/2020 03/11/2020  Cholestrol 0 - 200 mg/dL - - - - -   LDLCALC 0 - 99 mg/dL - - - - -   LDLDIRECT mg/dL - - - - -   HDL >39.00 mg/dL - - - - -   Trlycerides 0.0 - 149.0 mg/dL - - - - -   Hemoglobin A1c 4.0 - 5.6 % - - - - 6.9(A)   PHART 7.350 - 7.450 7.369 7.541(H) 7.386 - -   PCO2ART 32.0 - 48.0 mmHg 43.8 22.8(L) 38.0 - -   HCO3 20.0 - 28.0 mmol/L 25.5 19.6(L) 22.7 - -   TCO2 22 - 32 mmol/L 27 20(L) 24 - -   ACIDBASEDEF 0.0 - 2.0 mmol/L - 2.0 2.0 - -   O2SAT % 99.0 98.0 90.0 73.7 -       Exercise Target Goals: Exercise Program Goal: Individual exercise prescription set using results from initial 6 min walk test and THRR while considering  patient's activity barriers and safety.    Exercise Prescription Goal: Initial exercise prescription builds to 30-45 minutes a day of aerobic activity, 2-3 days per week.  Home exercise guidelines will be given to patient during program as part of exercise prescription that the participant will acknowledge.   Education: Aerobic Exercise: - Group verbal and visual presentation on the components of exercise prescription. Introduces F.I.T.T principle from ACSM for exercise prescriptions.  Reviews F.I.T.T. principles of aerobic exercise including progression. Written material given at graduation.   Education: Resistance Exercise: - Group verbal and visual presentation on the components of exercise prescription. Introduces F.I.T.T principle from ACSM for exercise prescriptions  Reviews F.I.T.T. principles of resistance exercise including progression. Written material given at graduation.    Education: Exercise & Equipment Safety: - Individual verbal instruction and demonstration of equipment use and safety with use of the equipment. Flowsheet Row Cardiac Rehab from 03/27/2020 in [facility] Cardiac and Pulmonary Rehab  Date 03/18/20  Educator [facility]  Instruction Review Code 1- Verbalizes Understanding      Education: Exercise Physiology & General Exercise Guidelines: - Group verbal and written instruction with models to review the exercise physiology of the cardiovascular system and associated critical values. Provides general exercise guidelines with specific guidelines to those with heart or lung disease.    Education: Flexibility, Balance, Mind/Body Relaxation: - Group verbal and visual presentation with interactive activity on the components of exercise prescription. Introduces F.I.T.T principle from ACSM for exercise prescriptions. Reviews F.I.T.T. principles of flexibility and balance exercise training including progression. Also discusses the mind body connection.  Reviews various relaxation techniques to help reduce and manage stress  (i.e. Deep breathing, progressive muscle relaxation, and visualization). Balance handout provided to take home. Written material given at graduation.   Activity Barriers & Risk Stratification:  Activity Barriers & Cardiac Risk Stratification - 03/19/20 1018      Activity Barriers & Cardiac Risk Stratification   Activity Barriers None    Cardiac Risk Stratification High           6 Minute Walk:  6 Minute Walk    Row Name 03/19/20 1018         6 Minute Walk   Phase Initial     Distance 1540 feet     Walk Time 6 minutes     # of Rest Breaks 0     MPH 2.92     METS 3.17     RPE 11     VO2 Peak 11.1     Symptoms No  Resting HR 75 bpm     Resting BP 124/74     Resting Oxygen Saturation  99 %     Exercise Oxygen Saturation  during 6 min walk 99 %     Max Ex. HR 86 bpm     Max Ex. BP 136/60     2 Minute Post BP 126/58            Oxygen Initial Assessment:   Oxygen Re-Evaluation:   Oxygen Discharge (Final Oxygen Re-Evaluation):   Initial Exercise Prescription:  Initial Exercise Prescription - 03/19/20 1000      Date of Initial Exercise RX and Referring Provider   Date 03/19/20    Referring Provider [name] MD      Treadmill   MPH 2.7    Grade 1    Minutes 15    METs 3.44      Recumbant Bike   Level 2    RPM 50    Watts 27    Minutes 15    METs 3.1      NuStep   Level 3    SPM 80    Minutes 15    METs 3.1      Recumbant Elliptical   Level 2    RPM 50    Minutes 15    METs 3      Elliptical   Level 1    Speed 2.9    Minutes 15      Prescription Details   Frequency (times per week) 3    Duration Progress to 30 minutes of continuous aerobic without signs/symptoms of physical distress      Intensity   THRR 40-80% of Max Heartrate 102-129    Ratings of Perceived Exertion 11-13    Perceived Dyspnea 0-4      Progression   Progression Continue to progress workloads to maintain intensity without signs/symptoms of physical  distress.      Resistance Training   Weight 4 lb    Reps 10-15           Perform Capillary Blood Glucose checks as needed.  Exercise Prescription Changes:  Exercise Prescription Changes    Row Name 03/19/20 1000 03/27/20 0800           Response to Exercise   Blood Pressure (Admit) 124/74 138/66      Blood Pressure (Exercise) 136/60 138/56      Blood Pressure (Exit) 126/58 126/64      Heart Rate (Admit) 75 bpm 75 bpm      Heart Rate (Exercise) 86 bpm 95 bpm      Heart Rate (Exit) 75 bpm 82 bpm      Oxygen Saturation (Admit) 99 % -      Oxygen Saturation (Exercise) 99 % -      Rating of Perceived Exertion (Exercise) 11 11      Symptoms none none      Comments walk test results first day      Duration - Progress to 30 minutes of  aerobic without signs/symptoms of physical distress      Intensity - THRR unchanged             Progression   Progression - Continue to progress workloads to maintain intensity without signs/symptoms of physical distress.      Average METs - 2.9             Resistance Training   Weight - 4 lb  Reps - 10-15             Interval Training   Interval Training - No             Treadmill   MPH - 3      Grade - 1      Minutes - 15      METs - 3.71             Recumbant Elliptical   Level - 2      RPM - 50      Minutes - 15      METs - 2.1             Exercise Comments:  Exercise Comments    Row Name 03/22/20 0926           Exercise Comments First full day of exercise!  Patient was oriented to gym and equipment including functions, settings, policies, and procedures.  Patient's individual exercise prescription and treatment plan were reviewed.  All starting workloads were established based on the results of the 6 minute walk test done at initial orientation visit.  The plan for exercise progression was also introduced and progression will be customized based on patient's performance and goals.              Exercise  Goals and Review:  Exercise Goals    Row Name 03/19/20 1021             Exercise Goals   Increase Physical Activity Yes       Intervention Provide advice, education, support and counseling about physical activity/exercise needs.;Develop an individualized exercise prescription for aerobic and resistive training based on initial evaluation findings, risk stratification, comorbidities and participant's personal goals.       Expected Outcomes Short Term: Attend rehab on a regular basis to increase amount of physical activity.;Long Term: Add in home exercise to make exercise part of routine and to increase amount of physical activity.;Long Term: Exercising regularly at least 3-5 days a week.       Increase Strength and Stamina Yes       Intervention Provide advice, education, support and counseling about physical activity/exercise needs.;Develop an individualized exercise prescription for aerobic and resistive training based on initial evaluation findings, risk stratification, comorbidities and participant's personal goals.       Expected Outcomes Short Term: Perform resistance training exercises routinely during rehab and add in resistance training at home;Short Term: Increase workloads from initial exercise prescription for resistance, speed, and METs.;Long Term: Improve cardiorespiratory fitness, muscular endurance and strength as measured by increased METs and functional capacity (6MWT)       Able to understand and use rate of perceived exertion (RPE) scale Yes       Intervention Provide education and explanation on how to use RPE scale       Expected Outcomes Short Term: Able to use RPE daily in rehab to express subjective intensity level;Long Term:  Able to use RPE to guide intensity level when exercising independently       Able to understand and use Dyspnea scale Yes       Intervention Provide education and explanation on how to use Dyspnea scale       Expected Outcomes Short Term: Able to use  Dyspnea scale daily in rehab to express subjective sense of shortness of breath during exertion;Long Term: Able to use Dyspnea scale to guide intensity level when exercising independently  Knowledge and understanding of Target Heart Rate Range (THRR) Yes       Intervention Provide education and explanation of THRR including how the numbers were predicted and where they are located for reference       Expected Outcomes Short Term: Able to state/look up THRR;Short Term: Able to use daily as guideline for intensity in rehab;Long Term: Able to use THRR to govern intensity when exercising independently       Able to check pulse independently Yes       Intervention Provide education and demonstration on how to check pulse in carotid and radial arteries.;Review the importance of being able to check your own pulse for safety during independent exercise       Expected Outcomes Short Term: Able to explain why pulse checking is important during independent exercise;Long Term: Able to check pulse independently and accurately       Understanding of Exercise Prescription Yes       Intervention Provide education, explanation, and written materials on patient's individual exercise prescription       Expected Outcomes Short Term: Able to explain program exercise prescription;Long Term: Able to explain home exercise prescription to exercise independently              Exercise Goals Re-Evaluation :  Exercise Goals Re-Evaluation    Row Name 03/22/20 0926             Exercise Goal Re-Evaluation   Exercise Goals Review Increase Physical Activity;Able to understand and use rate of perceived exertion (RPE) scale;Knowledge and understanding of Target Heart Rate Range (THRR);Understanding of Exercise Prescription;Increase Strength and Stamina;Able to understand and use Dyspnea scale;Able to check pulse independently       Comments Reviewed RPE and dyspnea scales, THR and program prescription with pt today.  Pt  voiced understanding and was given a copy of goals to take home.       Expected Outcomes Short: Use RPE daily to regulate intensity. Long: Follow program prescription in THR.              Discharge Exercise Prescription (Final Exercise Prescription Changes):  Exercise Prescription Changes - 03/27/20 0800      Response to Exercise   Blood Pressure (Admit) 138/66    Blood Pressure (Exercise) 138/56    Blood Pressure (Exit) 126/64    Heart Rate (Admit) 75 bpm    Heart Rate (Exercise) 95 bpm    Heart Rate (Exit) 82 bpm    Rating of Perceived Exertion (Exercise) 11    Symptoms none    Comments first day    Duration Progress to 30 minutes of  aerobic without signs/symptoms of physical distress    Intensity THRR unchanged      Progression   Progression Continue to progress workloads to maintain intensity without signs/symptoms of physical distress.    Average METs 2.9      Resistance Training   Weight 4 lb    Reps 10-15      Interval Training   Interval Training No      Treadmill   MPH 3    Grade 1    Minutes 15    METs 3.71      Recumbant Elliptical   Level 2    RPM 50    Minutes 15    METs 2.1           Nutrition:  Target Goals: Understanding of nutrition guidelines, daily intake of sodium '1500mg'$ , cholesterol '200mg'$ , calories 30%  from fat and 7% or less from saturated fats, daily to have 5 or more servings of fruits and vegetables.  Education: All About Nutrition: -Group instruction provided by verbal, written material, interactive activities, discussions, models, and posters to present general guidelines for heart healthy nutrition including fat, fiber, MyPlate, the role of sodium in heart healthy nutrition, utilization of the nutrition label, and utilization of this knowledge for meal planning. Follow up email sent as well. Written material given at graduation. Flowsheet Row Cardiac Rehab from 03/27/2020 in [facility] Cardiac and Pulmonary Rehab  Education need  identified 03/19/20  Date 03/21/20  Educator Gayle Mill  Instruction Review Code 1- Verbalizes Understanding      Biometrics:  Pre Biometrics - 03/19/20 1022      Pre Biometrics   Height 5' 10.4" (1.788 m)    Weight 162 lb 6.4 oz (73.7 kg)    BMI (Calculated) 23.04    Single Leg Stand 30 seconds            Nutrition Therapy Plan and Nutrition Goals:  Nutrition Therapy & Goals - 03/19/20 0947      Nutrition Therapy   Diet heart healthy, low Na    Protein (specify units) 60g    Fiber 30 grams    Whole Grain Foods 3 servings    Saturated Fats 12 max. 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 Down about 10 lbs, weight stable now. B: 1 egg with toast (whole wheat) or cereal (oatmeal - rolled oats) with brown sugar and walnuts. Decaf cooffee with almond milk sweetener. L: Warden/ranger - grilled flounder with salad and vegetable or leftovers or lean cuisine "jazzed up" S: apple or peanuts or crackers with peanut butter D: light supper: favorite is shrimp salad, chicken, fish, brown rice, vegetables (broccoli, green beans, beans), bean soup. uses olive oil and canola oil. "50% salt" seasoning and mrs dash (low salt). Drinks:unsweet tea with slenda, fresca. Met with RD at [facility]. They are including more beans and are reading Dr. Maurine Minister book and cookbook, discussed his philosophy. Discussed how eating mostly whole plant foods can help with heart health and diabetes. Discussed heart healthy eaitng and diabetes friendly eating.      Intervention Plan   Intervention Prescribe, educate and counsel regarding individualized specific dietary modifications aiming towards targeted core components such as weight, hypertension, lipid management, diabetes, heart failure and other  comorbidities.;Nutrition handout(s) given to patient.    Expected Outcomes Short Term Goal: Understand basic principles of dietary content, such as calories, fat, sodium, cholesterol and nutrients.;Short Term Goal: A plan has been developed with personal nutrition goals set during dietitian appointment.;Long Term Goal: Adherence to prescribed nutrition plan.           Nutrition Assessments:  MEDIFICTS Score Key:  ?70 Need to make dietary changes   40-70 Heart Healthy Diet  ? 40 Therapeutic Level Cholesterol Diet  Flowsheet Row Cardiac Rehab from 03/19/2020 in [facility] Cardiac and Pulmonary Rehab  Picture Your Plate Total Score on Admission 81     Picture Your Plate Scores:  <35 Unhealthy dietary pattern with much room for improvement.  41-50 Dietary pattern unlikely to meet recommendations for good health and room for improvement.  51-60 More healthful dietary pattern,  with some room for improvement.   >60 Healthy dietary pattern, although there may be some specific behaviors that could be improved.    Nutrition Goals Re-Evaluation:   Nutrition Goals Discharge (Final Nutrition Goals Re-Evaluation):   Psychosocial: Target Goals: Acknowledge presence or absence of significant depression and/or stress, maximize coping skills, provide positive support system. Participant is able to verbalize types and ability to use techniques and skills needed for reducing stress and depression.   Education: Stress, Anxiety, and Depression - Group verbal and visual presentation to define topics covered.  Reviews how body is impacted by stress, anxiety, and depression.  Also discusses healthy ways to reduce stress and to treat/manage anxiety and depression.  Written material given at graduation.   Education: Sleep Hygiene -Provides group verbal and written instruction about how sleep can affect your health.  Define sleep hygiene, discuss sleep cycles and impact of sleep habits. Review good sleep  hygiene tips.    Initial Review & Psychosocial Screening:  Initial Psych Review & Screening - 03/18/20 1329      Initial Review   Current issues with None Identified      Family Dynamics   Good Support System? Yes    Comments He can look to his wife and his son that lives close by. [place] has a positive outlook on his health since he has got a good report form the doctor.      Barriers   Psychosocial barriers to participate in program The patient should benefit from training in stress management and relaxation.;There are no identifiable barriers or psychosocial needs.      Screening Interventions   Interventions Encouraged to exercise;To provide support and resources with identified psychosocial needs;Provide feedback about the scores to participant    Expected Outcomes Short Term goal: Utilizing psychosocial counselor, staff and physician to assist with identification of specific Stressors or current issues interfering with healing process. Setting desired goal for each stressor or current issue identified.;Long Term Goal: Stressors or current issues are controlled or eliminated.;Short Term goal: Identification and review with participant of any Quality of Life or Depression concerns found by scoring the questionnaire.;Long Term goal: The participant improves quality of Life and PHQ9 Scores as seen by post scores and/or verbalization of changes           Quality of Life Scores:   Quality of Life - 03/19/20 1022      Quality of Life   Select Quality of Life      Quality of Life Scores   Health/Function Pre 23.17 %    Socioeconomic Pre 24 %    Psych/Spiritual Pre 24 %    Family Pre 28.8 %    GLOBAL Pre 24.33 %          Scores of 19 and below usually indicate a poorer quality of life in these areas.  A difference of  2-3 points is a clinically meaningful difference.  A difference of 2-3 points in the total score of the Quality of Life Index has been associated with significant  improvement in overall quality of life, self-image, physical symptoms, and general health in studies assessing change in quality of life.  PHQ-9: Recent Review Flowsheet Data    Depression screen [facility] 2/9 03/19/2020 03/11/2020 01/11/2020 06/13/2018 06/09/2017   Decreased Interest 0 0 0 0 0   Down, Depressed, Hopeless 0 0 0 0 0   PHQ - 2 Score 0 0 0 0 0   Altered sleeping 0 - - - -  Tired, decreased energy 0 - - - -   Change in appetite 0 - - - -   Feeling bad or failure about yourself  0 - - - -   Trouble concentrating 0 - - - -   Moving slowly or fidgety/restless 0 - - - -   Suicidal thoughts 0 - - - -   PHQ-9 Score 0 - - - -   Difficult doing work/chores Not difficult at all - - - -     Interpretation of Total Score  Total Score Depression Severity:  1-4 = Minimal depression, 5-9 = Mild depression, 10-14 = Moderate depression, 15-19 = Moderately severe depression, 20-27 = Severe depression   Psychosocial Evaluation and Intervention:  Psychosocial Evaluation - 03/18/20 1330      Psychosocial Evaluation & Interventions   Interventions Encouraged to exercise with the program and follow exercise prescription    Comments He can look to his wife and his son that lives close by. [place] has a positive outlook on his health since he has got a good report form the doctor.    Expected Outcomes Short: Exercise regularly to support mental health and notify staff of any changes. Long: maintain mental health and well being through teaching of rehab or prescribed medications independently.    Continue Psychosocial Services  Follow up required by staff           Psychosocial Re-Evaluation:   Psychosocial Discharge (Final Psychosocial Re-Evaluation):   Vocational Rehabilitation: Provide vocational rehab assistance to qualifying candidates.   Vocational Rehab Evaluation & Intervention:   Education: Education Goals: Education classes will be provided on a variety of topics geared toward better  understanding of heart health and risk factor modification. Participant will state understanding/return demonstration of topics presented as noted by education test scores.  Learning Barriers/Preferences:  Learning Barriers/Preferences - 03/18/20 1328      Learning Barriers/Preferences   Learning Barriers None    Learning Preferences None           General Cardiac Education Topics:  AED/CPR: - Group verbal and written instruction with the use of models to demonstrate the basic use of the AED with the basic ABC's of resuscitation.   Anatomy and Cardiac Procedures: - Group verbal and visual presentation and models provide information about basic cardiac anatomy and function. Reviews the testing methods done to diagnose heart disease and the outcomes of the test results. Describes the treatment choices: Medical Management, Angioplasty, or Coronary Bypass Surgery for treating various heart conditions including Myocardial Infarction, Angina, Valve Disease, and Cardiac Arrhythmias.  Written material given at graduation. Flowsheet Row Cardiac Rehab from 03/27/2020 in [facility] Cardiac and Pulmonary Rehab  Education need identified 03/19/20      Medication Safety: - Group verbal and visual instruction to review commonly prescribed medications for heart and lung disease. Reviews the medication, class of the drug, and side effects. Includes the steps to properly store meds and maintain the prescription regimen.  Written material given at graduation. Flowsheet Row Cardiac Rehab from 03/27/2020 in [facility] Cardiac and Pulmonary Rehab  Date 03/27/20  Educator SB  Instruction Review Code 1- Verbalizes Understanding      Intimacy: - Group verbal instruction through game format to discuss how heart and lung disease can affect sexual intimacy. Written material given at graduation..   Know Your Numbers and Heart Failure: - Group verbal and visual instruction to discuss disease risk factors for cardiac and  pulmonary disease and treatment options.  Reviews associated  critical values for Overweight/Obesity, Hypertension, Cholesterol, and Diabetes.  Discusses basics of heart failure: signs/symptoms and treatments.  Introduces Heart Failure Zone chart for action plan for heart failure.  Written material given at graduation.   Infection Prevention: - Provides verbal and written material to individual with discussion of infection control including proper hand washing and proper equipment cleaning during exercise session. Flowsheet Row Cardiac Rehab from 03/27/2020 in [facility] Cardiac and Pulmonary Rehab  Date 03/18/20  Educator [facility]  Instruction Review Code 1- Verbalizes Understanding      Falls Prevention: - Provides verbal and written material to individual with discussion of falls prevention and safety. Flowsheet Row Cardiac Rehab from 03/27/2020 in [facility] Cardiac and Pulmonary Rehab  Date 03/18/20  Educator [facility]  Instruction Review Code 1- Verbalizes Understanding      Other: -Provides group and verbal instruction on various topics (see comments)   Knowledge Questionnaire Score:  Knowledge Questionnaire Score - 03/19/20 1022      Knowledge Questionnaire Score   Pre Score 24/26 Education Focus: MI, nutrition           Core Components/Risk Factors/Patient Goals at Admission:  Personal Goals and Risk Factors at Admission - 03/19/20 1029      Core Components/Risk Factors/Patient Goals on Admission    Weight Management Yes;Weight Maintenance    Intervention Weight Management: Develop a combined nutrition and exercise program designed to reach desired caloric intake, while maintaining appropriate intake of nutrient and fiber, sodium and fats, and appropriate energy expenditure required for the weight goal.;Weight Management: Provide education and appropriate resources to help participant work on and attain dietary goals.;Weight Management/Obesity: Establish reasonable short term and long term  weight goals.    Admit Weight 162 lb 6.4 oz (73.7 kg)    Goal Weight: Short Term 164 lb (74.4 kg)    Goal Weight: Long Term 164 lb (74.4 kg)    Expected Outcomes Short Term: Continue to assess and modify interventions until short term weight is achieved;Long Term: Adherence to nutrition and physical activity/exercise program aimed toward attainment of established weight goal;Weight Maintenance: Understanding of the daily nutrition guidelines, which includes 25-35% calories from fat, 7% or less cal from saturated fats, less than $RemoveB'200mg'nnuXhdWH$  cholesterol, less than 1.5gm of sodium, & 5 or more servings of fruits and vegetables daily    Diabetes Yes    Intervention Provide education about signs/symptoms and action to take for hypo/hyperglycemia.;Provide education about proper nutrition, including hydration, and aerobic/resistive exercise prescription along with prescribed medications to achieve blood glucose in normal ranges: Fasting glucose 65-99 mg/dL    Expected Outcomes Short Term: Participant verbalizes understanding of the signs/symptoms and immediate care of hyper/hypoglycemia, proper foot care and importance of medication, aerobic/resistive exercise and nutrition plan for blood glucose control.;Long Term: Attainment of HbA1C < 7%.    Hypertension Yes    Intervention Provide education on lifestyle modifcations including regular physical activity/exercise, weight management, moderate sodium restriction and increased consumption of fresh fruit, vegetables, and low fat dairy, alcohol moderation, and smoking cessation.;Monitor prescription use compliance.    Expected Outcomes Short Term: Continued assessment and intervention until BP is < 140/35mm HG in hypertensive participants. < 130/72mm HG in hypertensive participants with diabetes, heart failure or chronic kidney disease.;Long Term: Maintenance of blood pressure at goal levels.    Lipids Yes    Intervention Provide education and support for participant on  nutrition & aerobic/resistive exercise along with prescribed medications to achieve LDL '70mg'$ , HDL >$Remo'40mg'yoRvO$ .    Expected Outcomes  Short Term: Participant states understanding of desired cholesterol values and is compliant with medications prescribed. Participant is following exercise prescription and nutrition guidelines.;Long Term: Cholesterol controlled with medications as prescribed, with individualized exercise RX and with personalized nutrition plan. Value goals: LDL < $Rem'70mg'gQxZ$ , HDL > 40 mg.           Education:Diabetes - Individual verbal and written instruction to review signs/symptoms of diabetes, desired ranges of glucose level fasting, after meals and with exercise. Acknowledge that pre and post exercise glucose checks will be done for 3 sessions at entry of program. [place] from 03/27/2020 in [facility] Cardiac and Pulmonary Rehab  Date 03/18/20  Educator [facility]  Instruction Review Code 1- Verbalizes Understanding      Core Components/Risk Factors/Patient Goals Review:    Core Components/Risk Factors/Patient Goals at Discharge (Final Review):    ITP Comments:  ITP Comments    Row Name 03/18/20 1341 03/19/20 1017 03/22/20 0926 04/03/20 0906     ITP Comments Virtual Visit completed. Patient informed on EP and RD appointment and 6 Minute walk test. Patient also informed of patient health questionnaires on My Chart. Patient Verbalizes understanding. Visit diagnosis can be found in [facility] 01/26/2020. Completed 6MWT and gym orientation. Initial ITP created and sent for review to Dr. Emily Filbert, Medical Director. First full day of exercise!  Patient was oriented to gym and equipment including functions, settings, policies, and procedures.  Patient's individual exercise prescription and treatment plan were reviewed.  All starting workloads were established based on the results of the 6 minute walk test done at initial orientation visit.  The plan for exercise progression was also  introduced and progression will be customized based on patient's performance and goals. 30 Day review completed. Medical Director ITP review done, changes made as directed, and signed approval by Medical Director.  new to program           Comments:

## 2020-04-03 NOTE — Progress Notes (Signed)
Daily Session Note  Patient Details  Name: Luis Wise MRN: [id number] Date of Birth: November 27, 1940 Referring Provider:   Flowsheet Row Cardiac Rehab from 03/19/2020 in [facility] Cardiac and Pulmonary Rehab  Referring Provider [name] MD      Encounter Date: 04/03/2020  Check In:  Session Check In - 04/03/20 0917      Check-In   Supervising physician immediately available to respond to emergencies See telemetry face sheet for immediately available ER MD    Location [facility]-Cardiac & Pulmonary Rehab    Staff Present Birdie Sons, MPA, RN;Amanda Oletta Darter, BA, ACSM CEP, Exercise Physiologist;Jessica Luan Pulling, MA, RCEP, CCRP, CCET    Virtual Visit No    Medication changes reported     No    Fall or balance concerns reported    No    Warm-up and Cool-down Performed on first and last piece of equipment    Resistance Training Performed Yes    VAD Patient? No    PAD/SET Patient? No      Pain Assessment   Currently in Pain? No/denies              Social History   Tobacco Use  Smoking Status Never Smoker  Smokeless Tobacco Never Used    Goals Met:  Independence with exercise equipment Exercise tolerated well No report of cardiac concerns or symptoms Strength training completed today  Goals Unmet:  Not Applicable  Comments: Pt able to follow exercise prescription today without complaint.  Will continue to monitor for progression.    Dr. Emily Filbert is Medical Director for [place] and [facility].

## 2020-04-05 ENCOUNTER — Other Ambulatory Visit: Payer: Self-pay | Admitting: Family Medicine

## 2020-04-05 ENCOUNTER — Other Ambulatory Visit: Payer: Self-pay

## 2020-04-05 DIAGNOSIS — Z951 Presence of aortocoronary bypass graft: Secondary | ICD-10-CM | POA: Diagnosis not present

## 2020-04-05 NOTE — Progress Notes (Signed)
Daily Session Note  Patient Details  Name: Luis Wise MRN: [id number] Date of Birth: Apr 15, 1940 Referring Provider:   Flowsheet Row Cardiac Rehab from 03/19/2020 in [facility] Cardiac and Pulmonary Rehab  Referring Provider [name] MD      Encounter Date: 04/05/2020  Check In:  Session Check In - 04/05/20 0925      Check-In   Supervising physician immediately available to respond to emergencies See telemetry face sheet for immediately available ER MD    Location [facility]-Cardiac & Pulmonary Rehab    Staff Present Birdie Sons, MPA, RN;Joseph Darrin Nipper, [place], RCEP, CCRP, CCET    Virtual Visit No    Medication changes reported     No    Fall or balance concerns reported    No    Warm-up and Cool-down Performed on first and last piece of equipment    Resistance Training Performed Yes    VAD Patient? No    PAD/SET Patient? No      Pain Assessment   Currently in Pain? No/denies              Social History   Tobacco Use  Smoking Status Never Smoker  Smokeless Tobacco Never Used    Goals Met:  Independence with exercise equipment Exercise tolerated well No report of cardiac concerns or symptoms Strength training completed today  Goals Unmet:  Not Applicable  Comments: Pt able to follow exercise prescription today without complaint.  Will continue to monitor for progression.    Dr. Emily Filbert is Medical Director for [place] and [facility].

## 2020-04-07 ENCOUNTER — Other Ambulatory Visit: Payer: Self-pay | Admitting: Cardiovascular Disease

## 2020-04-07 MED ORDER — SILDENAFIL CITRATE 20 MG PO TABS: 20.0000 mg | ORAL_TABLET | Freq: Three times a day (TID) | ORAL | 3 refills | Status: DC | PRN

## 2020-04-08 ENCOUNTER — Other Ambulatory Visit: Payer: Self-pay

## 2020-04-08 ENCOUNTER — Encounter: Payer: Medicare Other | Admitting: *Deleted

## 2020-04-08 DIAGNOSIS — Z951 Presence of aortocoronary bypass graft: Secondary | ICD-10-CM | POA: Diagnosis not present

## 2020-04-08 NOTE — Progress Notes (Signed)
Daily Session Note  Patient Details  Name: Luis Wise MRN: [id number] Date of Birth: 07/03/1940 Referring Provider:   Flowsheet Row Cardiac Rehab from 03/19/2020 in [facility] Cardiac and Pulmonary Rehab  Referring Provider [name] MD      Encounter Date: 04/08/2020  Check In:  Session Check In - 04/08/20 0937      Check-In   Supervising physician immediately available to respond to emergencies See telemetry face sheet for immediately available ER MD    Location [facility]-Cardiac & Pulmonary Rehab    Staff Present Heath Lark, RN, BSN, Laveda Norman, BS, ACSM CEP, Exercise Physiologist;Joseph Tessie Fass RCP,RRT,BSRT    Virtual Visit No    Medication changes reported     No    Fall or balance concerns reported    No    Warm-up and Cool-down Performed on first and last piece of equipment    Resistance Training Performed Yes    VAD Patient? No    PAD/SET Patient? No      Pain Assessment   Currently in Pain? No/denies              Social History   Tobacco Use  Smoking Status Never Smoker  Smokeless Tobacco Never Used    Goals Met:  Independence with exercise equipment Exercise tolerated well No report of cardiac concerns or symptoms  Goals Unmet:  Not Applicable  Comments: Pt able to follow exercise prescription today without complaint.  Will continue to monitor for progression.    Dr. Emily Filbert is Medical Director for [place] and [facility].

## 2020-04-09 ENCOUNTER — Telehealth: Payer: Self-pay

## 2020-04-09 NOTE — Telephone Encounter (Signed)
KEY: [id number] Prior Authorization completed for sildenafil 20mg  tablets in [email or web address]  Your information has been submitted to [employer]. [employer] will review the request and will issue a decision, typically within 1-3 days from your submission. You can check the updated outcome later by reopening this request.  If [employer] has not responded in 1-3 days or if you have any questions about your ePA request, please contact [employer] at [phone].

## 2020-04-10 ENCOUNTER — Other Ambulatory Visit: Payer: Self-pay

## 2020-04-10 ENCOUNTER — Encounter: Payer: Medicare Other | Attending: Cardiovascular Disease

## 2020-04-10 DIAGNOSIS — Z951 Presence of aortocoronary bypass graft: Secondary | ICD-10-CM | POA: Insufficient documentation

## 2020-04-10 NOTE — Progress Notes (Signed)
Daily Session Note  Patient Details  Name: Luis Wise MRN: [id number] Date of Birth: 1940-07-16 Referring Provider:   Flowsheet Row Cardiac Rehab from 03/19/2020 in [facility] Cardiac and Pulmonary Rehab  Referring Provider [name] MD      Encounter Date: 04/10/2020  Check In:  Session Check In - 04/10/20 8811      Check-In   Supervising physician immediately available to respond to emergencies See telemetry face sheet for immediately available ER MD    Location [facility]-Cardiac & Pulmonary Rehab    Staff Present Birdie Sons, MPA, RN;Amanda Oletta Darter, BA, ACSM CEP, Exercise Physiologist;Joseph Tessie Fass RCP,RRT,BSRT    Virtual Visit No    Medication changes reported     No    Fall or balance concerns reported    No    Warm-up and Cool-down Performed on first and last piece of equipment    Resistance Training Performed Yes    VAD Patient? No    PAD/SET Patient? No      Pain Assessment   Currently in Pain? No/denies              Social History   Tobacco Use  Smoking Status Never Smoker  Smokeless Tobacco Never Used    Goals Met:  Independence with exercise equipment Exercise tolerated well No report of cardiac concerns or symptoms Strength training completed today  Goals Unmet:  Not Applicable  Comments: Pt able to follow exercise prescription today without complaint.  Will continue to monitor for progression.    Dr. Emily Filbert is Medical Director for [place] and [facility].

## 2020-04-11 ENCOUNTER — Other Ambulatory Visit: Payer: Self-pay

## 2020-04-11 ENCOUNTER — Other Ambulatory Visit
Admission: RE | Admit: 2020-04-11 | Discharge: 2020-04-11 | Disposition: A | Discharge status: Home / Self Care | Payer: Medicare Other | Attending: Physician Assistant | Admitting: Physician Assistant

## 2020-04-11 DIAGNOSIS — I2510 Atherosclerotic heart disease of native coronary artery without angina pectoris: Secondary | ICD-10-CM | POA: Diagnosis not present

## 2020-04-11 DIAGNOSIS — D649 Anemia, unspecified: Secondary | ICD-10-CM

## 2020-04-11 DIAGNOSIS — Z951 Presence of aortocoronary bypass graft: Secondary | ICD-10-CM | POA: Diagnosis not present

## 2020-04-11 LAB — HEPATIC FUNCTION PANEL
ALT: 39 U/L (ref 0–44)
AST: 33 U/L (ref 15–41)
Albumin: 4.8 g/dL (ref 3.5–5.0)
Alkaline Phosphatase: 42 U/L (ref 38–126)
Bilirubin, Direct: <0.1 mg/dL (ref 0.0–0.2)
Total Bilirubin: 0.5 mg/dL (ref 0.3–1.2)
Total Protein: 7.4 g/dL (ref 6.5–8.1)

## 2020-04-11 LAB — LIPID PANEL
Cholesterol: 122 mg/dL (ref 0–200)
HDL: 31 mg/dL — ABNORMAL LOW (ref >40)
LDL Cholesterol: 66 mg/dL (ref 0–99)
Total CHOL/HDL Ratio: 3.9 RATIO
Triglycerides: 127 mg/dL (ref <150)
VLDL: 25 mg/dL (ref 0–40)

## 2020-04-11 MED ORDER — SILDENAFIL CITRATE 20 MG PO TABS: 20.0000 mg | ORAL_TABLET | Freq: Three times a day (TID) | ORAL | 3 refills | Status: DC | PRN

## 2020-04-11 NOTE — Progress Notes (Signed)
Daily Session Note  Patient Details  Name: Luis Wise MRN: [id number] Date of Birth: 09/04/1940 Referring Provider:   Flowsheet Row Cardiac Rehab from 03/19/2020 in [facility] Cardiac and Pulmonary Rehab  Referring Provider [name] MD      Encounter Date: 04/11/2020  Check In:  Session Check In - 04/11/20 0926      Check-In   Supervising physician immediately available to respond to emergencies See telemetry face sheet for immediately available ER MD    Location [facility]-Cardiac & Pulmonary Rehab    Staff Present Birdie Sons, MPA, RN;Melissa Caiola RDN, Rowe Pavy, BA, ACSM CEP, Exercise Physiologist    Virtual Visit No    Medication changes reported     No    Fall or balance concerns reported    No    Warm-up and Cool-down Performed on first and last piece of equipment    Resistance Training Performed Yes    VAD Patient? No    PAD/SET Patient? No      Pain Assessment   Currently in Pain? No/denies              Social History   Tobacco Use  Smoking Status Never Smoker  Smokeless Tobacco Never Used    Goals Met:  Independence with exercise equipment Exercise tolerated well No report of cardiac concerns or symptoms Strength training completed today  Goals Unmet:  Not Applicable  Comments: Pt able to follow exercise prescription today without complaint.  Will continue to monitor for progression.    Dr. Emily Filbert is Medical Director for [place] and [facility].

## 2020-04-15 ENCOUNTER — Other Ambulatory Visit: Payer: Self-pay

## 2020-04-15 ENCOUNTER — Encounter: Payer: Medicare Other | Admitting: *Deleted

## 2020-04-15 DIAGNOSIS — Z951 Presence of aortocoronary bypass graft: Secondary | ICD-10-CM

## 2020-04-15 NOTE — Progress Notes (Signed)
Daily Session Note  Patient Details  Name: Luis Wise MRN: [id number] Date of Birth: [birth year] Referring Provider:   Flowsheet Row Cardiac Rehab from 03/19/2020 in [facility] Cardiac and Pulmonary Rehab  Referring Provider [name] MD      Encounter Date: 04/15/2020  Check In:  Session Check In - 04/15/20 1009      Check-In   Supervising physician immediately available to respond to emergencies See telemetry face sheet for immediately available ER MD    Location [facility]-Cardiac & Pulmonary Rehab    Staff Present Earlean Shawl, BS, ACSM CEP, Exercise Physiologist;Joseph [place];Heath Lark, RN, BSN, CCRP    Virtual Visit No    Medication changes reported     No    Fall or balance concerns reported    No    Warm-up and Cool-down Performed on first and last piece of equipment    Resistance Training Performed Yes    VAD Patient? No    PAD/SET Patient? No      Pain Assessment   Currently in Pain? No/denies              Social History   Tobacco Use  Smoking Status Never Smoker  Smokeless Tobacco Never Used    Goals Met:  Independence with exercise equipment Exercise tolerated well No report of cardiac concerns or symptoms  Goals Unmet:  Not Applicable  Comments: Pt able to follow exercise prescription today without complaint.  Will continue to monitor for progression.    Dr. Emily Filbert is Medical Director for [place] and [facility].

## 2020-04-17 ENCOUNTER — Other Ambulatory Visit: Payer: Self-pay

## 2020-04-17 DIAGNOSIS — Z951 Presence of aortocoronary bypass graft: Secondary | ICD-10-CM

## 2020-04-17 NOTE — Progress Notes (Signed)
Daily Session Note  Patient Details  Name: Luis Wise MRN: [id number] Date of Birth: 17-Apr-1940 Referring Provider:   Flowsheet Row Cardiac Rehab from 03/19/2020 in [facility] Cardiac and Pulmonary Rehab  Referring Provider [name] MD      Encounter Date: 04/17/2020  Check In:  Session Check In - 04/17/20 0933      Check-In   Supervising physician immediately available to respond to emergencies See telemetry face sheet for immediately available ER MD    Location [facility]-Cardiac & Pulmonary Rehab    Staff Present Birdie Sons, MPA, RN;Amanda Oletta Darter, BA, ACSM CEP, Exercise Physiologist;Joseph Tessie Fass RCP,RRT,BSRT    Virtual Visit No    Medication changes reported     No    Fall or balance concerns reported    No    Warm-up and Cool-down Performed on first and last piece of equipment    Resistance Training Performed Yes    VAD Patient? No    PAD/SET Patient? No      Pain Assessment   Currently in Pain? No/denies              Social History   Tobacco Use  Smoking Status Never Smoker  Smokeless Tobacco Never Used    Goals Met:  Independence with exercise equipment Exercise tolerated well Personal goals reviewed No report of cardiac concerns or symptoms Strength training completed today  Goals Unmet:  Not Applicable  Comments: Pt able to follow exercise prescription today without complaint.  Will continue to monitor for progression.    Dr. Emily Filbert is Medical Director for [place] and [facility].

## 2020-04-19 ENCOUNTER — Other Ambulatory Visit: Payer: Self-pay

## 2020-04-19 DIAGNOSIS — Z951 Presence of aortocoronary bypass graft: Secondary | ICD-10-CM | POA: Diagnosis not present

## 2020-04-19 NOTE — Progress Notes (Signed)
Daily Session Note  Patient Details  Name: Luis Wise MRN: [id number] Date of Birth: 13-Aug-1940 Referring Provider:   Flowsheet Row Cardiac Rehab from 03/19/2020 in [facility] Cardiac and Pulmonary Rehab  Referring Provider [name] MD      Encounter Date: 04/19/2020  Check In:  Session Check In - 04/19/20 0925      Check-In   Supervising physician immediately available to respond to emergencies See telemetry face sheet for immediately available ER MD    Location [facility]-Cardiac & Pulmonary Rehab    Staff Present Birdie Sons, MPA, RN;Joseph Darrin Nipper, [place], RCEP, CCRP, CCET    Virtual Visit No    Medication changes reported     No    Fall or balance concerns reported    No    Warm-up and Cool-down Performed on first and last piece of equipment    Resistance Training Performed Yes    VAD Patient? No    PAD/SET Patient? No      Pain Assessment   Currently in Pain? No/denies              Social History   Tobacco Use  Smoking Status Never Smoker  Smokeless Tobacco Never Used    Goals Met:  Independence with exercise equipment Exercise tolerated well No report of cardiac concerns or symptoms Strength training completed today  Goals Unmet:  Not Applicable  Comments: Pt able to follow exercise prescription today without complaint.  Will continue to monitor for progression.    Dr. Emily Filbert is Medical Director for [name] and [facility].

## 2020-04-22 ENCOUNTER — Other Ambulatory Visit: Payer: Self-pay

## 2020-04-22 ENCOUNTER — Encounter: Payer: Self-pay | Admitting: Internal Medicine

## 2020-04-22 ENCOUNTER — Ambulatory Visit (INDEPENDENT_AMBULATORY_CARE_PROVIDER_SITE_OTHER): Payer: Medicare Other | Admitting: Internal Medicine

## 2020-04-22 VITALS — BP 140/80 | HR 70 | Ht 70.0 in | Wt 167.2 lb

## 2020-04-22 DIAGNOSIS — E1165 Type 2 diabetes mellitus with hyperglycemia: Secondary | ICD-10-CM | POA: Diagnosis not present

## 2020-04-22 DIAGNOSIS — E785 Hyperlipidemia, unspecified: Secondary | ICD-10-CM | POA: Diagnosis not present

## 2020-04-22 DIAGNOSIS — I2510 Atherosclerotic heart disease of native coronary artery without angina pectoris: Secondary | ICD-10-CM

## 2020-04-22 DIAGNOSIS — E1159 Type 2 diabetes mellitus with other circulatory complications: Secondary | ICD-10-CM | POA: Diagnosis not present

## 2020-04-22 NOTE — Patient Instructions (Addendum)
Please move: - Metformin 1500 mg with dinner  Continue: - Glimepiride 4 mg before breakfast - Farxiga 5 mg before breakfast  If sugars in am not <140, then add 2 mg of glimepiride before dinner.  Please return in 2-3 months with your sugar log.

## 2020-04-22 NOTE — Progress Notes (Signed)
Patient ID: Luis Wise, male   DOB: January 07, 1941, 80 y.o.   MRN: [id number]   This visit occurred during the SARS-CoV-2 public health emergency.  Safety protocols were in place, including screening questions prior to the visit, additional usage of staff PPE, and extensive cleaning of exam room while observing appropriate contact time as indicated for disinfecting solutions.   HPI: Luis Wise is a 80 y.o.-year-old male, initially referred by his PCP, Dr. Para March, returning for follow-up: DM2, dx in 2004-2005, non-insulin-dependent, uncontrolled, with complications (CAD - s/p CABG x5, CKD).  He is here with his wife, who is a former Nurse, learning disability and offers part of the history especially related to medications, blood sugars, and diet.  Patient's diabetes was better controlled before his CABG on 01/29/2020, after which his sugars increased to 200s.  We added Farxiga at last visit  Reviewed HbA1c levels: Lab Results  Component Value Date   HGBA1C 6.9 (A) 03/11/2020   HGBA1C 7.0 (H) 01/26/2020   HGBA1C 7.0 (H) 01/02/2020   HGBA1C 6.7 (A) 07/18/2019   HGBA1C 6.5 (A) 12/30/2018   HGBA1C 7.0 05/19/2018   HGBA1C 6.8 01/06/2018   HGBA1C 6.8 06/09/2017   HGBA1C 6.9 12/29/2016   HGBA1C 6.5 09/14/2016   Pt is on a regimen of: - Metformin 500 mg in am and 1000 mg in pm - Glimepiride 4 mg before breakfast - Farxiga 5 mg before breakfast-added 03/2020 She was also tried on Rybelsus but this was expensive.  He then tried Ozempic but he developed nausea and vomiting and then increase lipase on 02/27/2020 (82  >> 119) on the 0.5 mg dose.  Ozempic was stopped at that time. Prev. On Januvia >> tolerated well but expensive.  Pt checks his sugars once a day: - am: before Sx: 160; after Sx: 120-222 >> 138-177 but 190s if eats supper late - 2h after b'fast: n/c - before lunch: n/c >> 157, 271 - 2h after lunch: n/c - before dinner: before Sx: 100; after Sx: 76-163 >> 143, 144, 183 (orange) - 2h after dinner:  n/c >> 166, 181 - bedtime: n/c >> 161 - nighttime: n/c >> 135 Lowest sugar was 50s before sx; he has hypoglycemia awareness in the 60s.  Highest sugar was 265.  Glucometer: OneTouch ultra  Pt's meals are: - Breakfast: 1 egg + toast or cereal or oatmeal - Lunch: grilled fish + veggies - Dinner: meat + veggies - Snacks: 2: nuts, wheat crackers He walks 3 miles 3 days a week for exercise.  He is also in cardiac rehab.   -No CKD, last BUN/creatinine:  Lab Results  Component Value Date   BUN 22 03/06/2020   BUN 22 02/27/2020   CREATININE 1.25 03/06/2020   CREATININE 1.37 02/27/2020  On lisinopril 20 twice a day.  -+ HL; last set of lipids: Lab Results  Component Value Date   CHOL 122 04/11/2020   HDL 31 (L) 04/11/2020   LDLCALC 66 04/11/2020   LDLDIRECT 81.0 01/02/2020   TRIG 127 04/11/2020   CHOLHDL 3.9 04/11/2020  On atorvastatin 80 << pravastatin 20 mg daily, fenofibrate 160, co-Q10  On ASA 81.  - last eye exam was in 10/2019: No DR  - no numbness and tingling in his feet.  Pt has FH of DM in father in his 84s.  He is also on amiodarone.  ROS: Constitutional: no weight gain/no weight loss, no fatigue, no subjective hyperthermia, no subjective hypothermia, + nocturia  Eyes: no blurry vision, no  xerophthalmia ENT: no sore throat, no nodules palpated in neck, no dysphagia, no odynophagia, no hoarseness Cardiovascular: no CP/no SOB/no palpitations/no leg swelling Respiratory: no cough/no SOB/no wheezing Gastrointestinal: no N/no V/no D/no C/no acid reflux Musculoskeletal: no muscle aches/no joint aches Skin: no rashes, no hair loss Neurological: no tremors/no numbness/no tingling/no dizziness  I reviewed pt's medications, allergies, PMH, social hx, family hx, and changes were documented in the history of present illness. Otherwise, unchanged from my initial visit note.   Past Medical History:  Diagnosis Date  . Allergy    Amoxicillin  . Cataract 2017   Mild  progression  . Coronary artery disease    a. s/p 5-vessel CABG (LIMA-LAD, SVG-diag, sequential SVG-OM1-OM 2, & SVG-PDA)  . Diabetes mellitus with complication (HCC)   . Hyperlipidemia   . Hypertension   . Insomnia   . neuropathy    bilateral feet  . Postoperative atrial fibrillation [facility](HCC)    Past Surgical History:  Procedure Laterality Date  . BACK SURGERY    . CARDIAC CATHETERIZATION     01/19/2020  . CORONARY ARTERY BYPASS GRAFT N/A 01/29/2020   Procedure: CORONARY ARTERY BYPASS GRAFTING (CABG) TIMES FIVE USING LIMA to LAD; ENDOSCOPIC HARVESTED GREATER SAPHENOUS VEIN: SVG to PDA; SVG to sequenced OM1 & OM2.;  Surgeon: Kerin PernaVan Trigt, Peter, MD;  [facility];  Service: Open Heart Surgery;  Laterality: N/A;  . ENDOVEIN HARVEST OF GREATER SAPHENOUS VEIN Bilateral 01/29/2020   Procedure: ENDOVEIN HARVEST OF GREATER SAPHENOUS VEIN;  Surgeon: Kerin PernaVan Trigt, Peter, MD;  [facility];  Service: Open Heart Surgery;  Laterality: Bilateral;  . EYE SURGERY  Radial K   1997  . LAMINECTOMY  1993   L5-S1  . LEFT HEART CATH AND CORONARY ANGIOGRAPHY N/A 01/19/2020   Procedure: LEFT HEART CATH AND CORONARY ANGIOGRAPHY;  Surgeon: Iran OuchArida, Muhammad A, MD;  Location: [facility] INVASIVE CV LAB;  Service: Cardiovascular;  Laterality: N/A;  . MENISCUS REPAIR  1996  . TEE WITHOUT CARDIOVERSION N/A 01/29/2020   Procedure: TRANSESOPHAGEAL ECHOCARDIOGRAM (TEE);  Surgeon: Donata ClayVan Trigt, Theron AristaPeter, MD;  [facility];  Service: Open Heart Surgery;  Laterality: N/A;  . TONSILLECTOMY     Social History   Socioeconomic History  . Marital status: Married    Spouse name: Not on file  . Number of children: Not on file  . Years of education: Not on file  . Highest education level: Not on file  Occupational History  . Occupation: CFO  Tobacco Use  . Smoking status: Never Smoker  . Smokeless tobacco: Never Used  Vaping Use  . Vaping Use: Never used  Substance and Sexual Activity  . Alcohol use: Not Currently     Alcohol/week: 0.0 standard drinks    Comment: Very rare- occasionaly beer or wine   . Drug use: No  . Sexual activity: Yes    Birth control/protection: None  Other Topics Concern  . Not on file  Social History Narrative   Married 1970.   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   Chief of StaffCommercial Instrument rated pilot, land and sea.     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   Social Determinants of Health   Financial Resource Strain: Not on file  Food Insecurity: Not on file  Transportation Needs: Not on file  Physical Activity: Not on file  Stress: Not on file  Social Connections: Not on file  Intimate Partner Violence: Not on file   Current Outpatient Medications on File  Prior to Visit  Medication Sig Dispense Refill  . amiodarone (PACERONE) 200 MG tablet Take 1 tablet (200 mg total) by mouth daily. 90 tablet 3  . amLODipine (NORVASC) 10 MG tablet Take 1 tablet (10 mg total) by mouth daily. 90 tablet 1  . aspirin EC 81 MG tablet Take 1 tablet (81 mg total) by mouth daily. Swallow whole. 150 tablet 2  . atorvastatin (LIPITOR) 80 MG tablet Take 1 tablet (80 mg total) by mouth daily. 90 tablet 3  . carvedilol (COREG) 12.5 MG tablet Take 1 tablet (12.5 mg total) by mouth 2 (two) times daily. 180 tablet 3  . cholecalciferol (VITAMIN D3) 25 MCG (1000 UNIT) tablet Take 1,000 Units by mouth daily.    . Coenzyme Q10 (COQ10) 100 MG CAPS Take 100 mg by mouth daily.     . dapagliflozin propanediol (FARXIGA) 5 MG TABS tablet Take 1 tablet (5 mg total) by mouth daily before breakfast. 30 tablet 11  . fenofibrate 160 MG tablet Take 1 tablet (160 mg total) by mouth daily. 90 tablet 1  . ferrous sulfate 324 (65 Fe) MG TBEC Take 1 tablet (325 mg total) by mouth every Monday, Wednesday, and Friday.    Marland Kitchen glimepiride (AMARYL) 4 MG tablet TAKE 1 TABLET BY MOUTH EVERY DAY WITH BREAKFAST 90 tablet 1  . glucose blood (ONETOUCH ULTRA) test strip TEST 1-2x  a day 200 strip 3  . lisinopril (ZESTRIL) 20 MG tablet TAKE 1 TABLET BY MOUTH EVERY DAY 90 tablet 1  . lisinopril-hydrochlorothiazide (ZESTORETIC) 20-25 MG tablet Take 1 tablet by mouth daily. 90 tablet 1  . metFORMIN (GLUCOPHAGE) 1000 MG tablet 500mg  in the AM and 1000mg  in the PM    . Misc Natural Products (URINOZINC PO) Take 1 tablet by mouth in the morning and at bedtime.     . Multiple Vitamins-Minerals (PRESERVISION AREDS 2 PO) Take 1 tablet by mouth 2 (two) times daily.     . sildenafil (REVATIO) 20 MG tablet Take 1 tablet (20 mg total) by mouth 3 (three) times daily as needed. 90 tablet 3  . traZODone (DESYREL) 50 MG tablet TAKE 1 TO 1.5 TABLETS BY MOUTH EVERY NIGHT FOR SLEEP 135 tablet 3   No current facility-administered medications on file prior to visit.   Allergies  Allergen Reactions  . Ozempic (0.25 Or 0.5 Mg-Dose) [Semaglutide(0.25 Or 0.5mg -Dos)]     Presumed cause of GI upset.    Amoxil [Amoxicillin] Rash   Family History  Problem Relation Age of Onset  . Hypertension Mother   . Diabetes Father   . Cancer Neg Hx   . COPD Neg Hx   . Heart disease Neg Hx   . Hyperlipidemia Neg Hx   . Stroke Neg Hx   . Colon cancer Neg Hx   . Prostate cancer Neg Hx    PE: BP 140/80   Pulse 70   Ht 5\' 10"  (1.778 m)   Wt 167 lb 3.2 oz (75.8 kg)   SpO2 97%   BMI 23.99 kg/m  Wt Readings from Last 3 Encounters:  04/22/20 167 lb 3.2 oz (75.8 kg)  03/19/20 162 lb 6.4 oz (73.7 kg)  03/18/20 164 lb (74.4 kg)   Constitutional: normal weight, in NAD Eyes: PERRLA, EOMI, no exophthalmos ENT: moist mucous membranes, no thyromegaly, no cervical lymphadenopathy Cardiovascular: RRR, No MRG Respiratory: CTA B Gastrointestinal: abdomen soft, NT, ND, BS+ Musculoskeletal: no deformities, strength intact in all 4 Skin: moist, warm, no rashes Neurological: no  tremor with outstretched hands, DTR normal in all 4  ASSESSMENT: 1. DM2, non-insulin-dependent, uncontrolled, with  complications - CAD, s/p CABG x5 - CKD  2. HL  PLAN:  1. Patient with longstanding, uncontrolled, diabetes, on oral antidiabetic regimen with Metformin, sulfonylurea, to which we added an SGLT2 inhibitor at last visit (first visit with me).  Before our last visit, he had CABG and sugars were especially worse after the procedure.  PCP tried Rybelsus, but this was expensive for the patient.  He then tried Ozempic but he could not tolerate the higher dose, 0.5 mg weekly, due to nausea and vomiting and also  had a high lipase, at 82 >> 119, at which point Ozempic was stopped. - At last visit, we discussed about improving diet and he also accepted a referral to nutrition.  HbA1c was improved to 6.9%.  We added Farxiga at low-dose since he had a history of increased urination (nocturia 3 times a night).  I also advised him to check some blood sugars after dinner or at bedtime. -At today's visit, sugars have improved slightly, but they are still above target in the morning and midday and they improve later in the day.  He is tolerating [place] well, without a change in urination.  We plan to check another BMP at his next visit with PCP in less than a month.  For now, to improve his morning sugars, I advised him to move the entire Metformin dose at night, 1500 mg.  If this is not enough, I advised him to add 2 mg of glimepiride before dinner. -Upon patient's wife questioning, for now, I would suggest to hold off adding back Ozempic.  We may use it in the future, very cautiously, and only at a low dose, due to previously high lipase. - I suggested to:  Patient Instructions  Please move: - Metformin 1500 mg with dinner  Continue: - Glimepiride 4 mg before breakfast - Farxiga 5 mg before breakfast  If sugars in am not <140, then add 2 mg of glimepiride before dinner.  Please return in 2-3 months with your sugar log.   - advised to check sugars at different times of the day - 1x a day, rotating check  times - advised for yearly eye exams >> he is UTD - return to clinic in 3 months  2. HL - Reviewed latest lipid panel from earlier this month: Fractions at goal with exception of a slightly low HDL: Lab Results  Component Value Date   CHOL 122 04/11/2020   HDL 31 (L) 04/11/2020   LDLCALC 66 04/11/2020   LDLDIRECT 81.0 01/02/2020   TRIG 127 04/11/2020   CHOLHDL 3.9 04/11/2020  - Continues atorvastatin 80, fenofibrate 160 without side effects.  Carlus Pavlov, MD PhD [facility]

## 2020-04-23 ENCOUNTER — Encounter: Payer: Medicare Other | Admitting: *Deleted

## 2020-04-23 DIAGNOSIS — Z951 Presence of aortocoronary bypass graft: Secondary | ICD-10-CM

## 2020-04-23 NOTE — Progress Notes (Signed)
Daily Session Note  Patient Details  Name: Luis Wise MRN: [id number] Date of Birth: [birth year] Referring Provider:   Flowsheet Row Cardiac Rehab from 03/19/2020 in [facility] Cardiac and Pulmonary Rehab  Referring Provider [name] MD      Encounter Date: 04/23/2020  Check In:  Session Check In - 04/23/20 0930      Check-In   Supervising physician immediately available to respond to emergencies See telemetry face sheet for immediately available ER MD    Location [facility]-Cardiac & Pulmonary Rehab    Staff Present Heath Lark, RN, BSN, CCRP;Amanda Sommer, BA, ACSM CEP, Exercise Physiologist;Kara Eliezer Bottom, MS Exercise Physiologist    Virtual Visit No    Medication changes reported     No    Fall or balance concerns reported    No    Warm-up and Cool-down Performed on first and last piece of equipment    Resistance Training Performed Yes    VAD Patient? No    PAD/SET Patient? No      Pain Assessment   Currently in Pain? No/denies              Social History   Tobacco Use  Smoking Status Never Smoker  Smokeless Tobacco Never Used    Goals Met:  Independence with exercise equipment Exercise tolerated well No report of cardiac concerns or symptoms  Goals Unmet:  Not Applicable  Comments: Pt able to follow exercise prescription today without complaint.  Will continue to monitor for progression.    Dr. Emily Filbert is Medical Director for [place] and [facility].

## 2020-04-24 ENCOUNTER — Encounter: Payer: Self-pay | Admitting: Internal Medicine

## 2020-04-24 ENCOUNTER — Other Ambulatory Visit: Payer: Self-pay

## 2020-04-24 DIAGNOSIS — Z951 Presence of aortocoronary bypass graft: Secondary | ICD-10-CM

## 2020-04-24 NOTE — Progress Notes (Signed)
Daily Session Note  Patient Details  Name: EDWAR COE MRN: [id number] Date of Birth: April 18, 1940 Referring Provider:   Flowsheet Row Cardiac Rehab from 03/19/2020 in [facility] Cardiac and Pulmonary Rehab  Referring Provider [name] MD      Encounter Date: 04/24/2020  Check In:  Session Check In - 04/24/20 1003      Check-In   Supervising physician immediately available to respond to emergencies See telemetry face sheet for immediately available ER MD    Location [facility]-Cardiac & Pulmonary Rehab    Staff Present Birdie Sons, MPA, Elveria Rising, BA, ACSM CEP, Exercise Physiologist;Joseph Tessie Fass RCP,RRT,BSRT    Virtual Visit No    Medication changes reported     No    Fall or balance concerns reported    No    Warm-up and Cool-down Performed on first and last piece of equipment    Resistance Training Performed Yes    VAD Patient? No    PAD/SET Patient? No      Pain Assessment   Currently in Pain? No/denies              Social History   Tobacco Use  Smoking Status Never Smoker  Smokeless Tobacco Never Used    Goals Met:  Independence with exercise equipment Exercise tolerated well No report of cardiac concerns or symptoms Strength training completed today  Goals Unmet:  Not Applicable  Comments: Pt able to follow exercise prescription today without complaint.  Will continue to monitor for progression.    Dr. Emily Filbert is Medical Director for [place] and [facility].

## 2020-04-26 ENCOUNTER — Other Ambulatory Visit: Payer: Self-pay

## 2020-04-26 DIAGNOSIS — Z951 Presence of aortocoronary bypass graft: Secondary | ICD-10-CM | POA: Diagnosis not present

## 2020-04-26 NOTE — Progress Notes (Signed)
Daily Session Note  Patient Details  Name: Luis Wise MRN: [id number] Date of Birth: 08/08/1940 Referring Provider:   Flowsheet Row Cardiac Rehab from 03/19/2020 in [facility] Cardiac and Pulmonary Rehab  Referring Provider [name] MD      Encounter Date: 04/26/2020  Check In:  Session Check In - 04/26/20 0929      Check-In   Supervising physician immediately available to respond to emergencies See telemetry face sheet for immediately available ER MD    Location [facility]-Cardiac & Pulmonary Rehab    Staff Present Birdie Sons, MPA, RN;Joseph Darrin Nipper, [place], RCEP, CCRP, CCET    Virtual Visit No    Medication changes reported     No    Fall or balance concerns reported    No    Warm-up and Cool-down Performed on first and last piece of equipment    Resistance Training Performed Yes    VAD Patient? No    PAD/SET Patient? No      Pain Assessment   Currently in Pain? No/denies              Social History   Tobacco Use  Smoking Status Never Smoker  Smokeless Tobacco Never Used    Goals Met:  Independence with exercise equipment Exercise tolerated well No report of cardiac concerns or symptoms Strength training completed today  Goals Unmet:  Not Applicable  Comments: Pt able to follow exercise prescription today without complaint.  Will continue to monitor for progression.    Dr. Emily Filbert is Medical Director for [place] and [facility].

## 2020-04-29 ENCOUNTER — Encounter: Payer: Medicare Other | Admitting: *Deleted

## 2020-04-29 ENCOUNTER — Other Ambulatory Visit: Payer: Self-pay

## 2020-04-29 DIAGNOSIS — Z951 Presence of aortocoronary bypass graft: Secondary | ICD-10-CM | POA: Diagnosis not present

## 2020-04-29 NOTE — Progress Notes (Signed)
Daily Session Note  Patient Details  Name: Luis Wise MRN: [id number] Date of Birth: 1940/08/11 Referring Provider:   Flowsheet Row Cardiac Rehab from 03/19/2020 in [facility] Cardiac and Pulmonary Rehab  Referring Provider [name] MD      Encounter Date: 04/29/2020  Check In:  Session Check In - 04/29/20 0957      Check-In   Supervising physician immediately available to respond to emergencies See telemetry face sheet for immediately available ER MD    Location [facility]-Cardiac & Pulmonary Rehab    Staff Present Heath Lark, RN, BSN, CCRP;Joseph Hood RCP,RRT,BSRT;Kelly Rutledge, [place], ACSM CEP, Exercise Physiologist    Virtual Visit No    Medication changes reported     No    Fall or balance concerns reported    No    Warm-up and Cool-down Performed on first and last piece of equipment    Resistance Training Performed Yes    VAD Patient? No    PAD/SET Patient? No      Pain Assessment   Currently in Pain? No/denies              Social History   Tobacco Use  Smoking Status Never Smoker  Smokeless Tobacco Never Used    Goals Met:  Independence with exercise equipment Exercise tolerated well No report of cardiac concerns or symptoms  Goals Unmet:  Not Applicable  Comments: Pt able to follow exercise prescription today without complaint.  Will continue to monitor for progression.    Dr. Emily Filbert is Medical Director for [place] and [facility].

## 2020-05-01 ENCOUNTER — Encounter: Payer: Self-pay | Admitting: *Deleted

## 2020-05-01 ENCOUNTER — Other Ambulatory Visit: Payer: Self-pay

## 2020-05-01 DIAGNOSIS — Z951 Presence of aortocoronary bypass graft: Secondary | ICD-10-CM

## 2020-05-01 NOTE — Progress Notes (Signed)
Daily Session Note  Patient Details  Name: Luis Wise MRN: [id number] Date of Birth: 06-09-1940 Referring Provider:   Flowsheet Row Cardiac Rehab from 03/19/2020 in [facility] Cardiac and Pulmonary Rehab  Referring Provider [name] MD      Encounter Date: 05/01/2020  Check In:  Session Check In - 05/01/20 1001      Check-In   Supervising physician immediately available to respond to emergencies See telemetry face sheet for immediately available ER MD    Location [facility]-Cardiac & Pulmonary Rehab    Staff Present Birdie Sons, MPA, Elveria Rising, BA, ACSM CEP, Exercise Physiologist;Joseph Tessie Fass RCP,RRT,BSRT    Virtual Visit No    Medication changes reported     No    Fall or balance concerns reported    No    Warm-up and Cool-down Performed on first and last piece of equipment    Resistance Training Performed Yes    VAD Patient? No    PAD/SET Patient? No      Pain Assessment   Currently in Pain? No/denies              Social History   Tobacco Use  Smoking Status Never Smoker  Smokeless Tobacco Never Used    Goals Met:  Independence with exercise equipment Exercise tolerated well No report of cardiac concerns or symptoms Strength training completed today  Goals Unmet:  Not Applicable  Comments: Pt able to follow exercise prescription today without complaint.  Will continue to monitor for progression.    Dr. Emily Filbert is Medical Director for [place] and [facility].

## 2020-05-01 NOTE — Progress Notes (Signed)
Cardiac Individual Treatment Plan  Patient Details  Name: Luis Wise MRN: [id number] Date of Birth: Feb 08, 1941 Referring Provider:   Flowsheet Row Cardiac Rehab from 03/19/2020 in [facility] Cardiac and Pulmonary Rehab  Referring Provider [name] MD      Initial Encounter Date:  Flowsheet Row Cardiac Rehab from 03/19/2020 in [facility] Cardiac and Pulmonary Rehab  Date 03/19/20      Visit Diagnosis: S/P CABG x 5  Patient's Home Medications on Admission:  Current Outpatient Medications:  .  amiodarone (PACERONE) 200 MG tablet, Take 1 tablet (200 mg total) by mouth daily., Disp: 90 tablet, Rfl: 3 .  amLODipine (NORVASC) 10 MG tablet, Take 1 tablet (10 mg total) by mouth daily., Disp: 90 tablet, Rfl: 1 .  aspirin EC 81 MG tablet, Take 1 tablet (81 mg total) by mouth daily. Swallow whole., Disp: 150 tablet, Rfl: 2 .  atorvastatin (LIPITOR) 80 MG tablet, Take 1 tablet (80 mg total) by mouth daily., Disp: 90 tablet, Rfl: 3 .  carvedilol (COREG) 12.5 MG tablet, Take 1 tablet (12.5 mg total) by mouth 2 (two) times daily., Disp: 180 tablet, Rfl: 3 .  cholecalciferol (VITAMIN D3) 25 MCG (1000 UNIT) tablet, Take 1,000 Units by mouth daily., Disp: , Rfl:  .  Coenzyme Q10 (COQ10) 100 MG CAPS, Take 100 mg by mouth daily. , Disp: , Rfl:  .  dapagliflozin propanediol (FARXIGA) 5 MG TABS tablet, Take 1 tablet (5 mg total) by mouth daily before breakfast., Disp: 30 tablet, Rfl: 11 .  fenofibrate 160 MG tablet, Take 1 tablet (160 mg total) by mouth daily., Disp: 90 tablet, Rfl: 1 .  ferrous sulfate 324 (65 Fe) MG TBEC, Take 1 tablet (325 mg total) by mouth every Monday, Wednesday, and Friday., Disp: , Rfl:  .  glimepiride (AMARYL) 4 MG tablet, TAKE 1 TABLET BY MOUTH EVERY DAY WITH BREAKFAST, Disp: 90 tablet, Rfl: 1 .  glucose blood (ONETOUCH ULTRA) test strip, TEST 1-2x a day, Disp: 200 strip, Rfl: 3 .  lisinopril (ZESTRIL) 20 MG tablet, TAKE 1 TABLET BY MOUTH EVERY DAY, Disp: 90 tablet, Rfl: 1 .   lisinopril-hydrochlorothiazide (ZESTORETIC) 20-25 MG tablet, Take 1 tablet by mouth daily., Disp: 90 tablet, Rfl: 1 .  metFORMIN (GLUCOPHAGE) 1000 MG tablet, $RemoveBe'500mg'HgORHrMwe$  in the AM and $Remo'1000mg'CDeRr$  in the PM, Disp: , Rfl:  .  Misc Natural Products (URINOZINC PO), Take 1 tablet by mouth in the morning and at bedtime. , Disp: , Rfl:  .  Multiple Vitamins-Minerals (PRESERVISION AREDS 2 PO), Take 1 tablet by mouth 2 (two) times daily. , Disp: , Rfl:  .  traZODone (DESYREL) 50 MG tablet, TAKE 1 TO 1.5 TABLETS BY MOUTH EVERY NIGHT FOR SLEEP, Disp: 135 tablet, Rfl: 3  Past Medical History: Past Medical History:  Diagnosis Date  . Allergy    Amoxicillin  . Cataract 2017   Mild progression  . Coronary artery disease    a. s/p 5-vessel CABG (LIMA-LAD, SVG-diag, sequential SVG-OM1-OM 2, & SVG-PDA)  . Diabetes mellitus with complication ([place])   . Hyperlipidemia   . Hypertension   . Insomnia   . neuropathy    bilateral feet  . Postoperative atrial fibrillation (HCC)     Tobacco Use: Social History   Tobacco Use  Smoking Status Never Smoker  Smokeless Tobacco Never Used    Labs: Recent Review Flowsheet Data    Labs for ITP Cardiac and Pulmonary Rehab Latest Ref Rng & Units 01/29/2020 01/29/2020 01/31/2020 03/11/2020 04/11/2020  Cholestrol 0 - 200 mg/dL - - - - 122   LDLCALC 0 - 99 mg/dL - - - - 66   LDLDIRECT mg/dL - - - - -   HDL >40 mg/dL - - - - 31(L)   Trlycerides <150 mg/dL - - - - 127   Hemoglobin A1c 4.0 - 5.6 % - - - 6.9(A) -   PHART 7.350 - 7.450 7.541(H) 7.386 - - -   PCO2ART 32.0 - 48.0 mmHg 22.8(L) 38.0 - - -   HCO3 20.0 - 28.0 mmol/L 19.6(L) 22.7 - - -   TCO2 22 - 32 mmol/L 20(L) 24 - - -   ACIDBASEDEF 0.0 - 2.0 mmol/L 2.0 2.0 - - -   O2SAT % 98.0 90.0 73.7 - -       Exercise Target Goals: Exercise Program Goal: Individual exercise prescription set using results from initial 6 min walk test and THRR while considering  patient's activity barriers and safety.   Exercise  Prescription Goal: Initial exercise prescription builds to 30-45 minutes a day of aerobic activity, 2-3 days per week.  Home exercise guidelines will be given to patient during program as part of exercise prescription that the participant will acknowledge.   Education: Aerobic Exercise: - Group verbal and visual presentation on the components of exercise prescription. Introduces F.I.T.T principle from ACSM for exercise prescriptions.  Reviews F.I.T.T. principles of aerobic exercise including progression. Written material given at graduation. Flowsheet Row Cardiac Rehab from 04/24/2020 in [facility] Cardiac and Pulmonary Rehab  Date 04/24/20  Educator jh  Instruction Review Code 1- Verbalizes Understanding      Education: Resistance Exercise: - Group verbal and visual presentation on the components of exercise prescription. Introduces F.I.T.T principle from ACSM for exercise prescriptions  Reviews F.I.T.T. principles of resistance exercise including progression. Written material given at graduation.    Education: Exercise & Equipment Safety: - Individual verbal instruction and demonstration of equipment use and safety with use of the equipment. Flowsheet Row Cardiac Rehab from 04/24/2020 in [facility] Cardiac and Pulmonary Rehab  Date 03/18/20  Educator [facility]  Instruction Review Code 1- Verbalizes Understanding      Education: Exercise Physiology & General Exercise Guidelines: - Group verbal and written instruction with models to review the exercise physiology of the cardiovascular system and associated critical values. Provides general exercise guidelines with specific guidelines to those with heart or lung disease.  Flowsheet Row Cardiac Rehab from 04/24/2020 in [facility] Cardiac and Pulmonary Rehab  Date 04/17/20  Educator [facility]  Instruction Review Code 1- Verbalizes Understanding      Education: Flexibility, Balance, Mind/Body Relaxation: - Group verbal and visual presentation with interactive activity  on the components of exercise prescription. Introduces F.I.T.T principle from ACSM for exercise prescriptions. Reviews F.I.T.T. principles of flexibility and balance exercise training including progression. Also discusses the mind body connection.  Reviews various relaxation techniques to help reduce and manage stress (i.e. Deep breathing, progressive muscle relaxation, and visualization). Balance handout provided to take home. Written material given at graduation.   Activity Barriers & Risk Stratification:  Activity Barriers & Cardiac Risk Stratification - 03/19/20 1018      Activity Barriers & Cardiac Risk Stratification   Activity Barriers None    Cardiac Risk Stratification High           6 Minute Walk:  6 Minute Walk    Row Name 03/19/20 1018         6 Minute Walk   Phase Initial  Distance 1540 feet     Walk Time 6 minutes     # of Rest Breaks 0     MPH 2.92     METS 3.17     RPE 11     VO2 Peak 11.1     Symptoms No     Resting HR 75 bpm     Resting BP 124/74     Resting Oxygen Saturation  99 %     Exercise Oxygen Saturation  during 6 min walk 99 %     Max Ex. HR 86 bpm     Max Ex. BP 136/60     2 Minute Post BP 126/58            Oxygen Initial Assessment:   Oxygen Re-Evaluation:   Oxygen Discharge (Final Oxygen Re-Evaluation):   Initial Exercise Prescription:  Initial Exercise Prescription - 03/19/20 1000      Date of Initial Exercise RX and Referring Provider   Date 03/19/20    Referring Provider [name] MD      Treadmill   MPH 2.7    Grade 1    Minutes 15    METs 3.44      Recumbant Bike   Level 2    RPM 50    Watts 27    Minutes 15    METs 3.1      NuStep   Level 3    SPM 80    Minutes 15    METs 3.1      Recumbant Elliptical   Level 2    RPM 50    Minutes 15    METs 3      Elliptical   Level 1    Speed 2.9    Minutes 15      Prescription Details   Frequency (times per week) 3    Duration Progress to 30  minutes of continuous aerobic without signs/symptoms of physical distress      Intensity   THRR 40-80% of Max Heartrate 102-129    Ratings of Perceived Exertion 11-13    Perceived Dyspnea 0-4      Progression   Progression Continue to progress workloads to maintain intensity without signs/symptoms of physical distress.      Resistance Training   Weight 4 lb    Reps 10-15           Perform Capillary Blood Glucose checks as needed.  Exercise Prescription Changes:  Exercise Prescription Changes    Row Name 03/19/20 1000 03/27/20 0800 04/08/20 1500 04/22/20 1100       Response to Exercise   Blood Pressure (Admit) 124/74 138/66 166/74 110/56    Blood Pressure (Exercise) 136/60 138/56 128/62 120/64    Blood Pressure (Exit) 126/58 126/64 120/60 116/74    Heart Rate (Admit) 75 bpm 75 bpm 70 bpm 70 bpm    Heart Rate (Exercise) 86 bpm 95 bpm 95 bpm 91 bpm    Heart Rate (Exit) 75 bpm 82 bpm 77 bpm 73 bpm    Oxygen Saturation (Admit) 99 % -- -- --    Oxygen Saturation (Exercise) 99 % -- -- --    Rating of Perceived Exertion (Exercise) 11 11 13 16     Symptoms none none none none    Comments walk test results first day -- --    Duration -- Progress to 30 minutes of  aerobic without signs/symptoms of physical distress Continue with 30 min of aerobic exercise without signs/symptoms of  physical distress. Continue with 30 min of aerobic exercise without signs/symptoms of physical distress.    Intensity -- THRR unchanged THRR unchanged THRR unchanged         Progression   Progression -- Continue to progress workloads to maintain intensity without signs/symptoms of physical distress. Continue to progress workloads to maintain intensity without signs/symptoms of physical distress. Continue to progress workloads to maintain intensity without signs/symptoms of physical distress.    Average METs -- 2.9 3.55 3.26         Resistance Training   Weight -- 4 lb 5 lb 5 lb    Reps -- 10-15 10-15  10-15         Interval Training   Interval Training -- No No No         Treadmill   MPH -- 3 3 2.8    Grade -- $Remove'1 1 2    'UooCODJ$ Minutes -- $RemoveBe'15 15 15    'kvoZZFNGB$ METs -- 3.71 3.71 3.92         Recumbant Bike   Level -- -- 3.5 --    Minutes -- -- 15 --    METs -- -- 4 --         NuStep   Level -- -- 5 --    Minutes -- -- 15 --    METs -- -- 3.2 --         Recumbant Elliptical   Level -- $Remove'2 4 3    'lFxprUt$ RPM -- 50 -- --    Minutes -- $RemoveBe'15 15 15    'OwbbzpKAx$ METs -- 2.1 3.3 2.4         Home Exercise Plan   Plans to continue exercise at -- -- Home (comment)  walking Home (comment)  walking    Frequency -- -- Add 3 additional days to program exercise sessions. Add 3 additional days to program exercise sessions.    Initial Home Exercises Provided -- -- 04/03/20 04/03/20           Exercise Comments:  Exercise Comments    Row Name 03/22/20 0926           Exercise Comments First full day of exercise!  Patient was oriented to gym and equipment including functions, settings, policies, and procedures.  Patient's individual exercise prescription and treatment plan were reviewed.  All starting workloads were established based on the results of the 6 minute walk test done at initial orientation visit.  The plan for exercise progression was also introduced and progression will be customized based on patient's performance and goals.              Exercise Goals and Review:  Exercise Goals    Row Name 03/19/20 1021             Exercise Goals   Increase Physical Activity Yes       Intervention Provide advice, education, support and counseling about physical activity/exercise needs.;Develop an individualized exercise prescription for aerobic and resistive training based on initial evaluation findings, risk stratification, comorbidities and participant's personal goals.       Expected Outcomes Short Term: Attend rehab on a regular basis to increase amount of physical activity.;Long Term: Add in home exercise to  make exercise part of routine and to increase amount of physical activity.;Long Term: Exercising regularly at least 3-5 days a week.       Increase Strength and Stamina Yes       Intervention Provide advice, education, support and counseling  about physical activity/exercise needs.;Develop an individualized exercise prescription for aerobic and resistive training based on initial evaluation findings, risk stratification, comorbidities and participant's personal goals.       Expected Outcomes Short Term: Perform resistance training exercises routinely during rehab and add in resistance training at home;Short Term: Increase workloads from initial exercise prescription for resistance, speed, and METs.;Long Term: Improve cardiorespiratory fitness, muscular endurance and strength as measured by increased METs and functional capacity (6MWT)       Able to understand and use rate of perceived exertion (RPE) scale Yes       Intervention Provide education and explanation on how to use RPE scale       Expected Outcomes Short Term: Able to use RPE daily in rehab to express subjective intensity level;Long Term:  Able to use RPE to guide intensity level when exercising independently       Able to understand and use Dyspnea scale Yes       Intervention Provide education and explanation on how to use Dyspnea scale       Expected Outcomes Short Term: Able to use Dyspnea scale daily in rehab to express subjective sense of shortness of breath during exertion;Long Term: Able to use Dyspnea scale to guide intensity level when exercising independently       Knowledge and understanding of Target Heart Rate Range (THRR) Yes       Intervention Provide education and explanation of THRR including how the numbers were predicted and where they are located for reference       Expected Outcomes Short Term: Able to state/look up THRR;Short Term: Able to use daily as guideline for intensity in rehab;Long Term: Able to use THRR to govern  intensity when exercising independently       Able to check pulse independently Yes       Intervention Provide education and demonstration on how to check pulse in carotid and radial arteries.;Review the importance of being able to check your own pulse for safety during independent exercise       Expected Outcomes Short Term: Able to explain why pulse checking is important during independent exercise;Long Term: Able to check pulse independently and accurately       Understanding of Exercise Prescription Yes       Intervention Provide education, explanation, and written materials on patient's individual exercise prescription       Expected Outcomes Short Term: Able to explain program exercise prescription;Long Term: Able to explain home exercise prescription to exercise independently              Exercise Goals Re-Evaluation :  Exercise Goals Re-Evaluation    Row Name 03/22/20 0926 04/08/20 1459 04/17/20 0928 04/22/20 1120       Exercise Goal Re-Evaluation   Exercise Goals Review Increase Physical Activity;Able to understand and use rate of perceived exertion (RPE) scale;Knowledge and understanding of Target Heart Rate Range (THRR);Understanding of Exercise Prescription;Increase Strength and Stamina;Able to understand and use Dyspnea scale;Able to check pulse independently Increase Physical Activity;Increase Strength and Stamina;Understanding of Exercise Prescription Increase Physical Activity;Increase Strength and Stamina;Understanding of Exercise Prescription Increase Physical Activity;Increase Strength and Stamina    Comments Reviewed RPE and dyspnea scales, THR and program prescription with pt today.  Pt voiced understanding and was given a copy of goals to take home. Nickey is off to a good start in rehab.  He is already up to level 4 on the recumbent elliptical and using 5 lb weights.  We will  continue to monitor his progress. He reports having an eliipital at home that he hasn't used since  starting rehab. When the weather is good he will walk 3 miles in a park [place] 3x/week - he also hasn't done that since starting rehab due to the weather. Sie attends consistently.  He doesnt reach THR range (likely due to beta blocker) but works at RPE 12-16.  We will continue to monitor progress.    Expected Outcomes Short: Use RPE daily to regulate intensity. Long: Follow program prescription in THR. Short: Add resistance to recumbent bike Long: Continue to improve stamina Short: Exercise 2x at home when not at rehab on elliptical Long: Continue to improve stamina Short:  continue to exercise consistently Long:  improve overall stamina           Discharge Exercise Prescription (Final Exercise Prescription Changes):  Exercise Prescription Changes - 04/22/20 1100      Response to Exercise   Blood Pressure (Admit) 110/56    Blood Pressure (Exercise) 120/64    Blood Pressure (Exit) 116/74    Heart Rate (Admit) 70 bpm    Heart Rate (Exercise) 91 bpm    Heart Rate (Exit) 73 bpm    Rating of Perceived Exertion (Exercise) 16    Symptoms none    Duration Continue with 30 min of aerobic exercise without signs/symptoms of physical distress.    Intensity THRR unchanged      Progression   Progression Continue to progress workloads to maintain intensity without signs/symptoms of physical distress.    Average METs 3.26      Resistance Training   Weight 5 lb    Reps 10-15      Interval Training   Interval Training No      Treadmill   MPH 2.8    Grade 2    Minutes 15    METs 3.92      Recumbant Elliptical   Level 3    Minutes 15    METs 2.4      Home Exercise Plan   Plans to continue exercise at Home (comment)   walking   Frequency Add 3 additional days to program exercise sessions.    Initial Home Exercises Provided 04/03/20           Nutrition:  Target Goals: Understanding of nutrition guidelines, daily intake of sodium 1500mg , cholesterol 200mg , calories 30% from fat  and 7% or less from saturated fats, daily to have 5 or more servings of fruits and vegetables.  Education: All About Nutrition: -Group instruction provided by verbal, written material, interactive activities, discussions, models, and posters to present general guidelines for heart healthy nutrition including fat, fiber, MyPlate, the role of sodium in heart healthy nutrition, utilization of the nutrition label, and utilization of this knowledge for meal planning. Follow up email sent as well. Written material given at graduation. Flowsheet Row Cardiac Rehab from 04/24/2020 in [facility] Cardiac and Pulmonary Rehab  Education need identified 03/19/20  Date 03/21/20  Educator [facility]  Instruction Review Code 1- Verbalizes Understanding      Biometrics:  Pre Biometrics - 03/19/20 1022      Pre Biometrics   Height 5' 10.4" (1.788 m)    Weight 162 lb 6.4 oz (73.7 kg)    BMI (Calculated) 23.04    Single Leg Stand 30 seconds            Nutrition Therapy Plan and Nutrition Goals:  Nutrition Therapy & Goals - 03/19/20 7964  Nutrition Therapy   Diet heart healthy, low Na    Protein (specify units) 60g    Fiber 30 grams    Whole Grain Foods 3 servings    Saturated Fats 12 max. 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 Down about 10 lbs, weight stable now. B: 1 egg with toast (whole wheat) or cereal (oatmeal - rolled oats) with brown sugar and walnuts. Decaf cooffee with almond milk sweetener. L: Warden/ranger - grilled flounder with salad and vegetable or leftovers or lean cuisine "jazzed up" S: apple or peanuts or crackers with peanut butter D: light supper: favorite is shrimp salad, chicken, fish, brown rice, vegetables (broccoli, green beans, beans), bean soup. uses olive oil and canola  oil. "50% salt" seasoning and mrs dash (low salt). Drinks:unsweet tea with slenda, fresca. Met with RD at [facility]. They are including more beans and are reading Dr. Maurine Minister book and cookbook, discussed his philosophy. Discussed how eating mostly whole plant foods can help with heart health and diabetes. Discussed heart healthy eaitng and diabetes friendly eating.      Intervention Plan   Intervention Prescribe, educate and counsel regarding individualized specific dietary modifications aiming towards targeted core components such as weight, hypertension, lipid management, diabetes, heart failure and other comorbidities.;Nutrition handout(s) given to patient.    Expected Outcomes Short Term Goal: Understand basic principles of dietary content, such as calories, fat, sodium, cholesterol and nutrients.;Short Term Goal: A plan has been developed with personal nutrition goals set during dietitian appointment.;Long Term Goal: Adherence to prescribed nutrition plan.           Nutrition Assessments:  MEDIFICTS Score Key:  ?70 Need to make dietary changes   40-70 Heart Healthy Diet  ? 40 Therapeutic Level Cholesterol Diet  Flowsheet Row Cardiac Rehab from 03/19/2020 in [facility] Cardiac and Pulmonary Rehab  Picture Your Plate Total Score on Admission 81     Picture Your Plate Scores:  <16 Unhealthy dietary pattern with much room for improvement.  41-50 Dietary pattern unlikely to meet recommendations for good health and room for improvement.  51-60 More healthful dietary pattern, with some room for improvement.   >60 Healthy dietary pattern, although there may be some specific behaviors that could be improved.    Nutrition Goals Re-Evaluation:  Nutrition Goals Re-Evaluation    [place] Name 04/17/20 0932             Goals   Nutrition Goal ST: eat more plant centered meals, try different meals in cookbooks LT: include 8 fruit and vegetable servings with variety       Comment They have been  eating beans with some meat in it as well as some salads to include more plants- he and his wife used to live in [place] so they are used to having beans. He will have grilled fish often. He reports not experimented just yet - but they have started to go through more cookbooks.       Expected Outcome ST: eat more plant centered meals, try different meals in cookbooks LT: include 8 fruit and vegetable servings with variety              Nutrition Goals Discharge (Final Nutrition Goals Re-Evaluation):  Nutrition Goals Re-Evaluation -  04/17/20 0932      Goals   Nutrition Goal ST: eat more plant centered meals, try different meals in cookbooks LT: include 8 fruit and vegetable servings with variety    Comment They have been eating beans with some meat in it as well as some salads to include more plants- he and his wife used to live in [place] so they are used to having beans. He will have grilled fish often. He reports not experimented just yet - but they have started to go through more cookbooks.    Expected Outcome ST: eat more plant centered meals, try different meals in cookbooks LT: include 8 fruit and vegetable servings with variety           Psychosocial: Target Goals: Acknowledge presence or absence of significant depression and/or stress, maximize coping skills, provide positive support system. Participant is able to verbalize types and ability to use techniques and skills needed for reducing stress and depression.   Education: Stress, Anxiety, and Depression - Group verbal and visual presentation to define topics covered.  Reviews how body is impacted by stress, anxiety, and depression.  Also discusses healthy ways to reduce stress and to treat/manage anxiety and depression.  Written material given at graduation.   Education: Sleep Hygiene -Provides group verbal and written instruction about how sleep can affect your health.  Define sleep hygiene, discuss sleep cycles and impact of  sleep habits. Review good sleep hygiene tips.    Initial Review & Psychosocial Screening:  Initial Psych Review & Screening - 03/18/20 1329      Initial Review   Current issues with None Identified      Family Dynamics   Good Support System? Yes    Comments He can look to his wife and his son that lives close by. Kristoph has a positive outlook on his health since he has got a good report form the doctor.      Barriers   Psychosocial barriers to participate in program The patient should benefit from training in stress management and relaxation.;There are no identifiable barriers or psychosocial needs.      Screening Interventions   Interventions Encouraged to exercise;To provide support and resources with identified psychosocial needs;Provide feedback about the scores to participant    Expected Outcomes Short Term goal: Utilizing psychosocial counselor, staff and physician to assist with identification of specific Stressors or current issues interfering with healing process. Setting desired goal for each stressor or current issue identified.;Long Term Goal: Stressors or current issues are controlled or eliminated.;Short Term goal: Identification and review with participant of any Quality of Life or Depression concerns found by scoring the questionnaire.;Long Term goal: The participant improves quality of Life and PHQ9 Scores as seen by post scores and/or verbalization of changes           Quality of Life Scores:   Quality of Life - 03/19/20 1022      Quality of Life   Select Quality of Life      Quality of Life Scores   Health/Function Pre 23.17 %    Socioeconomic Pre 24 %    Psych/Spiritual Pre 24 %    Family Pre 28.8 %    GLOBAL Pre 24.33 %          Scores of 19 and below usually indicate a poorer quality of life in these areas.  A difference of  2-3 points is a clinically meaningful difference.  A difference of 2-3 points in the total  score of the Quality of Life Index has been  associated with significant improvement in overall quality of life, self-image, physical symptoms, and general health in studies assessing change in quality of life.  PHQ-9: Recent Review Flowsheet Data    Depression screen [facility] 2/9 03/19/2020 03/11/2020 01/11/2020 06/13/2018 06/09/2017   Decreased Interest 0 0 0 0 0   Down, Depressed, Hopeless 0 0 0 0 0   PHQ - 2 Score 0 0 0 0 0   Altered sleeping 0 - - - -   Tired, decreased energy 0 - - - -   Change in appetite 0 - - - -   Feeling bad or failure about yourself  0 - - - -   Trouble concentrating 0 - - - -   Moving slowly or fidgety/restless 0 - - - -   Suicidal thoughts 0 - - - -   PHQ-9 Score 0 - - - -   Difficult doing work/chores Not difficult at all - - - -     Interpretation of Total Score  Total Score Depression Severity:  1-4 = Minimal depression, 5-9 = Mild depression, 10-14 = Moderate depression, 15-19 = Moderately severe depression, 20-27 = Severe depression   Psychosocial Evaluation and Intervention:  Psychosocial Evaluation - 03/18/20 1330      Psychosocial Evaluation & Interventions   Interventions Encouraged to exercise with the program and follow exercise prescription    Comments He can look to his wife and his son that lives close by. Joanathan has a positive outlook on his health since he has got a good report form the doctor.    Expected Outcomes Short: Exercise regularly to support mental health and notify staff of any changes. Long: maintain mental health and well being through teaching of rehab or prescribed medications independently.    Continue Psychosocial Services  Follow up required by staff           Psychosocial Re-Evaluation:  Psychosocial Re-Evaluation    Worth Name 04/17/20 [phone]             Psychosocial Re-Evaluation   Current issues with None Identified       Comments He reports feeling great and feels extremely thankful. He reports his wife is a great support system for him as well as his work friends  and church community.       Expected Outcomes ST/LT: continue to maintain positive attitude and support system       Interventions Encouraged to attend Cardiac Rehabilitation for the exercise       Continue Psychosocial Services  Follow up required by staff              Psychosocial Discharge (Final Psychosocial Re-Evaluation):  Psychosocial Re-Evaluation - 04/17/20 0936      Psychosocial Re-Evaluation   Current issues with None Identified    Comments He reports feeling great and feels extremely thankful. He reports his wife is a great support system for him as well as his work friends and church community.    Expected Outcomes ST/LT: continue to maintain positive attitude and support system    Interventions Encouraged to attend Cardiac Rehabilitation for the exercise    Continue Psychosocial Services  Follow up required by staff           Vocational Rehabilitation: Provide vocational rehab assistance to qualifying candidates.   Vocational Rehab Evaluation & Intervention:   Education: Education Goals: Education classes will be provided on a variety of topics geared  toward better understanding of heart health and risk factor modification. Participant will state understanding/return demonstration of topics presented as noted by education test scores.  Learning Barriers/Preferences:  Learning Barriers/Preferences - 03/18/20 1328      Learning Barriers/Preferences   Learning Barriers None    Learning Preferences None           General Cardiac Education Topics:  AED/CPR: - Group verbal and written instruction with the use of models to demonstrate the basic use of the AED with the basic ABC's of resuscitation.   Anatomy and Cardiac Procedures: - Group verbal and visual presentation and models provide information about basic cardiac anatomy and function. Reviews the testing methods done to diagnose heart disease and the outcomes of the test results. Describes the treatment  choices: Medical Management, Angioplasty, or Coronary Bypass Surgery for treating various heart conditions including Myocardial Infarction, Angina, Valve Disease, and Cardiac Arrhythmias.  Written material given at graduation. Flowsheet Row Cardiac Rehab from 04/24/2020 in [facility] Cardiac and Pulmonary Rehab  Education need identified 03/19/20      Medication Safety: - Group verbal and visual instruction to review commonly prescribed medications for heart and lung disease. Reviews the medication, class of the drug, and side effects. Includes the steps to properly store meds and maintain the prescription regimen.  Written material given at graduation. Flowsheet Row Cardiac Rehab from 04/24/2020 in [facility] Cardiac and Pulmonary Rehab  Date 03/27/20  Educator SB  Instruction Review Code 1- Verbalizes Understanding      Intimacy: - Group verbal instruction through game format to discuss how heart and lung disease can affect sexual intimacy. Written material given at graduation.. Flowsheet Row Cardiac Rehab from 04/24/2020 in [facility] Cardiac and Pulmonary Rehab  Date 04/24/20  Educator jh  Instruction Review Code 1- Verbalizes Understanding      Know Your Numbers and Heart Failure: - Group verbal and visual instruction to discuss disease risk factors for cardiac and pulmonary disease and treatment options.  Reviews associated critical values for Overweight/Obesity, Hypertension, Cholesterol, and Diabetes.  Discusses basics of heart failure: signs/symptoms and treatments.  Introduces Heart Failure Zone chart for action plan for heart failure.  Written material given at graduation.   Infection Prevention: - Provides verbal and written material to individual with discussion of infection control including proper hand washing and proper equipment cleaning during exercise session. Flowsheet Row Cardiac Rehab from 04/24/2020 in [facility] Cardiac and Pulmonary Rehab  Date 03/18/20  Educator [facility]  Instruction Review  Code 1- Verbalizes Understanding      Falls Prevention: - Provides verbal and written material to individual with discussion of falls prevention and safety. Flowsheet Row Cardiac Rehab from 04/24/2020 in [facility] Cardiac and Pulmonary Rehab  Date 03/18/20  Educator [facility]  Instruction Review Code 1- Verbalizes Understanding      Other: -Provides group and verbal instruction on various topics (see comments)   Knowledge Questionnaire Score:  Knowledge Questionnaire Score - 03/19/20 1022      Knowledge Questionnaire Score   Pre Score 24/26 Education Focus: MI, nutrition           Core Components/Risk Factors/Patient Goals at Admission:  Personal Goals and Risk Factors at Admission - 03/19/20 1029      Core Components/Risk Factors/Patient Goals on Admission    Weight Management Yes;Weight Maintenance    Intervention Weight Management: Develop a combined nutrition and exercise program designed to reach desired caloric intake, while maintaining appropriate intake of nutrient and fiber, sodium and fats, and appropriate  energy expenditure required for the weight goal.;Weight Management: Provide education and appropriate resources to help participant work on and attain dietary goals.;Weight Management/Obesity: Establish reasonable short term and long term weight goals.    Admit Weight 162 lb 6.4 oz (73.7 kg)    Goal Weight: Short Term 164 lb (74.4 kg)    Goal Weight: Long Term 164 lb (74.4 kg)    Expected Outcomes Short Term: Continue to assess and modify interventions until short term weight is achieved;Long Term: Adherence to nutrition and physical activity/exercise program aimed toward attainment of established weight goal;Weight Maintenance: Understanding of the daily nutrition guidelines, which includes 25-35% calories from fat, 7% or less cal from saturated fats, less than $RemoveB'200mg'UetvVDOk$  cholesterol, less than 1.5gm of sodium, & 5 or more servings of fruits and vegetables daily    Diabetes Yes     Intervention Provide education about signs/symptoms and action to take for hypo/hyperglycemia.;Provide education about proper nutrition, including hydration, and aerobic/resistive exercise prescription along with prescribed medications to achieve blood glucose in normal ranges: Fasting glucose 65-99 mg/dL    Expected Outcomes Short Term: Participant verbalizes understanding of the signs/symptoms and immediate care of hyper/hypoglycemia, proper foot care and importance of medication, aerobic/resistive exercise and nutrition plan for blood glucose control.;Long Term: Attainment of HbA1C < 7%.    Hypertension Yes    Intervention Provide education on lifestyle modifcations including regular physical activity/exercise, weight management, moderate sodium restriction and increased consumption of fresh fruit, vegetables, and low fat dairy, alcohol moderation, and smoking cessation.;Monitor prescription use compliance.    Expected Outcomes Short Term: Continued assessment and intervention until BP is < 140/34mm HG in hypertensive participants. < 130/76mm HG in hypertensive participants with diabetes, heart failure or chronic kidney disease.;Long Term: Maintenance of blood pressure at goal levels.    Lipids Yes    Intervention Provide education and support for participant on nutrition & aerobic/resistive exercise along with prescribed medications to achieve LDL '70mg'$ , HDL >$Remo'40mg'TwGgD$ .    Expected Outcomes Short Term: Participant states understanding of desired cholesterol values and is compliant with medications prescribed. Participant is following exercise prescription and nutrition guidelines.;Long Term: Cholesterol controlled with medications as prescribed, with individualized exercise RX and with personalized nutrition plan. Value goals: LDL < $Rem'70mg'PtWP$ , HDL > 40 mg.           Education:Diabetes - Individual verbal and written instruction to review signs/symptoms of diabetes, desired ranges of glucose level fasting,  after meals and with exercise. Acknowledge that pre and post exercise glucose checks will be done for 3 sessions at entry of program. Flowsheet Row Cardiac Rehab from 04/24/2020 in [facility] Cardiac and Pulmonary Rehab  Date 03/18/20  Educator [facility]  Instruction Review Code 1- Verbalizes Understanding      Core Components/Risk Factors/Patient Goals Review:   Goals and Risk Factor Review    Row Name 04/17/20 0934             Core Components/Risk Factors/Patient Goals Review   Personal Goals Review Hypertension       Review He reports taking his BP at home, but not often as it has been doing well - his wife is a Marine scientist. Encouraged to take BP consistently at home especially after rehab. He reports taking his medication as prescribed.       Expected Outcomes ST: consistently take BP at home LT: Continue to manage risk factors              Core Components/Risk Factors/Patient Goals at Discharge (Final  Review):   Goals and Risk Factor Review - 04/17/20 0934      Core Components/Risk Factors/Patient Goals Review   Personal Goals Review Hypertension    Review He reports taking his BP at home, but not often as it has been doing well - his wife is a Marine scientist. Encouraged to take BP consistently at home especially after rehab. He reports taking his medication as prescribed.    Expected Outcomes ST: consistently take BP at home LT: Continue to manage risk factors           ITP Comments:  ITP Comments    Row Name 03/18/20 1341 03/19/20 1017 03/22/20 0926 04/03/20 0906 05/01/20 0631   ITP Comments Virtual Visit completed. Patient informed on EP and RD appointment and 6 Minute walk test. Patient also informed of patient health questionnaires on My Chart. Patient Verbalizes understanding. Visit diagnosis can be found in [facility] 01/26/2020. Completed 6MWT and gym orientation. Initial ITP created and sent for review to Dr. Emily Filbert, Medical Director. First full day of exercise!  Patient was oriented to gym  and equipment including functions, settings, policies, and procedures.  Patient's individual exercise prescription and treatment plan were reviewed.  All starting workloads were established based on the results of the 6 minute walk test done at initial orientation visit.  The plan for exercise progression was also introduced and progression will be customized based on patient's performance and goals. 30 Day review completed. Medical Director ITP review done, changes made as directed, and signed approval by Medical Director.  new to program 30 Day review completed. Medical Director ITP review done, changes made as directed, and signed approval by Medical Director.          Comments:

## 2020-05-03 ENCOUNTER — Encounter: Payer: Medicare Other | Admitting: *Deleted

## 2020-05-03 ENCOUNTER — Other Ambulatory Visit: Payer: Self-pay

## 2020-05-03 DIAGNOSIS — Z951 Presence of aortocoronary bypass graft: Secondary | ICD-10-CM | POA: Diagnosis not present

## 2020-05-03 LAB — GLUCOSE, CAPILLARY: Glucose-Capillary: 207 mg/dL — ABNORMAL HIGH (ref 70–99)

## 2020-05-03 NOTE — Progress Notes (Signed)
Daily Session Note  Patient Details  Name: Luis Wise MRN: [id number] Date of Birth: 1940-08-03 Referring Provider:   Flowsheet Row Cardiac Rehab from 03/19/2020 in [facility] Cardiac and Pulmonary Rehab  Referring Provider [name] MD      Encounter Date: 05/03/2020  Check In:  Session Check In - 05/03/20 1031      Check-In   Supervising physician immediately available to respond to emergencies See telemetry face sheet for immediately available ER MD    Location [facility]-Cardiac & Pulmonary Rehab    Staff Present Heath Lark, RN, BSN, CCRP;Jessica [place], MA, RCEP, CCRP, CCET;Joseph [place] RCP,RRT,BSRT    Virtual Visit No    Medication changes reported     No    Fall or balance concerns reported    No    Warm-up and Cool-down Performed on first and last piece of equipment    Resistance Training Performed Yes    VAD Patient? No    PAD/SET Patient? No      Pain Assessment   Currently in Pain? No/denies              Social History   Tobacco Use  Smoking Status Never Smoker  Smokeless Tobacco Never Used    Goals Met:  Independence with exercise equipment Exercise tolerated well No report of cardiac concerns or symptoms  Goals Unmet:  Not Applicable  Comments: Pt able to follow exercise prescription today without complaint.  Will continue to monitor for progression.    Dr. Emily Filbert is Medical Director for [place] and [facility].

## 2020-05-03 NOTE — Progress Notes (Signed)
Daily Session Note  Patient Details  Name: Luis Wise MRN: [id number] Date of Birth: 1940/12/21 Referring Provider:   Flowsheet Row Cardiac Rehab from 03/19/2020 in [facility] Cardiac and Pulmonary Rehab  Referring Provider [name] MD      Encounter Date: 05/03/2020  Check In:      Social History   Tobacco Use  Smoking Status Never Smoker  Smokeless Tobacco Never Used    Goals Met:  Independence with exercise equipment Exercise tolerated well No report of cardiac concerns or symptoms  Goals Unmet:  Not Applicable  Comments: Pt able to follow exercise prescription today without complaint.  Will continue to monitor for progression.    Dr. Emily Filbert is Medical Director for [place] and [facility].

## 2020-05-06 ENCOUNTER — Encounter: Payer: Medicare Other | Admitting: *Deleted

## 2020-05-06 ENCOUNTER — Other Ambulatory Visit: Payer: Self-pay

## 2020-05-06 DIAGNOSIS — Z951 Presence of aortocoronary bypass graft: Secondary | ICD-10-CM | POA: Diagnosis not present

## 2020-05-06 NOTE — Progress Notes (Signed)
Daily Session Note  Patient Details  Name: Luis Wise MRN: [id number] Date of Birth: March 21, 1940 Referring Provider:   Flowsheet Row Cardiac Rehab from 03/19/2020 in [facility] Cardiac and Pulmonary Rehab  Referring Provider Luis Sacramento MD      Encounter Date: 05/06/2020  Check In:  Session Check In - 05/06/20 0945      Check-In   Supervising physician immediately available to respond to emergencies See telemetry face sheet for immediately available ER MD    Location [facility]-Cardiac & Pulmonary Rehab    Staff Present Luis Lark, RN, BSN, Luis Wise, BS, ACSM CEP, Exercise Physiologist;Luis Wise RCP,RRT,BSRT    Virtual Visit No    Medication changes reported     No    Fall or balance concerns reported    No    Warm-up and Cool-down Performed on first and last piece of equipment    Resistance Training Performed Yes    VAD Patient? No    PAD/SET Patient? No      Pain Assessment   Currently in Pain? No/denies              Social History   Tobacco Use  Smoking Status Never Smoker  Smokeless Tobacco Never Used    Goals Met:  Independence with exercise equipment Exercise tolerated well No report of cardiac concerns or symptoms  Goals Unmet:  Not Applicable  Comments: Pt able to follow exercise prescription today without complaint.  Will continue to monitor for progression.    Dr. Emily Wise is Medical Director for [place] and [facility].

## 2020-05-08 ENCOUNTER — Other Ambulatory Visit: Payer: Self-pay

## 2020-05-08 ENCOUNTER — Encounter: Payer: Medicare Other | Attending: Cardiovascular Disease

## 2020-05-08 DIAGNOSIS — Z5189 Encounter for other specified aftercare: Secondary | ICD-10-CM | POA: Diagnosis not present

## 2020-05-08 DIAGNOSIS — Z951 Presence of aortocoronary bypass graft: Secondary | ICD-10-CM | POA: Diagnosis not present

## 2020-05-08 NOTE — Progress Notes (Signed)
Daily Session Note  Patient Details  Name: Luis Wise MRN: [id number] Date of Birth: 11-May-1940 Referring Provider:   Flowsheet Row Cardiac Rehab from 03/19/2020 in [facility] Cardiac and Pulmonary Rehab  Referring Provider [name] MD      Encounter Date: 05/08/2020  Check In:  Session Check In - 05/08/20 0924      Check-In   Supervising physician immediately available to respond to emergencies See telemetry face sheet for immediately available ER MD    Location [facility]-Cardiac & Pulmonary Rehab    Staff Present Birdie Sons, MPA, RN;Joseph Darrin [place], [place], RCEP, CCRP, CCET    Virtual Visit No    Medication changes reported     No    Fall or balance concerns reported    No    Warm-up and Cool-down Performed on first and last piece of equipment    Resistance Training Performed Yes    VAD Patient? No    PAD/SET Patient? No      Pain Assessment   Currently in Pain? No/denies              Social History   Tobacco Use  Smoking Status Never Smoker  Smokeless Tobacco Never Used    Goals Met:  Independence with exercise equipment Exercise tolerated well No report of cardiac concerns or symptoms Strength training completed today  Goals Unmet:  Not Applicable  Comments: Pt able to follow exercise prescription today without complaint.  Will continue to monitor for progression.    Dr. Emily Filbert is Medical Director for [place] and [facility].

## 2020-05-10 ENCOUNTER — Other Ambulatory Visit: Payer: Self-pay

## 2020-05-10 ENCOUNTER — Encounter: Payer: Self-pay | Admitting: Family Medicine

## 2020-05-10 ENCOUNTER — Ambulatory Visit (INDEPENDENT_AMBULATORY_CARE_PROVIDER_SITE_OTHER): Payer: Medicare Other | Admitting: Family Medicine

## 2020-05-10 ENCOUNTER — Ambulatory Visit: Payer: Medicare Other | Admitting: Family Medicine

## 2020-05-10 VITALS — BP 122/76 | HR 68 | Temp 97.2°F | Ht 70.0 in | Wt 168.0 lb

## 2020-05-10 DIAGNOSIS — E119 Type 2 diabetes mellitus without complications: Secondary | ICD-10-CM | POA: Diagnosis not present

## 2020-05-10 DIAGNOSIS — Z951 Presence of aortocoronary bypass graft: Secondary | ICD-10-CM

## 2020-05-10 DIAGNOSIS — I2510 Atherosclerotic heart disease of native coronary artery without angina pectoris: Secondary | ICD-10-CM

## 2020-05-10 DIAGNOSIS — E118 Type 2 diabetes mellitus with unspecified complications: Secondary | ICD-10-CM

## 2020-05-10 DIAGNOSIS — Z5189 Encounter for other specified aftercare: Secondary | ICD-10-CM | POA: Diagnosis not present

## 2020-05-10 LAB — BASIC METABOLIC PANEL
BUN: 29 mg/dL — ABNORMAL HIGH (ref 6–23)
CO2: 28 mEq/L (ref 19–32)
Calcium: 10.3 mg/dL (ref 8.4–10.5)
Chloride: 97 mEq/L (ref 96–112)
Creatinine, Ser: 1.29 mg/dL (ref 0.40–1.50)
GFR: 53.59 mL/min — ABNORMAL LOW (ref >60.00)
Glucose, Bld: 216 mg/dL — ABNORMAL HIGH (ref 70–99)
Potassium: 3.7 mEq/L (ref 3.5–5.1)
Sodium: 135 mEq/L (ref 135–145)

## 2020-05-10 MED ORDER — METFORMIN HCL 1000 MG PO TABS: ORAL_TABLET | ORAL | Status: DC

## 2020-05-10 NOTE — Patient Instructions (Signed)
Don't change your meds yet.  Go to the lab on the way out.   If you have mychart we'll likely use that to update you.    If your labs are fine, but still with AM sugars above 150, then add on extra 2mg  glimepiride at night just before dinner.   Take care.  Glad to see you.

## 2020-05-10 NOTE — Progress Notes (Signed)
This visit occurred during the SARS-CoV-2 public health emergency.  Safety protocols were in place, including screening questions prior to the visit, additional usage of staff PPE, and extensive cleaning of exam room while observing appropriate contact time as indicated for disinfecting solutions.  CAD.  He is doing well in cardiac rehab.  No CP, SOB, BLE edema.    DM2.  Previous endocrinology notes discussed with patient. "We plan to check another BMP at his next visit with PCP in less than a month.  For now, to improve his morning sugars, I advised him to move the entire Metformin dose at night, 1500 mg.  If this is not enough, I advised him to add 2 mg of glimepiride before dinner."  Sugar usually 170-190 in the AM, usually ~115 later in the day (prior to supper).  No ADE on med.  No lows.    He can tolerate urinary frequency on farxiga, d/w pt.    Meds, vitals, and allergies reviewed.   ROS: Per HPI unless specifically indicated in ROS section   GEN: nad, alert and oriented HEENT: NCAT NECK: supple w/o LA CV: rrr.  Midline sternotomy scar healed. PULM: ctab, no inc wob ABD: soft, +bs EXT: no edema SKIN: no acute rash

## 2020-05-10 NOTE — Progress Notes (Signed)
Daily Session Note  Patient Details  Name: Luis Wise MRN: [id number] Date of Birth: Oct 31, 1940 Referring Provider:   Flowsheet Row Cardiac Rehab from 03/19/2020 in [facility] [place] Cardiac and Pulmonary Rehab  Referring Provider [name] MD      Encounter Date: 05/10/2020  Check In:  Session Check In - 05/10/20 0928      Check-In   Supervising physician immediately available to respond to emergencies See telemetry face sheet for immediately available ER MD    Location [facility]-Cardiac & Pulmonary Rehab    Staff Present Birdie Sons, MPA, RN;Joseph Darrin Nipper, [place], RCEP, CCRP, CCET    Virtual Visit No    Medication changes reported     No    Fall or balance concerns reported    No    Warm-up and Cool-down Performed on first and last piece of equipment    Resistance Training Performed Yes    VAD Patient? No    PAD/SET Patient? No      Pain Assessment   Currently in Pain? No/denies              Social History   Tobacco Use  Smoking Status Never Smoker  Smokeless Tobacco Never Used    Goals Met:  Independence with exercise equipment Exercise tolerated well No report of cardiac concerns or symptoms Strength training completed today  Goals Unmet:  Not Applicable  Comments: Pt able to follow exercise prescription today without complaint.  Will continue to monitor for progression.    Dr. Emily Filbert is Medical Director for [place] and [facility].

## 2020-05-11 NOTE — Assessment & Plan Note (Signed)
See notes on labs for Advised patient that if his labs are fine, but still with AM sugars above 150, then add on extra 2mg  glimepiride at night just before dinner.   We will route endocrinology as FYI.  I appreciate the help of all involved.

## 2020-05-13 ENCOUNTER — Encounter: Payer: Medicare Other | Admitting: *Deleted

## 2020-05-13 ENCOUNTER — Other Ambulatory Visit: Payer: Self-pay

## 2020-05-13 DIAGNOSIS — Z951 Presence of aortocoronary bypass graft: Secondary | ICD-10-CM | POA: Diagnosis not present

## 2020-05-13 DIAGNOSIS — Z5189 Encounter for other specified aftercare: Secondary | ICD-10-CM | POA: Diagnosis not present

## 2020-05-13 NOTE — Progress Notes (Signed)
Daily Session Note  Patient Details  Name: Luis Wise MRN: [id number] Date of Birth: March 30, 1940 Referring Provider:   Flowsheet Row Cardiac Rehab from 03/19/2020 in [facility] Cardiac and Pulmonary Rehab  Referring Provider [name] MD      Encounter Date: 05/13/2020  Check In:  Session Check In - 05/13/20 0937      Check-In   Supervising physician immediately available to respond to emergencies See telemetry face sheet for immediately available ER MD    Location [facility]-Cardiac & Pulmonary Rehab    Staff Present Heath Lark, RN, BSN, CCRP;Joseph [place] RCP,RRT,BSRT;Kelly [place], [place], ACSM CEP, Exercise Physiologist    Virtual Visit No    Medication changes reported     No    Fall or balance concerns reported    No    Warm-up and Cool-down Performed on first and last piece of equipment    Resistance Training Performed Yes    VAD Patient? No    PAD/SET Patient? No      Pain Assessment   Currently in Pain? No/denies              Social History   Tobacco Use  Smoking Status Never Smoker  Smokeless Tobacco Never Used    Goals Met:  Independence with exercise equipment Exercise tolerated well No report of cardiac concerns or symptoms  Goals Unmet:  Not Applicable  Comments: Pt able to follow exercise prescription today without complaint.  Will continue to monitor for progression.    Dr. Emily Filbert is Medical Director for [place] and [facility].

## 2020-05-15 ENCOUNTER — Other Ambulatory Visit: Payer: Self-pay

## 2020-05-15 DIAGNOSIS — Z5189 Encounter for other specified aftercare: Secondary | ICD-10-CM | POA: Diagnosis not present

## 2020-05-15 DIAGNOSIS — Z951 Presence of aortocoronary bypass graft: Secondary | ICD-10-CM

## 2020-05-15 NOTE — Progress Notes (Signed)
Daily Session Note  Patient Details  Name: Luis Wise MRN: [id number] Date of Birth: Sep 21, 1940 Referring Provider:   Flowsheet Row Cardiac Rehab from 03/19/2020 in [facility] Cardiac and Pulmonary Rehab  Referring Provider [name] MD      Encounter Date: 05/15/2020  Check In:  Session Check In - 05/15/20 0915      Check-In   Supervising physician immediately available to respond to emergencies See telemetry face sheet for immediately available ER MD    Location [facility]-Cardiac & Pulmonary Rehab    Staff Present Birdie Sons, MPA, RN;Amanda Oletta Darter, BA, ACSM CEP, Exercise Physiologist;Jessica Luan Pulling, MA, RCEP, CCRP, CCET    Virtual Visit No    Medication changes reported     No    Fall or balance concerns reported    No    Warm-up and Cool-down Performed on first and last piece of equipment    Resistance Training Performed Yes    VAD Patient? No    PAD/SET Patient? No      Pain Assessment   Currently in Pain? No/denies              Social History   Tobacco Use  Smoking Status Never Smoker  Smokeless Tobacco Never Used    Goals Met:  Independence with exercise equipment Exercise tolerated well No report of cardiac concerns or symptoms Strength training completed today  Goals Unmet:  Not Applicable  Comments: Pt able to follow exercise prescription today without complaint.  Will continue to monitor for progression.    Dr. Emily Filbert is Medical Director for [place] and [facility].

## 2020-05-17 ENCOUNTER — Other Ambulatory Visit: Payer: Self-pay

## 2020-05-17 ENCOUNTER — Encounter: Payer: Medicare Other | Admitting: *Deleted

## 2020-05-17 DIAGNOSIS — Z951 Presence of aortocoronary bypass graft: Secondary | ICD-10-CM

## 2020-05-17 DIAGNOSIS — Z5189 Encounter for other specified aftercare: Secondary | ICD-10-CM | POA: Diagnosis not present

## 2020-05-17 NOTE — Progress Notes (Signed)
Daily Session Note  Patient Details  Name: Luis Wise MRN: [id number] Date of Birth: 11/13/1940 Referring Provider:   Flowsheet Row Cardiac Rehab from 03/19/2020 in [facility] Cardiac and Pulmonary Rehab  Referring Provider [name] MD      Encounter Date: 05/17/2020  Check In:  Session Check In - 05/17/20 0938      Check-In   Supervising physician immediately available to respond to emergencies See telemetry face sheet for immediately available ER MD    Location [facility]-Cardiac & Pulmonary Rehab    Staff Present Heath Lark, RN, BSN, CCRP;Jessica [place], MA, RCEP, CCRP, CCET;Joseph [place] RCP,RRT,BSRT    Virtual Visit No    Medication changes reported     No    Fall or balance concerns reported    No    Warm-up and Cool-down Performed on first and last piece of equipment    Resistance Training Performed Yes    VAD Patient? No    PAD/SET Patient? No      Pain Assessment   Currently in Pain? No/denies              Social History   Tobacco Use  Smoking Status Never Smoker  Smokeless Tobacco Never Used    Goals Met:  Independence with exercise equipment Exercise tolerated well No report of cardiac concerns or symptoms  Goals Unmet:  Not Applicable  Comments: Pt able to follow exercise prescription today without complaint.  Will continue to monitor for progression.    Dr. Emily Filbert is Medical Director for [place] and [facility].

## 2020-05-20 ENCOUNTER — Encounter: Payer: Medicare Other | Admitting: *Deleted

## 2020-05-20 ENCOUNTER — Other Ambulatory Visit: Payer: Self-pay

## 2020-05-20 DIAGNOSIS — Z951 Presence of aortocoronary bypass graft: Secondary | ICD-10-CM

## 2020-05-20 DIAGNOSIS — Z5189 Encounter for other specified aftercare: Secondary | ICD-10-CM | POA: Diagnosis not present

## 2020-05-20 NOTE — Progress Notes (Signed)
Daily Session Note  Patient Details  Name: Luis Wise MRN: [id number] Date of Birth: [birth year] Referring Provider:   Flowsheet Row Cardiac Rehab from 03/19/2020 in [facility] Cardiac and Pulmonary Rehab  Referring Provider [name] MD      Encounter Date: 05/20/2020  Check In:  Session Check In - 05/20/20 0942      Check-In   Supervising physician immediately available to respond to emergencies See telemetry face sheet for immediately available ER MD    Location [facility]-Cardiac & Pulmonary Rehab    Staff Present Heath Lark, RN, BSN, CCRP;Joseph Hood RCP,RRT,BSRT;Kelly [place], [place], ACSM CEP, Exercise Physiologist    Virtual Visit No    Medication changes reported     No    Fall or balance concerns reported    No    Warm-up and Cool-down Performed on first and last piece of equipment    Resistance Training Performed Yes    VAD Patient? No    PAD/SET Patient? No      Pain Assessment   Currently in Pain? No/denies              Social History   Tobacco Use  Smoking Status Never Smoker  Smokeless Tobacco Never Used    Goals Met:  Independence with exercise equipment Exercise tolerated well No report of cardiac concerns or symptoms  Goals Unmet:  Not Applicable  Comments: Pt able to follow exercise prescription today without complaint.  Will continue to monitor for progression.    Dr. Emily Filbert is Medical Director for [place] and [facility].

## 2020-05-22 ENCOUNTER — Other Ambulatory Visit: Payer: Self-pay

## 2020-05-22 DIAGNOSIS — Z951 Presence of aortocoronary bypass graft: Secondary | ICD-10-CM | POA: Diagnosis not present

## 2020-05-22 DIAGNOSIS — Z5189 Encounter for other specified aftercare: Secondary | ICD-10-CM | POA: Diagnosis not present

## 2020-05-22 NOTE — Progress Notes (Signed)
Daily Session Note  Patient Details  Name: Luis Wise MRN: [id number] Date of Birth: 10-May-1940 Referring Provider:   Flowsheet Row Cardiac Rehab from 03/19/2020 in [facility] Cardiac and Pulmonary Rehab  Referring Provider [name] MD      Encounter Date: 05/22/2020  Check In:  Session Check In - 05/22/20 0935      Check-In   Supervising physician immediately available to respond to emergencies See telemetry face sheet for immediately available ER MD    Location [facility]-Cardiac & Pulmonary Rehab    Staff Present Birdie Sons, MPA, [place], BA, ACSM CEP, Exercise Physiologist;Joseph Tessie Fass RCP,RRT,BSRT    Virtual Visit No    Medication changes reported     No    Fall or balance concerns reported    No    Warm-up and Cool-down Performed on first and last piece of equipment    Resistance Training Performed Yes    VAD Patient? No    PAD/SET Patient? No      Pain Assessment   Currently in Pain? No/denies              Social History   Tobacco Use  Smoking Status Never Smoker  Smokeless Tobacco Never Used    Goals Met:  Independence with exercise equipment Exercise tolerated well No report of cardiac concerns or symptoms Strength training completed today  Goals Unmet:  Not Applicable  Comments: Pt able to follow exercise prescription today without complaint.  Will continue to monitor for progression.    Dr. Emily Filbert is Medical Director for [place] and [facility].

## 2020-05-24 ENCOUNTER — Encounter: Payer: Medicare Other | Admitting: *Deleted

## 2020-05-24 ENCOUNTER — Other Ambulatory Visit: Payer: Self-pay

## 2020-05-24 DIAGNOSIS — Z5189 Encounter for other specified aftercare: Secondary | ICD-10-CM | POA: Diagnosis not present

## 2020-05-24 DIAGNOSIS — Z951 Presence of aortocoronary bypass graft: Secondary | ICD-10-CM

## 2020-05-24 NOTE — Progress Notes (Signed)
Daily Session Note  Patient Details  Name: Luis Wise MRN: [id number] Date of Birth: [birth year] Referring Provider:   Flowsheet Row Cardiac Rehab from 03/19/2020 in [facility] Cardiac and Pulmonary Rehab  Referring Provider [name] MD      Encounter Date: 05/24/2020  Check In:  Session Check In - 05/24/20 0934      Check-In   Supervising physician immediately available to respond to emergencies See telemetry face sheet for immediately available ER MD    Location [facility]-Cardiac & Pulmonary Rehab    Staff Present Heath Lark, RN, BSN, CCRP;Joseph Darrin Nipper, [place], [place], CCRP, CCET;Meredith [place], RN BSN    Virtual Visit No    Medication changes reported     No    Fall or balance concerns reported    No    Warm-up and Cool-down Performed on first and last piece of equipment    Resistance Training Performed Yes    VAD Patient? No    PAD/SET Patient? No      Pain Assessment   Currently in Pain? No/denies              Social History   Tobacco Use  Smoking Status Never Smoker  Smokeless Tobacco Never Used    Goals Met:  Independence with exercise equipment Exercise tolerated well No report of cardiac concerns or symptoms  Goals Unmet:  Not Applicable  Comments: Pt able to follow exercise prescription today without complaint.  Will continue to monitor for progression.    Dr. Emily Filbert is Medical Director for [place] and [facility].

## 2020-05-27 ENCOUNTER — Other Ambulatory Visit: Payer: Self-pay

## 2020-05-27 ENCOUNTER — Encounter: Payer: Medicare Other | Admitting: *Deleted

## 2020-05-27 DIAGNOSIS — Z951 Presence of aortocoronary bypass graft: Secondary | ICD-10-CM

## 2020-05-27 DIAGNOSIS — Z5189 Encounter for other specified aftercare: Secondary | ICD-10-CM | POA: Diagnosis not present

## 2020-05-27 NOTE — Progress Notes (Signed)
Daily Session Note  Patient Details  Name: GEROD CALIGIURI MRN: [id number] Date of Birth: 1941/01/17 Referring Provider:   Flowsheet Row Cardiac Rehab from 03/19/2020 in [facility] Cardiac and Pulmonary Rehab  Referring Provider [name] MD      Encounter Date: 05/27/2020  Check In:  Session Check In - 05/27/20 1016      Check-In   Supervising physician immediately available to respond to emergencies See telemetry face sheet for immediately available ER MD    Location [facility]-Cardiac & Pulmonary Rehab    Staff Present Heath Lark, RN, BSN, CCRP;Joseph Hood RCP,RRT,BSRT;Kelly [place], [place], ACSM CEP, Exercise Physiologist    Virtual Visit No    Medication changes reported     No    Fall or balance concerns reported    No    Warm-up and Cool-down Performed on first and last piece of equipment    Resistance Training Performed Yes    VAD Patient? No    PAD/SET Patient? No      Pain Assessment   Currently in Pain? No/denies              Social History   Tobacco Use  Smoking Status Never Smoker  Smokeless Tobacco Never Used    Goals Met:  Independence with exercise equipment Exercise tolerated well No report of cardiac concerns or symptoms  Goals Unmet:  Not Applicable  Comments: Pt able to follow exercise prescription today without complaint.  Will continue to monitor for progression.    Dr. Emily Filbert is Medical Director for [place] and [facility].

## 2020-05-29 ENCOUNTER — Encounter: Payer: Self-pay | Admitting: *Deleted

## 2020-05-29 DIAGNOSIS — Z951 Presence of aortocoronary bypass graft: Secondary | ICD-10-CM

## 2020-05-29 NOTE — Progress Notes (Signed)
Cardiac Individual Treatment Plan  Patient Details  Name: JAMARQUIS CRULL MRN: [id number] Date of Birth: 02-Jan-1941 Referring Provider:   Flowsheet Row Cardiac Rehab from 03/19/2020 in [facility] Cardiac and Pulmonary Rehab  Referring Provider [name] MD      Initial Encounter Date:  Flowsheet Row Cardiac Rehab from 03/19/2020 in [facility] Cardiac and Pulmonary Rehab  Date 03/19/20      Visit Diagnosis: S/P CABG x 5  Patient's Home Medications on Admission:  Current Outpatient Medications:  .  amLODipine (NORVASC) 10 MG tablet, Take 1 tablet (10 mg total) by mouth daily., Disp: 90 tablet, Rfl: 1 .  aspirin EC 81 MG tablet, Take 1 tablet (81 mg total) by mouth daily. Swallow whole., Disp: 150 tablet, Rfl: 2 .  atorvastatin (LIPITOR) 80 MG tablet, Take 1 tablet (80 mg total) by mouth daily., Disp: 90 tablet, Rfl: 3 .  carvedilol (COREG) 12.5 MG tablet, Take 1 tablet (12.5 mg total) by mouth 2 (two) times daily., Disp: 180 tablet, Rfl: 3 .  cholecalciferol (VITAMIN D3) 25 MCG (1000 UNIT) tablet, Take 1,000 Units by mouth daily., Disp: , Rfl:  .  Coenzyme Q10 (COQ10) 100 MG CAPS, Take 100 mg by mouth daily. , Disp: , Rfl:  .  dapagliflozin propanediol (FARXIGA) 5 MG TABS tablet, Take 1 tablet (5 mg total) by mouth daily before breakfast., Disp: 30 tablet, Rfl: 11 .  fenofibrate 160 MG tablet, Take 1 tablet (160 mg total) by mouth daily., Disp: 90 tablet, Rfl: 1 .  ferrous sulfate 324 (65 Fe) MG TBEC, Take 1 tablet (325 mg total) by mouth every Monday, Wednesday, and Friday., Disp: , Rfl:  .  glimepiride (AMARYL) 4 MG tablet, TAKE 1 TABLET BY MOUTH EVERY DAY WITH BREAKFAST, Disp: 90 tablet, Rfl: 1 .  glucose blood (ONETOUCH ULTRA) test strip, TEST 1-2x a day, Disp: 200 strip, Rfl: 3 .  lisinopril (ZESTRIL) 20 MG tablet, TAKE 1 TABLET BY MOUTH EVERY DAY, Disp: 90 tablet, Rfl: 1 .  lisinopril-hydrochlorothiazide (ZESTORETIC) 20-25 MG tablet, Take 1 tablet by mouth daily., Disp: 90 tablet, Rfl:  1 .  metFORMIN (GLUCOPHAGE) 1000 MG tablet, $RemoveBe'1500mg'CIPGYoGln$  in the PM, Disp: , Rfl:  .  Misc Natural Products (URINOZINC PO), Take 1 tablet by mouth in the morning and at bedtime. , Disp: , Rfl:  .  Multiple Vitamins-Minerals (PRESERVISION AREDS 2 PO), Take 1 tablet by mouth 2 (two) times daily. , Disp: , Rfl:  .  sildenafil (REVATIO) 20 MG tablet, Take 20 mg by mouth as needed., Disp: , Rfl:  .  traZODone (DESYREL) 50 MG tablet, TAKE 1 TO 1.5 TABLETS BY MOUTH EVERY NIGHT FOR SLEEP, Disp: 135 tablet, Rfl: 3 .  ZINC OXIDE PO, Take by mouth., Disp: , Rfl:   Past Medical History: Past Medical History:  Diagnosis Date  . Allergy    Amoxicillin  . Cataract 2017   Mild progression  . Coronary artery disease    a. s/p 5-vessel CABG (LIMA-LAD, SVG-diag, sequential SVG-OM1-OM 2, & SVG-PDA)  . Diabetes mellitus with complication ([place])   . Hyperlipidemia   . Hypertension   . Insomnia   . neuropathy    bilateral feet  . Postoperative atrial fibrillation (HCC)     Tobacco Use: Social History   Tobacco Use  Smoking Status Never Smoker  Smokeless Tobacco Never Used    Labs: Recent Review Scientist, physiological    Labs for ITP Cardiac and Pulmonary Rehab Latest Ref Rng & Units 01/29/2020 01/29/2020 01/31/2020  03/11/2020 04/11/2020   Cholestrol 0 - 200 mg/dL - - - - 122   LDLCALC 0 - 99 mg/dL - - - - 66   LDLDIRECT mg/dL - - - - -   HDL >40 mg/dL - - - - 31(L)   Trlycerides <150 mg/dL - - - - 127   Hemoglobin A1c 4.0 - 5.6 % - - - 6.9(A) -   PHART 7.350 - 7.450 7.541(H) 7.386 - - -   PCO2ART 32.0 - 48.0 mmHg 22.8(L) 38.0 - - -   HCO3 20.0 - 28.0 mmol/L 19.6(L) 22.7 - - -   TCO2 22 - 32 mmol/L 20(L) 24 - - -   ACIDBASEDEF 0.0 - 2.0 mmol/L 2.0 2.0 - - -   O2SAT % 98.0 90.0 73.7 - -       Exercise Target Goals: Exercise Program Goal: Individual exercise prescription set using results from initial 6 min walk test and THRR while considering  patient's activity barriers and safety.   Exercise  Prescription Goal: Initial exercise prescription builds to 30-45 minutes a day of aerobic activity, 2-3 days per week.  Home exercise guidelines will be given to patient during program as part of exercise prescription that the participant will acknowledge.   Education: Aerobic Exercise: - Group verbal and visual presentation on the components of exercise prescription. Introduces F.I.T.T principle from ACSM for exercise prescriptions.  Reviews F.I.T.T. principles of aerobic exercise including progression. Written material given at graduation. Flowsheet Row Cardiac Rehab from 04/24/2020 in [facility] Cardiac and Pulmonary Rehab  Date 04/24/20  Educator jh  Instruction Review Code 1- Verbalizes Understanding      Education: Resistance Exercise: - Group verbal and visual presentation on the components of exercise prescription. Introduces F.I.T.T principle from ACSM for exercise prescriptions  Reviews F.I.T.T. principles of resistance exercise including progression. Written material given at graduation.    Education: Exercise & Equipment Safety: - Individual verbal instruction and demonstration of equipment use and safety with use of the equipment. Flowsheet Row Cardiac Rehab from 04/24/2020 in [facility] Cardiac and Pulmonary Rehab  Date 03/18/20  Educator [facility]  Instruction Review Code 1- Verbalizes Understanding      Education: Exercise Physiology & General Exercise Guidelines: - Group verbal and written instruction with models to review the exercise physiology of the cardiovascular system and associated critical values. Provides general exercise guidelines with specific guidelines to those with heart or lung disease.  Flowsheet Row Cardiac Rehab from 04/24/2020 in [facility] Cardiac and Pulmonary Rehab  Date 04/17/20  Educator [facility]  Instruction Review Code 1- Verbalizes Understanding      Education: Flexibility, Balance, Mind/Body Relaxation: - Group verbal and visual presentation with interactive activity  on the components of exercise prescription. Introduces F.I.T.T principle from ACSM for exercise prescriptions. Reviews F.I.T.T. principles of flexibility and balance exercise training including progression. Also discusses the mind body connection.  Reviews various relaxation techniques to help reduce and manage stress (i.e. Deep breathing, progressive muscle relaxation, and visualization). Balance handout provided to take home. Written material given at graduation.   Activity Barriers & Risk Stratification:  Activity Barriers & Cardiac Risk Stratification - 03/19/20 1018      Activity Barriers & Cardiac Risk Stratification   Activity Barriers None    Cardiac Risk Stratification High           6 Minute Walk:  6 Minute Walk    Row Name 03/19/20 1018 05/22/20 1113       6 Minute Walk   Phase  Initial Discharge    Distance 1540 feet 1895 feet    Distance % Change -- 23 %    Walk Time 6 minutes 6 minutes    # of Rest Breaks 0 0    MPH 2.92 3.6    METS 3.17 3.9    RPE 11 11    VO2 Peak 11.1 13.64    Symptoms No No    Resting HR 75 bpm 76 bpm    Resting BP 124/74 130/60    Resting Oxygen Saturation  99 % --    Exercise Oxygen Saturation  during 6 min walk 99 % --    Max Ex. HR 86 bpm 92 bpm    Max Ex. BP 136/60 150/60    2 Minute Post BP 126/58 --           Oxygen Initial Assessment:   Oxygen Re-Evaluation:   Oxygen Discharge (Final Oxygen Re-Evaluation):   Initial Exercise Prescription:  Initial Exercise Prescription - 03/19/20 1000      Date of Initial Exercise RX and Referring Provider   Date 03/19/20    Referring Provider [name] MD      Treadmill   MPH 2.7    Grade 1    Minutes 15    METs 3.44      Recumbant Bike   Level 2    RPM 50    Watts 27    Minutes 15    METs 3.1      NuStep   Level 3    SPM 80    Minutes 15    METs 3.1      Recumbant Elliptical   Level 2    RPM 50    Minutes 15    METs 3      Elliptical   Level 1     Speed 2.9    Minutes 15      Prescription Details   Frequency (times per week) 3    Duration Progress to 30 minutes of continuous aerobic without signs/symptoms of physical distress      Intensity   THRR 40-80% of Max Heartrate 102-129    Ratings of Perceived Exertion 11-13    Perceived Dyspnea 0-4      Progression   Progression Continue to progress workloads to maintain intensity without signs/symptoms of physical distress.      Resistance Training   Weight 4 lb    Reps 10-15           Perform Capillary Blood Glucose checks as needed.  Exercise Prescription Changes:  Exercise Prescription Changes    Row Name 03/19/20 1000 03/27/20 0800 04/08/20 1500 04/22/20 1100 05/06/20 1500     Response to Exercise   Blood Pressure (Admit) 124/74 138/66 166/74 110/56 110/60   Blood Pressure (Exercise) 136/60 138/56 128/62 120/64 122/60   Blood Pressure (Exit) 126/58 126/64 120/60 116/74 132/66   Heart Rate (Admit) 75 bpm 75 bpm 70 bpm 70 bpm 69 bpm   Heart Rate (Exercise) 86 bpm 95 bpm 95 bpm 91 bpm 102 bpm   Heart Rate (Exit) 75 bpm 82 bpm 77 bpm 73 bpm 74 bpm   Oxygen Saturation (Admit) 99 % -- -- -- --   Oxygen Saturation (Exercise) 99 % -- -- -- --   Rating of Perceived Exertion (Exercise) _0 Symptoms _1    Comments walk test results first day -- -- --   Duration -- Progress  to 30 minutes of  aerobic without signs/symptoms of physical distress Continue with 30 min of aerobic exercise without signs/symptoms of physical distress. Continue with 30 min of aerobic exercise without signs/symptoms of physical distress. Continue with 30 min of aerobic exercise without signs/symptoms of physical distress.   Intensity -- THRR unchanged THRR unchanged THRR unchanged THRR unchanged     Progression   Progression -- Continue to progress workloads to maintain intensity without signs/symptoms of physical distress. Continue to progress workloads to maintain  intensity without signs/symptoms of physical distress. Continue to progress workloads to maintain intensity without signs/symptoms of physical distress. Continue to progress workloads to maintain intensity without signs/symptoms of physical distress.   Average METs -- 2.9 3.55 3.26 3.88     Resistance Training   Weight -- 4 lb 5 lb 5 lb 7 lb   Reps -- 10-15 10-15 10-15 10-15     Interval Training   Interval Training -- No No No No     Treadmill   MPH -- 3 3 2.8 3   Grade -- $Remove'1 1 2 2   'qIGYdJa$ Minutes -- $RemoveBe'15 15 15 15   'wzXGQUYML$ METs -- 3.71 3.71 3.92 4.12     Recumbant Bike   Level -- -- 3.5 -- 3.5   Minutes -- -- 15 -- 15   METs -- -- 4 -- 4.2     NuStep   Level -- -- 5 -- 5   Minutes -- -- 15 -- 15   METs -- -- 3.2 -- 4     Recumbant Elliptical   Level -- $Remove'2 4 3 'JOjfYXB$ 3.6   RPM -- 50 -- -- --   Minutes -- $RemoveBe'15 15 15 15   'eFcZNQVcF$ METs -- 2.1 3.3 2.4 3.2     Home Exercise Plan   Plans to continue exercise at -- -- Home (comment)  walking Home (comment)  walking Home (comment)  walking   Frequency -- -- Add 3 additional days to program exercise sessions. Add 3 additional days to program exercise sessions. Add 3 additional days to program exercise sessions.   Initial Home Exercises Provided -- -- 04/03/20 04/03/20 04/03/20   Row Name 05/20/20 1100             Response to Exercise   Blood Pressure (Admit) 116/64       Blood Pressure (Exercise) 128/74       Blood Pressure (Exit) 116/62       Heart Rate (Admit) 74 bpm       Heart Rate (Exercise) 92 bpm       Heart Rate (Exit) 83 bpm       Rating of Perceived Exertion (Exercise) 13       Symptoms none       Duration Continue with 30 min of aerobic exercise without signs/symptoms of physical distress.       Intensity THRR unchanged               Progression   Progression Continue to progress workloads to maintain intensity without signs/symptoms of physical distress.       Average METs 4.3               Resistance Training   Weight 7 lb       Reps  10-15               Interval Training   Interval Training No  Treadmill   MPH 3       Grade 2       Minutes 15       METs 4.12               Recumbant Elliptical   Level 2.5               Home Exercise Plan   Plans to continue exercise at Home (comment)  walking       Frequency Add 3 additional days to program exercise sessions.       Initial Home Exercises Provided 04/03/20              Exercise Comments:  Exercise Comments    Row Name 03/22/20 6549           Exercise Comments First full day of exercise!  Patient was oriented to gym and equipment including functions, settings, policies, and procedures.  Patient's individual exercise prescription and treatment plan were reviewed.  All starting workloads were established based on the results of the 6 minute walk test done at initial orientation visit.  The plan for exercise progression was also introduced and progression will be customized based on patient's performance and goals.              Exercise Goals and Review:  Exercise Goals    Row Name 03/19/20 1021             Exercise Goals   Increase Physical Activity Yes       Intervention Provide advice, education, support and counseling about physical activity/exercise needs.;Develop an individualized exercise prescription for aerobic and resistive training based on initial evaluation findings, risk stratification, comorbidities and participant's personal goals.       Expected Outcomes Short Term: Attend rehab on a regular basis to increase amount of physical activity.;Long Term: Add in home exercise to make exercise part of routine and to increase amount of physical activity.;Long Term: Exercising regularly at least 3-5 days a week.       Increase Strength and Stamina Yes       Intervention Provide advice, education, support and counseling about physical activity/exercise needs.;Develop an individualized exercise prescription for aerobic and  resistive training based on initial evaluation findings, risk stratification, comorbidities and participant's personal goals.       Expected Outcomes Short Term: Perform resistance training exercises routinely during rehab and add in resistance training at home;Short Term: Increase workloads from initial exercise prescription for resistance, speed, and METs.;Long Term: Improve cardiorespiratory fitness, muscular endurance and strength as measured by increased METs and functional capacity ( )       Able to understand and use rate of perceived exertion (RPE) scale Yes       Intervention Provide education and explanation on how to use RPE scale       Expected Outcomes Short Term: Able to use RPE daily in rehab to express subjective intensity level;Long Term:  Able to use RPE to guide intensity level when exercising independently       Able to understand and use Dyspnea scale Yes       Intervention Provide education and explanation on how to use Dyspnea scale       Expected Outcomes Short Term: Able to use Dyspnea scale daily in rehab to express subjective sense of shortness of breath during exertion;Long Term: Able to use Dyspnea scale to guide intensity level when exercising independently       Knowledge and understanding of Target  Heart Rate Range (THRR) Yes       Intervention Provide education and explanation of THRR including how the numbers were predicted and where they are located for reference       Expected Outcomes Short Term: Able to state/look up THRR;Short Term: Able to use daily as guideline for intensity in rehab;Long Term: Able to use THRR to govern intensity when exercising independently       Able to check pulse independently Yes       Intervention Provide education and demonstration on how to check pulse in carotid and radial arteries.;Review the importance of being able to check your own pulse for safety during independent exercise       Expected Outcomes Short Term: Able to explain  why pulse checking is important during independent exercise;Long Term: Able to check pulse independently and accurately       Understanding of Exercise Prescription Yes       Intervention Provide education, explanation, and written materials on patient's individual exercise prescription       Expected Outcomes Short Term: Able to explain program exercise prescription;Long Term: Able to explain home exercise prescription to exercise independently              Exercise Goals Re-Evaluation :  Exercise Goals Re-Evaluation    Row Name 03/22/20 0926 04/08/20 1459 04/17/20 0928 04/22/20 1120 05/06/20 0950     Exercise Goal Re-Evaluation   Exercise Goals Review Increase Physical Activity;Able to understand and use rate of perceived exertion (RPE) scale;Knowledge and understanding of Target Heart Rate Range (THRR);Understanding of Exercise Prescription;Increase Strength and Stamina;Able to understand and use Dyspnea scale;Able to check pulse independently Increase Physical Activity;Increase Strength and Stamina;Understanding of Exercise Prescription Increase Physical Activity;Increase Strength and Stamina;Understanding of Exercise Prescription Increase Physical Activity;Increase Strength and Stamina Increase Physical Activity   Comments Reviewed RPE and dyspnea scales, THR and program prescription with pt today.  Pt voiced understanding and was given a copy of goals to take home. Isak is off to a good start in rehab.  He is already up to level 4 on the recumbent elliptical and using 5 lb weights.  We will continue to monitor his progress. He reports having an eliipital at home that he hasn't used since starting rehab. When the weather is good he will walk 3 miles in a park [place] 3x/week - he also hasn't done that since starting rehab due to the weather. Shayne attends consistently.  He doesnt reach THR range (likely due to beta blocker) but works at RPE 12-16.  We will continue to monitor progress. Noble isnt  exercising outside HT classes. He plans to go back to walking [employer] trail when he finishes HT.   Expected Outcomes Short: Use RPE daily to regulate intensity. Long: Follow program prescription in THR. Short: Add resistance to recumbent bike Long: Continue to improve stamina Short: Exercise 2x at home when not at rehab on elliptical Long: Continue to improve stamina Short:  continue to exercise consistently Long:  improve overall stamina Short: complete HT program Long:  maintain fitness overall   Row Name 05/20/20 1121             Exercise Goal Re-Evaluation   Exercise Goals Review Increase Physical Activity;Increase Strength and Stamina       Comments Heitor is progressing well and has increased MET level and is up to 7 lb for strength training.       Expected Outcomes Short: continue to increase workloads Long:  improve post 6 MWT              Discharge Exercise Prescription (Final Exercise Prescription Changes):  Exercise Prescription Changes - 05/20/20 1100      Response to Exercise   Blood Pressure (Admit) 116/64    Blood Pressure (Exercise) 128/74    Blood Pressure (Exit) 116/62    Heart Rate (Admit) 74 bpm    Heart Rate (Exercise) 92 bpm    Heart Rate (Exit) 83 bpm    Rating of Perceived Exertion (Exercise) 13    Symptoms none    Duration Continue with 30 min of aerobic exercise without signs/symptoms of physical distress.    Intensity THRR unchanged      Progression   Progression Continue to progress workloads to maintain intensity without signs/symptoms of physical distress.    Average METs 4.3      Resistance Training   Weight 7 lb    Reps 10-15      Interval Training   Interval Training No      Treadmill   MPH 3    Grade 2    Minutes 15    METs 4.12      Recumbant Elliptical   Level 2.5      Home Exercise Plan   Plans to continue exercise at Home (comment)   walking   Frequency Add 3 additional days to program exercise sessions.    Initial  Home Exercises Provided 04/03/20           Nutrition:  Target Goals: Understanding of nutrition guidelines, daily intake of sodium '1500mg'$ , cholesterol '200mg'$ , calories 30% from fat and 7% or less from saturated fats, daily to have 5 or more servings of fruits and vegetables.  Education: All About Nutrition: -Group instruction provided by verbal, written material, interactive activities, discussions, models, and posters to present general guidelines for heart healthy nutrition including fat, fiber, MyPlate, the role of sodium in heart healthy nutrition, utilization of the nutrition label, and utilization of this knowledge for meal planning. Follow up email sent as well. Written material given at graduation. Flowsheet Row Cardiac Rehab from 04/24/2020 in [facility] Cardiac and Pulmonary Rehab  Education need identified 03/19/20  Date 03/21/20  Educator [place]  Instruction Review Code 1- Verbalizes Understanding      Biometrics:  Pre Biometrics - 03/19/20 1022      Pre Biometrics   Height 5' 10.4" (1.788 m)    Weight 162 lb 6.4 oz (73.7 kg)    BMI (Calculated) 23.04    Single Leg Stand 30 seconds            Nutrition Therapy Plan and Nutrition Goals:  Nutrition Therapy & Goals - 03/19/20 0947      Nutrition Therapy   Diet heart healthy, low Na    Protein (specify units) 60g    Fiber 30 grams    Whole Grain Foods 3 servings    Saturated Fats 12 max. 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 Down about 10 lbs, weight stable now. B: 1 egg with toast (whole wheat) or cereal (oatmeal - rolled oats) with brown sugar and walnuts. Decaf cooffee with almond milk sweetener. L: [employer] restaurant - grilled flounder with salad and vegetable or  leftovers or lean cuisine "jazzed up" S: apple or  peanuts or crackers with peanut butter D: light supper: favorite is shrimp salad, chicken, fish, brown rice, vegetables (broccoli, green beans, beans), bean soup. uses olive oil and canola oil. "50% salt" seasoning and mrs dash (low salt). Drinks:unsweet tea with slenda, fresca. Met with RD at [facility]. They are including more beans and are reading Dr. Maurine Minister book and cookbook, discussed his philosophy. Discussed how eating mostly whole plant foods can help with heart health and diabetes. Discussed heart healthy eaitng and diabetes friendly eating.      Intervention Plan   Intervention Prescribe, educate and counsel regarding individualized specific dietary modifications aiming towards targeted core components such as weight, hypertension, lipid management, diabetes, heart failure and other comorbidities.;Nutrition handout(s) given to patient.    Expected Outcomes Short Term Goal: Understand basic principles of dietary content, such as calories, fat, sodium, cholesterol and nutrients.;Short Term Goal: A plan has been developed with personal nutrition goals set during dietitian appointment.;Long Term Goal: Adherence to prescribed nutrition plan.           Nutrition Assessments:  MEDIFICTS Score Key:  ?70 Need to make dietary changes   40-70 Heart Healthy Diet  ? 40 Therapeutic Level Cholesterol Diet  Flowsheet Row Cardiac Rehab from 03/19/2020 in [facility] Cardiac and Pulmonary Rehab  Picture Your Plate Total Score on Admission 81     Picture Your Plate Scores:  <69 Unhealthy dietary pattern with much room for improvement.  41-50 Dietary pattern unlikely to meet recommendations for good health and room for improvement.  51-60 More healthful dietary pattern, with some room for improvement.   >60 Healthy dietary pattern, although there may be some specific behaviors that could be improved.    Nutrition Goals Re-Evaluation:  Nutrition Goals Re-Evaluation    Narcissa Name 04/17/20 0932 05/06/20  0945           Goals   Nutrition Goal ST: eat more plant centered meals, try different meals in cookbooks LT: include 8 fruit and vegetable servings with variety --      Comment They have been eating beans with some meat in it as well as some salads to include more plants- he and his wife used to live in [place] so they are used to having beans. He will have grilled fish often. He reports not experimented just yet - but they have started to go through more cookbooks. Izaac and his wife are still using more beans and fish.  They havent found any new recipes.  They use whole grain rice, beans,etc.  His wife is an Therapist, sports so she takes good care of him      Expected Outcome ST: eat more plant centered meals, try different meals in cookbooks LT: include 8 fruit and vegetable servings with variety Short:  try more recipes Long:  increase fruit and vegetables             Nutrition Goals Discharge (Final Nutrition Goals Re-Evaluation):  Nutrition Goals Re-Evaluation - 05/06/20 0945      Goals   Comment Halley and his wife are still using more beans and fish.  They havent found any new recipes.  They use whole grain rice, beans,etc.  His wife is an Therapist, sports so she takes good care of him    Expected Outcome Short:  try more recipes Long:  increase fruit and vegetables           Psychosocial: Target Goals: Acknowledge presence or absence  of significant depression and/or stress, maximize coping skills, provide positive support system. Participant is able to verbalize types and ability to use techniques and skills needed for reducing stress and depression.   Education: Stress, Anxiety, and Depression - Group verbal and visual presentation to define topics covered.  Reviews how body is impacted by stress, anxiety, and depression.  Also discusses healthy ways to reduce stress and to treat/manage anxiety and depression.  Written material given at graduation.   Education: Sleep Hygiene -Provides group verbal and  written instruction about how sleep can affect your health.  Define sleep hygiene, discuss sleep cycles and impact of sleep habits. Review good sleep hygiene tips.    Initial Review & Psychosocial Screening:  Initial Psych Review & Screening - 03/18/20 1329      Initial Review   Current issues with None Identified      Family Dynamics   Good Support System? Yes    Comments He can look to his wife and his son that lives close by. Kalel has a positive outlook on his health since he has got a good report form the doctor.      Barriers   Psychosocial barriers to participate in program The patient should benefit from training in stress management and relaxation.;There are no identifiable barriers or psychosocial needs.      Screening Interventions   Interventions Encouraged to exercise;To provide support and resources with identified psychosocial needs;Provide feedback about the scores to participant    Expected Outcomes Short Term goal: Utilizing psychosocial counselor, staff and physician to assist with identification of specific Stressors or current issues interfering with healing process. Setting desired goal for each stressor or current issue identified.;Long Term Goal: Stressors or current issues are controlled or eliminated.;Short Term goal: Identification and review with participant of any Quality of Life or Depression concerns found by scoring the questionnaire.;Long Term goal: The participant improves quality of Life and PHQ9 Scores as seen by post scores and/or verbalization of changes           Quality of Life Scores:   Quality of Life - 03/19/20 1022      Quality of Life   Select Quality of Life      Quality of Life Scores   Health/Function Pre 23.17 %    Socioeconomic Pre 24 %    Psych/Spiritual Pre 24 %    Family Pre 28.8 %    GLOBAL Pre 24.33 %          Scores of 19 and below usually indicate a poorer quality of life in these areas.  A difference of  2-3 points is a  clinically meaningful difference.  A difference of 2-3 points in the total score of the Quality of Life Index has been associated with significant improvement in overall quality of life, self-image, physical symptoms, and general health in studies assessing change in quality of life.  PHQ-9: Recent Review Flowsheet Data    Depression screen [facility] 2/9 03/19/2020 03/11/2020 01/11/2020 06/13/2018 06/09/2017   Decreased Interest 0 0 0 0 0   Down, Depressed, Hopeless 0 0 0 0 0   PHQ - 2 Score 0 0 0 0 0   Altered sleeping 0 - - - -   Tired, decreased energy 0 - - - -   Change in appetite 0 - - - -   Feeling bad or failure about yourself  0 - - - -   Trouble concentrating 0 - - - -  Moving slowly or fidgety/restless 0 - - - -   Suicidal thoughts 0 - - - -   PHQ-9 Score 0 - - - -   Difficult doing work/chores Not difficult at all - - - -     Interpretation of Total Score  Total Score Depression Severity:  1-4 = Minimal depression, 5-9 = Mild depression, 10-14 = Moderate depression, 15-19 = Moderately severe depression, 20-27 = Severe depression   Psychosocial Evaluation and Intervention:  Psychosocial Evaluation - 03/18/20 1330      Psychosocial Evaluation & Interventions   Interventions Encouraged to exercise with the program and follow exercise prescription    Comments He can look to his wife and his son that lives close by. Maor has a positive outlook on his health since he has got a good report form the doctor.    Expected Outcomes Short: Exercise regularly to support mental health and notify staff of any changes. Long: maintain mental health and well being through teaching of rehab or prescribed medications independently.    Continue Psychosocial Services  Follow up required by staff           Psychosocial Re-Evaluation:  Psychosocial Re-Evaluation    Row Name 04/17/20 [phone] 05/06/20 0941           Psychosocial Re-Evaluation   Current issues with None Identified --      Comments He  reports feeling great and feels extremely thankful. He reports his wife is a great support system for him as well as his work friends and church community. [place]ton feels positive overall - only worry is the war in [place].  he still can rely on church and work friends for support.      Expected Outcomes ST/LT: continue to maintain positive attitude and support system Short:  continue to turn to support system for stress relief Long:  manage stress long term      Interventions Encouraged to attend Cardiac Rehabilitation for the exercise --      Continue Psychosocial Services  Follow up required by staff --             Psychosocial Discharge (Final Psychosocial Re-Evaluation):  Psychosocial Re-Evaluation - 05/06/20 0941      Psychosocial Re-Evaluation   Comments Demetruis feels positive overall - only worry is the war in [place].  he still can rely on church and work friends for support.    Expected Outcomes Short:  continue to turn to support system for stress relief Long:  manage stress long term           Vocational Rehabilitation: Provide vocational rehab assistance to qualifying candidates.   Vocational Rehab Evaluation & Intervention:   Education: Education Goals: Education classes will be provided on a variety of topics geared toward better understanding of heart health and risk factor modification. Participant will state understanding/return demonstration of topics presented as noted by education test scores.  Learning Barriers/Preferences:  Learning Barriers/Preferences - 03/18/20 1328      Learning Barriers/Preferences   Learning Barriers None    Learning Preferences None           General Cardiac Education Topics:  AED/CPR: - Group verbal and written instruction with the use of models to demonstrate the basic use of the AED with the basic ABC's of resuscitation.   Anatomy and Cardiac Procedures: - Group verbal and visual presentation and models provide information  about basic cardiac anatomy and function. Reviews the testing methods done to diagnose heart disease  and the outcomes of the test results. Describes the treatment choices: Medical Management, Angioplasty, or Coronary Bypass Surgery for treating various heart conditions including Myocardial Infarction, Angina, Valve Disease, and Cardiac Arrhythmias.  Written material given at graduation. Flowsheet Row Cardiac Rehab from 04/24/2020 in [facility] Cardiac and Pulmonary Rehab  Education need identified 03/19/20      Medication Safety: - Group verbal and visual instruction to review commonly prescribed medications for heart and lung disease. Reviews the medication, class of the drug, and side effects. Includes the steps to properly store meds and maintain the prescription regimen.  Written material given at graduation. Flowsheet Row Cardiac Rehab from 04/24/2020 in [facility] Cardiac and Pulmonary Rehab  Date 03/27/20  Educator SB  Instruction Review Code 1- Verbalizes Understanding      Intimacy: - Group verbal instruction through game format to discuss how heart and lung disease can affect sexual intimacy. Written material given at graduation.. Flowsheet Row Cardiac Rehab from 04/24/2020 in [facility] Cardiac and Pulmonary Rehab  Date 04/24/20  Educator jh  Instruction Review Code 1- Verbalizes Understanding      Know Your Numbers and Heart Failure: - Group verbal and visual instruction to discuss disease risk factors for cardiac and pulmonary disease and treatment options.  Reviews associated critical values for Overweight/Obesity, Hypertension, Cholesterol, and Diabetes.  Discusses basics of heart failure: signs/symptoms and treatments.  Introduces Heart Failure Zone chart for action plan for heart failure.  Written material given at graduation.   Infection Prevention: - Provides verbal and written material to individual with discussion of infection control including proper hand washing and proper equipment  cleaning during exercise session. Flowsheet Row Cardiac Rehab from 04/24/2020 in [facility] Cardiac and Pulmonary Rehab  Date 03/18/20  Educator [facility]  Instruction Review Code 1- Verbalizes Understanding      Falls Prevention: - Provides verbal and written material to individual with discussion of falls prevention and safety. Flowsheet Row Cardiac Rehab from 04/24/2020 in [facility] Card[facility] and Pulmonary Rehab  Date 03/18/20  Educator [facility]  Instruction Review Code 1- Verbalizes Understanding      Other: -Provides group and verbal instruction on various topics (see comments)   Knowledge Questionnaire Score:  Knowledge Questionnaire Score - 03/19/20 1022      Knowledge Questionnaire Score   Pre Score 24/26 Education Focus: MI, nutrition           Core Components/Risk Factors/Patient Goals at Admission:  Personal Goals and Risk Factors at Admission - 03/19/20 1029      Core Components/Risk Factors/Patient Goals on Admission    Weight Management Yes;Weight Maintenance    Intervention Weight Management: Develop a combined nutrition and exercise program designed to reach desired caloric intake, while maintaining appropriate intake of nutrient and fiber, sodium and fats, and appropriate energy expenditure required for the weight goal.;Weight Management: Provide education and appropriate resources to help participant work on and attain dietary goals.;Weight Management/Obesity: Establish reasonable short term and long term weight goals.    Admit Weight 162 lb 6.4 oz (73.7 kg)    Goal Weight: Short Term 164 lb (74.4 kg)    Goal Weight: Long Term 164 lb (74.4 kg)    Expected Outcomes Short Term: Continue to assess and modify interventions until short term weight is achieved;Long Term: Adherence to nutrition and physical activity/exercise program aimed toward attainment of established weight goal;Weight Maintenance: Understanding of the daily nutrition guidelines, which includes 25-35% calories from fat, 7%  or less cal from saturated fats, less than $RemoveB'200mg'VFgxWlIt$  cholesterol,  less than 1.5gm of sodium, & 5 or more servings of fruits and vegetables daily    Diabetes Yes    Intervention Provide education about signs/symptoms and action to take for hypo/hyperglycemia.;Provide education about proper nutrition, including hydration, and aerobic/resistive exercise prescription along with prescribed medications to achieve blood glucose in normal ranges: Fasting glucose 65-99 mg/dL    Expected Outcomes Short Term: Participant verbalizes understanding of the signs/symptoms and immediate care of hyper/hypoglycemia, proper foot care and importance of medication, aerobic/resistive exercise and nutrition plan for blood glucose control.;Long Term: Attainment of HbA1C < 7%.    Hypertension Yes    Intervention Provide education on lifestyle modifcations including regular physical activity/exercise, weight management, moderate sodium restriction and increased consumption of fresh fruit, vegetables, and low fat dairy, alcohol moderation, and smoking cessation.;Monitor prescription use compliance.    Expected Outcomes Short Term: Continued assessment and intervention until BP is < 140/52mm HG in hypertensive participants. < 130/33mm HG in hypertensive participants with diabetes, heart failure or chronic kidney disease.;Long Term: Maintenance of blood pressure at goal levels.    Lipids Yes    Intervention Provide education and support for participant on nutrition & aerobic/resistive exercise along with prescribed medications to achieve LDL '70mg'$ , HDL >$Remo'40mg'IpBzH$ .    Expected Outcomes Short Term: Participant states understanding of desired cholesterol values and is compliant with medications prescribed. Participant is following exercise prescription and nutrition guidelines.;Long Term: Cholesterol controlled with medications as prescribed, with individualized exercise RX and with personalized nutrition plan. Value goals: LDL < $Rem'70mg'UTwC$ , HDL > 40 mg.            Education:Diabetes - Individual verbal and written instruction to review signs/symptoms of diabetes, desired ranges of glucose level fasting, after meals and with exercise. Acknowledge that pre and post exercise glucose checks will be done for 3 sessions at entry of program. [place] from 04/24/2020 in [facility] Cardiac and Pulmonary Rehab  Date 03/18/20  Educator [facility]  Instruction Review Code 1- Verbalizes Understanding      Core Components/Risk Factors/Patient Goals Review:   Goals and Risk Factor Review    Row Name 04/17/20 0934 05/06/20 0938           Core Components/Risk Factors/Patient Goals Review   Personal Goals Review Hypertension Hypertension;Tobacco Cessation      Review He reports taking his BP at home, but not often as it has been doing well - his wife is a Marine scientist. Encouraged to take BP consistently at home especially after rehab. He reports taking his medication as prescribed. Devantae has not been checking BP at home.  Today at [facility] it was 118/62 resting.  He reports taking all medication as directed.      Expected Outcomes ST: consistently take BP at home LT: Continue to manage risk factors Short: monitor BP consistently whether at home or HT Long: manage risk factors long term             Core Components/Risk Factors/Patient Goals at Discharge (Final Review):   Goals and Risk Factor Review - 05/06/20 0938      Core Components/Risk Factors/Patient Goals Review   Personal Goals Review Hypertension;Tobacco Cessation    Review Lucus has not been checking BP at home.  Today at [facility] it was 118/62 resting.  He reports taking all medication as directed.    Expected Outcomes Short: monitor BP consistently whether at home or HT Long: manage risk factors long term  ITP Comments:  ITP Comments    Row Name 03/18/20 1341 03/19/20 1017 03/22/20 0926 04/03/20 0906 05/01/20 0631   ITP Comments Virtual Visit completed. Patient informed on EP and RD  appointment and 6 Minute walk test. Patient also informed of patient health questionnaires on My Chart. Patient Verbalizes understanding. Visit diagnosis [facility] found in [facility] 01/26/[facility]. Completed 6MWT and gym orientation. Initial ITP created and sent for review to Dr. Emily Filbert, Medical Director. First full day of exercise!  Patient was oriented to gym and equipment including functions, settings, policies, and procedures.  Patient's individual exercise prescription and treatment plan were reviewed.  All starting workloads were established based on the results of the 6 minute walk test done at initial orientation visit.  The plan for exercise progression was also introduced and progression will be customized based on patient's performance and goals. 30 Day review completed. Medical Director ITP review done, changes made as directed, and signed approval by Medical Director.  new to program 30 Day review completed. Medical Director ITP review done, changes made as directed, and signed approval by Medical Director.   [place] Name 05/29/20 0736           ITP Comments 30 Day review completed. Medical Director ITP review done, changes made as directed, and signed approval by Medical Director.              Comments:

## 2020-06-03 ENCOUNTER — Encounter: Payer: Medicare Other | Admitting: *Deleted

## 2020-06-03 ENCOUNTER — Other Ambulatory Visit: Payer: Self-pay

## 2020-06-03 DIAGNOSIS — Z951 Presence of aortocoronary bypass graft: Secondary | ICD-10-CM | POA: Diagnosis not present

## 2020-06-03 DIAGNOSIS — Z5189 Encounter for other specified aftercare: Secondary | ICD-10-CM | POA: Diagnosis not present

## 2020-06-03 NOTE — Progress Notes (Signed)
Daily Session Note  Patient Details  Name: Luis Wise MRN: [id number] Date of Birth: [birth year] Referring Provider:   Flowsheet Row Cardiac Rehab from 03/19/2020 in [facility] Cardiac and Pulmonary Rehab  Referring Provider [name] MD      Encounter Date: 06/03/2020  Check In:  Session Check In - 06/03/20 1000      Check-In   Supervising physician immediately available to respond to emergencies See telemetry face sheet for immediately available ER MD    Location [facility]-Cardiac & Pulmonary Rehab    Staff Present Heath Lark, RN, BSN, Laveda Norman, BS, ACSM CEP, Exercise Physiologist;Joseph Tessie Fass RCP,RRT,BSRT    Virtual Visit No    Medication changes reported     No    Fall or balance concerns reported    No    Warm-up and Cool-down Performed on first and last piece of equipment    Resistance Training Performed Yes    VAD Patient? No    PAD/SET Patient? No      Pain Assessment   Currently in Pain? No/denies              Social History   Tobacco Use  Smoking Status Never Smoker  Smokeless Tobacco Never Used    Goals Met:  Independence with exercise equipment Exercise tolerated well No report of cardiac concerns or symptoms  Goals Unmet:  Not Applicable  Comments: Pt able to follow exercise prescription today without complaint.  Will continue to monitor for progression.    Dr. Emily Filbert is Medical Director for [facility].

## 2020-06-05 ENCOUNTER — Encounter: Payer: Self-pay | Admitting: Family Medicine

## 2020-06-06 ENCOUNTER — Telehealth: Payer: Self-pay | Admitting: Family Medicine

## 2020-06-06 ENCOUNTER — Ambulatory Visit: Payer: Medicare Other

## 2020-06-06 NOTE — Telephone Encounter (Signed)
June 06, 2020      9:23 AM Wilburn Cornelia, CMA routed this conversation to Joaquim Nam, MD        9:10 AM Damita Lack, CMA routed this conversation to Wilburn Cornelia [facility]   June 05, 2020  Karle Barr to Joaquim Nam, MD      10:00 PM This message is being sent by Otho Darner on behalf of JORAH HUA.  Hi Dr Para March, I'm hOping you can help [place].  All has been going well until Monday nite when Tivon had a sudden night of bad vomiting & diarrhea.  By Tuesday afternoon he was able to eat small bites of the BRAT diet as well as good amts of Pedialite. There has been no further v or d.   Today, Wednesday, he ate more BRAT, but by this evening , felt nauseated again, and couldn't eat anything.  We have ditched his nonessential meds, and any blood sugars we have taken are in the 190-210 range. I'm getting concerned about what to do with his meds unless he starts eating. What would you think about a zofran Rx? Not sure either of what set him off. Thanks for all suggestions.

## 2020-06-06 NOTE — Telephone Encounter (Signed)
I spoke with pt; pt said he has had no more N & V and is over whatever he had; pt said " I am fine". When I asked what his blood sugar was running pt said it is still around 200 but they are working with endocrinology about that and pt appreciated the call. Advised pt to cb if needed and pt voiced understanding. Sending note to DR Para March.

## 2020-06-06 NOTE — Telephone Encounter (Signed)
See mychart message, please triage patient.  Thanks.  

## 2020-06-07 ENCOUNTER — Encounter: Payer: Medicare Other | Attending: Cardiovascular Disease | Admitting: *Deleted

## 2020-06-07 ENCOUNTER — Other Ambulatory Visit: Payer: Self-pay

## 2020-06-07 DIAGNOSIS — Z951 Presence of aortocoronary bypass graft: Secondary | ICD-10-CM | POA: Insufficient documentation

## 2020-06-07 NOTE — Patient Instructions (Signed)
Discharge Patient Instructions  Patient Details  Name: Luis Wise MRN: [id number] Date of Birth: Jul 19, 1940 Referring Provider:  Wellington Hampshire, MD   Number of Visits: 55  Reason for Discharge:  Patient reached a stable level of exercise. Patient independent in their exercise. Patient has met program and personal goals.  Smoking History:  Social History   Tobacco Use  Smoking Status Never Smoker  Smokeless Tobacco Never Used    Diagnosis:  No diagnosis found.  Initial Exercise Prescription:  Initial Exercise Prescription - 03/19/20 1000      Date of Initial Exercise RX and Referring Provider   Date 03/19/20    Referring Provider [name] MD      Treadmill   MPH 2.7    Grade 1    Minutes 15    METs 3.44      Recumbant Bike   Level 2    RPM 50    Watts 27    Minutes 15    METs 3.1      NuStep   Level 3    SPM 80    Minutes 15    METs 3.1      Recumbant Elliptical   Level 2    RPM 50    Minutes 15    METs 3      Elliptical   Level 1    Speed 2.9    Minutes 15      Prescription Details   Frequency (times per week) 3    Duration Progress to 30 minutes of continuous aerobic without signs/symptoms of physical distress      Intensity   THRR 40-80% of Max Heartrate 102-129    Ratings of Perceived Exertion 11-13    Perceived Dyspnea 0-4      Progression   Progression Continue to progress workloads to maintain intensity without signs/symptoms of physical distress.      Resistance Training   Weight 4 lb    Reps 10-15           Discharge Exercise Prescription (Final Exercise Prescription Changes):  Exercise Prescription Changes - 06/04/20 1400      Response to Exercise   Blood Pressure (Admit) 118/64    Blood Pressure (Exercise) 162/80    Blood Pressure (Exit) 122/60    Heart Rate (Admit) 76 bpm    Heart Rate (Exercise) 96 bpm    Heart Rate (Exit) 85 bpm    Rating of Perceived Exertion (Exercise) 13    Symptoms none     Duration Continue with 30 min of aerobic exercise without signs/symptoms of physical distress.    Intensity THRR unchanged      Progression   Progression Continue to progress workloads to maintain intensity without signs/symptoms of physical distress.    Average METs 3.63      Resistance Training   Weight 7 lb    Reps 10-15      Interval Training   Interval Training No      Treadmill   MPH 3    Grade 2    Minutes 15    METs 4.12      Recumbant Bike   Level 4    Minutes 15    METs 4.2      NuStep   Level 6    Minutes 15    METs 4      Recumbant Elliptical   Level 2.5    Minutes 15    METs 2.2  Home Exercise Plan   Plans to continue exercise at Home (comment)   walking   Frequency Add 3 additional days to program exercise sessions.    Initial Home Exercises Provided 04/03/20           Functional Capacity:  6 Minute Walk    Row Name 03/19/20 1018 05/22/20 1113       6 Minute Walk   Phase Initial Discharge    Distance 1540 feet 1895 feet    Distance % Change -- 23 %    Walk Time 6 minutes 6 minutes    # of Rest Breaks 0 0    MPH 2.92 3.6    METS 3.17 3.9    RPE 11 11    VO2 Peak 11.1 13.64    Symptoms No No    Resting HR 75 bpm 76 bpm    Resting BP 124/74 130/60    Resting Oxygen Saturation  99 % --    Exercise Oxygen Saturation  during 6 min walk 99 % --    Max Ex. HR 86 bpm 92 bpm    Max Ex. BP 136/60 150/60    2 Minute Post BP 126/58 --          hNutrition:  Nutrition Therapy & Goals - 03/19/20 0947      Nutrition Therapy   Diet heart healthy, low Na    Protein (specify units) 60g    Fiber 30 grams    Whole Grain Foods 3 servings    Saturated Fats 12 max. 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 Down about 10 lbs, weight stable  now. B: 1 egg with toast (whole wheat) or cereal (oatmeal - rolled oats) with brown sugar and walnuts. Decaf cooffee with almond milk sweetener. L: Warden/ranger - grilled flounder with salad and vegetable or leftovers or lean cuisine "jazzed up" S: apple or peanuts or crackers with peanut butter D: light supper: favorite is shrimp salad, chicken, fish, brown rice, vegetables (broccoli, green beans, beans), bean soup. uses olive oil and canola oil. "50% salt" seasoning and mrs dash (low salt). Drinks:unsweet tea with slenda, fresca. Met with RD at [facility]. They are including more beans and are reading Dr. Maurine Minister book and cookbook, discussed his philosophy. Discussed how eating mostly whole plant foods can help with heart health and diabetes. Discussed heart healthy eaitng and diabetes friendly eating.      Intervention Plan   Intervention Prescribe, educate and counsel regarding individualized specific dietary modifications aiming towards targeted core components such as weight, hypertension, lipid management, diabetes, heart failure and other comorbidities.;Nutrition handout(s) given to patient.    Expected Outcomes Short Term Goal: Understand basic principles of dietary content, such as calories, fat, sodium, cholesterol and nutrients.;Short Term Goal: A plan has been developed with personal nutrition goals set during dietitian appointment.;Long Term Goal: Adherence to prescribed nutrition plan.          Goals reviewed with patient; copy given to patient.

## 2020-06-07 NOTE — Progress Notes (Signed)
Daily Session Note  Patient Details  Name: Luis Wise MRN: [id number] Date of Birth: 18-Aug-1940 Referring Provider:   Flowsheet Row Cardiac Rehab from 03/19/2020 in [facility]  Referring Provider Luis Sacramento MD      Encounter Date: 06/07/2020  Check In:  Session Check In - 06/07/20 1008      Check-In   Supervising physician immediately available to respond to emergencies See telemetry face sheet for immediately available ER MD    Location [facility]    Staff Present Luis Lark, RN, BSN, CCRP;Luis Minturn, MA, RCEP, CCRP, CCET;Luis Wise RCP,RRT,BSRT    Virtual Visit No    Medication changes reported     No    Fall or balance concerns reported    No    Warm-up and Cool-down Performed on first and last piece of equipment    Resistance Training Performed Yes    VAD Patient? No    PAD/SET Patient? No      Pain Assessment   Currently in Pain? No/denies              Social History   Tobacco Use  Smoking Status Never Smoker  Smokeless Tobacco Never Used    Goals Met:  Independence with exercise equipment Exercise tolerated well Personal goals reviewed No report of cardiac concerns or symptoms  Goals Unmet:  Not Applicable  Comments:  Luis Wise graduated today from  rehab with 34 sessions completed.  Details of the patient's exercise prescription and what He needs to do in order to continue the prescription and progress were discussed with patient.  Patient was given a copy of prescription and goals.  Patient verbalized understanding.  Luis Wise plans to continue to exercise by going back to exercising at the park.    Dr. Emily Wise is Medical Director for [name].

## 2020-06-07 NOTE — Progress Notes (Signed)
Cardiac Individual Treatment Plan  Patient Details  Name: Luis Wise MRN: [id number] Date of Birth: 09/05/1940 Referring Provider:   Flowsheet Row Cardiac Rehab from 03/19/2020 in [facility] Cardiac and Pulmonary Rehab  Referring Provider [name] MD      Initial Encounter Date:  Flowsheet Row Cardiac Rehab from 03/19/2020 in [facility] Cardiac and Pulmonary Rehab  Date 03/19/20      Visit Diagnosis: S/P CABG x 5  Patient's Home Medications on Admission:  Current Outpatient Medications:  .  amLODipine (NORVASC) 10 MG tablet, Take 1 tablet (10 mg total) by mouth daily., Disp: 90 tablet, Rfl: 1 .  aspirin EC 81 MG tablet, Take 1 tablet (81 mg total) by mouth daily. Swallow whole., Disp: 150 tablet, Rfl: 2 .  atorvastatin (LIPITOR) 80 MG tablet, Take 1 tablet (80 mg total) by mouth daily., Disp: 90 tablet, Rfl: 3 .  carvedilol (COREG) 12.5 MG tablet, Take 1 tablet (12.5 mg total) by mouth 2 (two) times daily., Disp: 180 tablet, Rfl: 3 .  cholecalciferol (VITAMIN D3) 25 MCG (1000 UNIT) tablet, Take 1,000 Units by mouth daily., Disp: , Rfl:  .  Coenzyme Q10 (COQ10) 100 MG CAPS, Take 100 mg by mouth daily. , Disp: , Rfl:  .  dapagliflozin propanediol (FARXIGA) 5 MG TABS tablet, Take 1 tablet (5 mg total) by mouth daily before breakfast., Disp: 30 tablet, Rfl: 11 .  fenofibrate 160 MG tablet, Take 1 tablet (160 mg total) by mouth daily., Disp: 90 tablet, Rfl: 1 .  ferrous sulfate 324 (65 Fe) MG TBEC, Take 1 tablet (325 mg total) by mouth every Monday, Wednesday, and Friday., Disp: , Rfl:  .  glimepiride (AMARYL) 4 MG tablet, TAKE 1 TABLET BY MOUTH EVERY DAY WITH BREAKFAST, Disp: 90 tablet, Rfl: 1 .  glucose blood (ONETOUCH ULTRA) test strip, TEST 1-2x a day, Disp: 200 strip, Rfl: 3 .  lisinopril (ZESTRIL) 20 MG tablet, TAKE 1 TABLET BY MOUTH EVERY DAY, Disp: 90 tablet, Rfl: 1 .  lisinopril-hydrochlorothiazide (ZESTORETIC) 20-25 MG tablet, Take 1 tablet by mouth daily., Disp: 90 tablet, Rfl:  1 .  metFORMIN (GLUCOPHAGE) 1000 MG tablet, $RemoveBe'1500mg'[id number]$  in the PM, Disp: , Rfl:  .  Misc Natural Products (URINOZINC PO), Take 1 tablet by mouth in the morning and at bedtime. , Disp: , Rfl:  .  Multiple Vitamins-Minerals (PRESERVISION AREDS 2 PO), Take 1 tablet by mouth 2 (two) times daily. , Disp: , Rfl:  .  sildenafil (REVATIO) 20 MG tablet, Take 20 mg by mouth as needed., Disp: , Rfl:  .  traZODone (DESYREL) 50 MG tablet, TAKE 1 TO 1.5 TABLETS BY MOUTH EVERY NIGHT FOR SLEEP, Disp: 135 tablet, Rfl: 3 .  ZINC OXIDE PO, Take by mouth., Disp: , Rfl:   Past Medical History: Past Medical History:  Diagnosis Date  . Allergy    Amoxicillin  . Cataract 2017   Mild progression  . Coronary artery disease    a. s/p 5-vessel CABG (LIMA-LAD, SVG-diag, sequential SVG-OM1-OM 2, & SVG-PDA)  . Diabetes mellitus with complication ([place])   . Hyperlipidemia   . Hypertension   . Insomnia   . neuropathy    bilateral feet  . Postoperative atrial fibrillation (HCC)     Tobacco Use: Social History   Tobacco Use  Smoking Status Never Smoker  Smokeless Tobacco Never Used    Labs: Recent Review Scientist, physiological    Labs for ITP Cardiac and Pulmonary Rehab Latest Ref Rng & Units 01/29/2020 01/29/2020 01/31/2020  03/11/2020 04/11/2020   Cholestrol 0 - 200 mg/dL - - - - 122   LDLCALC 0 - 99 mg/dL - - - - 66   LDLDIRECT mg/dL - - - - -   HDL >40 mg/dL - - - - 31(L)   Trlycerides <150 mg/dL - - - - 127   Hemoglobin A1c 4.0 - 5.6 % - - - 6.9(A) -   PHART 7.350 - 7.450 7.541(H) 7.386 - - -   PCO2ART 32.0 - 48.0 mmHg 22.8(L) 38.0 - - -   HCO3 20.0 - 28.0 mmol/L 19.6(L) 22.7 - - -   TCO2 22 - 32 mmol/L 20(L) 24 - - -   ACIDBASEDEF 0.0 - 2.0 mmol/L 2.0 2.0 - - -   O2SAT % 98.0 90.0 73.7 - -       Exercise Target Goals: Exercise Program Goal: Individual exercise prescription set using results from initial 6 min walk test and THRR while considering  patient's activity barriers and safety.   Exercise  Prescription Goal: Initial exercise prescription builds to 30-45 minutes a day of aerobic activity, 2-3 days per week.  Home exercise guidelines will be given to patient during program as part of exercise prescription that the participant will acknowledge.   Education: Aerobic Exercise: - Group verbal and visual presentation on the components of exercise prescription. Introduces F.I.T.T principle from ACSM for exercise prescriptions.  Reviews F.I.T.T. principles of aerobic exercise including progression. Written material given at graduation. Flowsheet Row Cardiac Rehab from 04[facility]24/2020 in [facility] Cardiac and Pulmonary Rehab  Date 04/24/20  Educator jh  Instruction Review Code 1- Verbalizes Understanding      Education: Resistance Exercise: - Group verbal and visual presentation on the components of exercise prescription. Introduces F.I.T.T principle from ACSM for exercise prescriptions  Reviews F.I.T.T. principles of resistance exercise including progression. Written material given at graduation.    Education: Exercise & Equipment Safety: - Individual verbal instruction and demonstration of equipment use and safety with use of the equipment. Flowsheet Row Cardiac Rehab from 04/24/2020 in [facility] Cardiac and Pulmonary Rehab  Date 03/18/20  Educator [facility]  Instruction Review Code 1- Verbalizes Understanding      Education: Exercise Physiology & General Exercise Guidelines: - Group verbal and written instruction with models to review the exercise physiology of the cardiovascular system and associated critical values. Provides general exercise guidelines with specific guidelines to those with heart or lung disease.  Flowsheet Row Cardiac Rehab from 04/24/2020 in [facility] Cardiac and Pulmonary Rehab  Date 04/17/20  Educator [facility] Miller [facility]  Instruction Review Code 1- Verbalizes Understanding      Education: Flexibility, Balance, Mind/Body Relaxation: - Group verbal and visual presentation with interactive activity  on the components of exercise prescription. Introduces F.I.T.T principle from ACSM for exercise prescriptions. Reviews F.I.T.T. principles of flexibility and balance exercise training including progression. Also discusses the mind body connection.  Reviews various relaxation techniques to help reduce and manage stress (i.e. Deep breathing, progressive muscle relaxation, and visualization). Balance handout provided to take home. Written material given at graduation.   Activity Barriers & Risk Stratification:  Activity Barriers & Cardiac Risk Stratification - 03/19/20 1018      Activity Barriers & Cardiac Risk Stratification   Activity Barriers None    Cardiac Risk Stratification High           6 Minute Walk:  6 Minute Walk    Row Name 03/19/20 1018 05/22/20 1113       6 Minute Walk   Phase  Initial Discharge    Distance 1540 feet 1895 feet    Distance % Change -- 23 %    Walk Time 6 minutes 6 minutes    # of Rest Breaks 0 0    MPH 2.92 3.6    METS 3.17 3.9    RPE 11 11    VO2 Peak 11.1 13.64    Symptoms No No    Resting HR 75 bpm 76 bpm    Resting BP 124/74 130/60    Resting Oxygen Saturation  99 % --    Exercise Oxygen Saturation  during 6 min walk 99 % --    Max Ex. HR 86 bpm 92 bpm    Max Ex. BP 136/60 150/60    2 Minute Post BP 126/58 --           Oxygen Initial Assessment:   Oxygen Re-Evaluation:   Oxygen Discharge (Final Oxygen Re-Evaluation):   Initial Exercise Prescription:  Initial Exercise Prescription - 03/19/20 1000      Date of Initial Exercise RX and Referring Provider   Date 03/19/20    Referring Provider [name] MD      Treadmill   MPH 2.7    Grade 1    Minutes 15    METs 3.44      Recumbant Bike   Level 2    RPM 50    Watts 27    Minutes 15    METs 3.1      NuStep   Level 3    SPM 80    Minutes 15    METs 3.1      Recumbant Elliptical   Level 2    RPM 50    Minutes 15    METs 3      Elliptical   Level 1     Speed 2.9    Minutes 15      Prescription Details   Frequency (times per week) 3    Duration Progress to 30 minutes of continuous aerobic without signs/symptoms of physical distress      Intensity   THRR 40-80% of Max Heartrate 102-129    Ratings of Perceived Exertion 11-13    Perceived Dyspnea 0-4      Progression   Progression Continue to progress workloads to maintain intensity without signs/symptoms of physical distress.      Resistance Training   Weight 4 lb    Reps 10-15           Perform Capillary Blood Glucose checks as needed.  Exercise Prescription Changes:  Exercise Prescription Changes    Row Name 03/19/20 1000 03/27/20 0800 04/08/20 1500 04/22/20 1100 05/06/20 1500     Response to Exercise   Blood Pressure (Admit) 124/74 138/66 166/74 110/56 110/60   Blood Pressure (Exercise) 136/60 138/56 128/62 120/64 122/60   Blood Pressure (Exit) 126/58 126/64 120/60 116/74 132/66   Heart Rate (Admit) 75 bpm 75 bpm 70 bpm 70 bpm 69 bpm   Heart Rate (Exercise) 86 bpm 95 bpm 95 bpm 91 bpm 102 bpm   Heart Rate (Exit) 75 bpm 82 bpm 77 bpm 73 bpm 74 bpm   Oxygen Saturation (Admit) 99 % -- -- -- --   Oxygen Saturation (Exercise) 99 % -- -- -- --   Rating of Perceived Exertion (Exercise) _0 Symptoms _1    Comments walk test results first day -- -- --   Duration -- Progress  to 30 minutes of  aerobic without signs/symptoms of physical distress Continue with 30 min of aerobic exercise without signs/symptoms of physical distress. Continue with 30 min of aerobic exercise without signs/symptoms of physical distress. Continue with 30 min of aerobic exercise without signs/symptoms of physical distress.   Intensity -- THRR unchanged THRR unchanged THRR unchanged THRR unchanged     Progression   Progression -- Continue to progress workloads to maintain intensity without signs/symptoms of physical distress. Continue to progress workloads to maintain  intensity without signs/symptoms of physical distress. Continue to progress workloads to maintain intensity without signs/symptoms of physical distress. Continue to progress workloads to maintain intensity without signs/symptoms of physical distress.   Average METs -- 2.9 3.55 3.26 3.88     Resistance Training   Weight -- 4 lb 5 lb 5 lb 7 lb   Reps -- 10-15 10-15 10-15 10-15     Interval Training   Interval Training -- No No No No     Treadmill   MPH -- 3 3 2.8 3   Grade -- _0 Minutes -- _1 METs -- 3.71 3.71 3.92 4.12     Recumbant Bike   Level -- -- 3.5 -- 3.5   Minutes -- -- 15 -- 15   METs -- -- 4 -- 4.2     NuStep   Level -- -- 5 -- 5   Minutes -- -- 15 -- 15   METs -- -- 3.2 -- 4     Recumbant Elliptical   Level -- _2 3.6   RPM -- 50 -- -- --   Minutes -- _3 METs -- 2.1 3.3 2.4 3.2     Home Exercise Plan   Plans to continue exercise at -- -- Home (comment)  walking Home (comment)  walking Home (comment)  walking   Frequency -- -- Add 3 additional days to program exercise sessions. Add 3 additional days to program exercise sessions. Add 3 additional days to program exercise sessions.   Initial Home Exercises Provided -- -- 04/03/20 04/03/20 04/03/20   Row Name 05/20/20 1100 06/04/20 1400           Response to Exercise   Blood Pressure (Admit) 116/64 118/64      Blood Pressure (Exercise) 128/74 162/80      Blood Pressure (Exit) 116/62 122/60      Heart Rate (Admit) 74 bpm 76 bpm      Heart Rate (Exercise) 92 bpm 96 bpm      Heart Rate (Exit) 83 bpm 85 bpm      Rating of Perceived Exertion (Exercise) 13 13      Symptoms none none      Duration Continue with 30 min of aerobic exercise without signs/symptoms of physical distress. Continue with 30 min of aerobic exercise without signs/symptoms of physical distress.      Intensity THRR unchanged THRR unchanged             Progression   Progression Continue to progress workloads to  maintain intensity without signs/symptoms of physical distress. Continue to progress workloads to maintain intensity without signs/symptoms of physical distress.      Average METs 4.3 3.63             Resistance Training   Weight 7 lb 7 lb      Reps 10-15 10-15  Interval Training   Interval Training No No             Treadmill   MPH 3 3      Grade 2 2      Minutes 15 15      METs 4.12 4.12             Recumbant Bike   Level -- 4      Minutes -- 15      METs -- 4.2             NuStep   Level -- 6      Minutes -- 15      METs -- 4             Recumbant Elliptical   Level 2.5 2.5      Minutes -- 15      METs -- 2.2             Home Exercise Plan   Plans to continue exercise at Home (comment)  walking Home (comment)  walking      Frequency Add 3 additional days to program exercise sessions. Add 3 additional days to program exercise sessions.      Initial Home Exercises Provided 04/03/20 04/03/20             Exercise Comments:  Exercise Comments    Row Name 03/22/20 6767 06/07/20 1010         Exercise Comments First full day of exercise!  Patient was oriented to gym and equipment including functions, settings, policies, and procedures.  Patient's individual exercise prescription and treatment plan were reviewed.  All starting workloads were established based on the results of the 6 minute walk test done at initial orientation visit.  The plan for exercise progression was also introduced and progression will be customized based on patient's performance and goals. Luis Wise graduated today from  rehab with 34 sessions completed.  Details of the patient's exercise prescription and what He needs to do in order to continue the prescription and progress were discussed with patient.  Patient was given a copy of prescription and goals.  Patient verbalized understanding.  Luis Wise plans to continue to exercise by going back to exercising at the park.              Exercise Goals and Review:  Exercise Goals    Row Name 03/19/20 1021             Exercise Goals   Increase Physical Activity Yes       Intervention Provide advice, education, support and counseling about physical activity/exercise needs.;Develop an individualized exercise prescription for aerobic and resistive training based on initial evaluation findings, risk stratification, comorbidities and participant's personal goals.       Expected Outcomes Short Term: Attend rehab on a regular basis to increase amount of physical activity.;Long Term: Add in home exercise to make exercise part of routine and to increase amount of physical activity.;Long Term: Exercising regularly at least 3-5 days a week.       Increase Strength and Stamina Yes       Intervention Provide advice, education, support and counseling about physical activity/exercise needs.;Develop an individualized exercise prescription for aerobic and resistive training based on initial evaluation findings, risk stratification, comorbidities and participant's personal goals.       Expected Outcomes Short Term: Perform resistance training exercises routinely during rehab and add in resistance training at home;Short Term: Increase workloads from initial exercise  prescription for resistance, speed, and METs.;Long Term: Improve cardiorespiratory fitness, muscular endurance and strength as measured by increased METs and functional capacity (6MWT)       Able to understand and use rate of perceived exertion (RPE) scale Yes       Intervention Provide education and explanation on how to use RPE scale       Expected Outcomes Short Term: Able to use RPE daily in rehab to express subjective intensity level;Long Term:  Able to use RPE to guide intensity level when exercising independently       Able to understand and use Dyspnea scale Yes       Intervention Provide education and explanation on how to use Dyspnea scale       Expected Outcomes Short  Term: Able to use Dyspnea scale daily in rehab to express subjective sense of shortness of breath during exertion;Long Term: Able to use Dyspnea scale to guide intensity level when exercising independently       Knowledge and understanding of Target Heart Rate Range (THRR) Yes       Intervention Provide education and explanation of THRR including how the numbers were predicted and where they are located for reference       Expected Outcomes Short Term: Able to state/look up THRR;Short Term: Able to use daily as guideline for intensity in rehab;Long Term: Able to use THRR to govern intensity when exercising independently       Able to check pulse independently Yes       Intervention Provide education and demonstration on how to check pulse in carotid and radial arteries.;Review the importance of being able to check your own pulse for safety during independent exercise       Expected Outcomes Short Term: Able to explain why pulse checking is important during independent exercise;Long Term: Able to check pulse independently and accurately       Understanding of Exercise Prescription Yes       Intervention Provide education, explanation, and written materials on patient's individual exercise prescription       Expected Outcomes Short Term: Able to explain program exercise prescription;Long Term: Able to explain home exercise prescription to exercise independently              Exercise Goals Re-Evaluation :  Exercise Goals Re-Evaluation    Row Name 03/22/20 0926 04/08/20 1459 04/17/20 0928 04/22/20 1120 05/06/20 0950     Exercise Goal Re-Evaluation   Exercise Goals Review Increase Physical Activity;Able to understand and use rate of perceived exertion (RPE) scale;Knowledge and understanding of Target Heart Rate Range (THRR);Understanding of Exercise Prescription;Increase Strength and Stamina;Able to understand and use Dyspnea scale;Able to check pulse independently Increase Physical Activity;Increase  Strength and Stamina;Understanding of Exercise Prescription Increase Physical Activity;Increase Strength and Stamina;Understanding of Exercise Prescription Increase Physical Activity;Increase Strength and Stamina Increase Physical Activity   Comments Reviewed RPE and dyspnea scales, THR and program prescription with pt today.  Pt voiced understanding and was given a copy of goals to take home. Luis Wise is off to a good start in rehab.  He is already up to level 4 on the recumbent elliptical and using 5 lb weights.  We will continue to monitor his progress. He reports having an eliipital at home that he hasn't used since starting rehab. When the weather is good he will walk 3 miles in a park [place] 3x/week - he also hasn't done that since starting rehab due to the weather. Luis Wise attends consistently.  He  doesnt reach THR range (likely due to beta blocker) but works at RPE 12-16.  We will continue to monitor progress. Luis Wise isnt exercising outside HT classes. He plans to go back to walking [employer] trail when he finishes HT.   Expected Outcomes Short: Use RPE daily to regulate intensity. Long: Follow program prescription in THR. Short: Add resistance to recumbent bike Long: Continue to improve stamina Short: Exercise 2x at home when not at rehab on elliptical Long: Continue to improve stamina Short:  continue to exercise consistently Long:  improve overall stamina Short: complete HT program Long:  maintain fitness overall   Row Name 05/20/20 1121 06/04/20 1426           Exercise Goal Re-Evaluation   Exercise Goals Review Increase Physical Activity;Increase Strength and Stamina Increase Physical Activity;Increase Strength and Stamina;Understanding of Exercise Prescription      Comments Luis Wise is progressing well and has increased MET level and is up to 7 lb for strength training. Luis Wise improved his post 6MWT by 23%!!  He is set to continue to exercise on his own by walking.  He has worked up to level 6 on  the NuStep.  We will continue to monitor his progress.      Expected Outcomes Short: continue to increase workloads Long:  improve post 6 MWT Short: graduate Long: Continue to exercise independently             Discharge Exercise Prescription (Final Exercise Prescription Changes):  Exercise Prescription Changes - 06/04/20 1400      Response to Exercise   Blood Pressure (Admit) 118/64    Blood Pressure (Exercise) 162/80    Blood Pressure (Exit) 122/60    Heart Rate (Admit) 76 bpm    Heart Rate (Exercise) 96 bpm    Heart Rate (Exit) 85 bpm    Rating of Perceived Exertion (Exercise) 13    Symptoms none    Duration Continue with 30 min of aerobic exercise without signs/symptoms of physical distress.    Intensity THRR unchanged      Progression   Progression Continue to progress workloads to maintain intensity without signs/symptoms of physical distress.    Average METs 3.63      Resistance Training   Weight 7 lb    Reps 10-15      Interval Training   Interval Training No      Treadmill   MPH 3    Grade 2    Minutes 15    METs 4.12      Recumbant Bike   Level 4    Minutes 15    METs 4.2      NuStep   Level 6    Minutes 15    METs 4      Recumbant Elliptical   Level 2.5    Minutes 15    METs 2.2      Home Exercise Plan   Plans to continue exercise at Home (comment)   walking   Frequency Add 3 additional days to program exercise sessions.    Initial Home Exercises Provided 04/03/20           Nutrition:  Target Goals: Understanding of nutrition guidelines, daily intake of sodium <1544m, cholesterol <2074m calories 30% from fat and 7% or less from saturated fats, daily to have 5 or more servings of fruits and vegetables.  Education: All About Nutrition: -Group instruction provided by verbal, written material, interactive activities, discussions, models, and posters to present general guidelines  for heart healthy nutrition including fat, fiber, MyPlate, the  role of sodium in heart healthy nutrition, utilization of the nutrition label, and utilization of this knowledge for meal planning. Follow up email sent as well. Written material given at graduation. Flowsheet Row Cardiac Rehab from 04/24/2020 in [facility] Cardiac and Pulmonary Rehab  Education need identified 03/19/20  Date 03/21/20  Educator [place]  Instruction Review Code 1- Verbalizes Understanding      Biometrics:  Pre Biometrics - 03/19/20 1022      Pre Biometrics   Height 5' 10.4" (1.788 m)    Weight 162 lb 6.4 oz (73.7 kg)    BMI (Calculated) 23.04    Single Leg Stand 30 seconds            Nutrition Therapy Plan and Nutrition Goals:  Nutrition Therapy & Goals - 03/19/20 0947      Nutrition Therapy   Diet heart healthy, low Na    Protein (specify units) 60g    Fiber 30 grams    Whole Grain Foods 3 servings    Saturated Fats 12 max. 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 Down about 10 lbs, weight stable now. B: 1 egg with toast (whole wheat) or cereal (oatmeal - rolled oats) with brown sugar and walnuts. Decaf cooffee with almond milk sweetener. L: Luis Wise - grilled flounder with salad and vegetable or leftovers or lean cuisine "jazzed up" S: apple or peanuts or crackers with peanut butter D: light supper: favorite is shrimp salad, chicken, fish, brown rice, vegetables (broccoli, green beans, beans), bean soup. uses olive oil and canola oil. "50% salt" seasoning and mrs dash (low salt). Drinks:unsweet tea with slenda, fresca. Met with RD at [facility]. They are including more beans and are reading Dr. Maurine Minister book and cookbook, discussed his philosophy. Discussed how eating mostly whole plant foods can help with heart health and diabetes. Discussed heart healthy eaitng  and diabetes friendly eating.      Intervention Plan   Intervention Prescribe, educate and counsel regarding individualized specific dietary modifications aiming towards targeted core components such as weight, hypertension, lipid management, diabetes, heart failure and other comorbidities.;Nutrition handout(s) given to patient.    Expected Outcomes Short Term Goal: Understand basic principles of dietary content, such as calories, fat, sodium, cholesterol and nutrients.;Short Term Goal: A plan has been developed with personal nutrition goals set during dietitian appointment.;Long Term Goal: Adherence to prescribed nutrition plan.           Nutrition Assessments:  MEDIFICTS Score Key:  ?70 Need to make dietary changes   40-70 Heart Healthy Diet  ? 40 Therapeutic Level Cholesterol Diet  Flowsheet Row Cardiac Rehab from 06/07/2020 in [facility] Cardiac and Pulmonary Rehab  Picture Your Plate Total Score on Admission 81  Picture Your Plate Total Score on Discharge 80     Picture Your Plate Scores:  <31 Unhealthy dietary pattern with much room for improvement.  41-50 Dietary pattern unlikely to meet recommendations for good health and room for improvement.  51-60 More healthful dietary pattern, with some room for improvement.   >60 Healthy dietary pattern, although there may be some specific behaviors that could be improved.    Nutrition Goals Re-Evaluation:  Nutrition Goals Re-Evaluation  [place] Name 04/17/20 0932 05/06/20 0945           Goals   Nutrition Goal ST: eat more plant centered meals, try different meals in cookbooks LT: include 8 fruit and vegetable servings with variety --      Comment They have been eating beans with some meat in it as well as some salads to include more plants- he and his wife used to live in [place] so they are used to having beans. He will have grilled fish often. He reports not experimented just yet - but they have started to go through more cookbooks.  Shermaine and his wife are still using more beans and fish.  They havent found any new recipes.  They use whole grain rice, beans,etc.  His wife is an Therapist, sports so she takes good care of him      Expected Outcome ST: eat more plant centered meals, try different meals in cookbooks LT: include 8 fruit and vegetable servings with variety Short:  try more recipes Long:  increase fruit and vegetables             Nutrition Goals Discharge (Final Nutrition Goals Re-Evaluation):  Nutrition Goals Re-Evaluation - 05/06/20 0945      Goals   Comment Salathiel and his wife are still using more beans and fish.  They havent found any new recipes.  They use whole grain rice, beans,etc.  His wife is an Therapist, sports so she takes good care of him    Expected Outcome Short:  try more recipes Long:  increase fruit and vegetables           Psychosocial: Target Goals: Acknowledge presence or absence of significant depression and/or stress, maximize coping skills, provide positive support system. Participant is able to verbalize types and ability to use techniques and skills needed for reducing stress and depression.   Education: Stress, Anxiety, and Depression - Group verbal and visual presentation to define topics covered.  Reviews how body is impacted by stress, anxiety, and depression.  Also discusses healthy ways to reduce stress and to treat/manage anxiety and depression.  Written material given at graduation.   Education: Sleep Hygiene -Provides group verbal and written instruction about how sleep can affect your health.  Define sleep hygiene, discuss sleep cycles and impact of sleep habits. Review good sleep hygiene tips.    Initial Review & Psychosocial Screening:  Initial Psych Review & Screening - 03/18/20 1329      Initial Review   Current issues with None Identified      Family Dynamics   Good Support System? Yes    Comments He can look to his wife and his son that lives close by. Ramzey has a positive outlook on his  health since he has got a good report form the doctor.      Barriers   Psychosocial barriers to participate in program The patient should benefit from training in stress management and relaxation.;There are no identifiable barriers or psychosocial needs.      Screening Interventions   Interventions Encouraged to exercise;To provide support and resources with identified psychosocial needs;Provide feedback about the scores to participant    Expected Outcomes Short Term goal: Utilizing psychosocial counselor, staff and physician to assist with identification of specific Stressors or current issues interfering with healing process. Setting desired goal for each stressor or current issue identified.;Long Term Goal: Stressors or current issues are controlled or eliminated.;Short Term goal: Identification and review with participant of any Quality of  Life or Depression concerns found by scoring the questionnaire.;Long Term goal: The participant improves quality of Life and PHQ9 Scores as seen by post scores and/or verbalization of changes           Quality of Life Scores:   Quality of Life - 06/07/20 1109      Quality of Life Scores   Health/Function Pre 23.17 %    Health/Function Post 27.11 %    Health/Function % Change 17 %    Socioeconomic Pre 24 %    Socioeconomic Post 27.14 %    Socioeconomic % Change  13.08 %    Psych/Spiritual Pre 24 %    Psych/Spiritual Post 25.07 %    Psych/Spiritual % Change 4.46 %    Family Pre 28.8 %    Family Post 28.8 %    Family % Change 0 %    GLOBAL Pre 24.33 %    GLOBAL Post 26.9 %    GLOBAL % Change 10.56 %          Scores of 19 and below usually indicate a poorer quality of life in these areas.  A difference of  2-3 points is a clinically meaningful difference.  A difference of 2-3 points in the total score of the Quality of Life Index has been associated with significant improvement in overall quality of life, self-image, physical symptoms, and  general health in studies assessing change in quality of life.  PHQ-9: Recent Review Flowsheet Data    Depression screen [facility] 2/9 03/19/2020 03/11/2020 01/11/2020 06/13/2018 06/09/2017   Decreased Interest 0 0 0 0 0   Down, Depressed, Hopeless 0 0 0 0 0   PHQ - 2 Score 0 0 0 0 0   Altered sleeping 0 - - - -   Tired, decreased energy 0 - - - -   Change in appetite 0 - - - -   Feeling bad or failure about yourself  0 - - - -   Trouble concentrating 0 - - - -   Moving slowly or fidgety/restless 0 - - - -   Suicidal thoughts 0 - - - -   PHQ-9 Score 0 - - - -   Difficult doing work/chores Not difficult at all - - - -     Interpretation of Total Score  Total Score Depression Severity:  1-4 = Minimal depression, 5-9 = Mild depression, 10-14 = Moderate depression, 15-19 = Moderately severe depression, 20-27 = Severe depression   Psychosocial Evaluation and Intervention:  Psychosocial Evaluation - 03/18/20 1330      Psychosocial Evaluation & Interventions   Interventions Encouraged to exercise with the program and follow exercise prescription    Comments He can look to his wife and his son that lives close by. Kawon has a positive outlook on his health since he has got a good report form the doctor.    Expected Outcomes Short: Exercise regularly to support mental health and notify staff of any changes. Long: maintain mental health and well being through teaching of rehab or prescribed medications independently.    Continue Psychosocial Services  Follow up required by staff           Psychosocial Re-Evaluation:  Psychosocial Re-Evaluation    [place] Name 04/17/20 [phone] 05/06/20 0941           Psychosocial Re-Evaluation   Current issues with None Identified --      Comments He reports feeling great and feels extremely thankful. He reports his  wife is a great support system for him as well as his work friends and church community. Luis Wise feels positive overall - only worry is the war in [place].  he  still can rely on church and work friends for support.      Expected Outcomes ST/LT: continue to maintain positive attitude and support system Short:  continue to turn to support system for stress relief Long:  manage stress long term      Interventions Encouraged to attend Cardiac Rehabilitation for the exercise --      Continue Psychosocial Services  Follow up required by staff --             Psychosocial Discharge (Final Psychosocial Re-Evaluation):  Psychosocial Re-Evaluation - 05/06/20 0941      Psychosocial Re-Evaluation   Comments Luis Wise feels positive overall - only worry is the war in [place].  he still can rely on church and work friends for support.    Expected Outcomes Short:  continue to turn to support system for stress relief Long:  manage stress long term           Vocational Rehabilitation: Provide vocational rehab assistance to qualifying candidates.   Vocational Rehab Evaluation & Intervention:  Vocational Rehab - 06/07/20 1109      Initial Vocational Rehab Evaluation & Intervention   Assessment shows need for Vocational Rehabilitation No           Education: Education Goals: Education classes will be provided on a variety of topics geared toward better understanding of heart health and risk factor modification. Participant will state understanding/return demonstration of topics presented as noted by education test scores.  Learning Barriers/Preferences:  Learning Barriers/Preferences - 03/18/20 1328      Learning Barriers/Preferences   Learning Barriers None    Learning Preferences None           General Cardiac Education Topics:  AED/CPR: - Group verbal and written instruction with the use of models to demonstrate the basic use of the AED with the basic ABC's of resuscitation.   Anatomy and Cardiac Procedures: - Group verbal and visual presentation and models provide information about basic cardiac anatomy and function. Reviews the testing  methods done to diagnose heart disease and the outcomes of the test results. Describes the treatment choices: Medical Management, Angioplasty, or Coronary Bypass Surgery for treating various heart conditions including Myocardial Infarction, Angina, Valve Disease, and Cardiac Arrhythmias.  Written material given at graduation. Flowsheet Row Cardiac Rehab from 04/24/2020 in [facility] Cardiac and Pulmonary Rehab  Education need identified 03/19/20      Medication Safety: - Group verbal and visual instruction to review commonly prescribed medications for heart and lung disease. Reviews the medication, class of the drug, and side effects. Includes the steps to properly store meds and maintain the prescription regimen.  Written material given at graduation. Flowsheet Row Cardiac Rehab from 04/24/2020 in [facility] Cardiac and Pulmonary Rehab  Date 03/27/20  Educator SB  Instruction Review Code 1- Verbalizes Understanding      Intimacy: - Group verbal instruction through game format to discuss how heart and lung disease can affect sexual intimacy. Written material given at graduation.. Flowsheet Row Cardiac Rehab from 04/24/2020 in [facility] Cardiac and Pulmonary Rehab  Date 04/24/20  Educator jh  Instruction Review Code 1- Verbalizes Understanding      Know Your Numbers and Heart Failure: - Group verbal and visual instruction to discuss disease risk factors for cardiac and pulmonary disease and treatment options.  Reviews associated critical values for Overweight/Obesity, Hypertension, Cholesterol, and Diabetes.  Discusses basics of heart failure: signs/symptoms and treatments.  Introduces Heart Failure Zone chart for action plan for heart failure.  Written material given at graduation.   Infection Prevention: - Provides verbal and written material to individual with discussion of infection control including proper hand washing and proper equipment cleaning during exercise session. Flowsheet Row Cardiac Rehab  from 04/24/2020 in [facility] Cardiac and Pulmonary Rehab  Date 03/18/20  Educator [facility]  Instruction Review Code 1- Verbalizes Understanding      Falls Prevention: - Provides verbal and written material to individual with discussion of falls prevention and safety. Flowsheet Row Cardiac Rehab from 04/24/2020 in [facility] Cardiac and Pulmonary Rehab  Date 03/18/20  Educator [facility]  Instruction Review Code 1- Verbalizes Understanding      Other: -Provides group and verbal instruction on various topics (see comments)   Knowledge Questionnaire Score:  Knowledge Questionnaire Score - 06/07/20 1109      Knowledge Questionnaire Score   Pre Score 24/26 Education Focus: MI, nutrition    Post Score 24/26           Core Components/Risk Factors/Patient Goals at Admission:  Personal Goals and Risk Factors at Admission - 03/19/20 1029      Core Components/Risk Factors/Patient Goals on Admission    Weight Management Yes;Weight Maintenance    Intervention Weight Management: Develop a combined nutrition and exercise program designed to reach desired caloric intake, while maintaining appropriate intake of nutrient and fiber, sodium and fats, and appropriate energy expenditure required for the weight goal.;Weight Management: Provide education and appropriate resources to help participant work on and attain dietary goals.;Weight Management/Obesity: Establish reasonable short term and long term weight goals.    Admit Weight 162 lb 6.4 oz (73.7 kg)    Goal Weight: Short Term 164 lb (74.4 kg)    Goal Weight: Long Term 164 lb (74.4 kg)    Expected Outcomes Short Term: Continue to assess and modify interventions until short term weight is achieved;Long Term: Adherence to nutrition and physical activity/exercise program aimed toward attainment of established weight goal;Weight Maintenance: Understanding of the daily nutrition guidelines, which includes 25-35% calories from fat, 7% or less cal from saturated fats, less  than 293m cholesterol, less than 1.5gm of sodium, & 5 or more servings of fruits and vegetables daily    Diabetes Yes    Intervention Provide education about signs/symptoms and action to take for hypo/hyperglycemia.;Provide education about proper nutrition, including hydration, and aerobic/resistive exercise prescription along with prescribed medications to achieve blood glucose in normal ranges: Fasting glucose 65-99 mg/dL    Expected Outcomes Short Term: Participant verbalizes understanding of the signs/symptoms and immediate care of hyper/hypoglycemia, proper foot care and importance of medication, aerobic/resistive exercise and nutrition plan for blood glucose control.;Long Term: Attainment of HbA1C < 7%.    Hypertension Yes    Intervention Provide education on lifestyle modifcations including regular physical activity/exercise, weight management, moderate sodium restriction and increased consumption of fresh fruit, vegetables, and low fat dairy, alcohol moderation, and smoking cessation.;Monitor prescription use compliance.    Expected Outcomes Short Term: Continued assessment and intervention until BP is < 140/963mHG in hypertensive participants. < 130/8042mG in hypertensive participants with diabetes, heart failure or chronic kidney disease.;Long Term: Maintenance of blood pressure at goal levels.    Lipids Yes    Intervention Provide education and support for participant on nutrition & aerobic/resistive exercise along with prescribed medications to achieve LDL <  30m, HDL >438m    Expected Outcomes Short Term: Participant states understanding of desired cholesterol values and is compliant with medications prescribed. Participant is following exercise prescription and nutrition guidelines.;Long Term: Cholesterol controlled with medications as prescribed, with individualized exercise RX and with personalized nutrition plan. Value goals: LDL < 7067mHDL > 40 mg.           Education:Diabetes -  Individual verbal and written instruction to review signs/symptoms of diabetes, desired ranges of glucose level fasting, after meals and with exercise. Acknowledge that pre and post exercise glucose checks will be done for 3 sessions at entry of program. [facility]  Date 03/18/20  Educator [facility] Review Code 1- Verbalizes Understanding      Core Components/Risk Factors/Patient Goals Review:   Goals and Risk Factor Review    Row Name 04/17/20 0934 05/06/20 0938           Core Components/Risk Factors/Patient Goals Review   Personal Goals Review Hypertension Hypertension;Tobacco Cessation      Review He reports taking his BP at home, but not often as it has been doing well - his wife is a nurMarine scientistncouraged to take BP consistently at home especially after rehab. He reports taking his medication as prescribed. Yahsir has not been checking BP at home.  Today at HT [facility] was 118/62 resting.  He reports taking all medication as directed.      Expected Outcomes ST: consistently take BP at home LT: Continue to manage risk factors Short: monitor BP consistently whether at home or HT Long: manage risk factors long term             Core Components/Risk Factors/Patient Goals at Discharge (Final Review):   Goals and Risk Factor Review - 05/06/20 0938      Core Components/Risk Factors/Patient Goals Review   Personal Goals Review Hypertension;Tobacco Cessation    Review Luis Wise not been checking BP at home.  Today at HT [facility] was 118/62 resting.  He reports taking all medication as directed.    Expected Outcomes Short: monitor BP consistently whether at home or HT Long: manage risk factors long term           ITP Comments:  ITP Comments    Row Name 03/18/20 1341 03/19/20 1017 03/22/20 0926 04/03/20 0906 05/01/20 0631   ITP Comments Virtual Visit completed. Patient informed on EP and RD appointment and 6 Minute walk test.  Patient also informed of patient health questionnaires on My Chart. Patient Verbalizes understanding. [facility]. Completed 6MWT and gym orientation. Initial ITP created and sent for review to Dr. MarEmily Filbertedical Director. First full day of exercise!  Patient was oriented to gym and equipment including functions, settings, policies, and procedures.  Patient's individual exercise prescription and treatment plan were reviewed.  All starting workloads were established based on the results of the 6 minute walk test done at initial orientation visit.  The plan for exercise progression was also introduced and progression will be customized based on patient's performance and goals. 30 Day review completed. Medical Director ITP review done, changes made as directed, and signed approval by Medical Director.  new to program 30 Day review completed. Medical Director ITP review done, changes made as directed, and signed approval by Medical Director.   [facility] [place] 05/29/20 0736 06/07/20 1010         ITP Comments 30  Day review completed. Medical Director ITP review done, changes made as directed, and signed approval by Medical Director. Luis Wise graduated today from  rehab with 34 sessions completed.  Details of the patient's exercise prescription and what He needs to do in order to continue the prescription and progress were discussed with patient.  Patient was given a copy of prescription and goals.  Patient verbalized understanding.  Luis Wise plans to continue to exercise by going back to exercising at the park.             Comments: discharge

## 2020-06-07 NOTE — Progress Notes (Signed)
Discharge Progress Report  Patient Details  Name: Luis Wise MRN: [id number] Date of Birth: 1940/10/21 Referring Provider:   Flowsheet Row Cardiac Rehab from 03/19/2020 in [facility] Cardiac and Pulmonary Rehab  Referring Provider [name] MD       Number of Visits: 73  Reason for Discharge:  Patient reached a stable level of exercise. Patient independent in their exercise.  Diagnosis:  S/P CABG x 5   Initial Exercise Prescription:  Initial Exercise Prescription - 03/19/20 1000      Date of Initial Exercise RX and Referring Provider   Date 03/19/20    Referring Provider [name] MD      Treadmill   MPH 2.7    Grade 1    Minutes 15    METs 3.44      Recumbant Bike   Level 2    RPM 50    Watts 27    Minutes 15    METs 3.1      NuStep   Level 3    SPM 80    Minutes 15    METs 3.1      Recumbant Elliptical   Level 2    RPM 50    Minutes 15    METs 3      Elliptical   Level 1    Speed 2.9    Minutes 15      Prescription Details   Frequency (times per week) 3    Duration Progress to 30 minutes of continuous aerobic without signs/symptoms of physical distress      Intensity   THRR 40-80% of Max Heartrate 102-129    Ratings of Perceived Exertion 11-13    Perceived Dyspnea 0-4      Progression   Progression Continue to progress workloads to maintain intensity without signs/symptoms of physical distress.      Resistance Training   Weight 4 lb    Reps 10-15           Discharge Exercise Prescription (Final Exercise Prescription Changes):  Exercise Prescription Changes - 06/04/20 1400      Response to Exercise   Blood Pressure (Admit) 118/64    Blood Pressure (Exercise) 162/80    Blood Pressure (Exit) 122/60    Heart Rate (Admit) 76 bpm    Heart Rate (Exercise) 96 bpm    Heart Rate (Exit) 85 bpm    Rating of Perceived Exertion (Exercise) 13    Symptoms none    Duration Continue with 30 min of aerobic exercise without  signs/symptoms of physical distress.    Intensity THRR unchanged      Progression   Progression Continue to progress workloads to maintain intensity without signs/symptoms of physical distress.    Average METs 3.63      Resistance Training   Weight 7 lb    Reps 10-15      Interval Training   Interval Training No      Treadmill   MPH 3    Grade 2    Minutes 15    METs 4.12      Recumbant Bike   Level 4    Minutes 15    METs 4.2      NuStep   Level 6    Minutes 15    METs 4      Recumbant Elliptical   Level 2.5    Minutes 15    METs 2.2      Home Exercise Plan   Plans  to continue exercise at Home (comment)   walking   Frequency Add 3 additional days to program exercise sessions.    Initial Home Exercises Provided 04/03/20           Functional Capacity:  6 Minute Walk    Row Name 03/19/20 1018 05/22/20 1113       6 Minute Walk   Phase Initial Discharge    Distance 1540 feet 1895 feet    Distance % Change -- 23 %    Walk Time 6 minutes 6 minutes    # of Rest Breaks 0 0    MPH 2.92 3.6    METS 3.17 3.9    RPE 11 11    VO2 Peak 11.1 13.64    Symptoms No No    Resting HR 75 bpm 76 bpm    Resting BP 124/74 130/60    Resting Oxygen Saturation  99 % --    Exercise Oxygen Saturation  during 6 min walk 99 % --    Max Ex. HR 86 bpm 92 bpm    Max Ex. BP 136/60 150/60    2 Minute Post BP 126/58 --             Nutrition & Weight - Outcomes:  Pre Biometrics - 03/19/20 1022      Pre Biometrics   Height 5' 10.4" (1.788 m)    Weight 162 lb 6.4 oz (73.7 kg)    BMI (Calculated) 23.04    Single Leg Stand 30 seconds          Post weight 163 lb Nutrition:  Nutrition Therapy & Goals - 03/19/20 0947      Nutrition Therapy   Diet heart healthy, low Na    Protein (specify units) 60g    Fiber 30 grams    Whole Grain Foods 3 servings    Saturated Fats 12 max. grams    Fruits and Vegetables 8 servings/day    Sodium 1.5 grams      Personal Nutrition  Goals   Nutrition Goal ST: eat more plant centered meals LT: include 8 fruit and vegetable servings with variety    Comments A1C: 6.9 - metformin, farciga. Down about 10 lbs, weight stable now. B: 1 egg with toast (whole wheat) or cereal (oatmeal - rolled oats) with brown sugar and walnuts. Decaf cooffee with almond milk sweetener. L: Warden/ranger - grilled flounder with salad and vegetable or leftovers or lean cuisine "jazzed up" S: apple or peanuts or crackers with peanut butter D: light supper: favorite is shrimp salad, chicken, fish, brown rice, vegetables (broccoli, green beans, beans), bean soup. uses olive oil and canola oil. "50% salt" seasoning and mrs dash (low salt). Drinks:unsweet tea with slenda, fresca. Met with RD at [facility]. They are including more beans and are reading Dr. Maurine Minister book and cookbook, discussed his philosophy. Discussed how eating mostly whole plant foods can help with heart health and diabetes. Discussed heart healthy eaitng and diabetes friendly eating.      Intervention Plan   Intervention Prescribe, educate and counsel regarding individualized specific dietary modifications aiming towards targeted core components such as weight, hypertension, lipid management, diabetes, heart failure and other comorbidities.;Nutrition handout(s) given to patient.    Expected Outcomes Short Term Goal: Understand basic principles of dietary content, such as calories, fat, sodium, cholesterol and nutrients.;Short Term Goal: A plan has been developed with personal nutrition goals set during dietitian appointment.;Long Term Goal: Adherence to prescribed nutrition plan.  Goals reviewed with patient; copy given to patient.

## 2020-06-07 NOTE — Telephone Encounter (Signed)
Noted. Thanks.

## 2020-06-14 DIAGNOSIS — Z23 Encounter for immunization: Secondary | ICD-10-CM | POA: Diagnosis not present

## 2020-06-20 NOTE — Progress Notes (Signed)
Cardiology Office Note  Date:  06/21/2020   ID:  Luis Wise, DOB 1940/06/28, MRN [id number]  PCP:  Joaquim Nam, MD   Chief Complaint  Patient presents with  . 3 month follow up     "doing well." Medications reviewed by the patient verbally.     HPI:  Mr. Skip Litke is a 80 year old gentleman with past medical history of Diabetes type 2 Hypertension Hyperlipidemia Prior emergency room for chest pain Cardiac CTA with three-vessel disease Cath: Chronic occlusion of the proximal to mid LAD, CABG Who presents for f/u of his CAD and angina/chest pain  License pilot  Seen in the emergency room December 29, 2019 for chest pain Unstable angina symptoms  Cardiac CTA with three-vessel disease  Last seen in clinic November 2021 Had three-vessel coronary disease on catheterization January 19, 2020 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 converted to normal sinus rhythm   Cardiac rehab,  Walking 3 miles a day Denies any anginal symptoms on exertion No side effects from medications  Lab work reviewed A1C: 6.9 Total chol 122, LDL 66  EKG personally reviewed by myself on todays visit Normal sinus rhythm rate 61 bpm left bundle branch block  Other past medical history reviewed Licensed pilot,  PMH:   has a past medical history of Allergy, Cataract (2017), Coronary artery disease, Diabetes mellitus with complication (HCC), Hyperlipidemia, Hypertension, Insomnia, neuropathy, and Postoperative atrial fibrillation (HCC).  PSH:    Past Surgical History:  Procedure Laterality Date  . BACK SURGERY    . CARDIAC CATHETERIZATION     01/19/2020  . CORONARY ARTERY BYPASS GRAFT N/A 01/29/2020   Procedure:  CORONARY ARTERY BYPASS GRAFTING (CABG) TIMES FIVE USING LIMA to LAD; ENDOSCOPIC HARVESTED GREATER SAPHENOUS VEIN: SVG to PDA; SVG to sequenced OM1 & OM2.;  Surgeon: Kerin Perna, MD;  Location: [facility] OR;  Service: Open Heart Surgery;  Laterality: N/A;  . ENDOVEIN HARVEST OF GREATER SAPHENOUS VEIN Bilateral 01/29/2020   Procedure: ENDOVEIN HARVEST OF GREATER SAPHENOUS VEIN;  Surgeon: Kerin Perna, MD;  Location: [facility] OR;  Service: Open Heart Surgery;  Laterality: Bilateral;  . EYE SURGERY  Radial K   1997  . LAMINECTOMY  1993   L5-S1  . LEFT HEART CATH AND CORONARY ANGIOGRAPHY N/A 01/19/2020   Procedure: LEFT HEART CATH AND CORONARY ANGIOGRAPHY;  Surgeon: Iran Ouch, MD;  Location: [facility] INVASIVE CV LAB;  Service: Cardiovascular;  Laterality: N/A;  . MENISCUS REPAIR  1996  . TEE WITHOUT CARDIOVERSION N/A 01/29/2020   Procedure: TRANSESOPHAGEAL ECHOCARDIOGRAM (TEE);  Surgeon: Donata Clay, Theron Arista, MD;  Location[facility];  Service: Open Heart Surgery;  Laterality: N/A;  . TONSILLECTOMY      Current Outpatient Medications  Medication Sig Dispense Refill  . amiodarone (PACERONE) 200 MG tablet Take 200 mg by mouth.    Marland Kitchen amLODipine (NORVASC) 10 MG tablet Take 1 tablet (10 mg total) by mouth daily. 90 tablet 1  . aspirin EC 81 MG tablet Take 1 tablet (81 mg total) by mouth daily. Swallow whole. 150 tablet 2  . atorvastatin (LIPITOR) 80 MG tablet Take 1 tablet (80 mg total) by mouth daily. 90 tablet 3  . carvedilol (COREG) 12.5 MG tablet Take 1 tablet (12.5 mg total) by mouth 2 (two)  times daily. 180 tablet 3  . cholecalciferol (VITAMIN D3) 25 MCG (1000 UNIT) tablet Take 1,000 Units by mouth daily.    . Coenzyme Q10 (COQ10) 100 MG CAPS Take 100 mg by mouth daily.     . dapagliflozin propanediol (FARXIGA) 5 MG TABS tablet Take 1 tablet (5 mg total) by mouth daily before breakfast. 30 tablet 11  . fenofibrate 160 MG tablet Take 1 tablet (160 mg total) by mouth daily. 90 tablet 1  . ferrous sulfate  324 (65 Fe) MG TBEC Take 1 tablet (325 mg total) by mouth every Monday, Wednesday, and Friday.    Marland Kitchen glimepiride (AMARYL) 4 MG tablet TAKE 1 TABLET BY MOUTH EVERY DAY WITH BREAKFAST 90 tablet 1  . glucose blood (ONETOUCH ULTRA) test strip TEST 1-2x a day 200 strip 3  . lisinopril (ZESTRIL) 20 MG tablet TAKE 1 TABLET BY MOUTH EVERY DAY 90 tablet 1  . lisinopril-hydrochlorothiazide (ZESTORETIC) 20-25 MG tablet Take 1 tablet by mouth daily. 90 tablet 1  . metFORMIN (GLUCOPHAGE) 1000 MG tablet 1500mg  in the PM    . Misc Natural Products (URINOZINC PO) Take 1 tablet by mouth in the morning and at bedtime.     . Multiple Vitamins-Minerals (PRESERVISION AREDS 2 PO) Take 1 tablet by mouth 2 (two) times daily.     . sildenafil (REVATIO) 20 MG tablet Take 20 mg by mouth as needed.    . traZODone (DESYREL) 50 MG tablet TAKE 1 TO 1.5 TABLETS BY MOUTH EVERY NIGHT FOR SLEEP 135 tablet 3  . ZINC OXIDE PO Take by mouth.     No current facility-administered medications for this visit.     Allergies:   Ozempic (0.25 or 0.5 mg-dose) [semaglutide(0.25 or 0.5mg -dos)] and Amoxil [amoxicillin]   Social History:  The patient  reports that he has never smoked. He has never used smokeless tobacco. He reports previous alcohol use. He reports that he does not use drugs.   Family History:   family history includes Diabetes in his father; Hypertension in his mother.    Review of Systems: Review of Systems  Constitutional: Negative.   HENT: Negative.   Respiratory: Negative.   Cardiovascular: Negative.   Gastrointestinal: Negative.   Musculoskeletal: Negative.   Neurological: Negative.   Psychiatric/Behavioral: Negative.   All other systems reviewed and are negative.  PHYSICAL EXAM: VS:  BP 118/60 (BP Location: Left Arm, Patient Position: Sitting, Cuff Size: Normal)   Pulse 61   Ht 5' 9.5" (1.765 m)   Wt 168 lb (76.2 kg)   SpO2 98%   BMI 24.45 kg/m  , BMI Body mass index is 24.45 kg/m. Constitutional:   oriented to person, place, and time. No distress.  HENT:  Head: Grossly normal Eyes:  no discharge. No scleral icterus.  Neck: No JVD, no carotid bruits  Cardiovascular: Regular rate and rhythm, no murmurs appreciated Pulmonary/Chest: Clear to auscultation bilaterally, no wheezes or rails Abdominal: Soft.  no distension.  no tenderness.  Musculoskeletal: Normal range of motion Neurological:  normal muscle tone. Coordination normal. No atrophy Skin: Skin warm and dry Psychiatric: normal affect, pleasant  Recent Labs: 01/30/2020: Magnesium 2.3 02/27/2020: Hemoglobin 12.9; Platelets 263.0 04/11/2020: ALT 39 05/10/2020: BUN 29; Creatinine, Ser 1.29; Potassium 3.7; Sodium 135    Lipid Panel Lab Results  Component Value Date   CHOL 122 04/11/2020   HDL 31 (L) 04/11/2020   LDLCALC 66 04/11/2020   TRIG 127 04/11/2020    Wt Readings from Last 3 Encounters:  06/21/20 168 lb (76.2 kg)  05/10/20 168 lb (76.2 kg)  04/22/20 167 lb 3.2 oz (75.8 kg)     ASSESSMENT AND PLAN:  Problem List Items Addressed This Visit      Cardiology Problems   Atrial fibrillation (HCC)   Relevant Medications   amiodarone (PACERONE) 200 MG tablet     Other   S/P CABG x 5   Anemia    Other Visit Diagnoses    Coronary artery disease of native artery of native heart with stable angina pectoris (HCC)    -  Primary   Relevant Medications   amiodarone (PACERONE) 200 MG tablet   Other Relevant Orders   EKG 12-Lead   Type 2 diabetes mellitus without complication, without long-term current use of insulin (HCC)       Relevant Orders   EKG 12-Lead   Hyperlipidemia LDL goal <70       Relevant Medications   amiodarone (PACERONE) 200 MG tablet   Essential hypertension       Relevant Medications   amiodarone (PACERONE) 200 MG tablet     CAD with stable angina CABG x5, November 2021 Continue aspirin, statin, beta-blocker, ACE Completed cardiac rehab Walking 3 miles a day, no anginal  symptoms  Hyperlipidemia Tolerating Lipitor 80 daily Cholesterol is at goal on the current lipid regimen. No changes to the medications were made.  Essential hypertension Blood pressure is well controlled on today's visit. No changes made to the medications.  Diabetes type 2 with complications Followed by endocrinology A1c around 7 Recommend continue lifestyle modification    Total encounter time more than 25 minutes  Greater than 50% was spent in counseling and coordination of care with the patient    Signed, Dossie Arbour, M.D., Ph.D. [facility] [place], [place] [phone]

## 2020-06-21 ENCOUNTER — Other Ambulatory Visit: Payer: Self-pay

## 2020-06-21 ENCOUNTER — Encounter: Payer: Self-pay | Admitting: Cardiovascular Disease

## 2020-06-21 ENCOUNTER — Ambulatory Visit (INDEPENDENT_AMBULATORY_CARE_PROVIDER_SITE_OTHER): Payer: Medicare Other | Admitting: Cardiovascular Disease

## 2020-06-21 VITALS — BP 118/60 | HR 61 | Ht 69.5 in | Wt 168.0 lb

## 2020-06-21 DIAGNOSIS — Z951 Presence of aortocoronary bypass graft: Secondary | ICD-10-CM | POA: Diagnosis not present

## 2020-06-21 DIAGNOSIS — I10 Essential (primary) hypertension: Secondary | ICD-10-CM | POA: Diagnosis not present

## 2020-06-21 DIAGNOSIS — D649 Anemia, unspecified: Secondary | ICD-10-CM

## 2020-06-21 DIAGNOSIS — E119 Type 2 diabetes mellitus without complications: Secondary | ICD-10-CM | POA: Diagnosis not present

## 2020-06-21 DIAGNOSIS — I4819 Other persistent atrial fibrillation: Secondary | ICD-10-CM

## 2020-06-21 DIAGNOSIS — I2510 Atherosclerotic heart disease of native coronary artery without angina pectoris: Secondary | ICD-10-CM

## 2020-06-21 DIAGNOSIS — I25118 Atherosclerotic heart disease of native coronary artery with other forms of angina pectoris: Secondary | ICD-10-CM | POA: Diagnosis not present

## 2020-06-21 DIAGNOSIS — E785 Hyperlipidemia, unspecified: Secondary | ICD-10-CM | POA: Diagnosis not present

## 2020-06-21 MED ORDER — ATORVASTATIN CALCIUM 80 MG PO TABS: 80.0000 mg | ORAL_TABLET | Freq: Every day | ORAL | 3 refills | Status: DC

## 2020-06-21 MED ORDER — LISINOPRIL-HYDROCHLOROTHIAZIDE 20-25 MG PO TABS: 1.0000 | ORAL_TABLET | Freq: Every day | ORAL | 3 refills | Status: DC

## 2020-06-21 MED ORDER — LISINOPRIL 20 MG PO TABS: 1.0000 | ORAL_TABLET | Freq: Every day | ORAL | 3 refills | Status: DC

## 2020-06-21 MED ORDER — CARVEDILOL 12.5 MG PO TABS: 12.5000 mg | ORAL_TABLET | Freq: Two times a day (BID) | ORAL | 3 refills | Status: DC

## 2020-06-21 MED ORDER — AMLODIPINE BESYLATE 10 MG PO TABS: 10.0000 mg | ORAL_TABLET | Freq: Every day | ORAL | 3 refills | Status: DC

## 2020-06-21 MED ORDER — AMIODARONE HCL 200 MG PO TABS: 200.0000 mg | ORAL_TABLET | Freq: Every day | ORAL | 3 refills | Status: DC | PRN

## 2020-06-21 NOTE — Patient Instructions (Signed)
Medication Instructions:  Hold the amiodarone Take only as needed for atrial fib spells  If you need a refill on your cardiac medications before your next appointment, please call your pharmacy.    Lab work: No new labs needed   If you have labs (blood work) drawn today and your tests are completely normal, you will receive your results only by: Marland Kitchen MyChart Message (if you have MyChart) OR . A paper copy in the mail If you have any lab test that is abnormal or we need to change your treatment, we will call you to review the results.   Testing/Procedures: No new testing needed   Follow-Up: At [facility], you and your health needs are our priority.  As part of our continuing mission to provide you with exceptional heart care, we have created designated Provider Care Teams.  These Care Teams include your primary Cardiologist (physician) and Advanced Practice Providers (APPs -  Physician Assistants and Nurse Practitioners) who all work together to provide you with the care you need, when you need it.  . You will need a follow up appointment in 12 months  . Providers on your designated Care Team:   . Nicolasa Ducking, NP . Eula Listen, PA-C . Marisue Ivan, PA-C  Any Other Special Instructions Will Be Listed Below (If Applicable).  COVID-19 Vaccine Information can be found at: [email or web address] For questions related to vaccine distribution or appointments, please email [email or web address] or call [phone].

## 2020-07-01 ENCOUNTER — Ambulatory Visit (INDEPENDENT_AMBULATORY_CARE_PROVIDER_SITE_OTHER): Payer: Medicare Other | Admitting: Internal Medicine

## 2020-07-01 ENCOUNTER — Encounter: Payer: Self-pay | Admitting: Internal Medicine

## 2020-07-01 ENCOUNTER — Other Ambulatory Visit: Payer: Self-pay

## 2020-07-01 VITALS — BP 140/88 | HR 65 | Ht 69.5 in | Wt 167.8 lb

## 2020-07-01 DIAGNOSIS — E1159 Type 2 diabetes mellitus with other circulatory complications: Secondary | ICD-10-CM | POA: Diagnosis not present

## 2020-07-01 DIAGNOSIS — E1165 Type 2 diabetes mellitus with hyperglycemia: Secondary | ICD-10-CM

## 2020-07-01 DIAGNOSIS — I2510 Atherosclerotic heart disease of native coronary artery without angina pectoris: Secondary | ICD-10-CM | POA: Diagnosis not present

## 2020-07-01 DIAGNOSIS — E785 Hyperlipidemia, unspecified: Secondary | ICD-10-CM | POA: Diagnosis not present

## 2020-07-01 DIAGNOSIS — E119 Type 2 diabetes mellitus without complications: Secondary | ICD-10-CM | POA: Diagnosis not present

## 2020-07-01 LAB — POCT GLYCOSYLATED HEMOGLOBIN (HGB A1C): Hemoglobin A1C: 8.4 % — AB (ref 4.0–5.6)

## 2020-07-01 MED ORDER — TRESIBA FLEXTOUCH 200 UNIT/ML ~~LOC~~ SOPN: 10.0000 [IU] | PEN_INJECTOR | Freq: Every day | SUBCUTANEOUS | 3 refills | Status: DC

## 2020-07-01 MED ORDER — INSULIN PEN NEEDLE 32G X 4 MM MISC: 3 refills | Status: DC

## 2020-07-01 NOTE — Patient Instructions (Signed)
Please continue: - Metformin 1500 mg with dinner - Glimepiride 4 mg before breakfast - Farxiga 5 mg before breakfast  Add: - Tresiba U200 10 units daily Increase the dose by 2 units every 3-4 days until am sugars are <140.  Please return in 1.5 months with your sugar log.

## 2020-07-01 NOTE — Progress Notes (Signed)
Patient ID: Luis Wise, male   DOB: 01/15/1941, 80 y.o.   MRN: [id number]   This visit occurred during the SARS-CoV-2 public health emergency.  Safety protocols were in place, including screening questions prior to the visit, additional usage of staff PPE, and extensive cleaning of exam room while observing appropriate contact time as indicated for disinfecting solutions.   HPI: Luis Wise is a 80 y.o.-year-old male, initially referred by his PCP, Dr. Para March, returning for follow-up: DM2, dx in 2004-2005, non-insulin-dependent, uncontrolled, with complications (CAD - s/p CABG x5, CKD).  He is here with his wife, who is a former Nurse, learning disability and offers part of the history especially related to medications, blood sugars, and diet.  Last visit 2.5 months ago.  Interim history: Patient's diabetes was better controlled before his CABG on 01/29/2020, after which his sugars increased to 200s.  However, since then, sugars started to improve especially after adding [place]. He describes decreased energy with walking, but w/o SOB or CP. No increased urination, blurry vision.  Reviewed HbA1c levels: Lab Results  Component Value Date   HGBA1C 6.9 (A) 03/11/2020   HGBA1C 7.0 (H) 01/26/2020   HGBA1C 7.0 (H) 01/02/2020   HGBA1C 6.7 (A) 07/18/2019   HGBA1C 6.5 (A) 12/30/2018   HGBA1C 7.0 05/19/2018   HGBA1C 6.8 01/06/2018   HGBA1C 6.8 06/09/2017   HGBA1C 6.9 12/29/2016   HGBA1C 6.5 09/14/2016   Pt is on a regimen of: - Metformin 500 mg in am and 1000 mg in pm >> 1500 mg at dinnertime - Glimepiride 4-6 mg before breakfast - Farxiga 5 mg before breakfast-added 03/2020 She was also tried on Rybelsus but this was expensive.  He then tried Ozempic but he developed nausea and vomiting and then increase lipase on 02/27/2020 (82  >> 119) on the 0.5 mg dose.  Ozempic was stopped at that time. Prev. On Januvia >> tolerated well but expensive.  Pt checks his sugars once a day: - am: before Sx: 160; after  Sx: 120-222 >> 138-177 but 190s if eats supper late >> 129, 153-215, 235 - 2h after b'fast: n/c >> 183-251 - before lunch: n/c >> 157, 271 >> 182-219 - 2h after lunch: n/c - before dinner: before Sx: 100; after Sx: 76-163 >> 143, 144, 183 (orange) >> 173-239, 347 (milkshake) - 2h after dinner: n/c >> 166, 181 >> n/c - bedtime: n/c >> 161 >> 189, 211 - nighttime: n/c >> 135 >> n/c Lowest sugar was 50s before sx >> 129; he has hypoglycemia awareness in the 60s.  Highest sugar was 265 >> 347 (milkshake).  Glucometer: OneTouch ultra  Pt's meals are: - Breakfast: 1 egg + toast or cereal or oatmeal - Lunch: grilled fish + veggies - Dinner: meat + veggies - Snacks: 2: nuts, wheat crackers He walks 3 miles 3 days a week for exercise.  He finished cardiac rehab.   -No CKD, last BUN/creatinine:  Lab Results  Component Value Date   BUN 29 (H) 05/10/2020   BUN 22 03/06/2020   CREATININE 1.29 05/10/2020   CREATININE 1.25 03/06/2020  On lisinopril 20 twice a day.  -+ HL; last set of lipids: Lab Results  Component Value Date   CHOL 122 04/11/2020   HDL 31 (L) 04/11/2020   LDLCALC 66 04/11/2020   LDLDIRECT 81.0 01/02/2020   TRIG 127 04/11/2020   CHOLHDL 3.9 04/11/2020  On atorvastatin 80 << pravastatin 20 mg daily, fenofibrate 160, co-Q10  On ASA 81.  - last  eye exam was in 10/2019: No DR  - no numbness and tingling in his feet.  Pt has FH of DM in father in his 1980s.  He is also on amiodarone.  ROS: Constitutional: no weight gain/no weight loss, no fatigue, no subjective hyperthermia, no subjective hypothermia, + nocturia  Eyes: no blurry vision, no xerophthalmia ENT: no sore throat, no nodules palpated in neck, no dysphagia, no odynophagia, no hoarseness Cardiovascular: no CP/no SOB/no palpitations/no leg swelling Respiratory: no cough/no SOB/no wheezing Gastrointestinal: no N/no V/no D/no C/no acid reflux Musculoskeletal: no muscle aches/no joint aches Skin: no rashes, no  hair loss Neurological: no tremors/no numbness/no tingling/no dizziness  I reviewed pt's medications, allergies, PMH, social hx, family hx, and changes were documented in the history of present illness. Otherwise, unchanged from my initial visit note.   Past Medical History:  Diagnosis Date  . Allergy    Amoxicillin  . Cataract 2017   Mild progression  . Coronary artery disease    a. s/p 5-vessel CABG (LIMA-LAD, SVG-diag, sequential SVG-OM1-OM 2, & SVG-PDA)  . Diabetes mellitus with complication (HCC)   . Hyperlipidemia   . Hypertension   . Insomnia   . neuropathy    bilateral feet  . Postoperative atrial fibrillation [facility](HCC)    Past Surgical History:  Procedure Laterality Date  . BACK SURGERY    . CARDIAC CATHETERIZATION     01/19/2020  . CORONARY ARTERY BYPASS GRAFT N/A 01/29/2020   Procedure: CORONARY ARTERY BYPASS GRAFTING (CABG) TIMES FIVE USING LIMA to LAD; ENDOSCOPIC HARVESTED GREATER SAPHENOUS VEIN: SVG to PDA; SVG to sequenced OM1 & OM2.;  Surgeon: Kerin PernaVan Trigt, Peter, MD;  [facility];  Service: Open Heart Surgery;  Laterality: N/A;  . ENDOVEIN HARVEST OF GREATER SAPHENOUS VEIN Bilateral 01/29/2020   Procedure: ENDOVEIN HARVEST OF GREATER SAPHENOUS VEIN;  Surgeon: Kerin PernaVan Trigt, Peter, MD;  [facility];  Service: Open Heart Surgery;  Laterality: Bilateral;  . EYE SURGERY  Radial K   1997  . LAMINECTOMY  1993   L5-S1  . LEFT HEART CATH AND CORONARY ANGIOGRAPHY N/A 01/19/2020   Procedure: LEFT HEART CATH AND CORONARY ANGIOGRAPHY;  Surgeon: Iran OuchArida, Muhammad A, MD;  Location: [facility]VAS[facility] LAB;  Service: Cardiovascular;  Laterality: N/A;  . MENISCUS REPAIR  1996  . TEE WITHOUT CARDIOVERSION N/A 01/29/2020   Procedure: TRANSESOPHAGEAL ECHOCARDIOGRAM (TEE);  Surgeon: Donata ClayVan Trigt, Theron AristaPeter, MD;  [facility];  Service: Open Heart Surgery;  Laterality: N/A;  . TONSILLECTOMY     Social History   Socioeconomic History  . Marital status: Married    Spouse name: Not on file   . Number of children: Not on file  . Years of education: Not on file  . Highest education level: Not on file  Occupational History  . Occupation: CFO  Tobacco Use  . Smoking status: Never Smoker  . Smokeless tobacco: Never Used  Vaping Use  . Vaping Use: Never used  Substance and Sexual Activity  . Alcohol use: Not Currently    Alcohol/week: 0.0 standard drinks    Comment: Very rare- occasionaly beer or wine   . Drug use: No  . Sexual activity: Yes    Birth control/protection: None  Other Topics Concern  . Not on file  Social History Narrative   Married 1970.   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   Chief of StaffCommercial Instrument rated pilot, land and sea.     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  Social Determinants of Health   Financial Resource Strain: Not on file  Food Insecurity: Not on file  Transportation Needs: Not on file  Physical Activity: Not on file  Stress: Not on file  Social Connections: Not on file  Intimate Partner Violence: Not on file   Current Outpatient Medications on File Prior to Visit  Medication Sig Dispense Refill  . amiodarone (PACERONE) 200 MG tablet Take 1 tablet (200 mg total) by mouth daily as needed. 90 tablet 3  . amLODipine (NORVASC) 10 MG tablet Take 1 tablet (10 mg total) by mouth daily. 90 tablet 3  . aspirin EC 81 MG tablet Take 1 tablet (81 mg total) by mouth daily. Swallow whole. 150 tablet 2  . atorvastatin (LIPITOR) 80 MG tablet Take 1 tablet (80 mg total) by mouth daily. 90 tablet 3  . carvedilol (COREG) 12.5 MG tablet Take 1 tablet (12.5 mg total) by mouth 2 (two) times daily. 180 tablet 3  . cholecalciferol (VITAMIN D3) 25 MCG (1000 UNIT) tablet Take 1,000 Units by mouth daily.    . Coenzyme Q10 (COQ10) 100 MG CAPS Take 100 mg by mouth daily.     . dapagliflozin propanediol (FARXIGA) 5 MG TABS tablet Take 1 tablet (5 mg total) by mouth daily before breakfast. 30 tablet 11   . fenofibrate 160 MG tablet Take 1 tablet (160 mg total) by mouth daily. 90 tablet 1  . ferrous sulfate 324 (65 Fe) MG TBEC Take 1 tablet (325 mg total) by mouth every Monday, Wednesday, and Friday.    Marland Kitchen glimepiride (AMARYL) 4 MG tablet TAKE 1 TABLET BY MOUTH EVERY DAY WITH BREAKFAST 90 tablet 1  . glucose blood (ONETOUCH ULTRA) test strip TEST 1-2x a day 200 strip 3  . lisinopril (ZESTRIL) 20 MG tablet Take 1 tablet (20 mg total) by mouth daily. 90 tablet 3  . lisinopril-hydrochlorothiazide (ZESTORETIC) 20-25 MG tablet Take 1 tablet by mouth daily. 90 tablet 3  . metFORMIN (GLUCOPHAGE) 1000 MG tablet 1500mg  in the PM    . Misc Natural Products (URINOZINC PO) Take 1 tablet by mouth in the morning and at bedtime.     . Multiple Vitamins-Minerals (PRESERVISION AREDS 2 PO) Take 1 tablet by mouth 2 (two) times daily.     . sildenafil (REVATIO) 20 MG tablet Take 20 mg by mouth as needed.    . traZODone (DESYREL) 50 MG tablet TAKE 1 TO 1.5 TABLETS BY MOUTH EVERY NIGHT FOR SLEEP 135 tablet 3  . ZINC OXIDE PO Take by mouth.     No current facility-administered medications on file prior to visit.   Allergies  Allergen Reactions  . Ozempic (0.25 Or 0.5 Mg-Dose) [Semaglutide(0.25 Or 0.5mg -Dos)]     Presumed cause of GI upset.    Amoxil [Amoxicillin] Rash   Family History  Problem Relation Age of Onset  . Hypertension Mother   . Diabetes Father   . Cancer Neg Hx   . COPD Neg Hx   . Heart disease Neg Hx   . Hyperlipidemia Neg Hx   . Stroke Neg Hx   . Colon cancer Neg Hx   . Prostate cancer Neg Hx    PE: BP 140/88 (BP Location: Right Arm, Patient Position: Sitting, Cuff Size: Normal)   Pulse 65   Ht 5' 9.5" (1.765 m)   Wt 167 lb 12.8 oz (76.1 kg)   SpO2 95%   BMI 24.42 kg/m  Wt Readings from Last 3 Encounters:  07/01/20 167 lb 12.8  oz (76.1 kg)  06/21/20 168 lb (76.2 kg)  05/10/20 168 lb (76.2 kg)   Constitutional: normal weight, in NAD Eyes: PERRLA, EOMI, no exophthalmos ENT:  moist mucous membranes, no thyromegaly, no cervical lymphadenopathy Cardiovascular: RRR, No MRG Respiratory: CTA B Gastrointestinal: abdomen soft, NT, ND, BS+ Musculoskeletal: no deformities, strength intact in all 4 Skin: moist, warm, no rashes Neurological: no tremor with outstretched hands, DTR normal in all 4  ASSESSMENT: 1. DM2, non-insulin-dependent, uncontrolled, with complications - CAD, s/p CABG x5 - CKD  2. HL  PLAN:  1. Patient with longstanding, uncontrolled, type 2 diabetes, on oral antidiabetic regimen with metformin, sulfonylurea, and SGLT2 inhibitor, with improved sugars at last visit but still above target in the morning and midday and improved later in the day.  At that time, he was tolerating [place] well, so we continued this and we also moved to the entire metformin dose with dinner to hopefully improve his morning sugars.  We did discuss with patient and his wife about adding back Ozempic but we held off at that time due to previously high lipase.  We may be able to try to add it back at a low dose in the future.  Latest HbA1c was 6.9% in 03/2020. - at today's visit, sugars are much higher. He is not sure why since he did not change his eating habits and he continues exercise.  She is taking 4 to 6 mg of glimepiride before breakfast, depending on his sugars in AM.  However, per review of his sugar log, sugars are getting up to 200s and even had 1 value in the 300s after milkshake (strongly advised him to stop drinking these).  Therefore, for now, we will need to add long-acting insulin.  I suggested to start at a low dose, 10 units daily and increase as needed and as tolerated.  Demonstrated pen use.  She agrees with the plan.  We will continue the rest of the regimen for now. - I suggested to:  Patient Instructions  Please continue: - Metformin 1500 mg with dinner - Glimepiride 4 mg before breakfast - Farxiga 5 mg before breakfast  Add: - Tresiba U200 10 units  daily Increase the dose by 2 units every 3-4 days until am sugars are <140.  Please return in 1.5 months with your sugar log.   - we checked his HbA1c: 8.4% (higher) - advised to check sugars at different times of the day - 1-2x a day, rotating check times - advised for yearly eye exams >> he is UTD - return to clinic in 1.5 months  2. HL -Reviewed latest lipid panel from 04/2020: Fractions at goal with exception of a slightly low HDL: Lab Results  Component Value Date   CHOL 122 04/11/2020   HDL 31 (L) 04/11/2020   LDLCALC 66 04/11/2020   LDLDIRECT 81.0 01/02/2020   TRIG 127 04/11/2020   CHOLHDL 3.9 04/11/2020  -Continues atorvastatin 80 mg daily, fenofibrate 160 mg daily, without side effects  Carlus Pavlov, MD PhD [facility]

## 2020-07-03 DIAGNOSIS — H524 Presbyopia: Secondary | ICD-10-CM | POA: Diagnosis not present

## 2020-07-03 DIAGNOSIS — E119 Type 2 diabetes mellitus without complications: Secondary | ICD-10-CM | POA: Diagnosis not present

## 2020-07-03 DIAGNOSIS — H2513 Age-related nuclear cataract, bilateral: Secondary | ICD-10-CM | POA: Diagnosis not present

## 2020-07-03 DIAGNOSIS — E089 Diabetes mellitus due to underlying condition without complications: Secondary | ICD-10-CM | POA: Diagnosis not present

## 2020-07-25 ENCOUNTER — Other Ambulatory Visit: Payer: Self-pay | Admitting: Family Medicine

## 2020-07-26 ENCOUNTER — Other Ambulatory Visit: Payer: Self-pay | Admitting: Family Medicine

## 2020-08-15 ENCOUNTER — Other Ambulatory Visit: Payer: Self-pay | Admitting: Family Medicine

## 2020-08-19 ENCOUNTER — Other Ambulatory Visit: Payer: Self-pay | Admitting: Family Medicine

## 2020-08-29 ENCOUNTER — Ambulatory Visit (INDEPENDENT_AMBULATORY_CARE_PROVIDER_SITE_OTHER): Payer: Medicare Other | Admitting: Internal Medicine

## 2020-08-29 ENCOUNTER — Other Ambulatory Visit: Payer: Self-pay

## 2020-08-29 ENCOUNTER — Encounter: Payer: Self-pay | Admitting: Internal Medicine

## 2020-08-29 VITALS — BP 126/60 | HR 70 | Ht 70.0 in | Wt 167.0 lb

## 2020-08-29 DIAGNOSIS — E1165 Type 2 diabetes mellitus with hyperglycemia: Secondary | ICD-10-CM | POA: Diagnosis not present

## 2020-08-29 DIAGNOSIS — E1159 Type 2 diabetes mellitus with other circulatory complications: Secondary | ICD-10-CM | POA: Diagnosis not present

## 2020-08-29 DIAGNOSIS — I2510 Atherosclerotic heart disease of native coronary artery without angina pectoris: Secondary | ICD-10-CM

## 2020-08-29 DIAGNOSIS — E785 Hyperlipidemia, unspecified: Secondary | ICD-10-CM | POA: Diagnosis not present

## 2020-08-29 LAB — POCT GLYCOSYLATED HEMOGLOBIN (HGB A1C): Hemoglobin A1C: 7.5 % — AB (ref 4.0–5.6)

## 2020-08-29 MED ORDER — GLIMEPIRIDE 4 MG PO TABS: ORAL_TABLET | ORAL | 3 refills | Status: DC

## 2020-08-29 MED ORDER — DAPAGLIFLOZIN PROPANEDIOL 10 MG PO TABS
10.0000 mg | ORAL_TABLET | Freq: Every day | ORAL | 3 refills | Status: DC
Start: 2020-08-29 — End: 2021-08-07

## 2020-08-29 MED ORDER — TRESIBA FLEXTOUCH 200 UNIT/ML ~~LOC~~ SOPN: 30.0000 [IU] | PEN_INJECTOR | Freq: Every day | SUBCUTANEOUS | 3 refills | Status: DC

## 2020-08-29 NOTE — Patient Instructions (Addendum)
Please continue: - Metformin 1500 mg with dinner - Glimepiride 4 mg before breakfast and 2-4 mg before supper  Please increase: - Farxiga 10 mg before breakfast - Tresiba U200 30 units daily  Please stop at the lab.  Please return in 3 months with your sugar log.

## 2020-08-29 NOTE — Progress Notes (Signed)
Patient ID: Luis Wise, male   DOB: 1940/10/25, 80 y.o.   MRN: [id number]   This visit occurred during the SARS-CoV-2 public health emergency.  Safety protocols were in place, including screening questions prior to the visit, additional usage of staff PPE, and extensive cleaning of exam room while observing appropriate contact time as indicated for disinfecting solutions.   HPI: Luis Wise is a 80 y.o.-year-old male, initially referred by his PCP, Dr. Para March, returning for follow-up: DM2, dx in 2004-2005, non-insulin-dependent, uncontrolled, with complications (CAD - s/p CABG x5, CKD).  He is here with his wife, who is a former Nurse, learning disability and offers part of the history especially related to medications, blood sugars, and diet.  Last visit 2 months ago.  Interim history: Patient's diabetes was better controlled before his CABG in 01/2020 and he started to improve afterwards, especially after adding [place].  However, at last visit, sugars were very high so we added long-acting insulin. He tolerates the insulin well and has no problem injecting it. He titrated the dose up, as advised. No increased urination (in fact, nocturia improved since last OV), blurry vision, nausea, chest pain.  Reviewed HbA1c levels: Lab Results  Component Value Date   HGBA1C 8.4 (A) 07/01/2020   HGBA1C 6.9 (A) 03/11/2020   HGBA1C 7.0 (H) 01/26/2020   HGBA1C 7.0 (H) 01/02/2020   HGBA1C 6.7 (A) 07/18/2019   HGBA1C 6.5 (A) 12/30/2018   HGBA1C 7.0 05/19/2018   HGBA1C 6.8 01/06/2018   HGBA1C 6.8 06/09/2017   HGBA1C 6.9 12/29/2016   Pt is on a regimen of: - Metformin 500 mg in am and 1000 mg in pm >> 1500 mg at dinnertime - Glimepiride 4-6 >> 4 mg before breakfast and 2 mg before a large dinner - Farxiga 5 mg before breakfast-added 03/2020 - Tresiba 10 >> 26 units daily-added 06/2020 She was also tried on Rybelsus but this was expensive.  He then tried Ozempic but he developed nausea and vomiting and then increase  lipase on 02/27/2020 (82  >> 119) on the 0.5 mg dose.  Ozempic was stopped at that time. Prev. On Januvia >> tolerated well but expensive.  Pt checks his sugars once a day: - am: before Sx: 138-177, 190 >> 129, 153-215, 235 >> 119-170, 183 - 2h after b'fast: n/c >> 183-251 >> n/c - before lunch: n/c >> 157, 271 >> 182-219 >> 256 - 2h after lunch: n/c >> 169 - before dinner:143, 144, 183 >> 173-239, 347 (milkshake) >> 124-209, 278 - 2h after dinner: n/c >> 166, 181 >> n/c >> 146, 184 - bedtime: n/c >> 161 >> 189, 211 >> n/c - nighttime: n/c >> 135 >> n/c Lowest sugar was 50s before sx >> 129; >> 119 he has hypoglycemia awareness in the 60s.  Highest sugar was 265 >> 347 (milkshake) >> 278  Glucometer: OneTouch ultra  Pt's meals are: - Breakfast: 1 egg + toast or cereal or oatmeal - Lunch: grilled fish + veggies - Dinner: meat + veggies - Snacks: 2: nuts, wheat crackers He walks 3 miles 3 days a week for exercise.  He finished cardiac rehab.   -No CKD, last BUN/creatinine:  Lab Results  Component Value Date   BUN 29 (H) 05/10/2020   BUN 22 03/06/2020   CREATININE 1.29 05/10/2020   CREATININE 1.25 03/06/2020  On lisinopril 20 twice a day.  -+ HL; last set of lipids: Lab Results  Component Value Date   CHOL 122 04/11/2020   HDL  31 (L) 04/11/2020   LDLCALC 66 04/11/2020   LDLDIRECT 81.0 01/02/2020   TRIG 127 04/11/2020   CHOLHDL 3.9 04/11/2020  On atorvastatin 80 mg daily, fenofibrate 160, co-Q10  On ASA 81.  - last eye exam was in 10/2019: No DR  - no numbness and tingling in his feet.  Pt has FH of DM in father in his 33s.  He is also on amiodarone.  ROS: Constitutional: no weight gain/no weight loss, no fatigue, no subjective hyperthermia, no subjective hypothermia, + improved nocturia  Eyes: no blurry vision, no xerophthalmia ENT: no sore throat, no nodules palpated in neck, no dysphagia, no odynophagia, no hoarseness Cardiovascular: no CP/no SOB/no  palpitations/no leg swelling Respiratory: no cough/no SOB/no wheezing Gastrointestinal: no N/no V/no D/no C/no acid reflux Musculoskeletal: no muscle aches/no joint aches Skin: no rashes, no hair loss Neurological: no tremors/no numbness/no tingling/no dizziness  I reviewed pt's medications, allergies, PMH, social hx, family hx, and changes were documented in the history of present illness. Otherwise, unchanged from my initial visit note.   Past Medical History:  Diagnosis Date   Allergy    Amoxicillin   Cataract 2017   Mild progression   Coronary artery disease    a. s/p 5-vessel CABG (LIMA-LAD, SVG-diag, sequential SVG-OM1-OM 2, & SVG-PDA)   Diabetes mellitus with complication (HCC)    Hyperlipidemia    Hypertension    Insomnia    neuropathy    bilateral feet   Postoperative atrial fibrillation [facility])    Past Surgical History:  Procedure Laterality Date   BACK SURGERY     CARDIAC CATHETERIZATION     01/19/2020   CORONARY ARTERY BYPASS GRAFT N/A 01/29/2020   Procedure: CORONARY ARTERY BYPASS GRAFTING (CABG) TIMES FIVE USING LIMA to LAD; ENDOSCOPIC HARVESTED GREATER SAPHENOUS VEIN: SVG to PDA; SVG to sequenced OM1 & OM2.;  Surgeon: Kerin Perna, MD;  Location: [facility] OR;  Service: Open Heart Surgery;  Laterality: N/A;   ENDOVEIN HARVEST OF GREATER SAPHENOUS VEIN Bilateral 01/29/2020   Procedure: ENDOVEIN HARVEST OF GREATER SAPHENOUS VEIN;  Surgeon: Kerin Perna, MD;  Location: [facility] OR;  Service: Open Heart Surgery;  Laterality: Bilateral;   EYE SURGERY  Radial K   1997   LAMINECTOMY  1993   L5-S1   LEFT HEART CATH AND CORONARY ANGIOGRAPHY N/A 01/19/2020   Procedure: LEFT HEART CATH AND CORONARY ANGIOGRAPHY;  Surgeon: Iran Ouch, MD;  Location: [facility] INVASIVE CV LAB;  Service: Cardiovascular;  Laterality: N/A;   MENISCUS REPAIR  1996   TEE WITHOUT CARDIOVERSION N/A 01/29/2020   Procedure: TRANSESOPHAGEAL ECHOCARDIOGRAM (TEE);  Surgeon: Donata Clay, Theron Arista, MD;  Location:  [facility] OR;  Service: Open Heart Surgery;  Laterality: N/A;   TONSILLECTOMY     Social History   Socioeconomic History   Marital status: Married    Spouse name: Not on file   Number of children: Not on file   Years of education: Not on file   Highest education level: Not on file  Occupational History   Occupation: CFO  Tobacco Use   Smoking status: Never   Smokeless tobacco: Never  Vaping Use   Vaping Use: Never used  Substance and Sexual Activity   Alcohol use: Not Currently    Alcohol/week: 0.0 standard drinks    Comment: Very rare- occasionaly beer or wine    Drug use: No   Sexual activity: Yes    Birth control/protection: None  Other Topics Concern   Not on file  Social  History Narrative   Married 1970.   [employer], Scientist, water quality at [employer].   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   Social Determinants of Health   Financial Resource Strain: Not on file  Food Insecurity: Not on file  Transportation Needs: Not on file  Physical Activity: Not on file  Stress: Not on file  Social Connections: Not on file  Intimate Partner Violence: Not on file   Current Outpatient Medications on File Prior to Visit  Medication Sig Dispense Refill   amiodarone (PACERONE) 200 MG tablet Take 1 tablet (200 mg total) by mouth daily as needed. 90 tablet 3   amLODipine (NORVASC) 10 MG tablet TAKE 1 TABLET BY MOUTH EVERY DAY 90 tablet 1   aspirin EC 81 MG tablet Take 1 tablet (81 mg total) by mouth daily. Swallow whole. 150 tablet 2   atorvastatin (LIPITOR) 80 MG tablet Take 1 tablet (80 mg total) by mouth daily. 90 tablet 3   carvedilol (COREG) 12.5 MG tablet Take 1 tablet (12.5 mg total) by mouth 2 (two) times daily. 180 tablet 3   cholecalciferol (VITAMIN D3) 25 MCG (1000 UNIT) tablet Take 1,000 Units by mouth daily.     Coenzyme Q10 (COQ10) 100 MG CAPS Take 100 mg by mouth daily.       dapagliflozin propanediol (FARXIGA) 5 MG TABS tablet Take 1 tablet (5 mg total) by mouth daily before breakfast. 30 tablet 11   fenofibrate 160 MG tablet TAKE 1 TABLET BY MOUTH EVERY DAY 90 tablet 1   ferrous sulfate 324 (65 Fe) MG TBEC Take 1 tablet (325 mg total) by mouth every Monday, Wednesday, and Friday.     glimepiride (AMARYL) 4 MG tablet TAKE 1 TABLET BY MOUTH EVERY DAY WITH BREAKFAST 90 tablet 1   glucose blood (ONETOUCH ULTRA) test strip TEST 1-2x a day 200 strip 3   insulin degludec (TRESIBA FLEXTOUCH) 200 UNIT/ML FlexTouch Pen Inject 10-14 Units into the skin daily. 9 mL 3   Insulin Pen Needle 32G X 4 MM MISC Use 1x a day 100 each 3   lisinopril (ZESTRIL) 20 MG tablet Take 1 tablet (20 mg total) by mouth daily. 90 tablet 3   lisinopril-hydrochlorothiazide (ZESTORETIC) 20-25 MG tablet TAKE 1 TABLET BY MOUTH EVERY DAY 90 tablet 1   metFORMIN (GLUCOPHAGE) 1000 MG tablet TAKE 1 TABLET BY MOUTH TWICE A DAY 180 tablet 3   Misc Natural Products (URINOZINC PO) Take 1 tablet by mouth in the morning and at bedtime.      Multiple Vitamins-Minerals (PRESERVISION AREDS 2 PO) Take 1 tablet by mouth 2 (two) times daily.      sildenafil (REVATIO) 20 MG tablet Take 20 mg by mouth as needed.     traZODone (DESYREL) 50 MG tablet TAKE 1 TO 1.5 TABLETS BY MOUTH EVERY NIGHT FOR SLEEP 135 tablet 0   ZINC OXIDE PO Take by mouth.     No current facility-administered medications on file prior to visit.   Allergies  Allergen Reactions   Ozempic (0.25 Or 0.5 Mg-Dose) [Semaglutide(0.25 Or 0.5mg -Dos)]     Presumed cause of GI upset.     Amoxil [Amoxicillin] Rash   Family History  Problem Relation Age of Onset   Hypertension Mother    Diabetes Father    Cancer Neg Hx    COPD Neg Hx    Heart disease Neg Hx    Hyperlipidemia  Neg Hx    Stroke Neg Hx    Colon cancer Neg Hx    Prostate cancer Neg Hx    PE: BP 126/60   Pulse 70   Ht 5\' 10"  (1.778 m)   Wt 167 lb (75.8 kg)   SpO2 95%   BMI 23.96  kg/m  Wt Readings from Last 3 Encounters:  08/29/20 167 lb (75.8 kg)  07/01/20 167 lb 12.8 oz (76.1 kg)  06/21/20 168 lb (76.2 kg)   Constitutional: normal weight, in NAD Eyes: PERRLA, EOMI, no exophthalmos ENT: moist mucous membranes, no thyromegaly, no cervical lymphadenopathy Cardiovascular: RRR, No MRG Respiratory: CTA B Gastrointestinal: abdomen soft, NT, ND, BS+ Musculoskeletal: no deformities, strength intact in all 4 Skin: moist, warm, no rashes Neurological: no tremor with outstretched hands, DTR normal in all 4  ASSESSMENT: 1. DM2, non-insulin-dependent, uncontrolled, with complications - CAD, s/p CABG x5 - CKD  2. HL  PLAN:  1. Patient with longstanding, uncontrolled, type 2 diabetes, on oral antidiabetic regimen with metformin, sulfonylurea, SGLT2 inhibitor, to which we added long-acting insulin at last visit, after an HbA1c returned higher, 8.4%, increased from 6.9%.  Sugars were much higher and he was not sure why they increased so significantly since he did not change his habits and he continues to exercise.  Discussed about improving diet and eliminating milkshakes (after which sugars were increasing to 300s) and we also started 06/23/20.  I advised him to titrate the dose up as needed based on blood sugars. -We did not start a GLP-1 receptor agonist so far, due to previously elevated lipase -At today's visit, sugars are better, especially after eating the insulin dose about 20 units daily.  However, sugars are still above target and he has occasional significantly hyperglycemic spikes especially after snacks.  He likes to snack on crackers.  I am suspecting a degree of insulin deficiency.  Therefore, at today's visit, we will check him for this and also check antipancreatic antibodies to screen for LADA (latent autoimmune diabetes of the adult).  We did discuss that we may also need mealtime insulin if he has insulin deficiency. -However, for now, to avoid postprandial  hyperglycemic spikes, I advised him to increase the dose of glimepiride before supper occasionally to 4 mg if he has a larger meal, also to increase Farxiga from 5 to 10 mg daily (this is expensive for him  - 150$ for 1 mo now) so he will do this after he runs out of his current supply and will also increase Tresiba from 26 to 30 units daily. - I suggested to:  Patient Instructions  Please continue: - Metformin 1500 mg with dinner - Glimepiride 4 mg before breakfast and 2-4 mg before supper  Please increase: - Farxiga 10 mg before breakfast - Tresiba U200 30 units daily  Please stop at the lab.  Please return in 3 months with your sugar log.   - we checked his HbA1c: 7.5% (better) - advised to check sugars at different times of the day - 1-2x a day, rotating check times - advised for yearly eye exams >> he is UTD - return to clinic in 3 months  2. HL -Reviewed latest lipid panel from 04/2020: Fractions at goal, with exception of a slightly low HDL Lab Results  Component Value Date   CHOL 122 04/11/2020   HDL 31 (L) 04/11/2020   LDLCALC 66 04/11/2020   LDLDIRECT 81.0 01/02/2020   TRIG 127 04/11/2020   CHOLHDL 3.9 04/11/2020  -  Continues atorvastatin 80 mg daily, fenofibrate 160 mg daily, without side effects  Component     Latest Ref Rng & Units 08/29/2020          Glucose     65 - 99 mg/dL 409170 (H)  Hemoglobin W1XA1C     4.0 - 5.6 % 7.5 (A)  C-Peptide     0.80 - 3.85 ng/mL 4.30 (H)  Islet Cell Ab     Neg:<1:1 Negative  ZNT8 Antibodies     <15 U/mL <10  Glutamic Acid Decarb Ab     <5 IU/mL <5  No insulin deficiency or pancreatic autoimmunity.  He has good insulin production.  Carlus Pavlovristina Luka Stohr, MD PhD [facility]

## 2020-08-30 LAB — ANTI-ISLET CELL ANTIBODY: Islet Cell Ab: NEGATIVE

## 2020-09-05 ENCOUNTER — Encounter: Payer: Self-pay | Admitting: Internal Medicine

## 2020-09-06 LAB — GLUCOSE, FASTING: Glucose, Bld: 170 mg/dL — ABNORMAL HIGH (ref 65–99)

## 2020-09-06 LAB — C-PEPTIDE: C-Peptide: 4.3 ng/mL — ABNORMAL HIGH (ref 0.80–3.85)

## 2020-09-06 LAB — ZNT8 ANTIBODIES: ZNT8 Antibodies: <10 U/mL (ref <15)

## 2020-09-06 LAB — GLUTAMIC ACID DECARBOXYLASE AUTO ABS: Glutamic Acid Decarb Ab: <5 IU/mL (ref <5)

## 2020-09-20 ENCOUNTER — Other Ambulatory Visit: Payer: Self-pay | Admitting: Family Medicine

## 2020-09-25 ENCOUNTER — Telehealth: Payer: Self-pay | Admitting: Cardiovascular Disease

## 2020-09-25 MED ORDER — LISINOPRIL 20 MG PO TABS: 20.0000 mg | ORAL_TABLET | Freq: Every day | ORAL | 2 refills | Status: DC

## 2020-09-25 NOTE — Telephone Encounter (Signed)
  LAST APPOINTMENT DATE:  Please schedule appointment if longer than 1 year  NEXT APPOINTMENT DATE:@Visit  date not found  MEDICATION: lisinopril-hydrochlorothiazide (ZESTORETIC) 20-25 MG tablet  Is the patient out of medication?  yes  [facility]  Let patient know to contact pharmacy at the end of the day to make sure medication is ready.  Please notify patient to allow 48-72 hours to process  Encourage patient to contact the pharmacy for refills or they can request refills through [facility]  CLINICAL FILLS OUT ALL BELOW:   LAST REFILL:  QTY:  REFILL DATE:    OTHER COMMENTS:    Okay for refill?  Please advise

## 2020-09-25 NOTE — Telephone Encounter (Signed)
Requested Prescriptions   Signed Prescriptions Disp Refills   lisinopril (ZESTRIL) 20 MG tablet 90 tablet 2    Sig: Take 1 tablet (20 mg total) by mouth daily.    Authorizing Provider: Antonieta Iba    Ordering User: Thayer Headings, Roslyn Else L

## 2020-09-25 NOTE — Telephone Encounter (Signed)
*  STAT* If patient is at the pharmacy, call can be transferred to refill team.   1. Which medications need to be refilled? (please list name of each medication and dose if known) lisinopril 20 MG 1 tablet daily   2. Which pharmacy/location (including street and city if local pharmacy) is medication to be sent to? [facility] on [address]   3. Do they need a 30 day or 90 day supply? 90 day

## 2020-10-29 ENCOUNTER — Other Ambulatory Visit: Payer: Self-pay | Admitting: Family Medicine

## 2020-11-08 ENCOUNTER — Telehealth: Payer: Self-pay

## 2020-11-08 NOTE — Telephone Encounter (Signed)
Luis Wise signed said that pt did not go to [facility] or ED. Pt is using heating pad and taking motrin. Pt is more comfortable now; when sitting in recliner or laying down pain level is 0-1.  Pt has more pain when trying to get up from sitting or laying position. Luis Wise said would monitor pt and if necessary would take him somewhere this weekend. Advised pt avoid fall; UC & ED precautions given and pt's wife voiced understanding. Sending note to DR Para March and Shanda Bumps CMA and teams to [place].

## 2020-11-08 NOTE — Telephone Encounter (Signed)
Agree. Thanks

## 2020-11-08 NOTE — Telephone Encounter (Signed)
[facility] Day - Client TELEPHONE ADVICE RECORD [facility] Patient Name: Luis Wise ON Gender: Male DOB: [birth year] Age: 80 Y 8 M 24 D Return Phone Number: [phone] (Primary), [phone] (Secondary) Address: City/ State/ Zip: [place] [address] Client [facility] Day - Client Client Site [facility] [place] - Day Physician Luis Wise - MD Contact Type Call Who Is Calling Patient / Member / Family / Caregiver Call Type Triage / Clinical Caller Name Luis Wise Relationship To Patient Spouse Return Phone Number [phone] (Secondary) Chief Complaint Walking difficulty Reason for Call Symptomatic / Request for Health Information Initial Comment The caller states that her husband is experiencing extreme back pain that is preventing him from standing up. The caller reports that the only relief is lying flat or on his sides. Translation No Nurse Assessment Nurse: Luis Dienes, RN, Luis Wise Date/Time (Eastern Time): 11/08/2020 1:40:23 PM Confirm and document reason for call. If symptomatic, describe symptoms. ---The caller states that her husband is experiencing extreme back pain (6/10) that is preventing him from standing up. The caller reports that the only relief is lying flat or on his sides. Does the patient have any new or worsening symptoms? ---Yes Will a triage be completed? ---Yes Related visit to physician within the last 2 weeks? ---No Does the PT have any chronic conditions? (i.e. diabetes, asthma, this includes High risk factors for pregnancy, etc.) ---Yes List chronic conditions. ---Diabetes Is this a behavioral health or substance abuse call? ---No Guidelines Guideline Title Affirmed Question Affirmed Notes Nurse Date/Time Luis Wise Time) Back Pain Unable to walk Luis Dienes, RN, [facility] 11/08/2020 1:41:32 PM Disp. Time Luis Wise Time) Disposition Final User 11/08/2020 1:48:19 PM Go to ED Now (or PCP  triage) Yes Luis Dienes, RN, Luis Wise PLEASE NOTE: All timestamps contained within this report are represented as [place] Standard Time. CONFIDENTIALTY NOTICE: This fax transmission is intended only for the addressee. It contains information that is legally privileged, confidential or otherwise protected from use or disclosure. If you are not the intended recipient, you are strictly prohibited from reviewing, disclosing, copying using or disseminating any of this information or taking any action in reliance on or regarding this information. If you have received this fax in error, please notify us immediately by telephone so that we can arrange for its return to [place]. Phone: [phone], Toll-Free: [phone], Fax: [phone] Page: 2 of 2 Call Id: [id number] Caller Disagree/Comply Comply Caller Understands Yes PreDisposition Home Care Care Advice Given Per Guideline GO TO ED NOW (OR PCP TRIAGE): * IF PCP SECOND-LEVEL TRIAGE REQUIRED: You may need to be seen. Your doctor (or NP/PA) will want to talk with you to decide what's best. I'll page the provider on-call now. If you haven't heard from the provider (or me) within 30 minutes, go directly to the ED/UCC at _____________ Wise. CARE ADVICE given per Back Pain (Adult) guideline. Referrals GO TO FACILITY UNDECIDED

## 2020-11-26 LAB — HM DIABETES EYE EXAM

## 2020-11-27 ENCOUNTER — Other Ambulatory Visit: Payer: Self-pay

## 2020-11-27 ENCOUNTER — Ambulatory Visit (INDEPENDENT_AMBULATORY_CARE_PROVIDER_SITE_OTHER): Payer: Medicare Other | Admitting: Internal Medicine

## 2020-11-27 ENCOUNTER — Encounter: Payer: Self-pay | Admitting: Internal Medicine

## 2020-11-27 VITALS — BP 140/72 | HR 60 | Ht 70.0 in | Wt 167.8 lb

## 2020-11-27 DIAGNOSIS — E1165 Type 2 diabetes mellitus with hyperglycemia: Secondary | ICD-10-CM

## 2020-11-27 DIAGNOSIS — E785 Hyperlipidemia, unspecified: Secondary | ICD-10-CM

## 2020-11-27 DIAGNOSIS — E1159 Type 2 diabetes mellitus with other circulatory complications: Secondary | ICD-10-CM | POA: Diagnosis not present

## 2020-11-27 DIAGNOSIS — I2510 Atherosclerotic heart disease of native coronary artery without angina pectoris: Secondary | ICD-10-CM | POA: Diagnosis not present

## 2020-11-27 LAB — POCT GLYCOSYLATED HEMOGLOBIN (HGB A1C): Hemoglobin A1C: 7.2 % — AB (ref 4.0–5.6)

## 2020-11-27 MED ORDER — FREESTYLE LIBRE 2 SENSOR MISC: 1.0000 | 3 refills | Status: DC

## 2020-11-27 NOTE — Patient Instructions (Signed)
Please continue: - Metformin 1500 mg with dinner - Glimepiride 4 mg before breakfast and 2-4 mg before supper - Farxiga 10 mg before breakfast - Tresiba U200 30 units daily  Please return in 3-4 months with your sugar log.

## 2020-11-27 NOTE — Progress Notes (Signed)
Patient ID: Luis Wise, male   DOB: Apr 04, 1940, 80 y.o.   MRN: [id number]   This visit occurred during the SARS-CoV-2 public health emergency.  Safety protocols were in place, including screening questions prior to the visit, additional usage of staff PPE, and extensive cleaning of exam room while observing appropriate contact time as indicated for disinfecting solutions.   HPI: Luis Wise is a 80 y.o.-year-old male, initially referred by his PCP, Dr. Para March, returning for follow-up: DM2, dx in 2004-2005, insulin-dependent, uncontrolled, with complications (CAD - s/p CABG x5, CKD).  He is here with his wife, who is a former Nurse, learning disability and offers part of the history especially related to medications, blood sugars, and diet.  Last visit 3 months ago.  Interim history: No increased urination, blurry vision, nausea, chest pain.  Reviewed HbA1c levels: Lab Results  Component Value Date   HGBA1C 7.5 (A) 08/29/2020   HGBA1C 8.4 (A) 07/01/2020   HGBA1C 6.9 (A) 03/11/2020   HGBA1C 7.0 (H) 01/26/2020   HGBA1C 7.0 (H) 01/02/2020   HGBA1C 6.7 (A) 07/18/2019   HGBA1C 6.5 (A) 12/30/2018   HGBA1C 7.0 05/19/2018   HGBA1C 6.8 01/06/2018   HGBA1C 6.8 06/09/2017   Pt is on a regimen of: - Metformin 500 mg in am and 1000 mg in pm >> 1500 mg at dinnertime - Glimepiride 4-6 >> 4 mg before breakfast and 2-4 mg before a large dinner - Farxiga 5 >> 10 mg before breakfast-added 03/2020 - Tresiba 10 >> 26 >> 30 >> 32 >> 30 units daily-added 06/2020 She was also tried on Rybelsus but this was expensive.  He then tried Ozempic but he developed nausea and vomiting and then increase lipase on 02/27/2020 (82  >> 119) on the 0.5 mg dose.  Ozempic was stopped at that time. Prev. On Januvia >> tolerated well but expensive.  Pt checks his sugars once a day: - am:  129, 153-215, 235 >> 119-170, 183 >> 73-118, 142, 166 - 2h after b'fast: n/c >> 183-251 >> n/c - before lunch: n/c >> 157, 271 >> 182-219 >> 256 >> n/c   - 2h after lunch: n/c >> 169 - before dinner: 173-239, 347 (milkshake) >> 124-209, 278 >> 122-163 (snack) - 2h after dinner: n/c >> 166, 181 >> n/c >> 146, 184 >>  - bedtime: n/c >> 161 >> 189, 211 >> n/c - nighttime: n/c >> 135 >> n/c Lowest sugar was 50s before sx >> 129 >> 119 >> 73; he has hypoglycemia awareness in the 60s.  Highest sugar was 347 (milkshake) >> 278 >> 200s.  Glucometer: OneTouch ultra  Pt's meals are: - Breakfast: 1 egg + toast or cereal or oatmeal - Lunch: grilled fish + veggies - Dinner: meat + veggies - Snacks: 2: nuts, wheat crackers He walks 3 miles 3 days a week for exercise.  He finished cardiac rehab.   -No CKD, last BUN/creatinine:  Lab Results  Component Value Date   BUN 29 (H) 05/10/2020   BUN 22 03/06/2020   CREATININE 1.29 05/10/2020   CREATININE 1.25 03/06/2020  On lisinopril 20 twice a day.  -+ HL; last set of lipids: Lab Results  Component Value Date   CHOL 122 04/11/2020   HDL 31 (L) 04/11/2020   LDLCALC 66 04/11/2020   LDLDIRECT 81.0 01/02/2020   TRIG 127 04/11/2020   CHOLHDL 3.9 04/11/2020  On atorvastatin 80 mg daily, fenofibrate 160, co-Q10  On ASA 81.  - last eye exam was in  05/2020: No DR reportedly  - no numbness and tingling in his feet.  Pt has FH of DM in father in his 32s.  He was just taken off amiodarone.    ROS: + See HPI  I reviewed pt's medications, allergies, PMH, social hx, family hx, and changes were documented in the history of present illness. Otherwise, unchanged from my initial visit note.  Past Medical History:  Diagnosis Date   Allergy    Amoxicillin   Cataract 2017   Mild progression   Coronary artery disease    a. s/p 5-vessel CABG (LIMA-LAD, SVG-diag, sequential SVG-OM1-OM 2, & SVG-PDA)   Diabetes mellitus with complication (HCC)    Hyperlipidemia    Hypertension    Insomnia    neuropathy    bilateral feet   Postoperative atrial fibrillation [facility])    Past Surgical History:   Procedure Laterality Date   BACK SURGERY     CARDIAC CATHETERIZATION     01/19/2020   CORONARY ARTERY BYPASS GRAFT N/A 01/29/2020   Procedure: CORONARY ARTERY BYPASS GRAFTING (CABG) TIMES FIVE USING LIMA to LAD; ENDOSCOPIC HARVESTED GREATER SAPHENOUS VEIN: SVG to PDA; SVG to sequenced OM1 & OM2.;  Surgeon: Kerin Perna, MD;  Location: [facility] OR;  Service: Open Heart Surgery;  Laterality: N/A;   ENDOVEIN HARVEST OF GREATER SAPHENOUS VEIN Bilateral 01/29/2020   Procedure: ENDOVEIN HARVEST OF GREATER SAPHENOUS VEIN;  Surgeon: Kerin Perna, MD;  Location[facility] OR;  Service: Open Heart Surgery;  Laterality: Bilateral;   EYE SURGERY  Radial K   1997   LAMINECTOMY  1993   L5-S1   LEFT HEART CATH AND CORONARY ANGIOGRAPHY N/A 01/19/2020   Procedure: LEFT HEART CATH AND CORONARY ANGIOGRAPHY;  Surgeon: Iran Ouch, MD;  Location: [facility] INVASIVE CV LAB;  Service: Cardiovascular;  Laterality: N/A;   MENISCUS REPAIR  1996   TEE WITHOUT CARDIOVERSION N/A 01/29/2020   Procedure: TRANSESOPHAGEAL ECHOCARDIOGRAM (TEE);  Surgeon: Donata Clay, Theron Arista, MD;  Location: [facility] OR;  Service: Open Heart Surgery;  Laterality: N/A;   TONSILLECTOMY     Social History   Socioeconomic History   Marital status: Married    Spouse name: Not on file   Number of children: Not on file   Years of education: Not on file   Highest education level: Not on file  Occupational History   Occupation: CFO  Tobacco Use   Smoking status: Never   Smokeless tobacco: Never  Vaping Use   Vaping Use: Never used  Substance and Sexual Activity   Alcohol use: Not Currently    Alcohol/week: 0.0 standard drinks    Comment: Very rare- occasionaly beer or wine    Drug use: No   Sexual activity: Yes    Birth control/protection: None  Other Topics Concern   Not on file  Social History Narrative   Married 1970.   [employer], Scientist, water quality at [employer].   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   Social Determinants of Health   Financial Resource Strain: Not on file  Food Insecurity: Not on file  Transportation Needs: Not on file  Physical Activity: Not on file  Stress: Not on file  Social Connections: Not on file  Intimate Partner Violence: Not on file   Current Outpatient Medications on File Prior to Visit  Medication Sig Dispense Refill   amiodarone (PACERONE) 200 MG tablet Take 1 tablet (200  mg total) by mouth daily as needed. 90 tablet 3   amLODipine (NORVASC) 10 MG tablet TAKE 1 TABLET BY MOUTH EVERY DAY 90 tablet 1   aspirin EC 81 MG tablet Take 1 tablet (81 mg total) by mouth daily. Swallow whole. 150 tablet 2   atorvastatin (LIPITOR) 80 MG tablet Take 1 tablet (80 mg total) by mouth daily. 90 tablet 3   carvedilol (COREG) 12.5 MG tablet Take 1 tablet (12.5 mg total) by mouth 2 (two) times daily. 180 tablet 3   cholecalciferol (VITAMIN D3) 25 MCG (1000 UNIT) tablet Take 1,000 Units by mouth daily.     Coenzyme Q10 (COQ10) 100 MG CAPS Take 100 mg by mouth daily.      dapagliflozin propanediol (FARXIGA) 10 MG TABS tablet Take 1 tablet (10 mg total) by mouth daily before breakfast. 90 tablet 3   fenofibrate 160 MG tablet TAKE 1 TABLET BY MOUTH EVERY DAY 90 tablet 1   ferrous sulfate 324 (65 Fe) MG TBEC Take 1 tablet (325 mg total) by mouth every Monday, Wednesday, and Friday.     glimepiride (AMARYL) 4 MG tablet TAKE 1 TABLET BY MOUTH BEFORE BREAKFAST AND SUPPER 180 tablet 3   glucose blood (ONETOUCH ULTRA) test strip TEST 1-2x a day 200 strip 3   insulin degludec (TRESIBA FLEXTOUCH) 200 UNIT/ML FlexTouch Pen Inject 30 Units into the skin daily. 12 mL 3   Insulin Pen Needle 32G X 4 MM MISC Use 1x a day 100 each 3   lisinopril (ZESTRIL) 20 MG tablet Take 1 tablet (20 mg total) by mouth daily. 90 tablet 2   lisinopril-hydrochlorothiazide (ZESTORETIC) 20-25 MG tablet TAKE 1 TABLET BY MOUTH EVERY DAY 90 tablet  1   metFORMIN (GLUCOPHAGE) 1000 MG tablet TAKE 1 TABLET BY MOUTH TWICE A DAY 180 tablet 3   Misc Natural Products (URINOZINC PO) Take 1 tablet by mouth in the morning and at bedtime.      Multiple Vitamins-Minerals (PRESERVISION AREDS 2 PO) Take 1 tablet by mouth 2 (two) times daily.      sildenafil (REVATIO) 20 MG tablet Take 20 mg by mouth as needed.     traZODone (DESYREL) 50 MG tablet TAKE 1 TO 1.5 TABLETS BY MOUTH EVERY NIGHT FOR SLEEP 135 tablet 0   ZINC OXIDE PO Take by mouth.     No current facility-administered medications on file prior to visit.   Allergies  Allergen Reactions   Ozempic (0.25 Or 0.5 Mg-Dose) [Semaglutide(0.25 Or 0.5mg -Dos)]     Presumed cause of GI upset.     Amoxil [Amoxicillin] Rash   Family History  Problem Relation Age of Onset   Hypertension Mother    Diabetes Father    Cancer Neg Hx    COPD Neg Hx    Heart disease Neg Hx    Hyperlipidemia Neg Hx    Stroke Neg Hx    Colon cancer Neg Hx    Prostate cancer Neg Hx    PE: There were no vitals taken for this visit. Wt Readings from Last 3 Encounters:  08/29/20 167 lb (75.8 kg)  07/01/20 167 lb 12.8 oz (76.1 kg)  06/21/20 168 lb (76.2 kg)   Constitutional: normal weight, in NAD Eyes: PERRLA, EOMI, no exophthalmos ENT: moist mucous membranes, no thyromegaly, no cervical lymphadenopathy Cardiovascular: RRR, No MRG Respiratory: CTA B Gastrointestinal: abdomen soft, NT, ND, BS+ Musculoskeletal: no deformities, strength intact in all 4 Skin: moist, warm, no rashes Neurological: no tremor with outstretched hands, DTR  normal in all 4  ASSESSMENT: 1. DM2, insulin-dependent, uncontrolled, with complications - CAD, s/p CABG x5 - CKD  Component     Latest Ref Rng & Units 08/29/2020          Glucose     65 - 99 mg/dL 993 (H)  Hemoglobin Z1I     4.0 - 5.6 % 7.5 (A)  C-Peptide     0.80 - 3.85 ng/mL 4.30 (H)  Islet Cell Ab     Neg:<1:1 Negative  ZNT8 Antibodies     <15 U/mL <10  Glutamic Acid  Decarb Ab     <5 IU/mL <5  No insulin deficiency or pancreatic autoimmunity.  He has good insulin production.  2. HL  PLAN:  1. Patient with longstanding, uncontrolled, type 2 diabetes, on oral antidiabetic regimen with metformin, sulfonylurea, and SGLT2 inhibitor to which we added long-acting insulin after an HbA1c returned higher, at 8.4% earlier this year.  We did not start a GLP-1 receptor agonist so far, due to previously elevated lipase. -At last visit, sugars are better, but still above target and he still had occasional hyperglycemic spikes after snacks.  We discussed about trying to cut this down.  I did suspect a degree of insulin deficiency but we checked his insulin production and antipancreatic antibodies at last visit and these were normal.  At that time, to avoid postprandial hyperglycemic spikes, we increased the dose of glimepiride before supper and we also increased Farxiga from 5 to 10 mg daily.  We also increase Tresiba dose. -At today's visit, sugars are definitely improved from before especially in the last 2 months.  In 09/2020, he still has some higher blood sugars in the morning in the 150s and 160s and also occasional 200s later in the day, but not in the last 2 months.  In fact, most of his blood sugars are at goal in the morning now and they are occasionally slightly higher later in the day before dinner as he is usually checking after a snack.  Due to the significant improvement in his blood sugars and the fact that he is tolerating the regimen well, I did not suggest any changes in his regimen today.  I anticipate that the next HbA1c will probably be lower than 7%. - I suggested to:  Patient Instructions  Please continue: - Metformin 1500 mg with dinner - Glimepiride 4 mg before breakfast and 2-4 mg before supper - Farxiga 10 mg before breakfast - Tresiba U200 30 units daily  Please return in 3-4 months with your sugar log.   - we checked his HbA1c: 7.2%  (improved) - advised to check sugars at different times of the day - 1x a day, rotating check times - advised for yearly eye exams >> he is not UTD - return to clinic in 3-4 months  2. HL -Reviewed latest lipid panel from 04/2020: Fractions at goal with the exception of a slightly low HDL: Lab Results  Component Value Date   CHOL 122 04/11/2020   HDL 31 (L) 04/11/2020   LDLCALC 66 04/11/2020   LDLDIRECT 81.0 01/02/2020   TRIG 127 04/11/2020   CHOLHDL 3.9 04/11/2020  -He continues on atorvastatin 80 mg daily and fenofibrate 160 mg daily without side effects  Carlus Pavlov, MD PhD [facility]

## 2020-11-30 ENCOUNTER — Ambulatory Visit (INDEPENDENT_AMBULATORY_CARE_PROVIDER_SITE_OTHER): Payer: Medicare Other

## 2020-11-30 DIAGNOSIS — Z0000 Encounter for general adult medical examination without abnormal findings: Secondary | ICD-10-CM

## 2020-11-30 NOTE — Progress Notes (Addendum)
Subjective:   Luis Wise is a 80 y.o. male who presents for Medicare Annual/Subsequent preventive examination.  I connected with  Karle Barr on 11/30/20 by a audio enabled telemedicine application and verified that I am speaking with the correct person using two identifiers.   I discussed the limitations of evaluation and management by telemedicine. The patient expressed understanding and agreed to proceed. Location of patient: Home Location of provide: Office  Person that are participating in the visit with Luis Wise (patient) and Berneice Gandy, RMA   Review of Systems    Defer to PCP       Objective:    There were no vitals filed for this visit. There is no height or weight on file to calculate BMI.  Advanced Directives 03/18/2020 03/11/2020 01/30/2020 01/26/2020 01/19/2020 12/29/2019 12/29/2019  Does Patient Have a Medical Advance Directive? Yes Yes No No No Yes Yes  Type of Advance Directive - - - - - Midwife;Living will Healthcare Power of [place];Living will  Does patient want to make changes to medical advance directive? No - Patient declined - No - Patient declined No - Patient declined No - Patient declined Yes (ED - Information included in AVS) -  Would patient like information on creating a medical advance directive? - - No - Patient declined No - Patient declined No - Patient declined - -    Current Medications (verified) Outpatient Encounter Medications as of 11/30/2020  Medication Sig   amLODipine (NORVASC) 10 MG tablet TAKE 1 TABLET BY MOUTH EVERY DAY   aspirin EC 81 MG tablet Take 1 tablet (81 mg total) by mouth daily. Swallow whole.   atorvastatin (LIPITOR) 80 MG tablet Take 1 tablet (80 mg total) by mouth daily.   carvedilol (COREG) 12.5 MG tablet Take 1 tablet (12.5 mg total) by mouth 2 (two) times daily.   cholecalciferol (VITAMIN D3) 25 MCG (1000 UNIT) tablet Take 1,000 Units by mouth daily.   Coenzyme Q10 (COQ10) 100 MG CAPS Take 100 mg  by mouth daily.    Continuous Blood Gluc Sensor (FREESTYLE LIBRE 2 SENSOR) MISC 1 each by Does not apply route every 14 (fourteen) days.   dapagliflozin propanediol (FARXIGA) 10 MG TABS tablet Take 1 tablet (10 mg total) by mouth daily before breakfast.   fenofibrate 160 MG tablet TAKE 1 TABLET BY MOUTH EVERY DAY   ferrous sulfate 324 (65 Fe) MG TBEC Take 1 tablet (325 mg total) by mouth every Monday, Wednesday, and Friday.   glimepiride (AMARYL) 4 MG tablet TAKE 1 TABLET BY MOUTH BEFORE BREAKFAST AND SUPPER   glucose blood (ONETOUCH ULTRA) test strip TEST 1-2x a day   insulin degludec (TRESIBA FLEXTOUCH) 200 UNIT/ML FlexTouch Pen Inject 30 Units into the skin daily.   Insulin Pen Needle 32G X 4 MM MISC Use 1x a day   lisinopril (ZESTRIL) 20 MG tablet Take 1 tablet (20 mg total) by mouth daily.   lisinopril-hydrochlorothiazide (ZESTORETIC) 20-25 MG tablet TAKE 1 TABLET BY MOUTH EVERY DAY   metFORMIN (GLUCOPHAGE) 1000 MG tablet TAKE 1 TABLET BY MOUTH TWICE A DAY   Misc Natural Products (URINOZINC PO) Take 1 tablet by mouth in the morning and at bedtime.    Multiple Vitamins-Minerals (PRESERVISION AREDS 2 PO) Take 1 tablet by mouth 2 (two) times daily.    sildenafil (REVATIO) 20 MG tablet Take 20 mg by mouth as needed.   traZODone (DESYREL) 50 MG tablet TAKE 1 TO 1.5 TABLETS BY MOUTH EVERY  NIGHT FOR SLEEP   ZINC OXIDE PO Take by mouth.   No facility-administered encounter medications on file as of 11/30/2020.    Allergies (verified) Ozempic (0.25 or 0.5 mg-dose) [semaglutide(0.25 or 0.5mg -dos)] and Amoxil [amoxicillin]   History: Past Medical History:  Diagnosis Date   Allergy    Amoxicillin   Cataract 2017   Mild progression   Coronary artery disease    a. s/p 5-vessel CABG (LIMA-LAD, SVG-diag, sequential SVG-OM1-OM 2, & SVG-PDA)   Diabetes mellitus with complication (HCC)    Hyperlipidemia    Hypertension    Insomnia    neuropathy    bilateral feet   Postoperative atrial  fibrillation (HCC)    Past Surgical History:  Procedure Laterality Date   BACK SURGERY     CARDIAC CATHETERIZATION     01/19/2020   CORONARY ARTERY BYPASS GRAFT N/A 01/29/2020   Procedure: CORONARY ARTERY BYPASS GRAFTING (CABG) TIMES FIVE USING LIMA to LAD; ENDOSCOPIC HARVESTED GREATER SAPHENOUS VEIN: SVG to PDA; SVG to sequenced OM1 & OM2.;  Surgeon: Kerin Perna, MD;  Location: [facility] OR;  Service: Open Heart Surgery;  Laterality: N/A;   ENDOVEIN HARVEST OF GREATER SAPHENOUS VEIN Bilateral 01/29/2020   Procedure: ENDOVEIN HARVEST OF GREATER SAPHENOUS VEIN;  Surgeon: Kerin Perna, MD;  Location: [facility] OR;  Service: Open Heart Surgery;  Laterality: Bilateral;   EYE SURGERY  Radial K   1997   LAMINECTOMY  1993   L5-S1   LEFT HEART CATH AND CORONARY ANGIOGRAPHY N/A 01/19/2020   Procedure: LEFT HEART CATH AND CORONARY ANGIOGRAPHY;  Surgeon: Iran Ouch, MD;  Location: [facility] INVASIVE CV LAB;  Service: Cardiovascular;  Laterality: N/A;   MENISCUS REPAIR  1996   TEE WITHOUT CARDIOVERSION N/A 01/29/2020   Procedure: TRANSESOPHAGEAL ECHOCARDIOGRAM (TEE);  Surgeon: Donata Clay, Theron Arista, MD;  Location: [facility] OR;  Service: Open Heart Surgery;  Laterality: N/A;   TONSILLECTOMY     Family History  Problem Relation Age of Onset   Hypertension Mother    Diabetes Father    Cancer Neg Hx    COPD Neg Hx    Heart disease Neg Hx    Hyperlipidemia Neg Hx    Stroke Neg Hx    Colon cancer Neg Hx    Prostate cancer Neg Hx    Social History   Socioeconomic History   Marital status: Married    Spouse name: Not on file   Number of children: Not on file   Years of education: Not on file   Highest education level: Not on file  Occupational History   Occupation: CFO  Tobacco Use   Smoking status: Never   Smokeless tobacco: Never  Vaping Use   Vaping Use: Never used  Substance and Sexual Activity   Alcohol use: Not Currently    Alcohol/week: 0.0 standard drinks    Comment: Very rare- occasionaly  beer or wine    Drug use: No   Sexual activity: Yes    Birth control/protection: None  Other Topics Concern   Not on file  Social History Narrative   Married 1970.   [employer], Scientist, water quality at [employer].   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   Social Determinants of Health   Financial Resource Strain: Not on file  Food Insecurity: Not on file  Transportation Needs: Not on file  Physical Activity: Not on file  Stress: Not on file  Social Connections: Not on file    Tobacco Counseling Counseling given: Not Answered   Clinical Intake:                 Diabetic?Yes         Activities of Daily Living In your present state of health, do you have any difficulty performing the following activities: 01/30/2020 01/26/2020  Hearing? N N  Vision? N N  Difficulty concentrating or making decisions? N N  Walking or climbing stairs? N N  Dressing or bathing? N N  Doing errands, shopping? N N  Some recent data might be hidden    Patient Care Team: Joaquim Nam, MD as PCP - General (Family Medicine) Antonieta Iba, MD as PCP - Cardiology (Cardiology)  Indicate any recent Medical Services you may have received from other than [facility] providers in the past year (date may be approximate).     Assessment:   This is a routine wellness examination for Luis Wise.  Hearing/Vision screen No results found.  Dietary issues and exercise activities discussed:     Goals Addressed   None    Depression Screen PHQ 2/9 Scores 03/19/2020 03/11/2020 01/11/2020 06/13/2018 06/09/2017 09/14/2016 06/08/2016  PHQ - 2 Score 0 0 0 0 0 0 0  PHQ- 9 Score 0 - - - - - -    Fall Risk Fall Risk  03/18/2020 03/11/2020 02/27/2020 06/13/2018 06/09/2017  Falls in the past year? 0 0 0 0 No  Number falls in past yr: 0 - 0 - -  Injury with Fall? 0 - - - -  Risk for fall due to : No Fall Risks -  History of fall(s) - -  Follow up Falls evaluation completed;Education provided;Falls prevention discussed - Falls evaluation completed - -    FALL RISK PREVENTION PERTAINING TO THE HOME:  Any stairs in or around the home? Yes  If so, are there any without handrails? No  Home free of loose throw rugs in walkways, pet beds, electrical cords, etc? No  Adequate lighting in your home to reduce risk of falls? Yes   ASSISTIVE DEVICES UTILIZED TO PREVENT FALLS:  Life alert? No  Use of a cane, walker or w/c? No  Grab bars in the bathroom? Yes  Shower chair or bench in shower? No  Elevated toilet seat or a handicapped toilet? No   TIMED UP AND GO:  Was the test performed? No .      Cognitive Function:     6CIT Screen 06/13/2018 06/09/2017  What Year? 0 points 0 points  What month? 0 points 0 points  What time? 0 points 0 points  Count back from 20 0 points 0 points  Months in reverse 0 points 0 points  Repeat phrase 0 points 2 points  Total Score 0 2    Immunizations Immunization History  Administered Date(s) Administered   Fluad Quad(high Dose 65+) 12/04/2019   Influenza Inj Mdck Quad Pf 12/16/2017   Influenza, High Dose Seasonal PF 12/25/2016, 12/15/2018   Influenza-Unspecified 12/17/2015, 12/16/2017   PFIZER Comirnaty(Gray Top)Covid-19 Tri-Sucrose Vaccine 06/14/2020   PFIZER(Purple Top)SARS-COV-2 Vaccination 03/15/2019, 04/08/2019, 12/04/2019   Pneumococcal Conjugate-13 03/09/2013   Pneumococcal Polysaccharide-23 03/09/2012, 03/09/2014   Tetanus 03/09/2014   Zoster Recombinat (Shingrix) 01/11/2018, 03/14/2018    TDAP status: Up to date  Flu Vaccine status: Up to date  Pneumococcal vaccine status: Up to date  Covid-19 vaccine status: Completed vaccines  Qualifies for Shingles Vaccine? Yes   Zostavax completed Yes  Shingrix Completed?: Yes  Screening Tests Health Maintenance  Topic Date Due   INFLUENZA VACCINE  10/07/2020   OPHTHALMOLOGY EXAM  10/23/2020    FOOT EXAM  01/10/2021   HEMOGLOBIN A1C  05/27/2021   TETANUS/TDAP  03/09/2024   COVID-19 Vaccine  Completed   Hepatitis C Screening  Completed   Zoster Vaccines- Shingrix  Completed   HPV VACCINES  Aged Out    Health Maintenance  Health Maintenance Due  Topic Date Due   INFLUENZA VACCINE  10/07/2020   OPHTHALMOLOGY EXAM  10/23/2020    Colorectal cancer screening: Referral to GI placed No. Pt aware the office will call re: appt.  Lung Cancer Screening: (Low Dose CT Chest recommended if Age 54-80 years, 30 pack-year currently smoking OR have quit w/in 15years.) does qualify.   Lung Cancer Screening Referral: No  Additional Screening:  Hepatitis C Screening: does qualify; Completed Yes  Vision Screening: Recommended annual ophthalmology exams for early detection of glaucoma and other disorders of the eye. Is the patient up to date with their annual eye exam?  Yes  Who is the provider or what is the name of the office in which the patient attends annual eye exams? Eight months ago If pt is not established with a provider, would they like to be referred to a provider to establish care? No .   Dental Screening: Recommended annual dental exams for proper oral hygiene  Community Resource Referral / Chronic Care Management: CRR required this visit?  No   CCM required this visit?  No      Plan:     I have personally reviewed and noted the following in the patient's chart:   Medical and social history Use of alcohol, tobacco or illicit drugs  Current medications and supplements including opioid prescriptions. Patient is not currently taking opioid prescriptions. Functional ability and status Nutritional status Physical activity Advanced directives List of other physicians Hospitalizations, surgeries, and ER visits in previous 12 months Vitals Screenings to include cognitive, depression, and falls Referrals and appointments  In addition, I have reviewed and discussed  with patient certain preventive protocols, quality metrics, and best practice recommendations. A written personalized care plan for preventive services as well as general preventive health recommendations were provided to patient.     Berneice Gandy, RMA   11/30/2020   Nurse Notes: Non face to face 60 minute visit   Luis Wise , Thank you for taking time to come for your Medicare Wellness Visit. I appreciate your ongoing commitment to your health goals. Please review the following plan we discussed and let me know if I can assist you in the future.   These are the goals we discussed:  Goals   None     This is a list of the screening recommended for you and due dates:  Health Maintenance  Topic Date Due   Flu Shot  10/07/2020   Eye exam for diabetics  10/23/2020   Complete foot exam   01/10/2021   Hemoglobin A1C  05/27/2021   Tetanus Vaccine  03/09/2024   COVID-19 Vaccine  Completed   Hepatitis C Screening: USPSTF Recommendation to screen - Ages 18-79 yo.  Completed   Zoster (Shingles) Vaccine  Completed   HPV Vaccine  Aged Out  ============  I have reviewed and agree with note, evaluation, plan.    I was available by phone and digitally at time of visit. Crawford Givens, MD.

## 2020-11-30 NOTE — Patient Instructions (Signed)
Health Maintenance, Male Adopting a healthy lifestyle and getting preventive care are important in promoting health and wellness. Ask your health care provider about: The right schedule for you to have regular tests and exams. Things you can do on your own to prevent diseases and keep yourself healthy. What should I know about diet, weight, and exercise? Eat a healthy diet  Eat a diet that includes plenty of vegetables, fruits, low-fat dairy products, and lean protein. Do not eat a lot of foods that are high in solid fats, added sugars, or sodium. Maintain a healthy weight Body mass index (BMI) is a measurement that can be used to identify possible weight problems. It estimates body fat based on height and weight. Your health care provider can help determine your BMI and help you achieve or maintain a healthy weight. Get regular exercise Get regular exercise. This is one of the most important things you can do for your health. Most adults should: Exercise for at least 150 minutes each week. The exercise should increase your heart rate and make you sweat (moderate-intensity exercise). Do strengthening exercises at least twice a week. This is in addition to the moderate-intensity exercise. Spend less time sitting. Even light physical activity can be beneficial. Watch cholesterol and blood lipids Have your blood tested for lipids and cholesterol at 78 years of age, then have this test every 5 years. You may need to have your cholesterol levels checked more often if: Your lipid or cholesterol levels are high. You are older than 78 years of age. You are at high risk for heart disease. What should I know about cancer screening? Many types of cancers can be detected early and may often be prevented. Depending on your health history and family history, you may need to have cancer screening at various ages. This may include screening for: Colorectal cancer. Prostate cancer. Skin cancer. Lung  cancer. What should I know about heart disease, diabetes, and high blood pressure? Blood pressure and heart disease High blood pressure causes heart disease and increases the risk of stroke. This is more likely to develop in people who have high blood pressure readings, are of African descent, or are overweight. Talk with your health care provider about your target blood pressure readings. Have your blood pressure checked: Every 3-5 years if you are 18-39 years of age. Every year if you are 40 years old or older. If you are between the ages of 65 and 75 and are a current or former smoker, ask your health care provider if you should have a one-time screening for abdominal aortic aneurysm (AAA). Diabetes Have regular diabetes screenings. This checks your fasting blood sugar level. Have the screening done: Once every three years after age 45 if you are at a normal weight and have a low risk for diabetes. More often and at a younger age if you are overweight or have a high risk for diabetes. What should I know about preventing infection? Hepatitis B If you have a higher risk for hepatitis B, you should be screened for this virus. Talk with your health care provider to find out if you are at risk for hepatitis B infection. Hepatitis C Blood testing is recommended for: Everyone born from 1945 through 1965. Anyone with known risk factors for hepatitis C. Sexually transmitted infections (STIs) You should be screened each year for STIs, including gonorrhea and chlamydia, if: You are sexually active and are younger than 78 years of age. You are older than 78 years   of age and your health care provider tells you that you are at risk for this type of infection. Your sexual activity has changed since you were last screened, and you are at increased risk for chlamydia or gonorrhea. Ask your health care provider if you are at risk. Ask your health care provider about whether you are at high risk for HIV.  Your health care provider may recommend a prescription medicine to help prevent HIV infection. If you choose to take medicine to prevent HIV, you should first get tested for HIV. You should then be tested every 3 months for as long as you are taking the medicine. Follow these instructions at home: Lifestyle Do not use any products that contain nicotine or tobacco, such as cigarettes, e-cigarettes, and chewing tobacco. If you need help quitting, ask your health care provider. Do not use street drugs. Do not share needles. Ask your health care provider for help if you need support or information about quitting drugs. Alcohol use Do not drink alcohol if your health care provider tells you not to drink. If you drink alcohol: Limit how much you have to 0-2 drinks a day. Be aware of how much alcohol is in your drink. In the [place] one drink equals one 12 oz bottle of beer (355 mL), one 5 oz glass of wine (148 mL), or one 1 oz glass of hard liquor (44 mL). General instructions Schedule regular health, dental, and eye exams. Stay current with your vaccines. Tell your health care provider if: You often feel depressed. You have ever been abused or do not feel safe at home. Summary Adopting a healthy lifestyle and getting preventive care are important in promoting health and wellness. Follow your health care provider's instructions about healthy diet, exercising, and getting tested or screened for diseases. Follow your health care provider's instructions on monitoring your cholesterol and blood pressure. This information is not intended to replace advice given to you by your health care provider. Make sure you discuss any questions you have with your health care provider. Document Revised: 05/03/2020 Document Reviewed: 02/16/2018 Elsevier Patient Education  2022 [employer]vier Inc[employer]  

## 2020-12-22 ENCOUNTER — Telehealth: Payer: Self-pay | Admitting: Family Medicine

## 2020-12-22 MED ORDER — MOLNUPIRAVIR EUA 200MG CAPSULE: 4.0000 | ORAL_CAPSULE | Freq: Two times a day (BID) | ORAL | 0 refills | Status: AC

## 2020-12-22 NOTE — Telephone Encounter (Signed)
Noted. Thanks.

## 2020-12-22 NOTE — Telephone Encounter (Signed)
Call [facility] Pt is 80 yo with CAD and DM2 Just tested positive for covid  Symptoms are mild without any fever or shortness of breath  Wants an antiviral Wife has covid and was started on anti viral   No recent renal labs On multiple medications   Choose molnupiravir instead of paxlovid for that reason [facility] [address]  Adv to call office Monday to alert Dr Para March and schedule virtual visit

## 2020-12-23 ENCOUNTER — Other Ambulatory Visit: Payer: Self-pay

## 2020-12-23 ENCOUNTER — Telehealth (INDEPENDENT_AMBULATORY_CARE_PROVIDER_SITE_OTHER): Payer: Medicare Other | Admitting: Family Medicine

## 2020-12-23 ENCOUNTER — Encounter: Payer: Self-pay | Admitting: Family Medicine

## 2020-12-23 VITALS — BP 112/60 | Temp 97.6°F | Ht 70.0 in | Wt 167.0 lb

## 2020-12-23 DIAGNOSIS — U071 COVID-19: Secondary | ICD-10-CM

## 2020-12-23 DIAGNOSIS — E118 Type 2 diabetes mellitus with unspecified complications: Secondary | ICD-10-CM | POA: Diagnosis not present

## 2020-12-23 DIAGNOSIS — I2510 Atherosclerotic heart disease of native coronary artery without angina pectoris: Secondary | ICD-10-CM | POA: Diagnosis not present

## 2020-12-23 DIAGNOSIS — D649 Anemia, unspecified: Secondary | ICD-10-CM | POA: Diagnosis not present

## 2020-12-23 NOTE — Telephone Encounter (Signed)
PLEASE NOTE: All timestamps contained within this report are represented as [place] Standard Time. CONFIDENTIALTY NOTICE: This fax transmission is intended only for the addressee. It contains information that is legally privileged, confidential or otherwise protected from use or disclosure. If you are not the intended recipient, you are strictly prohibited from reviewing, disclosing, copying using or disseminating any of this information or taking any action in reliance Wise or regarding this information. If you have received this fax in error, please notify us immediately by telephone so that we can arrange for its return to [place]. Phone: [phone], Toll-Free: [phone], Fax: [phone] Page: 1 of 3 Call Id: [id number] [facility] Night - Client TELEPHONE ADVICE RECORD AccessNurse Patient Name: Luis Wise Gender: Male DOB: 24-Dec-1940 Age: 80 Y 10 M 7 D Return Phone Number: [phone] (Primary), [phone] (Secondary) Address: [place]/ [place]/ [place] [address] Client [facility] Night - Client Client Site  [facility] - Night Physician Raechel Ache - MD Contact Type Call Who Is Calling Patient / Member / Family / Caregiver Call Type Triage / Clinical Caller Name Molly Relationship To Patient Spouse Return Phone Number [phone] (Primary) Chief Complaint Headache Reason for Call Symptomatic / Request for Health Information Initial Comment Caller states, pt pos covid 19 and wanting antivirals called in. Pt has headache, congestion, cough, and aches. Translation No Nurse Assessment Nurse: Vito Backers, RN, Ebone Date/Time (Eastern Time): 12/22/2020 9:43:50 AM Confirm and document reason for call. If symptomatic, describe symptoms. ---Caller states, Test positive for Covid 19 this morning Caller states she is positive as well and is Wise antivirals and now wants antiviral for husband. Pt  has headache, congestion, cough, and aches. Does the patient have any new or worsening symptoms? ---Yes Will a triage be completed? ---Yes Related visit to physician within the last 2 weeks? ---No Does the PT have any chronic conditions? (i.e. diabetes, asthma, this includes High risk factors for pregnancy, etc.) ---Yes List chronic conditions. ---Diabetes, 5 Cardiac Bipasses in December Is this a behavioral health or substance abuse call? ---No Guidelines Guideline Title Affirmed Question Affirmed Notes Nurse Date/Time (Eastern Time) COVID-19 - Diagnosed or Suspected [1] HIGH RISK for severe COVID complications (e.g., weak immune system, age > 64 years, obesity with BMI 30 or higher, pregnant, chronic Walker-Foster, RN, Ebone 12/22/2020 9:46:59 AM PLEASE NOTE: All timestamps contained within this report are represented as [place] Standard Time. CONFIDENTIALTY NOTICE: This fax transmission is intended only for the addressee. It contains information that is legally privileged, confidential or otherwise protected from use or disclosure. If you are not the intended recipient, you are strictly prohibited from reviewing, disclosing, copying using or disseminating any of this information or taking any action in reliance Wise or regarding this information. If you have received this fax in error, please notify us immediately by telephone so that we can arrange for its return to [place]. Phone: [phone], Toll-Free: [phone], Fax: [phone] Page: 2 of 3 Call Id: [id number] Guidelines Guideline Title Affirmed Question Affirmed Notes Nurse Date/Time Lamount Cohen Time) lung disease or other chronic medical condition) AND [2] COVID symptoms (e.g., cough, fever) (Exceptions: Already seen by PCP and no new or worsening symptoms.) Disp. Time Lamount Cohen Time) Disposition Final User 12/22/2020 9:53:21 AM Paged Wise Call back to [facility], RN, Ebone 12/22/2020 9:55:03 AM  Paged Wise Call back to [facility], RN, Ebone 12/22/2020 9:54:16 AM Call PCP within 24 Hours Yes Walker-Foster, RN, Jefm Petty Disagree/Comply Comply  Caller Understands Yes PreDisposition Call Doctor Care Advice Given Per Guideline CALL PCP WITHIN 24 HOURS: * You need to discuss this with your doctor (or NP/PA) within the next 24 hours. * ACETAMINOPHEN - EXTRA STRENGTH TYLENOL: Take 1,000 mg (two 500 mg pills) every 6 to 8 hours as needed. Each Extra Strength Tylenol pill has 500 mg of acetaminophen. The most you should take is 6 pills a day (3,000 mg total). Note: In [place], the maximum is 8 pills a day (4,000 mg total). * IBUPROFEN (E.G., MOTRIN, ADVIL): Take 400 mg (two 200 mg pills) by mouth every 6 hours. The most you should take is 6 pills a day (1,200 mg total). CALL BACK IF: * You become worse CARE ADVICE given per COVID-19 - DIAGNOSED OR SUSPECTED (Adult) guideline. Comments User: Ruta Hinds, RN Date/Time Lamount Cohen Time): 12/22/2020 9:48:25 AM SPO2 97% User: Ruta Hinds, RN Date/Time Lamount Cohen Time): 12/22/2020 10:20:04 AM Caller made aware of MD response PLEASE NOTE: All timestamps contained within this report are represented as [place] Standard Time. CONFIDENTIALTY NOTICE: This fax transmission is intended only for the addressee. It contains information that is legally privileged, confidential or otherwise protected from use or disclosure. If you are not the intended recipient, you are strictly prohibited from reviewing, disclosing, copying using or disseminating any of this information or taking any action in reliance Wise or regarding this information. If you have received this fax in error, please notify us immediately by telephone so that we can arrange for its return to [place]. Phone: [phone], Toll-Free: [phone], Fax: [phone] Page: 3 of 3 Call Id: [id number] Paging DoctorName Phone DateTime Result/ Outcome Message Type Notes Roxy Manns - Butler [phone] 12/22/2020 9:53:21 AM Paged Wise Call Back to Call Center Doctor Paged This is Ebone the RN from team health please give me a call back at [phone] Roxy Manns - MD 12/22/2020 10:19:45 AM Spoke with Wise Call - General Message Result New order received: MD is sending a prescription for Molnupirovar to [facility] in [place]. Caller is to followup with PCP in the morning for update Wise condition.

## 2020-12-23 NOTE — Telephone Encounter (Signed)
Spoke to patient by telephone and a virtual visit was scheduled today 12/23/20 with Dr. Para March at 4:00 pm. Patient was given ER precautions and he verbalized understanding.

## 2020-12-23 NOTE — Progress Notes (Signed)
Virtual visit completed through WebEx or similar program Patient location: home  Provider location: [place] at [facility], office  Participants: Patient and me (unless stated otherwise below)  Pandemic considerations d/w pt.   Limitations and rationale for visit method d/w patient.  Patient agreed to proceed.   CC: covid.    HPI: wife had covid, then patient tested positive.  He tested positive 12/21/20.  Started on molnupiravir.  Not needing tylenol at this point.  Temp 97.6.  no pain.  Has occ productive cough and scratchy throat.  Not SOB.  He was prev achy but feels like he is improved in the meantime, aching less now.  Normal taste.  No vomiting, no diarrhea.  No ADE on antiviral.    Needs labs orders for upcoming CPE, d/w pt.   Meds and allergies reviewed.   ROS: Per HPI unless specifically indicated in ROS section   NAD Speech wnl  A/P: COVID.  Improved in the meantime.  Not short of breath.  Still okay for outpatient follow-up.  Continue molnupiravir.  Routine cautions given to patient.  Routine quarantine guidelines discussed with patient.  He will update me as needed.

## 2020-12-23 NOTE — Telephone Encounter (Signed)
Noted. Thanks.

## 2020-12-27 DIAGNOSIS — U071 COVID-19: Secondary | ICD-10-CM | POA: Insufficient documentation

## 2020-12-27 NOTE — Assessment & Plan Note (Signed)
  COVID.  Improved in the meantime.  Not short of breath.  Still okay for outpatient follow-up.  Continue molnupiravir.  Routine cautions given to patient.  Routine quarantine guidelines discussed with patient.  He will update me as needed.

## 2021-01-02 DIAGNOSIS — Z23 Encounter for immunization: Secondary | ICD-10-CM | POA: Diagnosis not present

## 2021-01-08 ENCOUNTER — Other Ambulatory Visit: Payer: Medicare Other

## 2021-01-08 ENCOUNTER — Other Ambulatory Visit (INDEPENDENT_AMBULATORY_CARE_PROVIDER_SITE_OTHER): Payer: Medicare Other

## 2021-01-08 ENCOUNTER — Other Ambulatory Visit: Payer: Self-pay

## 2021-01-08 DIAGNOSIS — D649 Anemia, unspecified: Secondary | ICD-10-CM

## 2021-01-08 DIAGNOSIS — E118 Type 2 diabetes mellitus with unspecified complications: Secondary | ICD-10-CM | POA: Diagnosis not present

## 2021-01-08 LAB — CBC WITH DIFFERENTIAL/PLATELET
Basophils Absolute: 0.1 10*3/uL (ref 0.0–0.1)
Basophils Relative: 1.9 % (ref 0.0–3.0)
Eosinophils Absolute: 0.1 10*3/uL (ref 0.0–0.7)
Eosinophils Relative: 2.7 % (ref 0.0–5.0)
HCT: 44.9 % (ref 39.0–52.0)
Hemoglobin: 15 g/dL (ref 13.0–17.0)
Lymphocytes Relative: 19.4 % (ref 12.0–46.0)
Lymphs Abs: 1 10*3/uL (ref 0.7–4.0)
MCHC: 33.4 g/dL (ref 30.0–36.0)
MCV: 84.6 fl (ref 78.0–100.0)
Monocytes Absolute: 0.8 10*3/uL (ref 0.1–1.0)
Monocytes Relative: 15.1 % — ABNORMAL HIGH (ref 3.0–12.0)
Neutro Abs: 3.1 10*3/uL (ref 1.4–7.7)
Neutrophils Relative %: 60.9 % (ref 43.0–77.0)
Platelets: 291 10*3/uL (ref 150.0–400.0)
RBC: 5.31 Mil/uL (ref 4.22–5.81)
RDW: 14.9 % (ref 11.5–15.5)
WBC: 5.1 10*3/uL (ref 4.0–10.5)

## 2021-01-08 LAB — COMPREHENSIVE METABOLIC PANEL
ALT: 40 U/L (ref 0–53)
AST: 29 U/L (ref 0–37)
Albumin: 4.8 g/dL (ref 3.5–5.2)
Alkaline Phosphatase: 42 U/L (ref 39–117)
BUN: 27 mg/dL — ABNORMAL HIGH (ref 6–23)
CO2: 29 mEq/L (ref 19–32)
Calcium: 9.9 mg/dL (ref 8.4–10.5)
Chloride: 101 mEq/L (ref 96–112)
Creatinine, Ser: 1.3 mg/dL (ref 0.40–1.50)
GFR: 52.84 mL/min — ABNORMAL LOW (ref >60.00)
Glucose, Bld: 137 mg/dL — ABNORMAL HIGH (ref 70–99)
Potassium: 4 mEq/L (ref 3.5–5.1)
Sodium: 137 mEq/L (ref 135–145)
Total Bilirubin: 0.7 mg/dL (ref 0.2–1.2)
Total Protein: 7.4 g/dL (ref 6.0–8.3)

## 2021-01-08 LAB — LIPID PANEL
Cholesterol: 112 mg/dL (ref 0–200)
HDL: 20.2 mg/dL — ABNORMAL LOW (ref >39.00)
NonHDL: 92.02
Total CHOL/HDL Ratio: 6
Triglycerides: 356 mg/dL — ABNORMAL HIGH (ref 0.0–149.0)
VLDL: 71.2 mg/dL — ABNORMAL HIGH (ref 0.0–40.0)

## 2021-01-08 LAB — IRON: Iron: 101 ug/dL (ref 42–165)

## 2021-01-08 LAB — HEMOGLOBIN A1C: Hgb A1c MFr Bld: 7.7 % — ABNORMAL HIGH (ref 4.6–6.5)

## 2021-01-08 LAB — LDL CHOLESTEROL, DIRECT: Direct LDL: 35 mg/dL

## 2021-01-08 LAB — TSH: TSH: 1.45 u[IU]/mL (ref 0.35–5.50)

## 2021-01-09 ENCOUNTER — Other Ambulatory Visit: Payer: Self-pay | Admitting: Family Medicine

## 2021-01-12 ENCOUNTER — Other Ambulatory Visit: Payer: Self-pay | Admitting: Family Medicine

## 2021-01-13 ENCOUNTER — Ambulatory Visit (INDEPENDENT_AMBULATORY_CARE_PROVIDER_SITE_OTHER): Payer: Medicare Other | Admitting: Family Medicine

## 2021-01-13 ENCOUNTER — Other Ambulatory Visit: Payer: Self-pay

## 2021-01-13 ENCOUNTER — Encounter: Payer: Self-pay | Admitting: Family Medicine

## 2021-01-13 VITALS — BP 122/62 | HR 68 | Temp 97.3°F | Ht 70.0 in | Wt 168.0 lb

## 2021-01-13 DIAGNOSIS — Z0000 Encounter for general adult medical examination without abnormal findings: Secondary | ICD-10-CM

## 2021-01-13 DIAGNOSIS — Z7189 Other specified counseling: Secondary | ICD-10-CM

## 2021-01-13 DIAGNOSIS — E118 Type 2 diabetes mellitus with unspecified complications: Secondary | ICD-10-CM | POA: Diagnosis not present

## 2021-01-13 DIAGNOSIS — I152 Hypertension secondary to endocrine disorders: Secondary | ICD-10-CM | POA: Diagnosis not present

## 2021-01-13 DIAGNOSIS — I2510 Atherosclerotic heart disease of native coronary artery without angina pectoris: Secondary | ICD-10-CM

## 2021-01-13 DIAGNOSIS — E785 Hyperlipidemia, unspecified: Secondary | ICD-10-CM

## 2021-01-13 DIAGNOSIS — E1159 Type 2 diabetes mellitus with other circulatory complications: Secondary | ICD-10-CM | POA: Diagnosis not present

## 2021-01-13 DIAGNOSIS — Z1211 Encounter for screening for malignant neoplasm of colon: Secondary | ICD-10-CM | POA: Diagnosis not present

## 2021-01-13 DIAGNOSIS — G4700 Insomnia, unspecified: Secondary | ICD-10-CM

## 2021-01-13 DIAGNOSIS — D649 Anemia, unspecified: Secondary | ICD-10-CM | POA: Diagnosis not present

## 2021-01-13 MED ORDER — AMLODIPINE BESYLATE 10 MG PO TABS: 10.0000 mg | ORAL_TABLET | Freq: Every day | ORAL | 3 refills | Status: DC

## 2021-01-13 MED ORDER — TRAZODONE HCL 100 MG PO TABS
100.0000 mg | ORAL_TABLET | Freq: Every day | ORAL | 3 refills | Status: DC
Start: 2021-01-13 — End: 2021-12-26

## 2021-01-13 MED ORDER — METFORMIN HCL 1000 MG PO TABS
1500.0000 mg | ORAL_TABLET | Freq: Every evening | ORAL | Status: DC
Start: 2021-01-13 — End: 2021-05-23

## 2021-01-13 MED ORDER — LISINOPRIL-HYDROCHLOROTHIAZIDE 20-25 MG PO TABS
1.0000 | ORAL_TABLET | Freq: Every day | ORAL | 3 refills | Status: DC
Start: 2021-01-13 — End: 2021-12-23

## 2021-01-13 NOTE — Patient Instructions (Addendum)
Stop iron for now.  If stool changes continue then update me vs ask for GI input.  We may need to change metformin.    I'll update Dr. Elvera Lennox and we'll go form there.  Take care.  Glad to see you.

## 2021-01-13 NOTE — Progress Notes (Signed)
This visit occurred during the SARS-CoV-2 public health emergency.  Safety protocols were in place, including screening questions prior to the visit, additional usage of staff PPE, and extensive cleaning of exam room while observing appropriate contact time as indicated for disinfecting solutions.  Tetanus 2016 Flu shot 2022 covid vaccine prev done.  PNA up to date.  Shingles 2020.  Advance directive discussed with patient.  Wife designated if patient were incapacitated. Colonoscopy prev done per patient report.  we talked about referral to GI, ordered.   Prostate cancer screening and PSA options (with potential risks and benefits of testing vs not testing) were discussed along with recent recs/guidelines.  He declined testing PSA at this point.  History of anemia.  Not anemic now.  No black or bloody stools.  Labs d/w pt.  Discussed with patient about stopping iron for now.  He had some looser stools and if this continues we can consider having the patient see GI or we could consider stopping or changing metformin.  Trazodone helps with insomnia.  No ADE on med.  100mg  helps.  Dw pt.  Med list updated and rx sent.   Elevated Cholesterol: Using medications without problems: yes Muscle aches: no Diet compliance: yes Exercise: yes Labs dw pt.    Hypertension:    Using medication without problems or lightheadedness: yes Chest pain with exertion:no Edema:no Short of breath:no  Diabetes:  Using medications without difficulties: yes Hypoglycemic episodes: no Hyperglycemic episodes: no Feet problems: no Blood Sugars averaging: max ~130s in the AM, usually ~100.   eye exam within last year: yes  Meds, vitals, and allergies reviewed.   PMH and SH reviewed  ROS: Per HPI unless specifically indicated in ROS section   GEN: nad, alert and oriented HEENT: ncat NECK: supple w/o LA CV: rrr. PULM: ctab, no inc wob ABD: soft, +bs EXT: no edema SKIN: Well-perfused.  Diabetic foot  exam: Normal inspection No skin breakdown No calluses  Normal DP pulses Normal sensation to light touch and monofilament Nails normal

## 2021-01-15 ENCOUNTER — Other Ambulatory Visit: Payer: Self-pay | Admitting: Family Medicine

## 2021-01-15 MED ORDER — ZINC OXIDE 15 MG PO TBDP: ORAL_TABLET | ORAL | Status: DC

## 2021-01-15 NOTE — Assessment & Plan Note (Signed)
-   Continue atorvastatin and fenofibrate 

## 2021-01-15 NOTE — Assessment & Plan Note (Signed)
Continue trazodone.  He will update me as needed.

## 2021-01-15 NOTE — Assessment & Plan Note (Signed)
Continue carvedilol amlodipine lisinopril hydrochlorothiazide.

## 2021-01-15 NOTE — Assessment & Plan Note (Signed)
Advance directive discussed with patient.  Wife designated if patient were incapacitated. 

## 2021-01-15 NOTE — Assessment & Plan Note (Signed)
  Tetanus 2016 Flu shot 2022 covid vaccine prev done.  PNA up to date.  Shingles 2020.  Advance directive discussed with patient.  Wife designated if patient were incapacitated. Colonoscopy prev done per patient report.  we talked about referral to GI, ordered.   Prostate cancer screening and PSA options (with potential risks and benefits of testing vs not testing) were discussed along with recent recs/guidelines.  He declined testing PSA at this point.

## 2021-01-15 NOTE — Assessment & Plan Note (Signed)
A1c slightly elevated compared to previous.  I will update Dr. Elvera Lennox with endocrinology.  No change in medications at this point.  If he has persistent stool changes we may need to adjust his metformin.  Continue Farxiga, glimepiride, metformin, insulin for now.

## 2021-01-15 NOTE — Assessment & Plan Note (Addendum)
Resolved.  Stop iron.  Discussed with patient.

## 2021-01-17 ENCOUNTER — Other Ambulatory Visit: Payer: Self-pay | Admitting: Physician Assistant

## 2021-01-25 ENCOUNTER — Other Ambulatory Visit: Payer: Self-pay | Admitting: Family Medicine

## 2021-01-28 ENCOUNTER — Telehealth: Payer: Self-pay | Admitting: Family Medicine

## 2021-01-28 MED ORDER — FISH OIL 1000 MG PO CAPS: 1000.0000 mg | ORAL_CAPSULE | Freq: Two times a day (BID) | ORAL | 0 refills | Status: DC

## 2021-01-28 NOTE — Telephone Encounter (Signed)
Spoke with patient and he is not currently taking fish oil as he was told to stop taking it. He tolerated it fine and is fine to restart taking it. Patient has been advised directions to restart by taking one tab QD then gradually inc to 2 tabs BID if tolerated. Patient verbalized understanding and will do so. Advised to call if he has any problems.

## 2021-01-28 NOTE — Telephone Encounter (Signed)
Please check with patient re: tx for TG elevation.  Has he been taking fish oil?  Can he tolerate it?  If not yet taking but willing to try, then start with 1 tab a day and gradually inc to 2 tabs BID if tolerated.  I had checked with Dr. Elvera Lennox about this.  Thanks.

## 2021-03-05 ENCOUNTER — Ambulatory Visit: Payer: Medicare Other | Admitting: Gastroenterology

## 2021-03-08 ENCOUNTER — Encounter: Payer: Self-pay | Admitting: Gastroenterology

## 2021-03-08 ENCOUNTER — Encounter: Payer: Self-pay | Admitting: Internal Medicine

## 2021-03-11 ENCOUNTER — Encounter: Payer: Self-pay | Admitting: Gastroenterology

## 2021-03-11 ENCOUNTER — Other Ambulatory Visit: Payer: Self-pay

## 2021-03-11 ENCOUNTER — Ambulatory Visit (INDEPENDENT_AMBULATORY_CARE_PROVIDER_SITE_OTHER): Payer: Self-pay | Admitting: Gastroenterology

## 2021-03-11 ENCOUNTER — Telehealth: Payer: Self-pay | Admitting: Gastroenterology

## 2021-03-11 VITALS — BP 192/85 | HR 64 | Temp 98.1°F | Ht 69.5 in | Wt 174.0 lb

## 2021-03-11 DIAGNOSIS — Z86010 Personal history of colonic polyps: Secondary | ICD-10-CM

## 2021-03-11 NOTE — Progress Notes (Signed)
C-

## 2021-03-11 NOTE — Telephone Encounter (Signed)
Inbound call from pt's wife requesting a call back stating she has questions about pt's appt. Thank you.

## 2021-03-12 ENCOUNTER — Ambulatory Visit: Payer: Medicare Other | Admitting: Gastroenterology

## 2021-03-12 NOTE — Telephone Encounter (Signed)
I spoke to wife and answered questions also confirmed date for procedure.Marland KitchenMarland Kitchen

## 2021-03-12 NOTE — Telephone Encounter (Signed)
Left message on voicemail.

## 2021-03-18 ENCOUNTER — Other Ambulatory Visit: Payer: Self-pay | Admitting: Internal Medicine

## 2021-03-31 ENCOUNTER — Encounter: Payer: Self-pay | Admitting: Gastroenterology

## 2021-04-01 ENCOUNTER — Encounter: Payer: Self-pay | Admitting: Gastroenterology

## 2021-04-01 ENCOUNTER — Ambulatory Visit: Payer: Medicare Other | Admitting: Certified Registered Nurse Anesthetist

## 2021-04-01 ENCOUNTER — Other Ambulatory Visit: Payer: Self-pay

## 2021-04-01 ENCOUNTER — Ambulatory Visit
Admission: RE | Admit: 2021-04-01 | Discharge: 2021-04-01 | Disposition: A | Discharge status: Home / Self Care | Payer: Medicare Other | Attending: Gastroenterology | Admitting: Gastroenterology

## 2021-04-01 ENCOUNTER — Encounter
Admission: RE | Disposition: A | Discharge status: Home / Self Care | Payer: Self-pay | Source: Home / Self Care | Attending: Gastroenterology

## 2021-04-01 DIAGNOSIS — I10 Essential (primary) hypertension: Secondary | ICD-10-CM | POA: Insufficient documentation

## 2021-04-01 DIAGNOSIS — I2510 Atherosclerotic heart disease of native coronary artery without angina pectoris: Secondary | ICD-10-CM | POA: Diagnosis not present

## 2021-04-01 DIAGNOSIS — K648 Other hemorrhoids: Secondary | ICD-10-CM | POA: Insufficient documentation

## 2021-04-01 DIAGNOSIS — K5730 Diverticulosis of large intestine without perforation or abscess without bleeding: Secondary | ICD-10-CM | POA: Diagnosis not present

## 2021-04-01 DIAGNOSIS — Z860100 Personal history of colon polyps, unspecified: Secondary | ICD-10-CM

## 2021-04-01 DIAGNOSIS — Z1211 Encounter for screening for malignant neoplasm of colon: Secondary | ICD-10-CM | POA: Insufficient documentation

## 2021-04-01 DIAGNOSIS — Z951 Presence of aortocoronary bypass graft: Secondary | ICD-10-CM | POA: Diagnosis not present

## 2021-04-01 DIAGNOSIS — E785 Hyperlipidemia, unspecified: Secondary | ICD-10-CM | POA: Insufficient documentation

## 2021-04-01 DIAGNOSIS — E119 Type 2 diabetes mellitus without complications: Secondary | ICD-10-CM | POA: Insufficient documentation

## 2021-04-01 DIAGNOSIS — Z87891 Personal history of nicotine dependence: Secondary | ICD-10-CM | POA: Insufficient documentation

## 2021-04-01 HISTORY — PX: COLONOSCOPY WITH PROPOFOL: SHX5780

## 2021-04-01 LAB — GLUCOSE, CAPILLARY
Glucose-Capillary: 140 mg/dL — ABNORMAL HIGH (ref 70–99)
Glucose-Capillary: 155 mg/dL — ABNORMAL HIGH (ref 70–99)

## 2021-04-01 SURGERY — COLONOSCOPY WITH PROPOFOL
Anesthesia: General

## 2021-04-01 MED ORDER — PROPOFOL 10 MG/ML IV BOLUS
INTRAVENOUS | Status: DC | PRN
  Administered 2021-04-01: 90 mg via INTRAVENOUS

## 2021-04-01 MED ORDER — LIDOCAINE HCL (CARDIAC) PF 100 MG/5ML IV SOSY
PREFILLED_SYRINGE | INTRAVENOUS | Status: DC | PRN
  Administered 2021-04-01: 50 mg via INTRAVENOUS

## 2021-04-01 MED ORDER — PROPOFOL 500 MG/50ML IV EMUL
INTRAVENOUS | Status: DC | PRN
  Administered 2021-04-01: 80 ug/kg/min via INTRAVENOUS

## 2021-04-01 MED ORDER — SODIUM CHLORIDE 0.9 % IV SOLN: INTRAVENOUS | Status: DC

## 2021-04-01 MED ORDER — LABETALOL HCL 5 MG/ML IV SOLN
INTRAVENOUS | Status: DC | PRN
  Administered 2021-04-01: 5 mg via INTRAVENOUS

## 2021-04-01 NOTE — Anesthesia Postprocedure Evaluation (Signed)
Anesthesia Post Note  Patient: Luis Wise  Procedure(s) Performed: COLONOSCOPY WITH PROPOFOL  Patient location during evaluation: Endoscopy Anesthesia Type: General Level of consciousness: awake and alert Pain management: pain level controlled Vital Signs Assessment: post-procedure vital signs reviewed and stable Respiratory status: spontaneous breathing, nonlabored ventilation, respiratory function stable and patient connected to nasal cannula oxygen Cardiovascular status: blood pressure returned to baseline and stable Postop Assessment: no apparent nausea or vomiting Anesthetic complications: no   No notable events documented.   Last Vitals:  Vitals:   04/01/21 1015 04/01/21 1020  BP: 108/64 118/69  Pulse: (!) 56 (!) 56  Resp: 14 15  Temp:    SpO2: 98% 97%    Last Pain:  Vitals:   04/01/21 0923  TempSrc: Temporal  PainSc: 0-No pain                 Corinda Gubler

## 2021-04-01 NOTE — Op Note (Signed)
[facility] Gastroenterology Patient Name: Luis Wise Procedure Date: 04/01/2021 9:35 AM MRN: [id number] Account #: [id number] Date of Birth: Dec 02, 1940 Admit Type: Outpatient Age: 81 Room: [facility] ENDO ROOM 4 Gender: Male Note Status: Finalized Instrument Name: Prentice Docker [id number] Procedure:             Colonoscopy Indications:           High risk colon cancer surveillance: Personal history                         of colonic polyps Providers:             Luis Minium MD, MD Medicines:             Propofol per Anesthesia Complications:         No immediate complications. Procedure:             Pre-Anesthesia Assessment:                        - Prior to the procedure, a History and Physical was                         performed, and patient medications and allergies were                         reviewed. The patient's tolerance of previous                         anesthesia was also reviewed. The risks and benefits                         of the procedure and the sedation options and risks                         were discussed with the patient. All questions were                         answered, and informed consent was obtained. Prior                         Anticoagulants: The patient has taken no previous                         anticoagulant or antiplatelet agents. ASA Grade                         Assessment: II - A patient with mild systemic disease.                         After reviewing the risks and benefits, the patient                         was deemed in satisfactory condition to undergo the                         procedure.                        After obtaining informed consent, the colonoscope was  passed under direct vision. Throughout the procedure,                         the patient's blood pressure, pulse, and oxygen                         saturations were monitored continuously. The                          Colonoscope was introduced through the anus and                         advanced to the the cecum, identified by appendiceal                         orifice and ileocecal valve. The colonoscopy was                         performed without difficulty. The patient tolerated                         the procedure well. The quality of the bowel                         preparation was excellent. Findings:      The perianal and digital rectal examinations were normal.      Multiple small-mouthed diverticula were found in the entire colon.      Non-bleeding internal hemorrhoids were found during retroflexion. The       hemorrhoids were Grade II (internal hemorrhoids that prolapse but reduce       spontaneously). Impression:            - Diverticulosis in the entire examined colon.                        - Non-bleeding internal hemorrhoids.                        - No specimens collected. Recommendation:        - Discharge patient to home.                        - Resume previous diet.                        - Repeat colonoscopy is not recommended for                         surveillance. Procedure Code(s):     --- Professional ---                        [phone], Colonoscopy, flexible; diagnostic, including                         collection of specimen(s) by brushing or washing, when                         performed (separate procedure) Diagnosis Code(s):     --- Professional ---  Z86.010, Personal history of colonic polyps CPT copyright 2019 [employer] Medical Association. All rights reserved. The codes documented in this report are preliminary and upon coder review may  be revised to meet current compliance requirements. Luis Miniumarren Brittnie Lewey MD, MD 04/01/2021 10:10:30 AM This report has been signed electronically. Number of Addenda: 0 Note Initiated On: 04/01/2021 9:35 AM Scope Withdrawal Time: 0 hours 8 minutes 3 seconds  Total Procedure Duration: 0 hours 11 minutes 51 seconds   Estimated Blood Loss:  Estimated blood loss: none.      [facility]

## 2021-04-01 NOTE — Transfer of Care (Signed)
Immediate Anesthesia Transfer of Care Note  Patient: Luis Wise  Procedure(s) Performed: COLONOSCOPY WITH PROPOFOL  Patient Location: PACU  Anesthesia Type:General  Level of Consciousness: awake, drowsy and patient cooperative  Airway & Oxygen Therapy: Patient Spontanous Breathing  Post-op Assessment: Report given to RN and Post -op Vital signs reviewed and stable  Post vital signs: Reviewed and stable; Temp on forehead 36 C  Last Vitals:  Vitals Value Taken Time  BP 108/64 04/01/21 1015  Temp    Pulse 56 04/01/21 1016  Resp 15 04/01/21 1016  SpO2 98 % 04/01/21 1016  Vitals shown include unvalidated device data.  Last Pain:  Vitals:   04/01/21 0923  TempSrc: Temporal  PainSc: 0-No pain         Complications: No notable events documented.

## 2021-04-01 NOTE — H&P (Signed)
Midge Minium, MD [facility] [address], [address] [place], [place] [address] [phone] Fax : [phone]  Primary Care Physician:  Joaquim Nam, MD Primary Gastroenterologist:  Dr. Servando Snare  Pre-Procedure History & Physical: HPI:  Luis Wise is a 81 y.o. male is here for an colonoscopy.   Past Medical History:  Diagnosis Date   Allergy    Amoxicillin   Cataract 2017   Mild progression   Coronary artery disease    a. s/p 5-vessel CABG (LIMA-LAD, SVG-diag, sequential SVG-OM1-OM 2, & SVG-PDA)   Diabetes mellitus with complication (HCC)    Hyperlipidemia    Hypertension    Insomnia    neuropathy    bilateral feet   Postoperative atrial fibrillation [facility])     Past Surgical History:  Procedure Laterality Date   BACK SURGERY     CARDIAC CATHETERIZATION     01/19/2020   CORONARY ARTERY BYPASS GRAFT N/A 01/29/2020   Procedure: CORONARY ARTERY BYPASS GRAFTING (CABG) TIMES FIVE USING LIMA to LAD; ENDOSCOPIC HARVESTED GREATER SAPHENOUS VEIN: SVG to PDA; SVG to sequenced OM1 & OM2.;  Surgeon: Kerin Perna, MD;  Location: [facility] OR;  Service: Open Heart Surgery;  Laterality: N/A;   ENDOVEIN HARVEST OF GREATER SAPHENOUS VEIN Bilateral 01/29/2020   Procedure: ENDOVEIN HARVEST OF GREATER SAPHENOUS VEIN;  Surgeon: Kerin Perna, MD;  Location: [facility] OR;  Service: Open Heart Surgery;  Laterality: Bilateral;   EYE SURGERY  Radial K   1997   LAMINECTOMY  1993   L5-S1   LEFT HEART CATH AND CORONARY ANGIOGRAPHY N/A 01/19/2020   Procedure: LEFT HEART CATH AND CORONARY ANGIOGRAPHY;  Surgeon: Iran Ouch, MD;  Location: [facility] INVASIVE CV LAB;  Service: Cardiovascular;  Laterality: N/A;   MENISCUS REPAIR  1996   TEE WITHOUT CARDIOVERSION N/A 01/29/2020   Procedure: TRANSESOPHAGEAL ECHOCARDIOGRAM (TEE);  Surgeon: Donata Clay, Theron Arista, MD;  Location: [facility] OR;  Service: Open Heart Surgery;  Laterality: N/A;   TONSILLECTOMY      Prior to Admission medications   Medication Sig Start Date  End Date Taking? Authorizing Provider  amLODipine (NORVASC) 10 MG tablet Take 1 tablet (10 mg total) by mouth daily. 01/13/21  Yes Joaquim Nam, MD  atorvastatin (LIPITOR) 80 MG tablet TAKE 1 TABLET BY MOUTH EVERY DAY 01/17/21  Yes Gollan, Tollie Pizza, MD  carvedilol (COREG) 12.5 MG tablet TAKE 1 TABLET BY MOUTH 2 TIMES DAILY. 01/17/21  Yes Gollan, Tollie Pizza, MD  cholecalciferol (VITAMIN D3) 25 MCG (1000 UNIT) tablet Take 1,000 Units by mouth daily.   Yes [provider]  Coenzyme Q10 (COQ10) 100 MG CAPS Take 100 mg by mouth daily.    Yes [provider]  Continuous Blood Gluc Sensor (FREESTYLE LIBRE 2 SENSOR) MISC 1 each by Does not apply route every 14 (fourteen) days. 11/27/20  Yes Carlus Pavlov, MD  dapagliflozin propanediol (FARXIGA) 10 MG TABS tablet Take 1 tablet (10 mg total) by mouth daily before breakfast. 08/29/20  Yes Carlus Pavlov, MD  fenofibrate 160 MG tablet TAKE 1 TABLET BY MOUTH EVERY DAY 01/10/21  Yes Joaquim Nam, MD  glimepiride (AMARYL) 4 MG tablet TAKE 1 TABLET BY MOUTH EVERY DAY WITH BREAKFAST 03/18/21  Yes Carlus Pavlov, MD  insulin degludec (TRESIBA FLEXTOUCH) 200 UNIT/ML FlexTouch Pen Inject 30 Units into the skin daily. 08/29/20  Yes Carlus Pavlov, MD  lisinopril (ZESTRIL) 20 MG tablet Take 1 tablet (20 mg total) by mouth daily. 09/25/20  Yes Antonieta Iba, MD  lisinopril-hydrochlorothiazide (ZESTORETIC) 20-25 MG tablet Take 1 tablet by mouth daily. 01/13/21  Yes Joaquim Nam, MD  metFORMIN (GLUCOPHAGE) 1000 MG tablet Take 1.5 tablets (1,500 mg total) by mouth at bedtime. 01/13/21  Yes Joaquim Nam, MD  Multiple Vitamins-Minerals (PRESERVISION AREDS 2 PO) Take 1 tablet by mouth 2 (two) times daily.    Yes [provider]  Omega-3 Fatty Acids (FISH OIL) 1000 MG CAPS Take 1-2 capsules (1,000-2,000 mg total) by mouth in the morning and at bedtime. 01/28/21  Yes Joaquim Nam, MD  traZODone (DESYREL) 100 MG tablet Take 1  tablet (100 mg total) by mouth at bedtime. 01/13/21  Yes Joaquim Nam, MD  Zinc Oxide 15 MG TBDP 2 tabs per day 01/15/21  Yes Joaquim Nam, MD  glucose blood [facility] ULTRA) test strip TEST 1-2x a day 03/11/20   Carlus Pavlov, MD  Insulin Pen Needle 32G X 4 MM MISC Use 1x a day 07/01/20   Carlus Pavlov, MD  sildenafil (REVATIO) 20 MG tablet Take 20 mg by mouth as needed.    [provider]    Allergies as of 03/12/2021 - Review Complete 03/11/2021  Allergen Reaction Noted   Ozempic (0.25 or 0.5 mg-dose) [semaglutide(0.25 or 0.5mg -dos)]  02/27/2020   Amoxil [amoxicillin] Rash 10/25/2015    Family History  Problem Relation Age of Onset   Hypertension Mother    Diabetes Father    Cancer Neg Hx    COPD Neg Hx    Heart disease Neg Hx    Hyperlipidemia Neg Hx    Stroke Neg Hx    Colon cancer Neg Hx    Prostate cancer Neg Hx     Social History   Socioeconomic History   Marital status: Married    Spouse name: Not on file   Number of children: Not on file   Years of education: Not on file   Highest education level: Not on file  Occupational History   Occupation: CFO  Tobacco Use   Smoking status: Never   Smokeless tobacco: Never  Vaping Use   Vaping Use: Never used  Substance and Sexual Activity   Alcohol use: Not Currently    Alcohol/week: 0.0 standard drinks    Comment: Very rare- occasionaly beer or wine    Drug use: No   Sexual activity: Yes    Birth control/protection: None  Other Topics Concern   Not on file  Social History Narrative   Married 1970.   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.   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   Social Determinants of Health   Financial Resource Strain: Low Risk    Difficulty of Paying Living Expenses: Not hard at all  Food Insecurity: No Food Insecurity   Worried About Programme researcher, broadcasting/film/video in  the Last Year: Never true   Ran Out of Food in the Last Year: Never true  Transportation Needs: No Transportation Needs   Lack of Transportation (Medical): No   Lack of Transportation (Non-Medical): No  Physical Activity: Insufficiently Active   Days of Exercise per Week: 2 days   Minutes of Exercise per Session: 60 min  Stress: No Stress Concern Present   Feeling of Stress : Not at all  Social Connections: Socially Integrated   Frequency of Communication with Friends and Family: More than three times a week   Frequency of Social Gatherings  with Friends and Family: Never   Attends Religious Services: More than 4 times per year   Active Member of Clubs or Organizations: Yes   Attends Engineer, structuralClub or Organization Meetings: More than 4 times per year   Marital Status: Married  Catering managerntimate Partner Violence: Not At Risk   Fear of Current or Ex-Partner: No   Emotionally Abused: No   Physically Abused: No   Sexually Abused: No    Review of Systems: See HPI, otherwise negative ROS  Physical Exam: BP (!) 174/75    Pulse 61    Temp (!) 97.5 F (36.4 C) (Temporal)    Resp 16    Ht 5\' 9"  (1.753 m)    Wt 73.5 kg    SpO2 100%    BMI 23.92 kg/m  General:   Alert,  pleasant and cooperative in NAD Head:  Normocephalic and atraumatic. Neck:  Supple; no masses or thyromegaly. Lungs:  Clear throughout to auscultation.    Heart:  Regular rate and rhythm. Abdomen:  Soft, nontender and nondistended. Normal bowel sounds, without guarding, and without rebound.   Neurologic:  Alert and  oriented x4;  grossly normal neurologically.  Impression/Plan: Luis Wise is here for an colonoscopy to be performed for a history of adenomatous polyps on 2013  Risks, benefits, limitations, and alternatives regarding  colonoscopy have been reviewed with the patient.  Questions have been answered.  All parties agreeable.   Midge Miniumarren Teegan Brandis, MD  04/01/2021, 9:40 AM

## 2021-04-01 NOTE — Anesthesia Preprocedure Evaluation (Signed)
Anesthesia Evaluation  Patient identified by MRN, date of birth, ID band Patient awake    Reviewed: Allergy & Precautions, NPO status , Patient's Chart, lab work & pertinent test results  History of Anesthesia Complications Negative for: history of anesthetic complications  Airway Mallampati: II  TM Distance: >3 FB Neck ROM: Full    Dental no notable dental hx. (+) Teeth Intact   Pulmonary neg pulmonary ROS, neg sleep apnea, neg COPD, Patient abstained from smoking.Not current smoker,    Pulmonary exam normal breath sounds clear to auscultation       Cardiovascular Exercise Tolerance: Good METS: > 9 Mets hypertension, (-) angina+ CAD and + CABG  (-) Past MI (-) dysrhythmias  Rhythm:Regular Rate:Normal - Systolic murmurs    Neuro/Psych negative neurological ROS  negative psych ROS   GI/Hepatic neg GERD  ,(+)     (-) substance abuse  ,   Endo/Other  diabetes  Renal/GU negative Renal ROS     Musculoskeletal   Abdominal   Peds  Hematology   Anesthesia Other Findings Past Medical History: No date: Allergy     Comment:  Amoxicillin 2017: Cataract     Comment:  Mild progression No date: Coronary artery disease     Comment:  a. s/p 5-vessel CABG (LIMA-LAD, SVG-diag, sequential               SVG-OM1-OM 2, & SVG-PDA) No date: Diabetes mellitus with complication (HCC) No date: Hyperlipidemia No date: Hypertension No date: Insomnia No date: neuropathy     Comment:  bilateral feet No date: Postoperative atrial fibrillation (HCC)  Reproductive/Obstetrics                             Anesthesia Physical Anesthesia Plan  ASA: 2  Anesthesia Plan: General   Post-op Pain Management: Minimal or no pain anticipated   Induction: Intravenous  PONV Risk Score and Plan: 2 and Propofol infusion, TIVA and Ondansetron  Airway Management Planned: Nasal Cannula  Additional Equipment:  None  Intra-op Plan:   Post-operative Plan:   Informed Consent: I have reviewed the patients History and Physical, chart, labs and discussed the procedure including the risks, benefits and alternatives for the proposed anesthesia with the patient or authorized representative who has indicated his/her understanding and acceptance.     Dental advisory given  Plan Discussed with: CRNA and Surgeon  Anesthesia Plan Comments: (Discussed risks of anesthesia with patient, including possibility of difficulty with spontaneous ventilation under anesthesia necessitating airway intervention, PONV, and rare risks such as cardiac or respiratory or neurological events, and allergic reactions. Discussed the role of CRNA in patient's perioperative care. Patient understands.)        Anesthesia Quick Evaluation

## 2021-04-02 ENCOUNTER — Other Ambulatory Visit: Payer: Self-pay | Admitting: Internal Medicine

## 2021-04-02 ENCOUNTER — Encounter: Payer: Self-pay | Admitting: Gastroenterology

## 2021-04-02 ENCOUNTER — Other Ambulatory Visit: Payer: Self-pay | Admitting: Cardiovascular Disease

## 2021-04-02 DIAGNOSIS — E119 Type 2 diabetes mellitus without complications: Secondary | ICD-10-CM

## 2021-04-02 NOTE — Telephone Encounter (Signed)
Called patient to confirm which lisinopril how he is taking.   Patients medication lists  Lisinopril & Lisinopril-HCTZ

## 2021-04-03 ENCOUNTER — Encounter: Payer: Self-pay | Admitting: Internal Medicine

## 2021-04-03 ENCOUNTER — Other Ambulatory Visit: Payer: Self-pay

## 2021-04-03 ENCOUNTER — Ambulatory Visit (INDEPENDENT_AMBULATORY_CARE_PROVIDER_SITE_OTHER): Payer: Medicare Other | Admitting: Internal Medicine

## 2021-04-03 VITALS — BP 140/82 | HR 60 | Ht 69.0 in | Wt 175.2 lb

## 2021-04-03 DIAGNOSIS — E785 Hyperlipidemia, unspecified: Secondary | ICD-10-CM

## 2021-04-03 DIAGNOSIS — E1165 Type 2 diabetes mellitus with hyperglycemia: Secondary | ICD-10-CM | POA: Diagnosis not present

## 2021-04-03 DIAGNOSIS — E1159 Type 2 diabetes mellitus with other circulatory complications: Secondary | ICD-10-CM

## 2021-04-03 DIAGNOSIS — E119 Type 2 diabetes mellitus without complications: Secondary | ICD-10-CM | POA: Diagnosis not present

## 2021-04-03 LAB — POCT GLYCOSYLATED HEMOGLOBIN (HGB A1C): Hemoglobin A1C: 7.5 % — AB (ref 4.0–5.6)

## 2021-04-03 MED ORDER — FREESTYLE LIBRE 2 READER DEVI: 1.0000 | Freq: Every day | 0 refills | Status: DC

## 2021-04-03 MED ORDER — FREESTYLE LIBRE 3 SENSOR MISC: 1.0000 | 3 refills | Status: DC

## 2021-04-03 MED ORDER — ONETOUCH ULTRA VI STRP: ORAL_STRIP | 3 refills | Status: DC

## 2021-04-03 MED ORDER — FREESTYLE LIBRE 2 SENSOR MISC: 1.0000 | 3 refills | Status: DC

## 2021-04-03 NOTE — Progress Notes (Addendum)
Patient ID: Luis BarrJohn C Wise, male   DOB: 10/29/1940, 81 y.o.   MRN: [id number]   This visit occurred during the SARS-CoV-2 public health emergency.  Safety protocols were in place, including screening questions prior to the visit, additional usage of staff PPE, and extensive cleaning of exam room while observing appropriate contact time as indicated for disinfecting solutions.   HPI: Luis Wise is a 81 y.o.-year-old male, initially referred by his PCP, Dr. Para Marchuncan, returning for follow-up: DM2, dx in 2004-2005, insulin-dependent, uncontrolled, with complications (CAD - s/p CABG x5, CKD).  He is here with his wife, who is a former Nurse, learning disabilityICU nurse and offers part of the history especially related to medications, blood sugars, and diet.  Last visit 4 months ago.  Interim history: No increased urination, blurry vision, nausea, chest pain. He had a colonoscopy 04/01/2021 >> clear.  Reviewed HbA1c levels: Lab Results  Component Value Date   HGBA1C 7.7 (H) 01/08/2021   HGBA1C 7.2 (A) 11/27/2020   HGBA1C 7.5 (A) 08/29/2020   HGBA1C 8.4 (A) 07/01/2020   HGBA1C 6.9 (A) 03/11/2020   HGBA1C 7.0 (H) 01/26/2020   HGBA1C 7.0 (H) 01/02/2020   HGBA1C 6.7 (A) 07/18/2019   HGBA1C 6.5 (A) 12/30/2018   HGBA1C 7.0 05/19/2018   Pt is on a regimen of: - Metformin 500 mg in am and 1000 mg in pm >> 1500 mg at dinnertime - Glimepiride 4-6 >> 4 mg before breakfast and 2-4 mg before a large dinner - Farxiga 5 >> 10 mg before breakfast-added 03/2020 - Tresiba 10 >> 26 >> 30 >> 32 >> 30 units daily-added 06/2020 She was also tried on Rybelsus but this was expensive.  He then tried Ozempic but he developed nausea and vomiting and then increase lipase on 02/27/2020 (82  >> 119) on the 0.5 mg dose.  Ozempic was stopped at that time. Prev. On Januvia >> tolerated well but expensive.  Pt checks his sugars once a day: - am:  129, 153-215, 235 >> 119-170, 183 >> 73-118, 142, 166 >> 118-157, 187, 200 - 2h after b'fast: n/c >>  183-251 >> n/c - before lunch: n/c >> 157, 271 >> 182-219 >> 256 >> n/c   - 2h after lunch: n/c >> 169 >> 95, 98, 205  - before dinner: 173-239, 347 (milkshake) >> 124-209, 278 >> 122-163 (snack) >> 106 - 2h after dinner: n/c >> 166, 181 >> n/c >> 146, 184 >> n/c - bedtime: n/c >> 161 >> 189, 211 >> n/c - nighttime: n/c >> 135 >> n/c Lowest sugar was 50s before sx >> 129 >> 119 >> 73 >> 82; he has hypoglycemia awareness in the 60s.  Highest sugar was 347 (milkshake) >> 278 >> 200s >> 200  Glucometer: OneTouch ultra  Pt's meals are: - Breakfast: 1 egg + toast or cereal or oatmeal - Lunch: grilled fish + veggies - Dinner: meat + veggies - Snacks: 2: nuts, wheat crackers He walks 3 miles 3 days a week for exercise.  He finished cardiac rehab.   -No CKD, last BUN/creatinine:  Lab Results  Component Value Date   BUN 27 (H) 01/08/2021   BUN 29 (H) 05/10/2020   CREATININE 1.30 01/08/2021   CREATININE 1.29 05/10/2020  On lisinopril 20 twice a day.  -+ HL; last set of lipids: Lab Results  Component Value Date   CHOL 112 01/08/2021   HDL 20.20 (L) 01/08/2021   LDLCALC 66 04/11/2020   LDLDIRECT 35.0 01/08/2021   TRIG 356.0 (  H) 01/08/2021   CHOLHDL 6 01/08/2021  On atorvastatin 80 mg daily, fenofibrate 160, co-Q10  On ASA 81.  - last eye exam was in 05/2020: No DR reportedly  - no numbness and tingling in his feet.  Pt has FH of DM in father in his 8080s.  He was just taken off amiodarone.    ROS: + See HPI  I reviewed pt's medications, allergies, PMH, social hx, family hx, and changes were documented in the history of present illness. Otherwise, unchanged from my initial visit note.  Past Medical History:  Diagnosis Date   Allergy    Amoxicillin   Cataract 2017   Mild progression   Coronary artery disease    a. s/p 5-vessel CABG (LIMA-LAD, SVG-diag, sequential SVG-OM1-OM 2, & SVG-PDA)   Diabetes mellitus with complication (HCC)    Hyperlipidemia    Hypertension     Insomnia    neuropathy    bilateral feet   Postoperative atrial fibrillation [facility](HCC)    Past Surgical History:  Procedure Laterality Date   BACK SURGERY     CARDIAC CATHETERIZATION     01/19/2020   COLONOSCOPY WITH PROPOFOL N/A 04/01/2021   Procedure: COLONOSCOPY WITH PROPOFOL;  Surgeon: Midge MiniumWohl, Darren, MD;  Location: [facility] ENDOSCOPY;  Service: Endoscopy;  Laterality: N/A;   CORONARY ARTERY BYPASS GRAFT N/A 01/29/2020   Procedure: CORONARY ARTERY BYPASS GRAFTING (CABG) TIMES FIVE USING LIMA to LAD; ENDOSCOPIC HARVESTED GREATER SAPHENOUS VEIN: SVG to PDA; SVG to sequenced OM1 & OM2.;  Surgeon: Kerin PernaVan Trigt, Peter, MD;  [facility];  Service: Open Heart Surgery;  Laterality: N/A;   ENDOVEIN HARVEST OF GREATER SAPHENOUS VEIN Bilateral 01/29/2020   Procedure: ENDOVEIN HARVEST OF GREATER SAPHENOUS VEIN;  Surgeon: Kerin PernaVan Trigt, Peter, MD;  [facility];  Service: Open Heart Surgery;  Laterality: Bilateral;   EYE SURGERY  Radial K   1997   LAMINECTOMY  1993   L5-S1   LEFT HEART CATH AND CORONARY ANGIOGRAPHY N/A 01/19/2020   Procedure: LEFT HEART CATH AND CORONARY ANGIOGRAPHY;  Surgeon: Iran OuchArida, Muhammad A, MD;  Location: [facility] INVASIVE CV LAB;  Service: Cardiovascular;  Laterality: N/A;   MENISCUS REPAIR  1996   TEE WITHOUT CARDIOVERSION N/A 01/29/2020   Procedure: TRANSESOPHAGEAL ECHOCARDIOGRAM (TEE);  Surgeon: Donata ClayVan Trigt, Theron AristaPeter, MD;  [facility];  Service: Open Heart Surgery;  Laterality: N/A;   TONSILLECTOMY     Social History   Socioeconomic History   Marital status: Married    Spouse name: Not on file   Number of children: Not on file   Years of education: Not on file   Highest education level: Not on file  Occupational History   Occupation: CFO  Tobacco Use   Smoking status: Never   Smokeless tobacco: Never  Vaping Use   Vaping Use: Never used  Substance and Sexual Activity   Alcohol use: Not Currently    Alcohol/week: 0.0 standard drinks    Comment: Very rare- occasionaly beer  or wine    Drug use: No   Sexual activity: Yes    Birth control/protection: None  Other Topics Concern   Not on file  Social History Narrative   Married 1970.   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   Chief of StaffCommercial Instrument rated pilot, land and sea.     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   Social Determinants of Health   Financial Resource Strain: Low Risk    Difficulty of Paying Living Expenses:  Not hard at all  Food Insecurity: No Food Insecurity   Worried About Programme researcher, broadcasting/film/video in the Last Year: Never true   Ran Out of Food in the Last Year: Never true  Transportation Needs: No Transportation Needs   Lack of Transportation (Medical): No   Lack of Transportation (Non-Medical): No  Physical Activity: Insufficiently Active   Days of Exercise per Week: 2 days   Minutes of Exercise per Session: 60 min  Stress: No Stress Concern Present   Feeling of Stress : Not at all  Social Connections: Socially Integrated   Frequency of Communication with Friends and Family: More than three times a week   Frequency of Social Gatherings with Friends and Family: Never   Attends Religious Services: More than 4 times per year   Active Member of [employer] or Organizations: Yes   Attends Engineer, structural: More than 4 times per year   Marital Status: Married  Catering manager Violence: Not At Risk   Fear of Current or Ex-Partner: No   Emotionally Abused: No   Physically Abused: No   Sexually Abused: No   Current Outpatient Medications on File Prior to Visit  Medication Sig Dispense Refill   amLODipine (NORVASC) 10 MG tablet Take 1 tablet (10 mg total) by mouth daily. 90 tablet 3   atorvastatin (LIPITOR) 80 MG tablet TAKE 1 TABLET BY MOUTH EVERY DAY 90 tablet 2   carvedilol (COREG) 12.5 MG tablet TAKE 1 TABLET BY MOUTH 2 TIMES DAILY. 180 tablet 2   cholecalciferol (VITAMIN D3) 25 MCG (1000 UNIT) tablet Take 1,000 Units by  mouth daily.     Coenzyme Q10 (COQ10) 100 MG CAPS Take 100 mg by mouth daily.      Continuous Blood Gluc Sensor (FREESTYLE LIBRE 2 SENSOR) MISC 1 each by Does not apply route every 14 (fourteen) days. 6 each 3   dapagliflozin propanediol (FARXIGA) 10 MG TABS tablet Take 1 tablet (10 mg total) by mouth daily before breakfast. 90 tablet 3   fenofibrate 160 MG tablet TAKE 1 TABLET BY MOUTH EVERY DAY 90 tablet 1   glimepiride (AMARYL) 4 MG tablet TAKE 1 TABLET BY MOUTH EVERY DAY WITH BREAKFAST 90 tablet 0   insulin degludec (TRESIBA FLEXTOUCH) 200 UNIT/ML FlexTouch Pen Inject 30 Units into the skin daily. 12 mL 3   Insulin Pen Needle 32G X 4 MM MISC Use 1x a day 100 each 3   lisinopril (ZESTRIL) 20 MG tablet Take 1 tablet (20 mg total) by mouth daily. 90 tablet 2   lisinopril-hydrochlorothiazide (ZESTORETIC) 20-25 MG tablet Take 1 tablet by mouth daily. 90 tablet 3   metFORMIN (GLUCOPHAGE) 1000 MG tablet Take 1.5 tablets (1,500 mg total) by mouth at bedtime.     Multiple Vitamins-Minerals (PRESERVISION AREDS 2 PO) Take 1 tablet by mouth 2 (two) times daily.      Omega-3 Fatty Acids (FISH OIL) 1000 MG CAPS Take 1-2 capsules (1,000-2,000 mg total) by mouth in the morning and at bedtime.  0   ONETOUCH ULTRA test strip TEST 1-2X A DAY 200 strip 3   sildenafil (REVATIO) 20 MG tablet Take 20 mg by mouth as needed.     traZODone (DESYREL) 100 MG tablet Take 1 tablet (100 mg total) by mouth at bedtime. 90 tablet 3   Zinc Oxide 15 MG TBDP 2 tabs per day     No current facility-administered medications on file prior to visit.   Allergies  Allergen Reactions   Ozempic (  0.25 Or 0.5 Mg-Dose) [Semaglutide(0.25 Or 0.5mg -Dos)]     Presumed cause of GI upset.     Amoxil [Amoxicillin] Rash   Family History  Problem Relation Age of Onset   Hypertension Mother    Diabetes Father    Cancer Neg Hx    COPD Neg Hx    Heart disease Neg Hx    Hyperlipidemia Neg Hx    Stroke Neg Hx    Colon cancer Neg Hx     Prostate cancer Neg Hx    PE: There were no vitals taken for this visit. Wt Readings from Last 3 Encounters:  04/01/21 162 lb (73.5 kg)  03/11/21 174 lb (78.9 kg)  01/13/21 168 lb (76.2 kg)   Constitutional: normal weight, in NAD Eyes: PERRLA, EOMI, no exophthalmos ENT: moist mucous membranes, no thyromegaly, no cervical lymphadenopathy Cardiovascular: RRR, No MRG Respiratory: CTA B Musculoskeletal: no deformities, strength intact in all 4 Skin: moist, warm, no rashes Neurological: no tremor with outstretched hands, DTR normal in all 4  ASSESSMENT: 1. DM2, insulin-dependent, uncontrolled, with complications - CAD, s/p CABG x5 - CKD  Component     Latest Ref Rng & Units 08/29/2020          Glucose     65 - 99 mg/dL 893 (H)  Hemoglobin Y1O     4.0 - 5.6 % 7.5 (A)  C-Peptide     0.80 - 3.85 ng/mL 4.30 (H)  Islet Cell Ab     Neg:<1:1 Negative  ZNT8 Antibodies     <15 U/mL <10  Glutamic Acid Decarb Ab     <5 IU/mL <5  No insulin deficiency or pancreatic autoimmunity.  He has good insulin production.  2. HL  PLAN:  1. Patient with longstanding, uncontrolled, type 2 diabetes, on oral antidiabetic regimen with metformin, sulfonylurea, SGLT2 inhibitor to which we added a long-acting insulin last year.  We did not start a GLP-1 receptor agonist so far, due to previously elevated lipase.  At last visit, sugars were definitely improved from before, especially in the previous 2 months.  Most of his blood sugars were at goal in the morning and they were only occasionally slightly higher later in the day, especially before dinner, as he was usually checking after a snack.  Due to the significant improvement in his blood sugars we did not change his regimen.  HbA1c at that time was better, at 7.2%.  However, since then, he had another HbA1c 2.5 months ago and this was higher, at 7.7%. -At this visit, sugars appear to be slightly higher in the morning, with many values above target, and  some at goal.  Later in the day, he is not checking frequently in 1 month and whenever he checks they are variable.  As of now, I again advised him to obtain CGM.  I am not sure whether this will be covered by his insurance or not.  We sent the prescription for the freestyle libre 2 CGM to his pharmacy but I did advise him that if this is not covered through the pharmacy, he may need to go through a supplier.  I gave him a list of most common suppliers.  We also discussed that he can maybe obtain the libre CGM to the pharmacy, out-of-pocket.  In that case, I would recommend a freestyle libre 3 CGM.  I gave him a written prescription for this if they want to try it. -I suspect now, after the holidays, and especially if he  starts a CGM, his sugars will improve, so for now, I did not suggest changes in his medications. - I suggested to:  Patient Instructions  Please continue: - Metformin 1500 mg with dinner - Glimepiride 4 mg before breakfast and 2-4 mg before supper - Farxiga 10 mg before breakfast - Tresiba U200 30 units daily  The most common suppliers for the continuous glucose monitor are:  [employer] [employer]: [phone] [facility]: [phone] Ext [phone] [facility]: [phone] [facility]: [phone] [facility] Services: [phone] [facility]: [phone]  Please return in 3-4 months with your sugar log.   - we checked his HbA1c: 7.5% (slightly better) - advised to check sugars at different times of the day - 4x a day, rotating check times - advised for yearly eye exams >> he is UTD - return to clinic in 3-4 months  2. HL -Reviewed latest lipid panel from 01/2021: LDL at goal, decreased, triglycerides high, HDL low: Lab Results  Component Value Date   CHOL 112 01/08/2021   HDL 20.20 (L) 01/08/2021   LDLCALC 66 04/11/2020   LDLDIRECT 35.0 01/08/2021   TRIG 356.0 (H) 01/08/2021   CHOLHDL 6 01/08/2021  -He continues on atorvastatin 80 mg  daily and fenofibrate 160 mg daily without side effects  Carlus Pavlov, MD PhD [facility] Endocrinology

## 2021-04-03 NOTE — Patient Instructions (Addendum)
Please continue: - Metformin 1500 mg with dinner - Glimepiride 4 mg before breakfast and 2-4 mg before supper - Farxiga 10 mg before breakfast - Evaristo Bury U200 30 units daily  The most common suppliers for the continuous glucose monitor are:  [place] [employer]: [phone] [facility]: [phone] Ext [phone] [facility]: [phone] [facility]: [phone] [facility] Services: [phone] [facility]: [phone]  Please return in 3-4 months with your sugar log.

## 2021-04-03 NOTE — Telephone Encounter (Signed)
Patient takes lisinopril 20 MG at night and takes lisinopril and lisinopril HCTZ in the morning so he needs both prescriptions Please call with any further questions

## 2021-04-04 MED ORDER — FREESTYLE LIBRE 2 READER DEVI: 1.0000 | Freq: Every day | 0 refills | Status: DC

## 2021-04-04 MED ORDER — FREESTYLE LIBRE 2 SENSOR MISC: 1.0000 | 3 refills | Status: DC

## 2021-04-10 ENCOUNTER — Other Ambulatory Visit: Payer: Self-pay | Admitting: Internal Medicine

## 2021-04-10 DIAGNOSIS — E1159 Type 2 diabetes mellitus with other circulatory complications: Secondary | ICD-10-CM

## 2021-04-24 ENCOUNTER — Telehealth: Payer: Self-pay | Admitting: Dietician

## 2021-04-24 NOTE — Telephone Encounter (Signed)
Patient with referral to [facility] for [facility].  Called and spoke with patient. He states that he was able to obtain the [place] 2 and set this up on the reader as well as his phone.  He states that he wasted the first sensor as he set it up with the reader and then tried to use the phone.  Current sensor reading well. Reviewed with patient the need to scan at least every 8 hours, blood glucose goals, blood glucose rise with a meal, adjustments of his diet that could result in better glucose control.   Instructed patient to call the FreeStyle company to see if they would replace the damaged sensor.  He states that his wife is a Charity fundraiser and provides healthy dinners that are 15-30 grams of carbs with 1 vegetable and a lean protein.  No snacking at night.  All beverages have zero carbohydrate. Weight stable and WNL (69.5" and 165 lbs) Patient continues to walk for 3 miles 3-4 times per week. Chart reviewed and on a long acting insulin q HS (Tresiba U200 30 units q HS, glimepiride, Metformin, Farxiga.  Patient complains that the Josephine Igo is alarming for low blood glucose.  Confirmed to be accurate with a finger stick in the low 60's. Per patient request will message MD to evaluate for any medication changes.  [facility] number provided for any changes.  [name], RD, LDN, CDCES

## 2021-04-25 NOTE — Telephone Encounter (Signed)
Luis Wise, I already advised him to stop his glimepiride at night.  His sugars continue to decrease afterwards, we need to back off his [place] by about 6 units. Thank you, C

## 2021-04-25 NOTE — Telephone Encounter (Signed)
Called patient with further instructions to his medications related to night hypoglycemia.  Instructed patient to stop the Glimepiride at night (patient had been taking periodically) and decrease his Trisiba by 6 units (to 24 units) per Dr. Charlean Sanfilippo instructions.  Patient was able to verbalize and had no further questions.  [name], RD, LDN, CDCES

## 2021-05-02 ENCOUNTER — Other Ambulatory Visit: Payer: Self-pay | Admitting: Internal Medicine

## 2021-05-02 MED ORDER — INSULIN PEN NEEDLE 32G X 4 MM MISC: 3 refills | Status: AC

## 2021-05-02 MED ORDER — INSULIN LISPRO (1 UNIT DIAL) 100 UNIT/ML (KWIKPEN): 5.0000 [IU] | PEN_INJECTOR | Freq: Three times a day (TID) | SUBCUTANEOUS | 11 refills | Status: DC

## 2021-05-12 ENCOUNTER — Telehealth: Payer: Self-pay

## 2021-05-12 NOTE — Telephone Encounter (Signed)
This message is being sent by Otho Darner on behalf of Luis Wise. ?  ?Monday report on Luis Wise?s blood sugars: ?   He kept spiking especially after lunch and supper last week so after the first few days, we tried giving 8 units pre breakfast, 10 units pre lunch and 10units pre dinner .  Much better results pp after breakfast and dinner, but still spikes to 210-250 after lunch-3pm.  Should he try increasing to 12 units pre lunch ? ?Thank you again.  We sure appreciate your great help!  Hope you have a great week. ?Luis Wise ? ?This message was originally sent through my chart. Patient is aware that you are out of office today. ?

## 2021-05-13 ENCOUNTER — Encounter: Payer: Self-pay | Admitting: Internal Medicine

## 2021-05-23 ENCOUNTER — Other Ambulatory Visit: Payer: Self-pay | Admitting: Internal Medicine

## 2021-05-23 ENCOUNTER — Other Ambulatory Visit: Payer: Self-pay | Admitting: Family Medicine

## 2021-06-03 ENCOUNTER — Other Ambulatory Visit: Payer: Self-pay | Admitting: Internal Medicine

## 2021-06-03 MED ORDER — INSULIN LISPRO (1 UNIT DIAL) 100 UNIT/ML (KWIKPEN): 14.0000 [IU] | PEN_INJECTOR | Freq: Three times a day (TID) | SUBCUTANEOUS | 3 refills | Status: DC

## 2021-06-06 ENCOUNTER — Other Ambulatory Visit: Payer: Self-pay | Admitting: Internal Medicine

## 2021-06-06 ENCOUNTER — Encounter: Payer: Self-pay | Admitting: Internal Medicine

## 2021-06-06 MED ORDER — FIASP FLEXTOUCH 100 UNIT/ML ~~LOC~~ SOPN: PEN_INJECTOR | SUBCUTANEOUS | 3 refills | Status: DC

## 2021-06-08 ENCOUNTER — Other Ambulatory Visit: Payer: Self-pay | Admitting: Internal Medicine

## 2021-06-10 ENCOUNTER — Encounter: Payer: Medicare Other | Attending: Internal Medicine | Admitting: Dietician

## 2021-06-10 ENCOUNTER — Other Ambulatory Visit: Payer: Self-pay | Admitting: Internal Medicine

## 2021-06-10 ENCOUNTER — Ambulatory Visit: Payer: Medicare Other | Admitting: Dietician

## 2021-06-10 DIAGNOSIS — E1159 Type 2 diabetes mellitus with other circulatory complications: Secondary | ICD-10-CM | POA: Insufficient documentation

## 2021-06-10 DIAGNOSIS — Z6825 Body mass index (BMI) 25.0-25.9, adult: Secondary | ICD-10-CM | POA: Diagnosis not present

## 2021-06-10 DIAGNOSIS — Z794 Long term (current) use of insulin: Secondary | ICD-10-CM | POA: Insufficient documentation

## 2021-06-10 DIAGNOSIS — Z7984 Long term (current) use of oral hypoglycemic drugs: Secondary | ICD-10-CM | POA: Diagnosis not present

## 2021-06-10 DIAGNOSIS — E1165 Type 2 diabetes mellitus with hyperglycemia: Secondary | ICD-10-CM | POA: Insufficient documentation

## 2021-06-10 DIAGNOSIS — Z713 Dietary counseling and surveillance: Secondary | ICD-10-CM | POA: Insufficient documentation

## 2021-06-10 DIAGNOSIS — E118 Type 2 diabetes mellitus with unspecified complications: Secondary | ICD-10-CM

## 2021-06-10 MED ORDER — DEXCOM G6 TRANSMITTER MISC
1.0000 | 3 refills | Status: DC
Start: 2021-06-10 — End: 2021-06-18

## 2021-06-10 MED ORDER — DEXCOM G6 RECEIVER DEVI: 1.0000 | Freq: Once | 0 refills | Status: DC

## 2021-06-10 MED ORDER — DEXCOM G6 SENSOR MISC: 1.0000 | 3 refills | Status: DC

## 2021-06-10 MED ORDER — NOVOLOG FLEXPEN 100 UNIT/ML ~~LOC~~ SOPN: 14.0000 [IU] | PEN_INJECTOR | Freq: Three times a day (TID) | SUBCUTANEOUS | 11 refills | Status: DC

## 2021-06-10 NOTE — Patient Instructions (Signed)
Inquire with your insurance regarding how the following are covered: ? Dexcom G6 CGM ? Omnipod 5 Insulin pump ? T:slim insulin pump ? ?Be sure you are taking the Glimepiride 4 mg before breakfast as directed by your MD ? ?Consistent exercise most days of the week.  We can tell on your CGM days that you are active. ? ?  ?

## 2021-06-10 NOTE — Progress Notes (Signed)
?Diabetes Self-Management Education ? ?Visit Type: First/Initial ? ?Appt. Start Time: 0935 Appt. End Time: 1035 ? ?06/16/2021 ? ?[name], identified by name and date of birth, is a 81 y.o. male with a diagnosis of Diabetes: Type 2.  ? ?ASSESSMENT ?Patient is here today with his wife. He was last seen 03/11/2020. ? ?They would like to learn more about how to better control his blood sugar. ?He does not think that his Candie Mile is working well.  Time in range has decreased 16% and noted that glucose may be especially high when he misses his walks.  Contacted MD and Candie Mile was changed to Novolog. ?Patient is interested in an insulin pump.  Briefly discussed options and patient likes the Omnipod 5 best but is to check with his insurance about coverage. ?Discussed that a Dexcom G6 would be required with an insulin pump and it would be beneficial to change from the [place] to the Dexcom earlier to get adjusted to this. ?MD has ordered the [facility] and told patient that he may need to get this from a supplier. ? ?History includes Type 2 diabetes (2004/2005),  CABGx5 01/29/2020, CKD. ?Medications include:  Farxiga, glimepiride 4 mg before breakfast (recently stopped the evening dose 04/24/2021, Tresiba 30-32 units before bed,  Fiasp 12 units before breakfast and 22 units before lunch and dinner, Metformin  (since changed to Novolog) ?CGM:  Free Style Libre ?A1C:  7.5% 04/03/2021, 6.9% 03/11/2020, 7% 01/26/2020, C-peptide 4.3 (08/29/2020) ?  ?Weight hx: ?172 lbs 06/10/2021 ?166 lbs today ?162 lbs post op 01/2020 ?175 lbs UBW ?  ?Patient lives with his wife who does the shopping and cooking.  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 ?He walks 30-40 minutes 3-4 days per week. ? ?Weight 173 lb (78.5 kg). ?Body mass index is 25.55 kg/m?. ? ? Diabetes Self-Management Education - 06/16/21 1759   ? ?  ? Visit Information  ? Visit Type First/Initial   ?  ? Initial Visit  ? Diabetes Type Type 2   ? Are you  currently following a meal plan? Yes   ? What type of meal plan do you follow? lower carb, low sugar   ? Are you taking your medications as prescribed? Yes   ? Date Diagnosed 2005   ?  ? Health Coping  ? How would you rate your overall health? Good   ?  ? Psychosocial Assessment  ? Patient Belief/Attitude about Diabetes Motivated to manage diabetes   ? Self-care barriers None   ? Self-management support Doctor's office;Family   ? Other persons present Patient;Family Member   ? Patient Concerns Nutrition/Meal planning   ? Special Needs None   ? Preferred Learning Style No preference indicated   ? Learning Readiness Ready   ? How often do you need to have someone help you when you read instructions, pamphlets, or other written materials from your doctor or pharmacy? 1 - Never   ? What is the last grade level you completed in school? Master's degree   ?  ? Pre-Education Assessment  ? Patient understands the diabetes disease and treatment process. Demonstrates understanding / competency   ? Patient understands incorporating nutritional management into lifestyle. Needs Review   ? Patient undertands incorporating physical activity into lifestyle. Demonstrates understanding / competency   ? Patient understands using medications safely. Needs Review   ? Patient understands monitoring blood glucose, interpreting and using results Needs Review   ? Patient understands prevention, detection,  and treatment of acute complications. Demonstrates understanding / competency   ? Patient understands prevention, detection, and treatment of chronic complications. Demonstrates understanding / competency   ? Patient understands how to develop strategies to address psychosocial issues. Demonstrates understanding / competency   ? Patient understands how to develop strategies to promote health/change behavior. Needs Review   ?  ? Complications  ? Last HgB A1C per patient/outside source 7.5 %   04/03/2021  ? How often do you check your blood  sugar? > 4 times/day   ? Fasting Blood glucose range (mg/dL) 086-578;[id number];180-200   ? Postprandial Blood glucose range (mg/dL) >528;413-244>200;180-200   ? Number of hypoglycemic episodes per month 0   ? Have you had a dilated eye exam in the past 12 months? Yes   ? Have you had a dental exam in the past 12 months? Yes   ? Are you checking your feet? Yes   ? How many days per week are you checking your feet? 3   ?  ? Dietary Intake  ? Breakfast 2 eggs, bacon, 1 slice of sourdough bread with lite margarine   ? Lunch grilled fish, broccoli, (occasional 2 biscuits)   ? Snack (afternoon) peanuts, fruit   ? Dinner grilled chicken sandwich on [employer], lettuce and tomatoe, potatoes   ? Beverage(s) water, unsweetened decaf tea with splenda and lemon, 1 cup decaf coffee with creamer   ?  ? Exercise  ? Exercise Type Light (walking / raking leaves)   ? How many days per week to you exercise? 3   ? How many minutes per day do you exercise? 60   ? Total minutes per week of exercise 180   ?  ? Patient Education  ? Previous Diabetes Education Yes (please comment)   03/2020  ? Nutrition management  Meal options for control of blood glucose level and chronic complications.   ? Physical activity and exercise  Role of exercise on diabetes management, blood pressure control and cardiac health.   ? Medications Reviewed patients medication for diabetes, action, purpose, timing of dose and side effects.   ? Monitoring Taught/evaluated SMBG meter.;Identified appropriate SMBG and/or A1C goals.   ? Psychosocial adjustment Identified and addressed patients feelings and concerns about diabetes   ?  ? Individualized Goals (developed by patient)  ? Nutrition General guidelines for healthy choices and portions discussed   ? Physical Activity Exercise 3-5 times per week;60 minutes per day   ? Medications take my medication as prescribed   ? Monitoring  test my blood glucose as discussed   ?  ? Post-Education Assessment  ? Patient understands the diabetes disease and  treatment process. Demonstrates understanding / competency   ? Patient understands incorporating nutritional management into lifestyle. Demonstrates understanding / competency   ? Patient undertands incorporating physical activity into lifestyle. Demonstrates understanding / competency   ? Patient understands using medications safely. Demonstrates understanding / competency   ? Patient understands monitoring blood glucose, interpreting and using results Demonstrates understanding / competency   ? Patient understands prevention, detection, and treatment of acute complications. Demonstrates understanding / competency   ? Patient understands prevention, detection, and treatment of chronic complications. Demonstrates understanding / competency   ? Patient understands how to develop strategies to address psychosocial issues. Demonstrates understanding / competency   ? Patient understands how to develop strategies to promote health/change behavior. Demonstrates understanding / competency   ?  ? Outcomes  ? Expected Outcomes Demonstrated interest in learning. Expect  positive outcomes   ? Future DMSE PRN   ? Program Status Completed   ? ?  ?  ? ?  ? ? ?Individualized Plan for Diabetes Self-Management Training:  ? ?Learning Objective:  Patient will have a greater understanding of diabetes self-management. ?Patient education plan is to attend individual and/or group sessions per assessed needs and concerns. ?  ?Plan:  ? ?Patient Instructions  ?Inquire with your insurance regarding how the following are covered: ? Dexcom G6 CGM ? Omnipod 5 Insulin pump ? T:slim insulin pump ? ?Be sure you are taking the Glimepiride 4 mg before breakfast as directed by your MD ? ?Consistent exercise most days of the week.  We can tell on your CGM days that you are active. ? ?  ? ?Expected Outcomes:  Demonstrated interest in learning. Expect positive outcomes ? ?Education material provided:  ? ?If problems or questions, patient to contact team  via:  Phone ? ?Future DSME appointment: PRN ? ?

## 2021-06-11 ENCOUNTER — Other Ambulatory Visit (HOSPITAL_COMMUNITY): Payer: Self-pay

## 2021-06-17 ENCOUNTER — Encounter: Payer: Self-pay | Admitting: Internal Medicine

## 2021-06-17 DIAGNOSIS — E119 Type 2 diabetes mellitus without complications: Secondary | ICD-10-CM

## 2021-06-17 DIAGNOSIS — E1165 Type 2 diabetes mellitus with hyperglycemia: Secondary | ICD-10-CM

## 2021-06-18 ENCOUNTER — Other Ambulatory Visit: Payer: Self-pay | Admitting: Internal Medicine

## 2021-06-18 DIAGNOSIS — E1165 Type 2 diabetes mellitus with hyperglycemia: Secondary | ICD-10-CM

## 2021-06-18 MED ORDER — DEXCOM G6 RECEIVER DEVI: 0 refills | Status: DC

## 2021-06-18 MED ORDER — DEXCOM G6 TRANSMITTER MISC: 1.0000 | 3 refills | Status: DC

## 2021-06-18 MED ORDER — DEXCOM G6 SENSOR MISC: 1.0000 | 3 refills | Status: DC

## 2021-06-23 ENCOUNTER — Telehealth: Payer: Self-pay | Admitting: Dietician

## 2021-06-23 NOTE — Telephone Encounter (Signed)
Patient's wife called and did not leave the reason for the call.  Returned call and left a message.  They are to return the call if they still have questions. ? ?Antonieta Iba, RD, LDN, CDCES ? ?

## 2021-06-25 ENCOUNTER — Telehealth: Payer: Self-pay

## 2021-06-25 NOTE — Telephone Encounter (Signed)
Inbound fax requesting recent clinical notes. Forms faxed to [facility] at [phone] ? ?

## 2021-06-26 NOTE — Telephone Encounter (Signed)
Inbound fax requesting forms be completed and faxed with recent lab notes. Forms completed and faxed to [facility] at [phone]. ? ?

## 2021-07-02 ENCOUNTER — Encounter: Payer: Self-pay | Admitting: Internal Medicine

## 2021-07-05 ENCOUNTER — Other Ambulatory Visit: Payer: Self-pay | Admitting: Cardiovascular Disease

## 2021-07-07 NOTE — Telephone Encounter (Signed)
Please advise on directions. Pharmacy requesting three times daily PRN and chart note states PRN without specific instructions. Thank you! ?

## 2021-07-07 NOTE — Telephone Encounter (Signed)
Please contact pt for future appointment. ?Pt needing refill. ?

## 2021-07-14 ENCOUNTER — Other Ambulatory Visit (HOSPITAL_COMMUNITY): Payer: Self-pay

## 2021-07-14 ENCOUNTER — Telehealth: Payer: Self-pay | Admitting: Pharmacy Technician

## 2021-07-14 DIAGNOSIS — E119 Type 2 diabetes mellitus without complications: Secondary | ICD-10-CM

## 2021-07-14 NOTE — Telephone Encounter (Signed)
Patient Advocate Encounter ?  ?Received notification from CoverMyMeds that prior authorization for Omnipod 5 G6 intro kit is required by his/her insurance [employer] Part D. ? ?Per Test Claim: Unable to test claim. Not in contract with pt's ins at this time.  ?  ?PA submitted on 07/14/21 ?Key [id number] ?Status is pending ?   ?[facility] will continue to follow: ? ?Patient Advocate ?Fax:  [phone] ? ?

## 2021-07-15 ENCOUNTER — Other Ambulatory Visit (HOSPITAL_COMMUNITY): Payer: Self-pay

## 2021-07-15 NOTE — Telephone Encounter (Signed)
T, ?I am not sure what to do with this... ?C ?

## 2021-07-15 NOTE — Telephone Encounter (Signed)
T, see below. Ty! C 

## 2021-07-15 NOTE — Telephone Encounter (Signed)
Patient Advocate Encounter ? ?Prior Authorization for Omnipod 5 G6 Kit has been approved.   ? ?PA#  PA Case ID: [id number] ?Effective dates: 03/09/21 through 03/08/22 ? ?Per Test Claim Patients co-pay is $unable to test claim.  ? ?Patient Advocate ?Fax:  [phone] ? ?

## 2021-07-16 MED ORDER — OMNIPOD 5 DEXG7G6 INTRO GEN 5 KIT: PACK | 0 refills | Status: DC

## 2021-07-16 MED ORDER — OMNIPOD 5 DEXG7G6 PODS GEN 5 MISC: 3 refills | Status: DC

## 2021-07-16 NOTE — Telephone Encounter (Signed)
Rx sent to preferred pharmacy.

## 2021-07-22 ENCOUNTER — Telehealth: Payer: Self-pay | Admitting: Nutrition

## 2021-07-22 DIAGNOSIS — E118 Type 2 diabetes mellitus with unspecified complications: Secondary | ICD-10-CM

## 2021-07-22 NOTE — Telephone Encounter (Signed)
Appointment has been set for the pump start

## 2021-07-22 NOTE — Telephone Encounter (Signed)
Pump start scheduled for next week ?

## 2021-07-25 MED ORDER — INSULIN ASPART 100 UNIT/ML IJ SOLN: INTRAMUSCULAR | 2 refills | Status: DC

## 2021-07-25 NOTE — Telephone Encounter (Signed)
Rx sent to preferred pharmacy.

## 2021-07-25 NOTE — Addendum Note (Signed)
Addended by: Lauralyn Primes on: 07/25/2021 12:04 PM   Modules accepted: Orders

## 2021-07-29 ENCOUNTER — Encounter: Payer: Medicare Other | Attending: Internal Medicine | Admitting: Nutrition

## 2021-07-29 DIAGNOSIS — E118 Type 2 diabetes mellitus with unspecified complications: Secondary | ICD-10-CM | POA: Insufficient documentation

## 2021-07-30 ENCOUNTER — Encounter: Payer: Medicare Other | Admitting: Nutrition

## 2021-07-30 DIAGNOSIS — E118 Type 2 diabetes mellitus with unspecified complications: Secondary | ICD-10-CM | POA: Diagnosis not present

## 2021-07-30 NOTE — Patient Instructions (Signed)
Read over all handouts given and call if questions.  Call Omnipod help line if questions on how to use the pump.

## 2021-07-30 NOTE — Progress Notes (Signed)
Patient is here with his wife to resume pump training and learn carb counting.  CGM download shows low blood sugar at 3AM, and no rise if blood sugar from breakfast and lunch today.  HE treated his low with eating an apple, and blood sugar fasting was 101.  He is currently not counting carbs, but according to what he ate, he is bolusing 1u / 4 grams of carb.  His blood sugar at 3PM today was 91, with 4.06 u of IOB. Basal rate changed from 1.15 to 1.0 u/hr and I/C was set to 1u/10 grams.   We reviewed carb counting, and both he and his wife reported good understanding of what carbs are, and how to calculate the total in his meals.  They have also downloaded Calorie Brooke Dare to use.  Printouts given on how to count carbs as well as sheet with 15 gram portions of most carbs eaten. They had no final questions. Wife requested that she get some glucagon for her husband.  She was shown how to use the [place] sample.

## 2021-07-30 NOTE — Patient Instructions (Signed)
Read over manual Call OmniPod help line if questions  Call office if blood sugars drop low or remain over 250.  Return tomorrow for finished training.

## 2021-07-30 NOTE — Progress Notes (Signed)
Patient is here with his wife to start/learn how to use the OmniPod 5 insulin pump.  We discussed how this pump delivers insulin, and settings were put in by the patient:  Basal rate: 1.15u/hr, I/C: 1 (patient does not know how to count carbs.  He would like to come back tomorrow to learn how to do this.)  ISF: 35, target: 120 with correction over 150.  This is the patient's choice.  We reviewed how to bolus, and re demonstrated this X 2 correctly, and reported good understanding of the need to take insulin for all meals and snacks eaten.   He is currently wearing a Dexcom, and we linked his Dexcom to his PDM. He  then was trained on how to fill a pod with insulin, and filled the pod with 175u of Novolog insulin.  We discussed proper placement of the pod with respect to his dexcom placement, and he reported good understanding of this  He reports that his PDM is linked to [place], and will verify this tomorrow. His pod was started at 4:30PM,after saying he did not take his tresiba last night.  Blood sugar was 93.    He had no final questions.   Both he and his wife reported information overload and wished to finish training tomorrow. He reported having downloaded the manual to his phone and will read it over.  He has read the starter guide and we reviewed where he can review how to fill a pod, give a bolus and IOB.  They had no final questions.

## 2021-07-31 ENCOUNTER — Other Ambulatory Visit: Payer: Self-pay | Admitting: Internal Medicine

## 2021-07-31 MED ORDER — GLUCAGON 3 MG/DOSE NA POWD: 3.0000 mg | Freq: Once | NASAL | 11 refills | Status: DC | PRN

## 2021-08-01 ENCOUNTER — Other Ambulatory Visit: Payer: Self-pay | Admitting: Internal Medicine

## 2021-08-01 MED ORDER — GVOKE HYPOPEN 1-PACK 1 MG/0.2ML ~~LOC~~ SOAJ: SUBCUTANEOUS | 99 refills | Status: DC

## 2021-08-04 ENCOUNTER — Other Ambulatory Visit: Payer: Self-pay | Admitting: Endocrinology

## 2021-08-04 ENCOUNTER — Telehealth: Payer: Self-pay | Admitting: Endocrinology

## 2021-08-04 NOTE — Telephone Encounter (Signed)
Patient's wife called about blood sugars being over 200s since Saturday evening.  Currently in automated mode with the OmniPod 5 and background basal rate was set at 1.0 and carb ratio 1:4 He will temporarily go back to [place] and NovoLog using 30 of Tresiba tonight and 3 units per 15 g carbs along 1: 25 correction factor.  Likely will need to start the OmniPod with manual mode.  To call the office tomorrow

## 2021-08-05 NOTE — Telephone Encounter (Signed)
Luis Wise, Could you please on how his sugars are doing?  Apparently he came off the pump yesterday. I will be seeing him on Thu. Ty! C

## 2021-08-06 NOTE — Telephone Encounter (Signed)
Patient's wife reports that his blood sugars were going over 200 after meals as per Dr Remus Blake note, on day 3 and 4 of his pump start. They decided they could better control his blood sugars on injections until they were seen by Dr. Elvera Lennox.  I explained again the this pump needs 2 pod changes before it can get blood sugars down on it's own, and the need to do correction boluses until this happens.  They prefer to wait until Dr. Elvera Lennox can review the dexcom readings as well as the pump settings.

## 2021-08-07 ENCOUNTER — Ambulatory Visit (INDEPENDENT_AMBULATORY_CARE_PROVIDER_SITE_OTHER): Payer: Medicare Other | Admitting: Internal Medicine

## 2021-08-07 ENCOUNTER — Encounter: Payer: Self-pay | Admitting: Internal Medicine

## 2021-08-07 VITALS — BP 140/68 | HR 61 | Ht 69.0 in | Wt 173.2 lb

## 2021-08-07 DIAGNOSIS — E1159 Type 2 diabetes mellitus with other circulatory complications: Secondary | ICD-10-CM

## 2021-08-07 DIAGNOSIS — E1165 Type 2 diabetes mellitus with hyperglycemia: Secondary | ICD-10-CM

## 2021-08-07 DIAGNOSIS — E119 Type 2 diabetes mellitus without complications: Secondary | ICD-10-CM

## 2021-08-07 DIAGNOSIS — E785 Hyperlipidemia, unspecified: Secondary | ICD-10-CM | POA: Diagnosis not present

## 2021-08-07 LAB — POCT GLYCOSYLATED HEMOGLOBIN (HGB A1C): Hemoglobin A1C: 6.5 % — AB (ref 4.0–5.6)

## 2021-08-07 MED ORDER — DAPAGLIFLOZIN PROPANEDIOL 10 MG PO TABS: 10.0000 mg | ORAL_TABLET | Freq: Every day | ORAL | 3 refills | Status: DC

## 2021-08-07 NOTE — Progress Notes (Signed)
Patient ID: Luis Wise, male   DOB: 05-14-1940, 81 y.o.   MRN: [id number]   HPI: Luis Wise is a 81 y.o.-year-old male, initially referred by his PCP, Dr. Damita Dunnings, returning for follow-up: DM2, dx in 2004-2005, insulin-dependent, uncontrolled, with complications (CAD - s/p CABG x5, CKD).  He is here with his wife, who is a former Press photographer and offers part of the history especially related to medications, blood sugars, and diet.  Last visit 4 months ago.  Interim history: No increased urination, blurry vision, nausea, chest pain. Since last visit, patient started a CGM, then mealtime insulin, and then the OmniPod 5 insulin pump.  However, he only started the pump a week ago and he stopped it 2 days ago due to high blood sugars.  Reviewed HbA1c levels: Lab Results  Component Value Date   HGBA1C 7.5 (A) 04/03/2021   HGBA1C 7.7 (H) 01/08/2021   HGBA1C 7.2 (A) 11/27/2020   HGBA1C 7.5 (A) 08/29/2020   HGBA1C 8.4 (A) 07/01/2020   HGBA1C 6.9 (A) 03/11/2020   HGBA1C 7.0 (H) 01/26/2020   HGBA1C 7.0 (H) 01/02/2020   HGBA1C 6.7 (A) 07/18/2019   HGBA1C 6.5 (A) 12/30/2018   Pt is on a regimen of: - Metformin 500 mg in am and 1000 mg in pm >> 1500 mg at dinnertime - Farxiga 5 >> 10 mg before breakfast-added 03/2020 - Glimepiride 4 mg in am - NovoLog (16)-(16)-(14-16) before B-L-D - Tresiba 10 >> 26 >> 30 >> 32 >> 30 units daily-added 06/2020 >> now 24 units She was also tried on Rybelsus but this was expensive.  He then tried Ozempic but he developed nausea and vomiting and then increase lipase on 02/27/2020 (82  >> 119) on the 0.5 mg dose.  Ozempic was stopped at that time. Prev. On Januvia >> tolerated well but expensive. We stopped glimepiride when starting mealtime insulin  Prev. On the Omnipod 5: - Basal rates: 12 am: 1 units/h - Insulin to carb ratio: 12 am: 1:10 - Target: 12 am: 120-150 - Correction factor (insulin sensitivity factor):  12 am: 35 - Active insulin time: 4h -  Changes infusion site: q3 days  Total daily dose from basal insulin: 24 units Total daily dose from bolus insulin: 25-36 units  Pt checks his sugars >4x a day:     Previously: - am:  129, 153-215, 235 >> 119-170, 183 >> 73-118, 142, 166 >> 118-157, 187, 200 - 2h after b'fast: n/c >> 183-251 >> n/c - before lunch: n/c >> 157, 271 >> 182-219 >> 256 >> n/c   - 2h after lunch: n/c >> 169 >> 95, 98, 205  - before dinner: 173-239, 347 (milkshake) >> 124-209, 278 >> 122-163 (snack) >> 106 - 2h after dinner: n/c >> 166, 181 >> n/c >> 146, 184 >> n/c - bedtime: n/c >> 161 >> 189, 211 >> n/c - nighttime: n/c >> 135 >> n/c Lowest sugar was 50s before sx >> 129 >> 119 >> 73 >> 70; he has hypoglycemia awareness in the 60s.  Highest sugar was 347 (milkshake) >> 278 >> 200s >> 250  Glucometer: OneTouch ultra  Pt's meals are: - Breakfast: 1 egg + toast or cereal or oatmeal - Lunch: grilled fish + veggies - Dinner: meat + veggies - Snacks: 2: nuts, wheat crackers He walks 3 miles 3 days a week for exercise.  He finished cardiac rehab.   -No CKD, last BUN/creatinine:  Lab Results  Component Value Date   BUN  27 (H) 01/08/2021   BUN 29 (H) 05/10/2020   CREATININE 1.30 01/08/2021   CREATININE 1.29 05/10/2020  On lisinopril 20 twice a day.  -+ HL; last set of lipids: Lab Results  Component Value Date   CHOL 112 01/08/2021   HDL 20.20 (L) 01/08/2021   LDLCALC 66 04/11/2020   LDLDIRECT 35.0 01/08/2021   TRIG 356.0 (H) 01/08/2021   CHOLHDL 6 01/08/2021  On atorvastatin 80 mg daily, fenofibrate 160, co-Q10  On ASA 81.  - last eye exam was in 11/2020: No DR reportedly  - no numbness and tingling in his feet.  Last foot exam January 13, 2021.  Pt has FH of DM in father in his 29s.  He was just taken off amiodarone.    ROS: + See HPI  I reviewed pt's medications, allergies, PMH, social hx, family hx, and changes were documented in the history of present illness. Otherwise,  unchanged from my initial visit note.  Past Medical History:  Diagnosis Date   Allergy    Amoxicillin   Cataract 2017   Mild progression   Coronary artery disease    a. s/p 5-vessel CABG (LIMA-LAD, SVG-diag, sequential SVG-OM1-OM 2, & SVG-PDA)   Diabetes mellitus with complication (HCC)    Hyperlipidemia    Hypertension    Insomnia    neuropathy    bilateral feet   Postoperative atrial fibrillation [facility])    Past Surgical History:  Procedure Laterality Date   BACK SURGERY     CARDIAC CATHETERIZATION     01/19/2020   COLONOSCOPY WITH PROPOFOL N/A 04/01/2021   Procedure: COLONOSCOPY WITH PROPOFOL;  Surgeon: Lucilla Lame, MD;  Location: [facility] ENDOSCOPY;  Service: Endoscopy;  Laterality: N/A;   CORONARY ARTERY BYPASS GRAFT N/A 01/29/2020   Procedure: CORONARY ARTERY BYPASS GRAFTING (CABG) TIMES FIVE USING LIMA to LAD; ENDOSCOPIC HARVESTED GREATER SAPHENOUS VEIN: SVG to PDA; SVG to sequenced OM1 & OM2.;  Surgeon: Ivin Poot, MD;  Location: [place];  Service: Open Heart Surgery;  Laterality: N/A;   ENDOVEIN HARVEST OF GREATER SAPHENOUS VEIN Bilateral 01/29/2020   Procedure: ENDOVEIN HARVEST OF GREATER SAPHENOUS VEIN;  Surgeon: Ivin Poot, MD;  Location: [place];  Service: Open Heart Surgery;  Laterality: Bilateral;   EYE SURGERY  Radial K   1997   LAMINECTOMY  1993   L5-S1   LEFT HEART CATH AND CORONARY ANGIOGRAPHY N/A 01/19/2020   Procedure: LEFT HEART CATH AND CORONARY ANGIOGRAPHY;  Surgeon: Wellington Hampshire, MD;  Location: [place] [facility];  Service: Cardiovascular;  Laterality: N/A;   MENISCUS REPAIR  1996   TEE WITHOUT CARDIOVERSION N/A 01/29/2020   Procedure: TRANSESOPHAGEAL ECHOCARDIOGRAM (TEE);  Surgeon: Prescott Gum, Collier Salina, MD;  Location: [place];  Service: Open Heart Surgery;  Laterality: N/A;   TONSILLECTOMY     Social History   Socioeconomic History   Marital status: Married    Spouse name: Not on file   Number of children: Not on file   Years of education: Not  on file   Highest education level: Not on file  Occupational History   Occupation: CFO  Tobacco Use   Smoking status: Never   Smokeless tobacco: Never  Vaping Use   Vaping Use: Never used  Substance and Sexual Activity   Alcohol use: Not Currently    Alcohol/week: 0.0 standard drinks    Comment: Very rare- occasionaly beer or wine    Drug use: No   Sexual activity: Yes    Birth control/protection:  None  Other Topics Concern   Not on file  Social History Narrative   Married 1970.   [employer], Oceanographer at [employer].   Curator, land and sea.     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   Social Determinants of Health   Financial Resource Strain: Low Risk    Difficulty of Paying Living Expenses: Not hard at all  Food Insecurity: No Food Insecurity   Worried About Charity fundraiser in the Last Year: Never true   [place] in the Last Year: Never true  Transportation Needs: No Transportation Needs   Lack of Transportation (Medical): No   Lack of Transportation (Non-Medical): No  Physical Activity: Insufficiently Active   Days of Exercise per Week: 2 days   Minutes of Exercise per Session: 60 min  Stress: No Stress Concern Present   Feeling of Stress : Not at all  Social Connections: Socially Integrated   Frequency of Communication with Friends and Family: More than three times a week   Frequency of Social Gatherings with Friends and Family: Never   Attends Religious Services: More than 4 times per year   Active Member of [employer] or Organizations: Yes   Attends Music therapist: More than 4 times per year   Marital Status: Married  Human resources officer Violence: Not At Risk   Fear of Current or Ex-Partner: No   Emotionally Abused: No   Physically Abused: No   Sexually Abused: No   Current Outpatient Medications on File Prior to Visit  Medication Sig Dispense Refill    amLODipine (NORVASC) 10 MG tablet Take 1 tablet (10 mg total) by mouth daily. 90 tablet 3   atorvastatin (LIPITOR) 80 MG tablet TAKE 1 TABLET BY MOUTH EVERY DAY 90 tablet 2   carvedilol (COREG) 12.5 MG tablet TAKE 1 TABLET BY MOUTH 2 TIMES DAILY. 180 tablet 2   cholecalciferol (VITAMIN D3) 25 MCG (1000 UNIT) tablet Take 1,000 Units by mouth daily.     Coenzyme Q10 (COQ10) 100 MG CAPS Take 100 mg by mouth daily.      Continuous Blood Gluc Receiver (DEXCOM G6 RECEIVER) DEVI Use as instructed to check blood sugar. E11.65 1 each 0   Continuous Blood Gluc Transmit (DEXCOM G6 TRANSMITTER) MISC 1 Device by Does not apply route every 3 (three) months. E11.65 1 each 3   dapagliflozin propanediol (FARXIGA) 10 MG TABS tablet Take 1 tablet (10 mg total) by mouth daily before breakfast. 90 tablet 3   fenofibrate 160 MG tablet TAKE 1 TABLET BY MOUTH EVERY DAY 90 tablet 1   glimepiride (AMARYL) 4 MG tablet TAKE 1 TABLET BY MOUTH EVERY DAY WITH BREAKFAST 90 tablet 0   glucose blood (ONETOUCH ULTRA) test strip TEST 2X A DAY 200 strip 3   GVOKE HYPOPEN 1-PACK 1 MG/0.2ML SOAJ Inject 0.2 mg under skin as needed for low blood sugars 0.2 mL prn   insulin aspart (NOVOLOG) 100 UNIT/ML injection Inject up to 70 units daily via INSULIN PUMP 60 mL 2   Insulin Disposable Pump (OMNIPOD 5 G6 INTRO, GEN 5,) KIT Use as instructed to administer insulin 1 kit 0   Insulin Disposable Pump (OMNIPOD 5 G6 POD, GEN 5,) MISC Use as instructed to administer insulin 30 each 3   Insulin Pen Needle 32G X 4 MM MISC Use 4x a day 300 each 3   lisinopril (ZESTRIL) 20 MG tablet TAKE 1 TABLET BY  MOUTH EVERY DAY 90 tablet 0   lisinopril-hydrochlorothiazide (ZESTORETIC) 20-25 MG tablet Take 1 tablet by mouth daily. 90 tablet 3   metFORMIN (GLUCOPHAGE) 1000 MG tablet TAKE 1 TABLET BY MOUTH TWICE A DAY 180 tablet 3   Multiple Vitamins-Minerals (PRESERVISION AREDS 2 PO) Take 1 tablet by mouth 2 (two) times daily.      Omega-3 Fatty Acids (FISH OIL)  1000 MG CAPS Take 1-2 capsules (1,000-2,000 mg total) by mouth in the morning and at bedtime.  0   sildenafil (REVATIO) 20 MG tablet TAKE ONE TABLET BY MOUTH THREE TIMES A DAY AS NEEDED 90 tablet 0   traZODone (DESYREL) 100 MG tablet Take 1 tablet (100 mg total) by mouth at bedtime. 90 tablet 3   TRESIBA FLEXTOUCH 200 UNIT/ML FlexTouch Pen INJECT 30 UNITS INTO THE SKIN DAILY. 12 mL 3   Zinc Oxide 15 MG TBDP 2 tabs per day     No current facility-administered medications on file prior to visit.   Allergies  Allergen Reactions   Ozempic (0.25 Or 0.5 Mg-Dose) [Semaglutide(0.25 Or 0.5mg -Dos)]     Presumed cause of GI upset.     Amoxil [Amoxicillin] Rash   Family History  Problem Relation Age of Onset   Hypertension Mother    Diabetes Father    Cancer Neg Hx    COPD Neg Hx    Heart disease Neg Hx    Hyperlipidemia Neg Hx    Stroke Neg Hx    Colon cancer Neg Hx    Prostate cancer Neg Hx    PE: There were no vitals taken for this visit. Wt Readings from Last 3 Encounters:  06/16/21 173 lb (78.5 kg)  04/03/21 175 lb 3.2 oz (79.5 kg)  04/01/21 162 lb (73.5 kg)   Constitutional: normal weight, in NAD Eyes: PERRLA, EOMI, no exophthalmos ENT: moist mucous membranes, no thyromegaly, no cervical lymphadenopathy Cardiovascular: RRR, No MRG Respiratory: CTA B Musculoskeletal: no deformities Skin: moist, warm, no rashes Neurological: no tremor with outstretched hands  ASSESSMENT: 1. DM2, insulin-dependent, uncontrolled, with complications - CAD, s/p CABG x5 - CKD  Component     Latest Ref Rng & Units 08/29/2020          Glucose     65 - 99 mg/dL 170 (H)  Hemoglobin A1C     4.0 - 5.6 % 7.5 (A)  C-Peptide     0.80 - 3.85 ng/mL 4.30 (H)  Islet Cell Ab     Neg:<1:1 Negative  ZNT8 Antibodies     <15 U/mL <10  Glutamic Acid Decarb Ab     <5 IU/mL <5  No insulin deficiency or pancreatic autoimmunity.  He has good insulin production.  2. HL  PLAN:  1. Patient with  longstanding, uncontrolled, type 2 diabetes, on oral antidiabetic regimen with metformin, SGLT2 inhibitor and basal-bolus insulin regimen.  We started an insulin pump since last visit per his request, but of which she came off 3 days ago due to high blood sugars. -Of note, we did not use a GLP-1 receptor agonist due to previously elevated lipase. -At last visit, sugars appeared to be slightly higher in the morning with only some blood sugars at goal and the rest above target.  We discussed about obtaining a CGM but I did not recommend a change in regimen at that time, since he was planning to change his diet after the holidays.  However, since last visit, he contacted me about wanting to start the pump.  He did start OmniPod 5, but, as mentioned above, he came off due to persistently elevated blood sugars.  He was contacted by the diabetes educator advising him that the pump will only able to bring his blood sugars down after 2 blood changes, but he did not restart the pump until today's visit. CGM interpretation: -At today's visit, we reviewed his CGM downloads: It appears that 83% of values are in target range (goal >70%), while 17% are higher than 180 (goal <25%), and 3% are lower than 70 (goal <4%).  The calculated average blood sugar is 139.  The projected HbA1c for the next 3 months (GMI) is 6.6%. -Reviewing the CGM trends, sugars appear to be worse in the last 2 weeks compared to the previous 2 weeks (please see comparison in the HPI).  For the last 2 weeks, sugars increased after every meal, but especially after dinner.  Upon questioning, he switched to the OmniPod approximately 1 week ago, but he had to stop it 2 days ago due to high blood sugars.  Per reviewing individual CBG daily traces, sugars were mostly increasing after dinner.  In the last 2 days, sugars are excellent after dinner, and stay well controlled.  Upon reviewing his previous pump settings and his current NovoLog insulin doses, it  appears that the hyperglycemia was appearing during his OmniPod use due to too little insulin before meals, and especially before dinner.  At this visit, we discussed about changing his insulin to carb ratios to reflect the doses of insulin that he is taking now.  This is equivalent to an insulin to carb ratio of 1:6, while the setting in the pump was 1:10.  As of now, I advised him to change to 1:7 and decrease it further if still needed afterwards.  I believe that the rest of the settings are adequate, especially if he stays in the automatic mode.  At this visit, I also advised him to continue [place] and metformin, but to stop glimepiride along with [place] and the NovoLog pen. - I suggested to:  Patient Instructions  Please stop: - Tresiba - NovoLog injections - Glimepiride  Continue: - Farxiga 10 mg before b'fast - Metformin 1500 mg with dinner  Restart the Omnipod: - Basal rates: 12 am: 1 units/h - Insulin to carb ratio: 12 am: 1:10 >> 1:7 (if sugars remain high after meals, change to 1:6) - Target: 12 am: 120-150 - Correction factor (insulin sensitivity factor):  12 am: 35 - Active insulin time: 4h - Changes infusion site: q3 days  Please return in 3-4 months.  - we checked his HbA1c: 6.5% (improved) - advised to check sugars at different times of the day - 4x a day, rotating check times - advised for yearly eye exams >> he is UTD - return to clinic in 3-4 months  2. HL -Reviewed latest lipid panel from 01/2021: LDL at goal, triglycerides high, HDL low: Lab Results  Component Value Date   CHOL 112 01/08/2021   HDL 20.20 (L) 01/08/2021   LDLCALC 66 04/11/2020   LDLDIRECT 35.0 01/08/2021   TRIG 356.0 (H) 01/08/2021   CHOLHDL 6 01/08/2021  -He continues on atorvastatin 80 mg daily and fenofibrate 160 mg daily without side effects  Philemon Kingdom, MD PhD [facility] Endocrinology

## 2021-08-07 NOTE — Patient Instructions (Addendum)
Please stop: - Tresiba - NovoLog injections - Glimepiride  Continue: - Farxiga 10 mg before b'fast - Metformin 1500 mg with dinner  Restart the Omnipod: - Basal rates: 12 am: 1 units/h - Insulin to carb ratio: 12 am: 1:10 >> 1:7 (if sugars remain high after meals, change to 1:6) - Target: 12 am: 120-150 - Correction factor (insulin sensitivity factor):  12 am: 35 - Active insulin time: 4h - Changes infusion site: q3 days    Please return in 3-4 months.

## 2021-08-13 ENCOUNTER — Encounter (INDEPENDENT_AMBULATORY_CARE_PROVIDER_SITE_OTHER): Payer: Medicare Other | Admitting: Internal Medicine

## 2021-08-13 DIAGNOSIS — E118 Type 2 diabetes mellitus with unspecified complications: Secondary | ICD-10-CM | POA: Diagnosis not present

## 2021-08-14 NOTE — Telephone Encounter (Signed)
Please see the MyChart message reply(ies) for my assessment and plan.    This patient gave consent for this Medical Advice Message and is aware that it may result in a bill to [employer], as well as the possibility of receiving a bill for a co-payment or deductible. They are an established patient, but are not seeking medical advice exclusively about a problem treated during an in person or video visit in the last seven days. I did not recommend an in person or video visit within seven days of my reply.   CGM trends reviewed and changes in insulin pump suggested.          I spent a total of 8 minutes cumulative time within 7 days through [employer].  Carlus Pavlov, MD

## 2021-08-17 NOTE — Progress Notes (Unsigned)
Cardiology Office Note  Date:  08/18/2021   ID:  Luis Wise, DOB 1940/07/27, MRN [id number]  PCP:  Luis Kingdom, MD   Chief Complaint  Patient presents with   12 month follow up     "Doing well." Medications reviewed by the patient verbally.     HPI:  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 Denies shortness of breath or chest pain on exertion  Labs revierwed: A1C 6.5 uses a monitoring device to accurately check sugars LDL 35 Elevated triglycerides more than 300 in November 2022  Non-smoker  EKG personally reviewed by myself on todays visit Normal sinus rhythm rate 59 bpm left bundle branch block  Past medical history reviewed Had three-vessel coronary disease on catheterization January 19, 2020 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 converted to normal sinus rhythm   Cardiac rehab,  Walking 3 miles a day Denies any anginal symptoms on exertion No side effects from medications  Lab work reviewed A1C: 6.9 Total chol 122, LDL 66  EKG personally reviewed by myself on todays visit Normal sinus rhythm rate 61 bpm left bundle branch block  Other past medical history reviewed Licensed pilot,  PMH:   has a past medical history of Allergy, Cataract (2017), Coronary artery disease, Diabetes mellitus with complication ([place]),  Hyperlipidemia, Hypertension, Insomnia, neuropathy, and Postoperative atrial fibrillation ([place]).  PSH:    Past Surgical History:  Procedure Laterality Date   BACK SURGERY     CARDIAC CATHETERIZATION     01/19/2020   COLONOSCOPY WITH PROPOFOL N/A 04/01/2021   Procedure: COLONOSCOPY WITH PROPOFOL;  Surgeon: Lucilla Lame, MD;  Location: [facility] ENDOSCOPY;  Service: Endoscopy;  Laterality: N/A;   CORONARY ARTERY BYPASS GRAFT N/A 01/29/2020   Procedure: CORONARY ARTERY BYPASS GRAFTING (CABG) TIMES FIVE USING LIMA to LAD; ENDOSCOPIC HARVESTED GREATER SAPHENOUS VEIN: SVG to PDA; SVG to sequenced OM1 & OM2.;  Surgeon: Ivin Poot, MD;  Location: [place];  Service: Open Heart Surgery;  Laterality: N/A;   ENDOVEIN HARVEST OF GREATER SAPHENOUS VEIN Bilateral 01/29/2020   Procedure: ENDOVEIN HARVEST OF GREATER SAPHENOUS VEIN;  Surgeon: Ivin Poot, MD;  Location: [place];  Service: Open Heart Surgery;  Laterality: Bilateral;   EYE SURGERY  Radial K   1997   LAMINECTOMY  1993   L5-S1   LEFT HEART CATH AND CORONARY ANGIOGRAPHY N/A 01/19/2020   Procedure: LEFT HEART CATH AND CORONARY ANGIOGRAPHY;  Surgeon: Wellington Hampshire, MD;  Location: [facility] CV LAB;  Service: Cardiovascular;  Laterality: N/A;   MENISCUS REPAIR  1996   TEE WITHOUT CARDIOVERSION N/A 01/29/2020   Procedure: TRANSESOPHAGEAL ECHOCARDIOGRAM (TEE);  Surgeon: Prescott Gum, Collier Salina, MD;  Location: [place];  Service: Open Heart Surgery;  Laterality: N/A;   TONSILLECTOMY      Current Outpatient Medications  Medication Sig Dispense Refill   amLODipine (NORVASC) 10 MG tablet Take 1 tablet (10 mg total) by  mouth daily. 90 tablet 3   atorvastatin (LIPITOR) 80 MG tablet TAKE 1 TABLET BY MOUTH EVERY DAY 90 tablet 2   carvedilol (COREG) 12.5 MG tablet TAKE 1 TABLET BY MOUTH 2 TIMES DAILY. 180 tablet 2   cholecalciferol (VITAMIN D3) 25 MCG (1000 UNIT) tablet Take 1,000 Units by mouth daily.     Coenzyme Q10 (COQ10) 100 MG CAPS Take 100 mg by  mouth daily.      Continuous Blood Gluc Transmit (DEXCOM G6 TRANSMITTER) MISC 1 Device by Does not apply route every 3 (three) months. E11.65 1 each 3   dapagliflozin propanediol (FARXIGA) 10 MG TABS tablet Take 1 tablet (10 mg total) by mouth daily before breakfast. 90 tablet 3   fenofibrate 160 MG tablet TAKE 1 TABLET BY MOUTH EVERY DAY 90 tablet 1   glucose blood (ONETOUCH ULTRA) test strip TEST 2X A DAY 200 strip 3   GVOKE HYPOPEN 1-PACK 1 MG/0.2ML SOAJ Inject 0.2 mg under skin as needed for low blood sugars 0.2 mL prn   insulin aspart (NOVOLOG) 100 UNIT/ML injection Inject up to 70 units daily via INSULIN PUMP 60 mL 2   lisinopril (ZESTRIL) 20 MG tablet TAKE 1 TABLET BY MOUTH EVERY DAY 90 tablet 0   lisinopril-hydrochlorothiazide (ZESTORETIC) 20-25 MG tablet Take 1 tablet by mouth daily. 90 tablet 3   metFORMIN (GLUCOPHAGE) 1000 MG tablet TAKE 1 TABLET BY MOUTH TWICE A DAY 180 tablet 3   Multiple Vitamins-Minerals (PRESERVISION AREDS 2 PO) Take 1 tablet by mouth 2 (two) times daily.      Omega-3 Fatty Acids (FISH OIL) 1000 MG CAPS Take 1-2 capsules (1,000-2,000 mg total) by mouth in the morning and at bedtime.  0   sildenafil (REVATIO) 20 MG tablet TAKE ONE TABLET BY MOUTH THREE TIMES A DAY AS NEEDED 90 tablet 0   traZODone (DESYREL) 100 MG tablet Take 1 tablet (100 mg total) by mouth at bedtime. 90 tablet 3   Zinc Oxide 15 MG TBDP 2 tabs per day     Continuous Blood Gluc Receiver (DEXCOM G6 RECEIVER) DEVI Use as instructed to check blood sugar. E11.65 (Patient not taking: Reported on 08/18/2021) 1 each 0   Insulin Disposable Pump (OMNIPOD 5 G6 INTRO, GEN 5,) KIT Use as instructed to administer insulin (Patient not taking: Reported on 08/18/2021) 1 kit 0   Insulin Disposable Pump (OMNIPOD 5 G6 POD, GEN 5,) MISC Use as instructed to administer insulin (Patient not taking: Reported on 08/18/2021) 30 each 3   Insulin Pen Needle 32G X 4 MM MISC Use 4x a day (Patient not taking: Reported on 08/18/2021)  300 each 3   TRESIBA FLEXTOUCH 200 UNIT/ML FlexTouch Pen INJECT 30 UNITS INTO THE SKIN DAILY. (Patient not taking: Reported on 08/18/2021) 12 mL 3   No current facility-administered medications for this visit.     Allergies:   Ozempic (0.25 or 0.5 mg-dose) [semaglutide(0.25 or 0.41m-dos)] and Amoxil [amoxicillin]   Social History:  The patient  reports that he has never smoked. He has never used smokeless tobacco. He reports that he does not currently use alcohol. He reports that he does not use drugs.   Family History:   family history includes Diabetes in his father; Hypertension in his mother.    Review of Systems: Review of Systems  Constitutional: Negative.   HENT: Negative.    Respiratory: Negative.    Cardiovascular: Negative.   Gastrointestinal: Negative.   Musculoskeletal: Negative.   Neurological: Negative.   Psychiatric/Behavioral: Negative.  All other systems reviewed and are negative.  PHYSICAL EXAM: VS:  BP 138/60 (BP Location: Left Arm, Patient Position: Sitting, Cuff Size: Normal)   Pulse (!) 59   Ht 5' 9.5" (1.765 m)   Wt 172 lb (78 kg)   SpO2 98%   BMI 25.04 kg/m  , BMI Body mass index is 25.04 kg/m. Constitutional:  oriented to person, place, and time. No distress.  HENT:  Head: Grossly normal Eyes:  no discharge. No scleral icterus.  Neck: No JVD, no carotid bruits  Cardiovascular: Regular rate and rhythm, no murmurs appreciated Pulmonary/Chest: Clear to auscultation bilaterally, no wheezes or rails Abdominal: Soft.  no distension.  no tenderness.  Musculoskeletal: Normal range of motion Neurological:  normal muscle tone. Coordination normal. No atrophy Skin: Skin warm and dry Psychiatric: normal affect, pleasant  Recent Labs: 01/08/2021: ALT 40; BUN 27; Creatinine, Ser 1.30; Hemoglobin 15.0; Platelets 291.0; Potassium 4.0; Sodium 137; TSH 1.45    Lipid Panel Lab Results  Component Value Date   CHOL 112 01/08/2021   HDL 20.20 (L) 01/08/2021    LDLCALC 66 04/11/2020   TRIG 356.0 (H) 01/08/2021    Wt Readings from Last 3 Encounters:  08/18/21 172 lb (78 kg)  08/07/21 173 lb 3.2 oz (78.6 kg)  06/16/21 173 lb (78.5 kg)     ASSESSMENT AND PLAN:  Problem List Items Addressed This Visit       Cardiology Problems   Hypertension associated with diabetes ([place])   Atrial fibrillation ([place])     Other   Anemia   S/P CABG x 5   Relevant Orders   EKG 12-Lead   Other Visit Diagnoses     Coronary artery disease of native artery of native heart with stable angina pectoris ([place])    -  Primary   Relevant Orders   EKG 12-Lead   Type 2 diabetes mellitus without complication, without long-term current use of insulin ([facility])       Relevant Orders   EKG 12-Lead   Hyperlipidemia LDL goal <70       Relevant Orders   EKG 12-Lead     CAD with stable angina CABG x5, November 2021 Continue aspirin, statin, beta-blocker, ACE Walks 3 times a week, no anginal symptoms  Hyperlipidemia Tolerating Lipitor 80 daily Cholesterol at goal  Essential hypertension Blood pressure is well controlled on today's visit. No changes made to the medications.  Diabetes type 2 with complications Followed by endocrinology A1c around 6.5, numbers improving   Total encounter time more than 30 minutes  Greater than 50% was spent in counseling and coordination of care with the patient   Signed, Esmond Plants, M.D., Ph.D. [place], [place]

## 2021-08-18 ENCOUNTER — Ambulatory Visit (INDEPENDENT_AMBULATORY_CARE_PROVIDER_SITE_OTHER): Payer: Medicare Other | Admitting: Cardiovascular Disease

## 2021-08-18 ENCOUNTER — Encounter: Payer: Self-pay | Admitting: Cardiovascular Disease

## 2021-08-18 VITALS — BP 138/60 | HR 59 | Ht 69.5 in | Wt 172.0 lb

## 2021-08-18 DIAGNOSIS — D649 Anemia, unspecified: Secondary | ICD-10-CM | POA: Diagnosis not present

## 2021-08-18 DIAGNOSIS — E785 Hyperlipidemia, unspecified: Secondary | ICD-10-CM | POA: Diagnosis not present

## 2021-08-18 DIAGNOSIS — I152 Hypertension secondary to endocrine disorders: Secondary | ICD-10-CM

## 2021-08-18 DIAGNOSIS — I4819 Other persistent atrial fibrillation: Secondary | ICD-10-CM | POA: Diagnosis not present

## 2021-08-18 DIAGNOSIS — E119 Type 2 diabetes mellitus without complications: Secondary | ICD-10-CM | POA: Diagnosis not present

## 2021-08-18 DIAGNOSIS — E1159 Type 2 diabetes mellitus with other circulatory complications: Secondary | ICD-10-CM | POA: Diagnosis not present

## 2021-08-18 DIAGNOSIS — Z951 Presence of aortocoronary bypass graft: Secondary | ICD-10-CM

## 2021-08-18 DIAGNOSIS — I25118 Atherosclerotic heart disease of native coronary artery with other forms of angina pectoris: Secondary | ICD-10-CM | POA: Diagnosis not present

## 2021-08-18 MED ORDER — LISINOPRIL 20 MG PO TABS: 20.0000 mg | ORAL_TABLET | Freq: Every day | ORAL | 3 refills | Status: DC

## 2021-08-18 NOTE — Patient Instructions (Signed)
Medication Instructions:  No changes  If you need a refill on your cardiac medications before your next appointment, please call your pharmacy.   Lab work: No new labs needed  Testing/Procedures: No new testing needed  Follow-Up: At [facility], you and your health needs are our priority.  As part of our continuing mission to provide you with exceptional heart care, we have created designated Provider Care Teams.  These Care Teams include your primary Cardiologist (physician) and Advanced Practice Providers (APPs -  Physician Assistants and Nurse Practitioners) who all work together to provide you with the care you need, when you need it.  You will need a follow up appointment in 12 months  Providers on your designated Care Team:   Christopher Berge, NP Ryan Dunn, PA-C Cadence Furth, PA-C  COVID-19 Vaccine Information can be found at: [email or web address] For questions related to vaccine distribution or appointments, please email [email or web address] or call [phone].   

## 2021-08-21 ENCOUNTER — Other Ambulatory Visit: Payer: Self-pay | Admitting: Internal Medicine

## 2021-08-21 ENCOUNTER — Other Ambulatory Visit: Payer: Self-pay | Admitting: Cardiovascular Disease

## 2021-08-25 ENCOUNTER — Telehealth: Payer: Self-pay | Admitting: Nutrition

## 2021-08-25 NOTE — Telephone Encounter (Signed)
LVM to see if patient and wife would like to come in for further training on OmniPod pump,or for review

## 2021-08-27 ENCOUNTER — Encounter: Payer: Medicare Other | Attending: Internal Medicine | Admitting: Nutrition

## 2021-08-27 DIAGNOSIS — E118 Type 2 diabetes mellitus with unspecified complications: Secondary | ICD-10-CM | POA: Insufficient documentation

## 2021-08-27 NOTE — Progress Notes (Signed)
Patient is here with his wife to review the checklist and to have questions answered.  He reports that his blood sugars are going high after lunch, in the 200s, and requires a bolus to get it back down, as well as giving more carbs that he is eating.   All other meals have good 2hr. Pc readings.  Stressed importance of not putting in more carbs, than what is eaten, so the pump can work more efficiently.  They agreed not to do this any more.   We changed the I/C ratio from 6 to 5.5u, giving him about 1.5-2.0 units more at lunch time only.  His target is set to 110, but the correct above was set to 140.  This was dropped to 120 to help get the readings down faster.  He was told that if his blood sugars drop low at 3-4 hours after lunch, to increase the carb ratio back to 6.  He agreed to do this.  Other questions were answered about IOB, and ISF, and pod placements.  We reviewed the checklist and he signed off as understanding all topics with no final questions.

## 2021-08-27 NOTE — Patient Instructions (Signed)
Put in correct amount of carbs eaten at each meal 2.Change carb ratio at lunch back to 6.0 if blood sugars drop low before supper. 3.  Reduce lunch bolus by 2u if walking for 1 hour after lunch.

## 2021-09-02 ENCOUNTER — Other Ambulatory Visit: Payer: Self-pay | Admitting: Internal Medicine

## 2021-09-02 DIAGNOSIS — E119 Type 2 diabetes mellitus without complications: Secondary | ICD-10-CM

## 2021-09-02 MED ORDER — HUMALOG KWIKPEN 200 UNIT/ML ~~LOC~~ SOPN
PEN_INJECTOR | SUBCUTANEOUS | 3 refills | Status: DC
Start: 2021-09-02 — End: 2021-09-08

## 2021-09-08 ENCOUNTER — Other Ambulatory Visit (HOSPITAL_COMMUNITY): Payer: Self-pay

## 2021-09-08 ENCOUNTER — Encounter: Payer: Medicare Other | Attending: Internal Medicine | Admitting: Nutrition

## 2021-09-08 ENCOUNTER — Other Ambulatory Visit: Payer: Self-pay

## 2021-09-08 ENCOUNTER — Telehealth: Payer: Self-pay | Admitting: Pharmacy Technician

## 2021-09-08 ENCOUNTER — Other Ambulatory Visit: Payer: Self-pay | Admitting: Internal Medicine

## 2021-09-08 DIAGNOSIS — E119 Type 2 diabetes mellitus without complications: Secondary | ICD-10-CM

## 2021-09-08 DIAGNOSIS — E118 Type 2 diabetes mellitus with unspecified complications: Secondary | ICD-10-CM | POA: Insufficient documentation

## 2021-09-08 MED ORDER — HUMALOG KWIKPEN 200 UNIT/ML ~~LOC~~ SOPN: PEN_INJECTOR | SUBCUTANEOUS | 3 refills | Status: DC

## 2021-09-08 MED ORDER — OMNIPOD 5 DEXG7G6 PODS GEN 5 MISC: 3 refills | Status: DC

## 2021-09-08 NOTE — Telephone Encounter (Signed)
Patient Advocate Encounter   Received notification from [facility] that prior authorization for Humalog 200u is required by his/her insurance [employer].  Per Test Claim: Novo is preferred    PA submitted on 09/08/21 Key [id number] Status is pending    [facility] will continue to follow:  Patient Advocate Fax:  [phone]

## 2021-09-08 NOTE — Telephone Encounter (Signed)
Patient Advocate Encounter  Prior Authorization for Humalog has been approved.    PA# PA Case ID: [id number] Effective dates: 03/09/21 through 03/08/22

## 2021-09-08 NOTE — Patient Instructions (Signed)
When starting the U-200 Humalog insulin, reduce your basal rate by 1/2, and double your carb ration and correction factor.

## 2021-09-08 NOTE — Progress Notes (Signed)
Patient is here with his wife and says that Says no script was sent to pharmacy for this U-200 Humalog insulin.  [facility].  Patient was trained on how to fill the pod with 300u of U-200 Humalog insulin (this is a unit/unit draw from the pen.).  They were also trained on how to reduce the basal rate by 1/2 the amount, and to double to carb ratio, and ISF.  The both took notes on the steps for this.  Luis Wise was saying that his blood sugar is high after lunch every day with a correction of at least 2 extra units.  We changed his I/C ration from 5.5 to 4.5, which will give him approx.  2 extra units of insulin for his lunch time meal.   He will switch to the U-200 insulin after this pod expires tonight around 7PM.  They had no final questions.

## 2021-09-16 ENCOUNTER — Encounter: Payer: Medicare Other | Admitting: Nutrition

## 2021-09-17 ENCOUNTER — Telehealth: Payer: Self-pay | Admitting: Nutrition

## 2021-09-17 ENCOUNTER — Other Ambulatory Visit: Payer: Self-pay | Admitting: Internal Medicine

## 2021-09-17 NOTE — Telephone Encounter (Signed)
Wife reports that husband is going through a pod in 2 days. I looked at [facility], and it says his daily dose for the last 2 weeks is 65.6u of U-200 insulin.  This X3 days is 196.8u.  He is dialing a U-200 pen that is dialing in U100 dosage.  He was told to fill the pod with 2X 196.8u or 394u, plus 20u that is not used by the pod, and extra in case he eats more and/or gets sick, requiring more insulin.   He was told to fill the pod with 435u from the U-200 pen, which should last him for 3 days.

## 2021-09-17 NOTE — Telephone Encounter (Signed)
Patient is saying they are running out of insulin, and only has 2 pens remaining.  HE will need an update in his order.  He is using 65.6u of insulin from the pod daily and filling his pod with 3 days, which is 196.8u  The pen dials in 1/2 the volume. So he is filling the pod with 2X 196.8or 393.6 plus extra. I told him to fill the pod with 430u for 3 days.   Please update this amount to his pharmacy tomorrow.  He is also running out of pods as well.  I have called [facility] to see if I can get more pods for him.  Not sure if I can.   Thank you

## 2021-09-18 ENCOUNTER — Other Ambulatory Visit: Payer: Self-pay | Admitting: Internal Medicine

## 2021-09-18 MED ORDER — HUMALOG KWIKPEN 200 UNIT/ML ~~LOC~~ SOPN
PEN_INJECTOR | SUBCUTANEOUS | 3 refills | Status: DC
Start: 2021-09-18 — End: 2022-02-02

## 2021-09-19 ENCOUNTER — Telehealth: Payer: Self-pay | Admitting: Dietician

## 2021-09-19 NOTE — Telephone Encounter (Signed)
Updated rx sent to preferred pharmacy

## 2021-09-19 NOTE — Telephone Encounter (Signed)
Patient's wife left a message on our answering service. Patient is running out of insulin when new pod expires on Monday.  She states that they went to the pharmacy and they did not have the prescritpion for his insulin (insulin lispro (Humalog kwikpen) 200 unit/ml.).  They are using the pen to fill the pods.  Chart reviewed.  Prescription for his insulin was sent to the pharmacy 09/18/2021. Called the pharmacy who has the prescription but states that they cannot fill it until Sunday as insurance states that this is too early today.  They stated that patient can call their insurance company to get an override to obtain the insulin sooner.  Called and spoke to patient and his wife.  Discussed that they can get the insulin prescription filled Sunday.  Recommended that they call their insurance to obtain an override to obtain the insulin early which may be needed in the event of a pod failure.  Patient to call for further questions.  [name], RD, LDN, CDCES

## 2021-09-29 ENCOUNTER — Other Ambulatory Visit: Payer: Self-pay | Admitting: Family Medicine

## 2021-10-01 ENCOUNTER — Other Ambulatory Visit: Payer: Self-pay | Admitting: Cardiovascular Disease

## 2021-10-04 ENCOUNTER — Other Ambulatory Visit: Payer: Self-pay | Admitting: Internal Medicine

## 2021-11-06 ENCOUNTER — Encounter: Payer: Self-pay | Admitting: Internal Medicine

## 2021-11-06 ENCOUNTER — Ambulatory Visit (INDEPENDENT_AMBULATORY_CARE_PROVIDER_SITE_OTHER): Payer: Medicare Other | Admitting: Internal Medicine

## 2021-11-06 VITALS — BP 120/74 | HR 65 | Ht 69.5 in | Wt 176.6 lb

## 2021-11-06 DIAGNOSIS — E1159 Type 2 diabetes mellitus with other circulatory complications: Secondary | ICD-10-CM

## 2021-11-06 DIAGNOSIS — E785 Hyperlipidemia, unspecified: Secondary | ICD-10-CM

## 2021-11-06 DIAGNOSIS — Z794 Long term (current) use of insulin: Secondary | ICD-10-CM | POA: Diagnosis not present

## 2021-11-06 DIAGNOSIS — I25118 Atherosclerotic heart disease of native coronary artery with other forms of angina pectoris: Secondary | ICD-10-CM | POA: Diagnosis not present

## 2021-11-06 LAB — POCT GLYCOSYLATED HEMOGLOBIN (HGB A1C): Hemoglobin A1C: 6.2 % — AB (ref 4.0–5.6)

## 2021-11-06 NOTE — Patient Instructions (Addendum)
Please continue: - Farxiga 10 mg before b'fast - Metformin 1500 mg with dinner  Use the following pump settings: - Basal rates: 12 am: 2 units/h - Insulin to carb ratio: 12 am: 1:9 >> 1:8 - Target: 12 am: 110-120 - Correction factor (insulin sensitivity factor):  12 am: 70 - Active insulin time: 4h - Changes infusion site: q3 days  Please return in 3-4 months.

## 2021-11-06 NOTE — Progress Notes (Signed)
Patient ID: Luis Wise, male   DOB: [birth year], 81 y.o.   MRN: [id number]   HPI: Luis Wise is a 81 y.o.-year-old male, initially referred by his PCP, Luis Wise, returning for follow-up: DM2, dx in 2004-2005, insulin-dependent, uncontrolled, with complications (CAD - s/p CABG x5, CKD).  He is here with his wife, who is a former Nurse, learning disability and offers part of the history especially related to medications, blood sugars, and diet.  Last visit 3 months ago.  Interim history: No increased urination, blurry vision, nausea, chest pain.  Insulin pump: - Omnipod 5  CGM: - Dexcom G6  Insulin: - Humalog U200 now  Reviewed HbA1c levels: Lab Results  Component Value Date   HGBA1C 6.5 (A) 08/07/2021   HGBA1C 7.5 (A) 04/03/2021   HGBA1C 7.7 (H) 01/08/2021   HGBA1C 7.2 (A) 11/27/2020   HGBA1C 7.5 (A) 08/29/2020   HGBA1C 8.4 (A) 07/01/2020   HGBA1C 6.9 (A) 03/11/2020   HGBA1C 7.0 (H) 01/26/2020   HGBA1C 7.0 (H) 01/02/2020   HGBA1C 6.7 (A) 07/18/2019   Patient was on a regimen of: - Metformin 500 mg in am and 1000 mg in pm >> 1500 mg at dinnertime - Farxiga 5 >> 10 mg before breakfast-added 03/2020 - Glimepiride 4 mg in am - NovoLog (16)-(16)-(14-16) before B-L-D - Tresiba 10 >> 26 >> 30 >> 32 >> 30 units daily-added 06/2020 >> now 24 units She was also tried on Rybelsus but this was expensive.  He then tried Ozempic but he developed nausea and vomiting and then increase lipase on 02/27/2020 (82  >> 119) on the 0.5 mg dose.  Ozempic was stopped at that time. Prev. On Januvia >> tolerated well but expensive. We stopped glimepiride when starting mealtime insulin  Currently on: - Farxiga 10 mg before b'fast - Metformin 1500 mg with dinner  - Insulin pump: - Basal rates: 12 am: 2 units/h - Insulin to carb ratio: 12 am: 1:10 >> 1:9 - Target: 12 am: 120-150 >> 110-120 - Correction factor (insulin sensitivity factor):  12 am: 35 >> 70 - Active insulin time: 4h - Changes infusion  site: q3 days  Total daily dose from basal insulin: 24 units Total daily dose from bolus insulin: 25-36 units  Pt checks his sugars >4x a day:  Prev.:     Lowest sugar was 50s before sx >> 129 >> 119 >> 73 >> 70 >> 70s; he has hypoglycemia awareness in the 60s.  Highest sugar was 347 (milkshake) >> 278 >> 200s >> 250 >> 300 (chinese foods)  Glucometer: OneTouch ultra  Pt's meals are: - Breakfast: 1 egg + toast or cereal or oatmeal - Lunch: grilled fish + veggies - Dinner: meat + veggies - Snacks: 2: nuts, wheat crackers He walks 3 miles 3 days a week for exercise.  He finished cardiac rehab.   -No CKD, last BUN/creatinine:  Lab Results  Component Value Date   BUN 27 (H) 01/08/2021   BUN 29 (H) 05/10/2020   CREATININE 1.30 01/08/2021   CREATININE 1.29 05/10/2020  On lisinopril 20 twice a day.  -+ HL; last set of lipids: Lab Results  Component Value Date   CHOL 112 01/08/2021   HDL 20.20 (L) 01/08/2021   LDLCALC 66 04/11/2020   LDLDIRECT 35.0 01/08/2021   TRIG 356.0 (H) 01/08/2021   CHOLHDL 6 01/08/2021  On atorvastatin 80 mg daily, fenofibrate 160, co-Q10  On ASA 81.  - last eye exam was in 11/2020: No DR reportedly  -  no numbness and tingling in his feet.  Last foot exam January 13, 2021.  Pt has FH of DM in father in his 84s.  He was taken off amiodarone.    ROS: + See HPI  I reviewed pt's medications, allergies, PMH, social hx, family hx, and changes were documented in the history of present illness. Otherwise, unchanged from my initial visit note.  Past Medical History:  Diagnosis Date   Allergy    Amoxicillin   Cataract 2017   Mild progression   Coronary artery disease    a. s/p 5-vessel CABG (LIMA-LAD, SVG-diag, sequential SVG-OM1-OM 2, & SVG-PDA)   Diabetes mellitus with complication (HCC)    Hyperlipidemia    Hypertension    Insomnia    neuropathy    bilateral feet   Postoperative atrial fibrillation [facility])    Past Surgical History:   Procedure Laterality Date   BACK SURGERY     CARDIAC CATHETERIZATION     01/19/2020   COLONOSCOPY WITH PROPOFOL N/A 04/01/2021   Procedure: COLONOSCOPY WITH PROPOFOL;  Surgeon: Midge Minium, MD;  Location: [facility] ENDOSCOPY;  Service: Endoscopy;  Laterality: N/A;   CORONARY ARTERY BYPASS GRAFT N/A 01/29/2020   Procedure: CORONARY ARTERY BYPASS GRAFTING (CABG) TIMES FIVE USING LIMA to LAD; ENDOSCOPIC HARVESTED GREATER SAPHENOUS VEIN: SVG to PDA; SVG to sequenced OM1 & OM2.;  Surgeon: Kerin Perna, MD;  Location: [facility] OR;  Service: Open Heart Surgery;  Laterality: N/A;   ENDOVEIN HARVEST OF GREATER SAPHENOUS VEIN Bilateral 01/29/2020   Procedure: ENDOVEIN HARVEST OF GREATER SAPHENOUS VEIN;  Surgeon: Kerin Perna, MD;  Location: [facility] OR;  Service: Open Heart Surgery;  Laterality: Bilateral;   EYE SURGERY  Radial K   1997   LAMINECTOMY  1993   L5-S1   LEFT HEART CATH AND CORONARY ANGIOGRAPHY N/A 01/19/2020   Procedure: LEFT HEART CATH AND CORONARY ANGIOGRAPHY;  Surgeon: Iran Ouch, MD;  Location: [facility] INVASIVE CV LAB;  Service: Cardiovascular;  Laterality: N/A;   MENISCUS REPAIR  1996   TEE WITHOUT CARDIOVERSION N/A 01/29/2020   Procedure: TRANSESOPHAGEAL ECHOCARDIOGRAM (TEE);  Surgeon: Donata Clay, Theron Arista, MD;  Location: [facility] OR;  Service: Open Heart Surgery;  Laterality: N/A;   TONSILLECTOMY     Social History   Socioeconomic History   Marital status: Married    Spouse name: Not on file   Number of children: Not on file   Years of education: Not on file   Highest education level: Not on file  Occupational History   Occupation: CFO  Tobacco Use   Smoking status: Never   Smokeless tobacco: Never  Vaping Use   Vaping Use: Never used  Substance and Sexual Activity   Alcohol use: Not Currently    Alcohol/week: 0.0 standard drinks of alcohol    Comment: Very rare- occasionaly beer or wine    Drug use: No   Sexual activity: Yes    Birth control/protection: None  Other Topics  Concern   Not on file  Social History Narrative   Married 1970.   [employer], Scientist, water quality at [employer].   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   Social Determinants of Health   Financial Resource Strain: Low Risk  (11/30/2020)   Overall Financial Resource Strain (CARDIA)    Difficulty of Paying Living Expenses: Not hard at all  Food Insecurity: No Food Insecurity (11/30/2020)   Hunger Vital Sign  Worried About Programme researcher, broadcasting/film/video in the Last Year: Never true    Ran Out of Food in the Last Year: Never true  Transportation Needs: No Transportation Needs (11/30/2020)   PRAPARE - Administrator, Civil Service (Medical): No    Lack of Transportation (Non-Medical): No  Physical Activity: Insufficiently Active (11/30/2020)   Exercise Vital Sign    Days of Exercise per Week: 2 days    Minutes of Exercise per Session: 60 min  Stress: No Stress Concern Present (11/30/2020)   [employer] of Occupational Health - Occupational Stress Questionnaire    Feeling of Stress : Not at all  Social Connections: Socially Integrated (11/30/2020)   Social Connection and Isolation Panel [NHANES]    Frequency of Communication with Friends and Family: More than three times a week    Frequency of Social Gatherings with Friends and Family: Never    Attends Religious Services: More than 4 times per year    Active Member of [employer] or Organizations: Yes    Attends Engineer, structural: More than 4 times per year    Marital Status: Married  Catering manager Violence: Not At Risk (11/30/2020)   Humiliation, Afraid, Rape, and Kick questionnaire    Fear of Current or Ex-Partner: No    Emotionally Abused: No    Physically Abused: No    Sexually Abused: No   Current Outpatient Medications on File Prior to Visit  Medication Sig Dispense Refill   amLODipine (NORVASC) 10 MG tablet  Take 1 tablet (10 mg total) by mouth daily. 90 tablet 3   atorvastatin (LIPITOR) 80 MG tablet Take 1 tablet (80 mg total) by mouth daily. 90 tablet 3   carvedilol (COREG) 12.5 MG tablet Take 1 tablet (12.5 mg total) by mouth 2 (two) times daily. 180 tablet 3   cholecalciferol (VITAMIN D3) 25 MCG (1000 UNIT) tablet Take 1,000 Units by mouth daily.     Coenzyme Q10 (COQ10) 100 MG CAPS Take 100 mg by mouth daily.      Continuous Blood Gluc Transmit (DEXCOM G6 TRANSMITTER) MISC 1 Device by Does not apply route every 3 (three) months. E11.65 1 each 3   FARXIGA 10 MG TABS tablet TAKE 1 TABLET BY MOUTH EVERY DAY BEFORE BREAKFAST 30 tablet 11   fenofibrate 160 MG tablet TAKE 1 TABLET BY MOUTH EVERY DAY 90 tablet 1   glimepiride (AMARYL) 4 MG tablet TAKE 1 TABLET BY MOUTH BEFORE BREAKFAST AND SUPPER 180 tablet 3   glucose blood (ONETOUCH ULTRA) test strip TEST 2X A DAY 200 strip 3   GVOKE HYPOPEN 1-PACK 1 MG/0.2ML SOAJ Inject 0.2 mg under skin as needed for low blood sugars 0.2 mL prn   Insulin Disposable Pump (OMNIPOD 5 G6 POD, GEN 5,) MISC Change pod every 2 days 30 each 3   insulin lispro (HUMALOG KWIKPEN) 200 UNIT/ML KwikPen Use as instructed in the insulin pump 66 mL 3   Insulin Pen Needle 32G X 4 MM MISC Use 4x a day (Patient not taking: Reported on 08/18/2021) 300 each 3   lisinopril (ZESTRIL) 20 MG tablet Take 1 tablet (20 mg total) by mouth daily. 90 tablet 3   lisinopril-hydrochlorothiazide (ZESTORETIC) 20-25 MG tablet Take 1 tablet by mouth daily. 90 tablet 3   metFORMIN (GLUCOPHAGE) 1000 MG tablet TAKE 1 TABLET BY MOUTH TWICE A DAY 180 tablet 3   Multiple Vitamins-Minerals (PRESERVISION AREDS 2 PO) Take 1 tablet by mouth 2 (two) times daily.  Omega-3 Fatty Acids (FISH OIL) 1000 MG CAPS Take 1-2 capsules (1,000-2,000 mg total) by mouth in the morning and at bedtime.  0   sildenafil (REVATIO) 20 MG tablet TAKE ONE TABLET BY MOUTH THREE TIMES A DAY AS NEEDED 90 tablet 0   traZODone (DESYREL)  100 MG tablet Take 1 tablet (100 mg total) by mouth at bedtime. 90 tablet 3   Zinc Oxide 15 MG TBDP 2 tabs per day     No current facility-administered medications on file prior to visit.   Allergies  Allergen Reactions   Ozempic (0.25 Or 0.5 Mg-Dose) [Semaglutide(0.25 Or 0.5mg -Dos)]     Presumed cause of GI upset.     Amoxil [Amoxicillin] Rash   Family History  Problem Relation Age of Onset   Hypertension Mother    Diabetes Father    Cancer Neg Hx    COPD Neg Hx    Heart disease Neg Hx    Hyperlipidemia Neg Hx    Stroke Neg Hx    Colon cancer Neg Hx    Prostate cancer Neg Hx    PE: BP 120/74 (BP Location: Right Arm, Patient Position: Sitting, Cuff Size: Normal)   Pulse 65   Ht 5' 9.5" (1.765 m)   Wt 176 lb 9.6 oz (80.1 kg)   SpO2 90%   BMI 25.71 kg/m  Wt Readings from Last 3 Encounters:  11/06/21 176 lb 9.6 oz (80.1 kg)  08/18/21 172 lb (78 kg)  08/07/21 173 lb 3.2 oz (78.6 kg)   Constitutional: normal weight, in NAD Eyes: PERRLA, EOMI, no exophthalmos ENT: moist mucous membranes, no thyromegaly, no cervical lymphadenopathy Cardiovascular: RRR, No MRG Respiratory: CTA B Musculoskeletal: no deformities Skin: moist, warm, no rashes Neurological: no tremor with outstretched hands Diabetic Foot Exam - Simple   Simple Foot Form Diabetic Foot exam was performed with the following findings: Yes 11/06/2021 11:37 AM  Visual Inspection No deformities, no ulcerations, no other skin breakdown bilaterally: Yes Sensation Testing Intact to touch and monofilament testing bilaterally: Yes Pulse Check Posterior Tibialis and Dorsalis pulse intact bilaterally: Yes Comments     ASSESSMENT: 1. DM2, insulin-dependent, uncontrolled, with complications - CAD, s/p CABG x5 - CKD  Component     Latest Ref Rng & Units 08/29/2020          Glucose     65 - 99 mg/dL 709 (H)  Hemoglobin G2E     4.0 - 5.6 % 7.5 (A)  C-Peptide     0.80 - 3.85 ng/mL 4.30 (H)  Islet Cell Ab      Neg:<1:1 Negative  ZNT8 Antibodies     <15 U/mL <10  Glutamic Acid Decarb Ab     <5 IU/mL <5  No insulin deficiency or pancreatic autoimmunity.  He has good insulin production.  Of note, we cannot use a GLP-1 receptor agonist for him due to previously elevated lipase.  2. HL  PLAN:  1. Patient with longstanding, uncontrolled, type 2 diabetes, on oral antidiabetic regimen with metformin and SGLT2 inhibitor and also now OmniPod insulin pump.  This was restarted at last visit. -At that time, sugars appears to be worse in the previous 2 weeks and they were increasing after every meal, but especially after dinner.  At that time he was using injections again and I strongly advised him to go back to the OmniPod and discussed about strengthening his insulin to carb ratios.  Otherwise, the previous pump settings appears to be adequate so we continued them.  HbA1c at that time was already much better, at 6.5%. CGM interpretation: -At today's visit, we reviewed his CGM downloads: It appears that 86% of values are in target range (goal >70%), while 18% are higher than 180 (goal <25%), and 0% are lower than 70 (goal <4%).  The calculated average blood sugar is 141.  The projected HbA1c for the next 3 months (GMI) is 6.7%. -Reviewing the CGM trends, sugars are excellent overnight, all in the target range, but they increase slightly after breakfast and particularly so after lunch.  Mid afternoon, he may have hyperglycemic peaks, but this is rare, however, after an initial slight increase after dinner, sugars are decreasing to the target range. -We discussed that this pattern is usually seen when the insulin to carb ratios are not strong enough.  We could strengthen them along the lower (he already strengthen them after last visit and after switching to U200 insulin).  Now, we will change the insulin to carb ratio from 1: 9 to 1: 8.  I advised him to decrease to 1: 7.5 if the sugars remain high afterwards.  The  rest of the settings can be maintained for now. - I suggested to:  Patient Instructions  Please continue: - Farxiga 10 mg before b'fast - Metformin 1500 mg with dinner  Use the following pump settings: - Basal rates: 12 am: 2 units/h - Insulin to carb ratio: 12 am: 1:9 >> 1:8 - Target: 12 am: 110-120 - Correction factor (insulin sensitivity factor):  12 am: 70 - Active insulin time: 4h - Changes infusion site: q3 days  Please return in 3-4 months.  - we checked his HbA1c: 6.2% (excellent) - advised to check sugars at different times of the day - 4x a day, rotating check times - advised for yearly eye exams >> he is UTD - return to clinic in 3-4 months  2. HL -Reviewed latest lipid panel from 01/2021: LDL at goal, triglycerides high, HDL low: Lab Results  Component Value Date   CHOL 112 01/08/2021   HDL 20.20 (L) 01/08/2021   LDLCALC 66 04/11/2020   LDLDIRECT 35.0 01/08/2021   TRIG 356.0 (H) 01/08/2021   CHOLHDL 6 01/08/2021  -He is on atorvastatin 80 mg daily and fenofibrate 160 mg daily with no side effects  Carlus Pavlovristina Claudina Oliphant, MD PhD [facility]

## 2021-11-25 ENCOUNTER — Telehealth: Payer: Self-pay | Admitting: Cardiovascular Disease

## 2021-11-25 ENCOUNTER — Ambulatory Visit: Payer: Medicare Other | Attending: Medical | Admitting: Medical

## 2021-11-25 ENCOUNTER — Encounter: Payer: Self-pay | Admitting: Medical

## 2021-11-25 VITALS — BP 136/62 | HR 61 | Ht 70.0 in | Wt 181.0 lb

## 2021-11-25 DIAGNOSIS — R011 Cardiac murmur, unspecified: Secondary | ICD-10-CM | POA: Insufficient documentation

## 2021-11-25 DIAGNOSIS — I25118 Atherosclerotic heart disease of native coronary artery with other forms of angina pectoris: Secondary | ICD-10-CM | POA: Diagnosis not present

## 2021-11-25 DIAGNOSIS — E782 Mixed hyperlipidemia: Secondary | ICD-10-CM | POA: Insufficient documentation

## 2021-11-25 DIAGNOSIS — M79602 Pain in left arm: Secondary | ICD-10-CM | POA: Insufficient documentation

## 2021-11-25 DIAGNOSIS — Z951 Presence of aortocoronary bypass graft: Secondary | ICD-10-CM | POA: Insufficient documentation

## 2021-11-25 DIAGNOSIS — I10 Essential (primary) hypertension: Secondary | ICD-10-CM | POA: Diagnosis not present

## 2021-11-25 NOTE — Progress Notes (Signed)
Cardiology Office Note:    Date:  11/25/2021   ID:  VA BROADWELL, DOB 1941-02-06, MRN [id number]  PCP:  Tonia Ghent, MD  [facility] Cardiologist:  Ida Rogue, MD  [place] Electrophysiologist:  None   Referring MD: Tonia Ghent, MD   Chief Complaint: chest pain follow-up  History of Present Illness:    Luis Wise is a 81 y.o. male with a hx of DM2, HTN, HLD, CADs/p CABG x 5 who presents for follow-up.   The patient had a Cardiac CTA with 3V CAD and calcium score of 5706. In this setting he underwent diagnostic LHC on 01/19/2020 which showed severe heavily calcified three-vessel CAD with CTO of the proximal/mid LAD with bridging collaterals as well as faint right to left collaterals.  Normal LV systolic function with mildly elevated LVEDP at 13 mmHg.  Given his diabetic status and heavily calcified three-vessel CAD with CTO of the LAD it was recommended he be evaluated for CABG.  He was evaluated by CVTS on 01/23/2020 and felt to be an acceptable surgical candidate.  Preoperative echo on 07/26/2019 showed an EF of 55 to 60%, mild concentric LVH, grade 1 diastolic dysfunction, mild hypokinesis of the left ventricular apical septal wall, normal RV systolic function and ventricular cavity size, and trivial mitral regurgitation.  He subsequently underwent 5 vessel CABG on 01/29/2020 by Dr. Prescott Gum.  Postoperative intraoperative TEE was unchanged from preprocedure surface echo.  Postoperatively he developed A. fib and was treated with amiodarone bolus and drip with subsequent conversion to sinus rhythm.  Upon transferring to the floor he redeveloped A. fib that was brief and converted to sinus rhythm with oral medications.  During his admission he was hypertensive with subsequent titration of Lopressor.  Last seen 08/18/21 and was overall doing well from a cardiac standpoint.   Today, the patient reports left sided arm discomfort. Chest pain started 10 days ago. It feels  like a numbness and tingling. He has a continuous glucose monitor that used to be on the left shoulder, it is now on the right side. Arm discomfort is worse after sleeping on the arm Since moving the glucose monitor to the right arm, the discomfort has gotten better. Has not taken anything for it. The arm pain is not worse with exertion. No SOB, lower leg edema, orthopnea or pnd. He walks for 3 miles a couple times a week and has no anginal symptoms with this. He is not taking ASA, unsure why.   Past Medical History:  Diagnosis Date   Allergy    Amoxicillin   Cataract 2017   Mild progression   Coronary artery disease    a. s/p 5-vessel CABG (LIMA-LAD, SVG-diag, sequential SVG-OM1-OM 2, & SVG-PDA)   Diabetes mellitus with complication (HCC)    Hyperlipidemia    Hypertension    Insomnia    neuropathy    bilateral feet   Postoperative atrial fibrillation [facility])     Past Surgical History:  Procedure Laterality Date   BACK SURGERY     CARDIAC CATHETERIZATION     01/19/2020   COLONOSCOPY WITH PROPOFOL N/A 04/01/2021   Procedure: COLONOSCOPY WITH PROPOFOL;  Surgeon: Lucilla Lame, MD;  Location: [facility] ENDOSCOPY;  Service: Endoscopy;  Laterality: N/A;   CORONARY ARTERY BYPASS GRAFT N/A 01/29/2020   Procedure: CORONARY ARTERY BYPASS GRAFTING (CABG) TIMES FIVE USING LIMA to LAD; ENDOSCOPIC HARVESTED GREATER SAPHENOUS VEIN: SVG to PDA; SVG to sequenced OM1 & OM2.;  Surgeon: Prescott Gum,  Theron AristaPeter, MD;  [facility];  Service: Open Heart Surgery;  Laterality: N/A;   ENDOVEIN HARVEST OF GREATER SAPHENOUS VEIN Bilateral 01/29/2020   Procedure: ENDOVEIN HARVEST OF GREATER SAPHENOUS VEIN;  Surgeon: Kerin PernaVan Trigt, Peter, MD;  [facility];  Service: Open Heart Surgery;  Laterality: Bilateral;   EYE SURGERY  Radial K   1997   LAMINECTOMY  1993   L5-S1   LEFT HEART CATH AND CORONARY ANGIOGRAPHY N/A 01/19/2020   Procedure: LEFT HEART CATH AND CORONARY ANGIOGRAPHY;  Surgeon: Iran OuchArida, Muhammad A, MD;  [facility] INV[facility] LAB;  Service: Cardiovascular;  Laterality: N/A;   MENISCUS REPAIR  1996   TEE WITHOUT CARDIOVERSION N/A 01/29/2020   Procedure: TRANSESOPHAGEAL ECHOCARDIOGRAM (TEE);  Surgeon: Donata ClayVan Trigt, Theron AristaPeter, MD;  [facility];  Service: Open Heart Surgery;  Laterality: N/A;   TONSILLECTOMY      Current Medications: Current Meds  Medication Sig   amLODipine (NORVASC) 10 MG tablet Take 1 tablet (10 mg total) by mouth daily.   atorvastatin (LIPITOR) 80 MG tablet Take 1 tablet (80 mg total) by mouth daily.   carvedilol (COREG) 12.5 MG tablet Take 1 tablet (12.5 mg total) by mouth 2 (two) times daily.   cholecalciferol (VITAMIN D3) 25 MCG (1000 UNIT) tablet Take 1,000 Units by mouth daily.   Coenzyme Q10 (COQ10) 100 MG CAPS Take 100 mg by mouth daily.    Continuous Blood Gluc Transmit (DEXCOM G6 TRANSMITTER) MISC 1 Device by Does not apply route every 3 (three) months. E11.65   FARXIGA 10 MG TABS tablet TAKE 1 TABLET BY MOUTH EVERY DAY BEFORE BREAKFAST   fenofibrate 160 MG tablet TAKE 1 TABLET BY MOUTH EVERY DAY   glimepiride (AMARYL) 4 MG tablet TAKE 1 TABLET BY MOUTH BEFORE BREAKFAST AND SUPPER   glucose blood (ONETOUCH ULTRA) test strip TEST 2X A DAY   GVOKE HYPOPEN 1-PACK 1 MG/0.2ML SOAJ Inject 0.2 mg under skin as needed for low blood sugars   Insulin Disposable Pump (OMNIPOD 5 G6 POD, GEN 5,) MISC Change pod every 2 days   insulin lispro (HUMALOG KWIKPEN) 200 UNIT/ML KwikPen Use as instructed in the insulin pump   Insulin Pen Needle 32G X 4 MM MISC Use 4x a day   lisinopril (ZESTRIL) 20 MG tablet Take 1 tablet (20 mg total) by mouth daily.   lisinopril-hydrochlorothiazide (ZESTORETIC) 20-25 MG tablet Take 1 tablet by mouth daily.   metFORMIN (GLUCOPHAGE) 1000 MG tablet TAKE 1 TABLET BY MOUTH TWICE A DAY   Multiple Vitamins-Minerals (PRESERVISION AREDS 2 PO) Take 1 tablet by mouth 2 (two) times daily.    Omega-3 Fatty Acids (FISH OIL) 1000 MG CAPS Take 1-2 capsules (1,000-2,000 mg  total) by mouth in the morning and at bedtime.   sildenafil (REVATIO) 20 MG tablet TAKE ONE TABLET BY MOUTH THREE TIMES A DAY AS NEEDED   traZODone (DESYREL) 100 MG tablet Take 1 tablet (100 mg total) by mouth at bedtime.   Zinc Oxide 15 MG TBDP 2 tabs per day     Allergies:   Ozempic (0.25 or 0.5 mg-dose) [semaglutide(0.25 or 0.5mg -dos)] and Amoxil [amoxicillin]   Social History   Socioeconomic History   Marital status: Married    Spouse name: Not on file   Number of children: Not on file   Years of education: Not on file   Highest education level: Not on file  Occupational History   Occupation: CFO  Tobacco Use   Smoking status: Never   Smokeless  tobacco: Never  Vaping Use   Vaping Use: Never used  Substance and Sexual Activity   Alcohol use: Not Currently    Alcohol/week: 0.0 standard drinks of alcohol    Comment: Very rare- occasionaly beer or wine    Drug use: No   Sexual activity: Yes    Birth control/protection: None  Other Topics Concern   Not on file  Social History Narrative   Married 1970.   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   Social Determinants of Health   Financial Resource Strain: Low Risk  (11/30/2020)   Overall Financial Resource Strain (CARDIA)    Difficulty of Paying Living Expenses: Not hard at all  Food Insecurity: No Food Insecurity (11/30/2020)   Hunger Vital Sign    Worried About Running Out of Food in the Last Year: Never true    Ran Out of Food in the Last Year: Never true  Transportation Needs: No Transportation Needs (11/30/2020)   PRAPARE - Administrator, Civil Service (Medical): No    Lack of Transportation (Non-Medical): No  Physical Activity: Insufficiently Active (11/30/2020)   Exercise Vital Sign    Days of Exercise per Week: 2 days    Minutes of Exercise per Session:  60 min  Stress: No Stress Concern Present (11/30/2020)   [employer] of Occupational Health - Occupational Stress Questionnaire    Feeling of Stress : Not at all  Social Connections: Socially Integrated (11/30/2020)   Social Connection and Isolation Panel [NHANES]    Frequency of Communication with Friends and Family: More than three times a week    Frequency of Social Gatherings with Friends and Family: Never    Attends Religious Services: More than 4 times per year    Active Member of [employer] or Organizations: Yes    Attends Engineer, structural: More than 4 times per year    Marital Status: Married     Family History: The patient's family history includes Diabetes in his father; Hypertension in his mother. There is no history of Cancer, COPD, Heart disease, Hyperlipidemia, Stroke, Colon cancer, or Prostate cancer.  ROS:   Please see the history of present illness.     All other systems reviewed and are negative.  EKGs/Labs/Other Studies Reviewed:    The following studies were reviewed today:  Intraop echo 01/2020 Complications: No known complications during this procedure.  POST-OP IMPRESSIONS  - Left Ventricle: The left ventricle is unchanged from pre-bypass.  - Right Ventricle: The right ventricle appears unchanged from pre-bypass.  - Aorta: The aorta appears unchanged from pre-bypass.  - Left Atrium: The left atrium appears unchanged from pre-bypass.  - Left Atrial Appendage: The left atrial appendage appears unchanged from  pre-bypass.  - Aortic Valve: The aortic valve appears unchanged from pre-bypass.  - Mitral Valve: The mitral valve appears unchanged from pre-bypass.  - Tricuspid Valve: The tricuspid valve appears unchanged from pre-bypass.  - Interatrial Septum: The interatrial septum appears unchanged from  pre-bypass.  - Interventricular Septum: The interventricular septum appears unchanged  from  pre-bypass.  - Pericardium: The pericardium appears  unchanged from pre-bypass.   Echo 01/2020  1. Left ventricular ejection fraction, by estimation, is 55 to 60%. The  left ventricle has normal function. The left ventricle demonstrates  regional wall motion abnormalities (see scoring diagram/findings for  description). There  is mild concentric left  ventricular hypertrophy. Left ventricular diastolic parameters are  consistent with Grade I diastolic dysfunction (impaired relaxation). There  is mild hypokinesis of the left ventricular, apical septal wall.   2. Right ventricular systolic function is normal. The right ventricular  size is normal.   3. The mitral valve is normal in structure. Trivial mitral valve  regurgitation. No evidence of mitral stenosis.   4. The aortic valve is normal in structure. Aortic valve regurgitation is  not visualized. No aortic stenosis is present.   5. The inferior vena cava is normal in size with greater than 50%  respiratory variability, suggesting right atrial pressure of 3 mmHg.   LHC 01/2020 Prox RCA lesion is 60% stenosed. Dist RCA lesion is 50% stenosed. RPDA lesion is 50% stenosed. Mid RCA lesion is 70% stenosed. 2nd Mrg lesion is 80% stenosed. Dist Cx lesion is 80% stenosed. Prox LAD to Mid LAD lesion is 100% stenosed. 1st Diag lesion is 80% stenosed. Prox LAD lesion is 70% stenosed. The left ventricular systolic function is normal. LV end diastolic pressure is mildly elevated. The left ventricular ejection fraction is 55-65% by visual estimate.   1.  Severe heavily calcified three-vessel coronary artery disease.  Chronic occlusion of the proximal/mid LAD with bridging collaterals as well as faint right to left collaterals. 2.  Normal LV systolic function and mildly elevated left ventricular end-diastolic pressure at 13 mmHg.   Recommendations: Given diabetic status, heavily calcified 3 vessels disease and chronic occlusion of the LAD, I think the best option is CABG.  The anterior wall  motion appears to be normal and thus the LAD territory seems to be viable. I instructed the patient to discontinue clopidogrel and continue aspirin. Consult CVTS for CABG.    EKG:  EKG is ordered today.  The ekg ordered today demonstrates NSR 61bpm, LBBB, LAD, no changes  Recent Labs: 01/08/2021: ALT 40; BUN 27; Creatinine, Ser 1.30; Hemoglobin 15.0; Platelets 291.0; Potassium 4.0; Sodium 137; TSH 1.45  Recent Lipid Panel    Component Value Date/Time   CHOL 112 01/08/2021 0821   CHOL 151 06/24/2018 0842   TRIG 356.0 (H) 01/08/2021 0821   TRIG 161 (H) 06/24/2018 0842   HDL 20.20 (L) 01/08/2021 0821   HDL 31 (L) 06/09/2017 1133   CHOLHDL 6 01/08/2021 0821   VLDL 71.2 (H) 01/08/2021 0821   VLDL 32 (H) 06/24/2018 0842   LDLCALC 66 04/11/2020 0825   LDLCALC 77 06/09/2017 1133   LDLDIRECT 35.0 01/08/2021 0821     Physical Exam:    VS:  BP 136/62 (BP Location: Left Arm, Patient Position: Sitting, Cuff Size: Normal)   Pulse 61   Ht 5\' 10"  (1.778 m)   Wt 181 lb (82.1 kg)   SpO2 95%   BMI 25.97 kg/m     Wt Readings from Last 3 Encounters:  11/25/21 181 lb (82.1 kg)  11/06/21 176 lb 9.6 oz (80.1 kg)  08/18/21 172 lb (78 kg)     GEN:  Well nourished, well developed in no acute distress HEENT: Normal NECK: No JVD; No carotid bruits LYMPHATICS: No lymphadenopathy CARDIAC: RRR, no murmurs, rubs, gallops RESPIRATORY:  Clear to auscultation without rales, wheezing or rhonchi  ABDOMEN: Soft, non-tender, non-distended MUSCULOSKELETAL:  No edema; No deformity  SKIN: Warm and dry NEUROLOGIC:  Alert and oriented x 3 PSYCHIATRIC:  Normal affect   ASSESSMENT:    1. Left arm pain   2. Murmur   3. Coronary artery disease  of native artery of native heart with stable angina pectoris (HCC)   4. S/P CABG x 5   5. Essential hypertension   6. Hyperlipidemia, mixed    PLAN:    In order of problems listed above:  Left arm discomfort CAD s/p CABG x5 2021 Reports left arm discomfort,  numbness and tingling. He used to have a glucose monitoring device on that arm, but it was moved to the right arm. Pain is worse at night when he sleeps on it, it is not worse with exertion. He walks 3 miles a couple times a week and has no anginal symptoms with this.  EKG with no changes. Low suspicion this is cardiac pain. Sounds more like nerve pain, which may have been from the BG monitor. Overall pain seems to be improving since the BG monitor was moved to the other arm.  If pain is present at follow-up can consider ETT. Restart ASA 81 mg daily. Continue Lipitor 80mg  daily and Coreg 12.5mg  BID.   Murmur I will check an echocardiogram   HLD LDL 35 in 2022. Continue Lipitor 80mg  daily.   HTN BP is good today, continue current medications.   Disposition: Follow up in 6 month(s) with MD/APP     Signed, Jhade Berko 2023, PA-C  11/25/2021 3:53 PM     [facility]

## 2021-11-25 NOTE — Telephone Encounter (Signed)
  Pt c/o of Chest Pain: STAT if CP now or developed within 24 hours  1. Are you having CP right now? No   2. Are you experiencing any other symptoms (ex. SOB, nausea, vomiting, sweating)? None   3. How long have you been experiencing CP? 1 week   4. Is your CP continuous or coming and going? Coming and going   5. Have you taken Nitroglycerin? No    Pt said, he's been experiencing pain on his left arm down to his arm. Both him and his wife said this started a week ago. They made an appt today to see Cadence at 2:20 pm   ?

## 2021-11-25 NOTE — Patient Instructions (Signed)
Medication Instructions:   RESTART Aspririn - Take one tablet (81mg ) by mouth daily.   *If you need a refill on your cardiac medications before your next appointment, please call your pharmacy*   Lab Work:  None Ordered  If you have labs (blood work) drawn today and your tests are completely normal, you will receive your results only by: [place] (if you have MyChart) OR A paper copy in the mail If you have any lab test that is abnormal or we need to change your treatment, we will call you to review the results.   Testing/Procedures:  Echocardiogram  Your physician has requested that you have an echocardiogram. Echocardiography is a painless test that uses sound waves to create images of your heart. It provides your doctor with information about the size and shape of your heart and how well your heart's chambers and valves are working. This procedure takes approximately one hour. There are no restrictions for this procedure. Please note; depending on visual quality an IV may need to be placed.     Follow-Up: At [facility], you and your health needs are our priority.  As part of our continuing mission to provide you with exceptional heart care, we have created designated Provider Care Teams.  These Care Teams include your primary Cardiologist (physician) and Advanced Practice Providers (APPs -  Physician Assistants and Nurse Practitioners) who all work together to provide you with the care you need, when you need it.  We recommend signing up for the patient portal called "MyChart".  Sign up information is provided on this After Visit Summary.  MyChart is used to connect with patients for Virtual Visits (Telemedicine).  Patients are able to view lab/test results, encounter notes, upcoming appointments, etc.  Non-urgent messages can be sent to your provider as well.   To learn more about what you can do with MyChart, go to [email or web address].    Your next  appointment:   6 month(s)  The format for your next appointment:   In Person  Provider:   You may see Ida Rogue, MD or one of the following Advanced Practice Providers on your designated Care Team:   Murray Hodgkins, NP Christell Faith, PA-C Cadence Kathlen Mody, PA-C Gerrie Nordmann, NP    Important Information About Sugar

## 2021-11-25 NOTE — Telephone Encounter (Signed)
Noted  

## 2021-11-27 ENCOUNTER — Ambulatory Visit: Payer: Medicare Other | Attending: Medical

## 2021-11-27 DIAGNOSIS — R079 Chest pain, unspecified: Secondary | ICD-10-CM | POA: Diagnosis not present

## 2021-11-27 DIAGNOSIS — E785 Hyperlipidemia, unspecified: Secondary | ICD-10-CM | POA: Insufficient documentation

## 2021-11-27 DIAGNOSIS — I10 Essential (primary) hypertension: Secondary | ICD-10-CM | POA: Diagnosis not present

## 2021-11-27 DIAGNOSIS — R011 Cardiac murmur, unspecified: Secondary | ICD-10-CM | POA: Diagnosis not present

## 2021-11-27 DIAGNOSIS — E119 Type 2 diabetes mellitus without complications: Secondary | ICD-10-CM | POA: Diagnosis not present

## 2021-11-27 DIAGNOSIS — Z951 Presence of aortocoronary bypass graft: Secondary | ICD-10-CM | POA: Insufficient documentation

## 2021-11-27 DIAGNOSIS — Z8616 Personal history of COVID-19: Secondary | ICD-10-CM | POA: Diagnosis not present

## 2021-11-27 DIAGNOSIS — I080 Rheumatic disorders of both mitral and aortic valves: Secondary | ICD-10-CM | POA: Insufficient documentation

## 2021-11-27 LAB — ECHOCARDIOGRAM COMPLETE
AR max vel: 2.47 cm2
AV Area VTI: 2.16 cm2
AV Area mean vel: 2.4 cm2
AV Mean grad: 5 mmHg
AV Peak grad: 8 mmHg
Ao pk vel: 1.41 m/s
Area-P 1/2: 2.95 cm2
Calc EF: 64.5 %
S' Lateral: 2.7 cm
Single Plane A2C EF: 63 %
Single Plane A4C EF: 65.3 %

## 2021-11-27 NOTE — Addendum Note (Signed)
Addended by: James Ivanoff D on: 11/27/2021 03:02 PM   Modules accepted: Orders

## 2021-12-01 ENCOUNTER — Telehealth: Payer: Self-pay

## 2021-12-01 NOTE — Telephone Encounter (Signed)
-----   Message from [place], PA-C sent at 11/28/2021  2:34 PM EDT ----- Echo showed mildly reduced EF 45-50% and impaired relaxation with mildly leaky valve. May need to repeat ischemic testing to see if there are new blockages in the heart. Also will need to discuss medications that will help improve the pump function.

## 2021-12-01 NOTE — Telephone Encounter (Signed)
Called to give the pot echo. Results. Lmtcb.

## 2021-12-01 NOTE — Telephone Encounter (Signed)
Patient made aware of echo results and CF recommendation with verbalized understanding.. Pt appt with Dr. Rockey Situ moved up to 12/23/21 @ 11am with Dr. Rockey Situ.

## 2021-12-13 DIAGNOSIS — Z23 Encounter for immunization: Secondary | ICD-10-CM | POA: Diagnosis not present

## 2021-12-22 ENCOUNTER — Ambulatory Visit (INDEPENDENT_AMBULATORY_CARE_PROVIDER_SITE_OTHER): Payer: Medicare Other | Admitting: *Deleted

## 2021-12-22 DIAGNOSIS — Z0000 Encounter for general adult medical examination without abnormal findings: Secondary | ICD-10-CM

## 2021-12-22 NOTE — Patient Instructions (Signed)
Luis Wise , Thank you for taking time to come for your Medicare Wellness Visit. I appreciate your ongoing commitment to your health goals. Please review the following plan we discussed and let me know if I can assist you in the future.   These are the goals we discussed:  Goals      Patient Stated     Continue current lifestyle         This is a list of the screening recommended for you and due dates:  Health Maintenance  Topic Date Due   Yearly kidney health urinalysis for diabetes  06/24/2019   COVID-19 Vaccine (5 - Pfizer series) 08/09/2020   Eye exam for diabetics  11/26/2021   Yearly kidney function blood test for diabetes  01/08/2022   Hemoglobin A1C  05/07/2022   Complete foot exam   11/07/2022   Tetanus Vaccine  05/25/2031   Pneumonia Vaccine  Completed   Flu Shot  Completed   Hepatitis C Screening: USPSTF Recommendation to screen - Ages 18-79 yo.  Completed   Zoster (Shingles) Vaccine  Completed   HPV Vaccine  Aged Out   Colon Cancer Screening  Discontinued    Advanced directives  Education provided  Conditions/risks identified:     Preventive Care 79 Years and Older, Male  Preventive care refers to lifestyle choices and visits with your health care provider that can promote health and wellness. What does preventive care include? A yearly physical exam. This is also called an annual well check. Dental exams once or twice a year. Routine eye exams. Ask your health care provider how often you should have your eyes checked. Personal lifestyle choices, including: Daily care of your teeth and gums. Regular physical activity. Eating a healthy diet. Avoiding tobacco and drug use. Limiting alcohol use. Practicing safe sex. Taking low doses of aspirin every day. Taking vitamin and mineral supplements as recommended by your health care provider. What happens during an annual well check? The services and screenings done by your health care provider during your  annual well check will depend on your age, overall health, lifestyle risk factors, and family history of disease. Counseling  Your health care provider may ask you questions about your: Alcohol use. Tobacco use. Drug use. Emotional well-being. Home and relationship well-being. Sexual activity. Eating habits. History of falls. Memory and ability to understand (cognition). Work and work Statistician. Screening  You may have the following tests or measurements: Height, weight, and BMI. Blood pressure. Lipid and cholesterol levels. These may be checked every 5 years, or more frequently if you are over 79 years old. Skin check. Lung cancer screening. You may have this screening every year starting at age 60 if you have a 30-pack-year history of smoking and currently smoke or have quit within the past 15 years. Fecal occult blood test (FOBT) of the stool. You may have this test every year starting at age 29. Flexible sigmoidoscopy or colonoscopy. You may have a sigmoidoscopy every 5 years or a colonoscopy every 10 years starting at age 48. Prostate cancer screening. Recommendations will vary depending on your family history and other risks. Hepatitis C blood test. Hepatitis B blood test. Sexually transmitted disease (STD) testing. Diabetes screening. This is done by checking your blood sugar (glucose) after you have not eaten for a while (fasting). You may have this done every 1-3 years. Abdominal aortic aneurysm (AAA) screening. You may need this if you are a current or former smoker. Osteoporosis. You may be screened  starting at age 33 if you are at high risk. Talk with your health care provider about your test results, treatment options, and if necessary, the need for more tests. Vaccines  Your health care provider may recommend certain vaccines, such as: Influenza vaccine. This is recommended every year. Tetanus, diphtheria, and acellular pertussis (Tdap, Td) vaccine. You may need a Td  booster every 10 years. Zoster vaccine. You may need this after age 70. Pneumococcal 13-valent conjugate (PCV13) vaccine. One dose is recommended after age 16. Pneumococcal polysaccharide (PPSV23) vaccine. One dose is recommended after age 56. Talk to your health care provider about which screenings and vaccines you need and how often you need them. This information is not intended to replace advice given to you by your health care provider. Make sure you discuss any questions you have with your health care provider. Document Released: 03/22/2015 Document Revised: 11/13/2015 Document Reviewed: 12/25/2014 Elsevier Interactive Patient Education  2017 [place] Prevention in the Home Falls can cause injuries. They can happen to people of all ages. There are many things you can do to make your home safe and to help prevent falls. What can I do on the outside of my home? Regularly fix the edges of walkways and driveways and fix any cracks. Remove anything that might make you trip as you walk through a door, such as a raised step or threshold. Trim any bushes or trees on the path to your home. Use bright outdoor lighting. Clear any walking paths of anything that might make someone trip, such as rocks or tools. Regularly check to see if handrails are loose or broken. Make sure that both sides of any steps have handrails. Any raised decks and porches should have guardrails on the edges. Have any leaves, snow, or ice cleared regularly. Use sand or salt on walking paths during winter. Clean up any spills in your garage right away. This includes oil or grease spills. What can I do in the bathroom? Use night lights. Install grab bars by the toilet and in the tub and shower. Do not use towel bars as grab bars. Use non-skid mats or decals in the tub or shower. If you need to sit down in the shower, use a plastic, non-slip stool. Keep the floor dry. Clean up any water that spills on the floor  as soon as it happens. Remove soap buildup in the tub or shower regularly. Attach bath mats securely with double-sided non-slip rug tape. Do not have throw rugs and other things on the floor that can make you trip. What can I do in the bedroom? Use night lights. Make sure that you have a light by your bed that is easy to reach. Do not use any sheets or blankets that are too big for your bed. They should not hang down onto the floor. Have a firm chair that has side arms. You can use this for support while you get dressed. Do not have throw rugs and other things on the floor that can make you trip. What can I do in the kitchen? Clean up any spills right away. Avoid walking on wet floors. Keep items that you use a lot in easy-to-reach places. If you need to reach something above you, use a strong step stool that has a grab bar. Keep electrical cords out of the way. Do not use floor polish or wax that makes floors slippery. If you must use wax, use non-skid floor wax. Do not have throw  rugs and other things on the floor that can make you trip. What can I do with my stairs? Do not leave any items on the stairs. Make sure that there are handrails on both sides of the stairs and use them. Fix handrails that are broken or loose. Make sure that handrails are as long as the stairways. Check any carpeting to make sure that it is firmly attached to the stairs. Fix any carpet that is loose or worn. Avoid having throw rugs at the top or bottom of the stairs. If you do have throw rugs, attach them to the floor with carpet tape. Make sure that you have a light switch at the top of the stairs and the bottom of the stairs. If you do not have them, ask someone to add them for you. What else can I do to help prevent falls? Wear shoes that: Do not have high heels. Have rubber bottoms. Are comfortable and fit you well. Are closed at the toe. Do not wear sandals. If you use a stepladder: Make sure that it is  fully opened. Do not climb a closed stepladder. Make sure that both sides of the stepladder are locked into place. Ask someone to hold it for you, if possible. Clearly mark and make sure that you can see: Any grab bars or handrails. First and last steps. Where the edge of each step is. Use tools that help you move around (mobility aids) if they are needed. These include: Canes. Walkers. Scooters. Crutches. Turn on the lights when you go into a dark area. Replace any light bulbs as soon as they burn out. Set up your furniture so you have a clear path. Avoid moving your furniture around. If any of your floors are uneven, fix them. If there are any pets around you, be aware of where they are. Review your medicines with your doctor. Some medicines can make you feel dizzy. This can increase your chance of falling. Ask your doctor what other things that you can do to help prevent falls. This information is not intended to replace advice given to you by your health care provider. Make sure you discuss any questions you have with your health care provider. Document Released: 12/20/2008 Document Revised: 08/01/2015 Document Reviewed: 03/30/2014 Elsevier Interactive Patient Education  2017 [employer].

## 2021-12-22 NOTE — Progress Notes (Signed)
Subjective:   Luis Wise is a 81 y.o. male who presents for Medicare Annual/Subsequent preventive examination.  I connected with  Luis Wise on 12/22/21 by a telephone enabled telemedicine application and verified that I am speaking with the correct person using two identifiers.   I discussed the limitations of evaluation and management by telemedicine. The patient expressed understanding and agreed to proceed.  Patient location: home  Provider location: Tele health-home    Review of Systems     Cardiac Risk Factors include: advanced age (>48men, >18 women);obesity (BMI >30kg/m2);diabetes mellitus;male gender;hypertension     Objective:    There were no vitals filed for this visit. There is no height or weight on file to calculate BMI.     12/22/2021   12:05 PM 06/16/2021    5:31 PM 04/01/2021    9:22 AM 03/18/2020    1:26 PM 03/11/2020    9:58 AM 01/30/2020    6:00 AM 01/26/2020    8:14 AM  Advanced Directives  Does Patient Have a Medical Advance Directive? No Yes Yes Yes Yes No No  Type of Scientist, physiological of [place];Living will      Does patient want to make changes to medical advance directive?    No - Patient declined  No - Patient declined No - Patient declined  Copy of [place] in Chart?   No - copy requested      Would patient like information on creating a medical advance directive? No - Patient declined     No - Patient declined No - Patient declined    Current Medications (verified) Outpatient Encounter Medications as of 12/22/2021  Medication Sig   amLODipine (NORVASC) 10 MG tablet Take 1 tablet (10 mg total) by mouth daily.   atorvastatin (LIPITOR) 80 MG tablet Take 1 tablet (80 mg total) by mouth daily.   carvedilol (COREG) 12.5 MG tablet Take 1 tablet (12.5 mg total) by mouth 2 (two) times daily.   cholecalciferol (VITAMIN D3) 25 MCG (1000 UNIT) tablet Take 1,000 Units by mouth daily.   Coenzyme Q10 (COQ10)  100 MG CAPS Take 100 mg by mouth daily.    Continuous Blood Gluc Transmit (DEXCOM G6 TRANSMITTER) MISC 1 Device by Does not apply route every 3 (three) months. E11.65   FARXIGA 10 MG TABS tablet TAKE 1 TABLET BY MOUTH EVERY DAY BEFORE BREAKFAST   fenofibrate 160 MG tablet TAKE 1 TABLET BY MOUTH EVERY DAY   glimepiride (AMARYL) 4 MG tablet TAKE 1 TABLET BY MOUTH BEFORE BREAKFAST AND SUPPER   glucose blood (ONETOUCH ULTRA) test strip TEST 2X A DAY   GVOKE HYPOPEN 1-PACK 1 MG/0.2ML SOAJ Inject 0.2 mg under skin as needed for low blood sugars   Insulin Disposable Pump (OMNIPOD 5 G6 POD, GEN 5,) MISC Change pod every 2 days   insulin lispro (HUMALOG KWIKPEN) 200 UNIT/ML KwikPen Use as instructed in the insulin pump   Insulin Pen Needle 32G X 4 MM MISC Use 4x a day   lisinopril (ZESTRIL) 20 MG tablet Take 1 tablet (20 mg total) by mouth daily.   lisinopril-hydrochlorothiazide (ZESTORETIC) 20-25 MG tablet Take 1 tablet by mouth daily.   metFORMIN (GLUCOPHAGE) 1000 MG tablet TAKE 1 TABLET BY MOUTH TWICE A DAY   Multiple Vitamins-Minerals (PRESERVISION AREDS 2 PO) Take 1 tablet by mouth 2 (two) times daily.    Omega-3 Fatty Acids (FISH OIL) 1000 MG CAPS Take 1-2 capsules (1,000-2,000 mg total)  by mouth in the morning and at bedtime.   sildenafil (REVATIO) 20 MG tablet TAKE ONE TABLET BY MOUTH THREE TIMES A DAY AS NEEDED   traZODone (DESYREL) 100 MG tablet Take 1 tablet (100 mg total) by mouth at bedtime.   Zinc Oxide 15 MG TBDP 2 tabs per day   No facility-administered encounter medications on file as of 12/22/2021.    Allergies (verified) Ozempic (0.25 or 0.5 mg-dose) [semaglutide(0.25 or 0.5mg -dos)] and Amoxil [amoxicillin]   History: Past Medical History:  Diagnosis Date   Allergy    Amoxicillin   Cataract 2017   Mild progression   Coronary artery disease    a. s/p 5-vessel CABG (LIMA-LAD, SVG-diag, sequential SVG-OM1-OM 2, & SVG-PDA)   Diabetes mellitus with complication (HCC)     Hyperlipidemia    Hypertension    Insomnia    neuropathy    bilateral feet   Postoperative atrial fibrillation [facility])    Past Surgical History:  Procedure Laterality Date   BACK SURGERY     CARDIAC CATHETERIZATION     01/19/2020   COLONOSCOPY WITH PROPOFOL N/A 04/01/2021   Procedure: COLONOSCOPY WITH PROPOFOL;  Surgeon: Lucilla Lame, MD;  Location: [facility] ENDOSCOPY;  Service: Endoscopy;  Laterality: N/A;   CORONARY ARTERY BYPASS GRAFT N/A 01/29/2020   Procedure: CORONARY ARTERY BYPASS GRAFTING (CABG) TIMES FIVE USING LIMA to LAD; ENDOSCOPIC HARVESTED GREATER SAPHENOUS VEIN: SVG to PDA; SVG to sequenced OM1 & OM2.;  Surgeon: Ivin Poot, MD;  Location: [place];  Service: Open Heart Surgery;  Laterality: N/A;   ENDOVEIN HARVEST OF GREATER SAPHENOUS VEIN Bilateral 01/29/2020   Procedure: ENDOVEIN HARVEST OF GREATER SAPHENOUS VEIN;  Surgeon: Ivin Poot, MD;  Location: [place];  Service: Open Heart Surgery;  Laterality: Bilateral;   EYE SURGERY  Radial K   1997   LAMINECTOMY  1993   L5-S1   LEFT HEART CATH AND CORONARY ANGIOGRAPHY N/A 01/19/2020   Procedure: LEFT HEART CATH AND CORONARY ANGIOGRAPHY;  Surgeon: Wellington Hampshire, MD;  Location: [place] CV LAB;  Service: Cardiovascular;  Laterality: N/A;   MENISCUS REPAIR  1996   TEE WITHOUT CARDIOVERSION N/A 01/29/2020   Procedure: TRANSESOPHAGEAL ECHOCARDIOGRAM (TEE);  Surgeon: Prescott Gum, Collier Salina, MD;  Location: [place];  Service: Open Heart Surgery;  Laterality: N/A;   TONSILLECTOMY     Family History  Problem Relation Age of Onset   Hypertension Mother    Diabetes Father    Cancer Neg Hx    COPD Neg Hx    Heart disease Neg Hx    Hyperlipidemia Neg Hx    Stroke Neg Hx    Colon cancer Neg Hx    Prostate cancer Neg Hx    Social History   Socioeconomic History   Marital status: Married    Spouse name: Not on file   Number of children: Not on file   Years of education: Not on file   Highest education level: Not on file   Occupational History   Occupation: CFO  Tobacco Use   Smoking status: Never   Smokeless tobacco: Never  Vaping Use   Vaping Use: Never used  Substance and Sexual Activity   Alcohol use: Not Currently    Alcohol/week: 0.0 standard drinks of alcohol    Comment: Very rare- occasionaly beer or wine    Drug use: No   Sexual activity: Yes    Birth control/protection: None  Other Topics Concern   Not on file  Social History Narrative  Married 1970.   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   Curator, land and sea.     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   Social Determinants of Health   Financial Resource Strain: Low Risk  (12/22/2021)   Overall Financial Resource Strain (CARDIA)    Difficulty of Paying Living Expenses: Not hard at all  Food Insecurity: No Food Insecurity (12/22/2021)   Hunger Vital Sign    Worried About Running Out of Food in the Last Year: Never true    Ran Out of Food in the Last Year: Never true  Transportation Needs: No Transportation Needs (12/22/2021)   PRAPARE - Hydrologist (Medical): No    Lack of Transportation (Non-Medical): No  Physical Activity: Insufficiently Active (11/30/2020)   Exercise Vital Sign    Days of Exercise per Week: 2 days    Minutes of Exercise per Session: 60 min  Stress: No Stress Concern Present (12/22/2021)   [place]    Feeling of Stress : Not at all  Social Connections: [place] (12/22/2021)   Social Connection and Isolation Panel [NHANES]    Frequency of Communication with Friends and Family: More than three times a week    Frequency of Social Gatherings with Friends and Family: Not on file    Attends Religious Services: More than 4 times per year    Active Member of [employer] or Organizations: Yes    Attends Arts administrator: More than 4 times per year    Marital Status: Married    Tobacco Counseling Counseling given: Not Answered   Clinical Intake:  Pre-visit preparation completed: Yes  Pain : No/denies pain     Diabetes: Yes CBG done?: No Did pt. bring in CBG monitor from home?: No  How often do you need to have someone help you when you read instructions, pamphlets, or other written materials from your doctor or pharmacy?: 1 - Never  Diabetic?  Yes  Nutrition Risk Assessment:  Has the patient had any N/V/D within the last 2 months?  No  Does the patient have any non-healing wounds?  No  Has the patient had any unintentional weight loss or weight gain?  No   Diabetes:  Is the patient diabetic?  Yes  If diabetic, was a CBG obtained today?  No  Did the patient bring in their glucometer from home?  No  How often do you monitor your CBG's?  Continuous blood glucose monitor .   Financial Strains and Diabetes Management:  Are you having any financial strains with the device, your supplies or your medication? No .  Does the patient want to be seen by Chronic Care Management for management of their diabetes?  No  Would the patient like to be referred to a Nutritionist or for Diabetic Management?  No   Diabetic Exams:  Diabetic Eye Exam: . Pt has been advised about the importance in completing this exam.   Diabetic Foot Exam:  Pt has been advised about the importance in completing this exam. .    Interpreter Needed?: No  Information entered by :: Leroy Kennedy LPN   Activities of Daily Living    12/22/2021   12:56 PM  In your present state of health, do you have any difficulty performing the following activities:  Hearing? 0  Vision? 0  Difficulty concentrating or making decisions? 0  Walking  or climbing stairs? 0  Dressing or bathing? 0  Doing errands, shopping? 0  Preparing Food and eating ? N  Using the Toilet? N  In the past six months, have you accidently leaked  urine? N  Do you have problems with loss of bowel control? N  Managing your Medications? N  Managing your Finances? N  Housekeeping or managing your Housekeeping? N    Patient Care Team: Tonia Ghent, MD as PCP - General (Family Medicine) Minna Merritts, MD as PCP - Cardiology (Cardiology) Bryson Ha, OD (Optometry)  Indicate any recent Medical Services you may have received from other than [facility] providers in the past year (date may be approximate).     Assessment:   This is a routine wellness examination for [place].  Hearing/Vision screen Hearing Screening - Comments:: No trouble hearing Vision Screening - Comments:: Up to date  Dietary issues and exercise activities discussed: Current Exercise Habits: Home exercise routine, Type of exercise: walking, Time (Minutes): 60, Frequency (Times/Week): 4, Weekly Exercise (Minutes/Week): 240, Intensity: Moderate   Goals Addressed             This Visit's Progress    Patient Stated       Continue current lifestyle        Depression Screen    12/22/2021   12:11 PM 06/16/2021    5:31 PM 11/30/2020    2:19 PM 11/30/2020    2:14 PM 03/19/2020   10:30 AM 03/11/2020    9:57 AM 01/11/2020    8:41 AM  PHQ 2/9 Scores  PHQ - 2 Score 0 0 0 0 0 0 0  PHQ- 9 Score 1    0      Fall Risk    12/22/2021   12:05 PM 06/16/2021    5:31 PM 11/30/2020    2:19 PM 03/18/2020    1:25 PM 03/11/2020    9:57 AM  Fall Risk   Falls in the past year? 0 0 0 0 0  Number falls in past yr: 0   0   Injury with Fall? 0  0 0   Risk for fall due to :    No Fall Risks   Follow up Falls evaluation completed;Education provided;Falls prevention discussed   Falls evaluation completed;Education provided;Falls prevention discussed     FALL RISK PREVENTION PERTAINING TO THE HOME:  Any stairs in or around the home? Yes  If so, are there any without handrails? No  Home free of loose throw rugs in walkways, pet beds, electrical cords, etc? Yes  Adequate  lighting in your home to reduce risk of falls? Yes   ASSISTIVE DEVICES UTILIZED TO PREVENT FALLS:  Life alert? No  Use of a cane, walker or w/c? No  Grab bars in the bathroom? Yes  Shower chair or bench in shower? No  Elevated toilet seat or a handicapped toilet? No   TIMED UP AND GO:  Was the test performed? No .    Cognitive Function:        12/22/2021   12:06 PM 11/30/2020    2:21 PM 06/13/2018    8:59 AM 06/09/2017   11:18 AM  6CIT Screen  What Year? 0 points 0 points 0 points 0 points  What month? 0 points 0 points 0 points 0 points  What time?  0 points 0 points 0 points  Count back from 20  0 points 0 points 0 points  Months in reverse  0 points 0  points 0 points  Repeat phrase   0 points 2 points  Total Score   0 points 2 points    Immunizations Immunization History  Administered Date(s) Administered   Fluad Quad(high Dose 65+) 12/04/2019, 12/09/2021   Influenza Inj Mdck Quad Pf 12/16/2017   Influenza, High Dose Seasonal PF 12/25/2016, 12/15/2018, 01/02/2021   Influenza-Unspecified 12/17/2015, 12/16/2017   PFIZER Comirnaty(Gray Top)Covid-19 Tri-Sucrose Vaccine 06/14/2020   PFIZER(Purple Top)SARS-COV-2 Vaccination 03/15/2019, 04/08/2019, 12/04/2019   Pneumococcal Conjugate-13 03/09/2013   Pneumococcal Polysaccharide-23 03/09/2012, 03/09/2014   Tdap 05/24/2021   Tetanus 03/09/2014   Zoster Recombinat (Shingrix) 01/11/2018, 03/14/2018    TDAP status: Up to date  Flu Vaccine status: Up to date  Pneumococcal vaccine status: Up to date  Covid-19 vaccine status: Information provided on how to obtain vaccines.   Qualifies for Shingles Vaccine? No   Zostavax completed No   Shingrix Completed?: Yes  Screening Tests Health Maintenance  Topic Date Due   Diabetic kidney evaluation - Urine ACR  06/24/2019   COVID-19 Vaccine (5 - Pfizer series) 08/09/2020   OPHTHALMOLOGY EXAM  11/26/2021   Diabetic kidney evaluation - GFR measurement  01/08/2022   HEMOGLOBIN  A1C  05/07/2022   FOOT EXAM  11/07/2022   TETANUS/TDAP  05/25/2031   Pneumonia Vaccine 57+ Years old  Completed   INFLUENZA VACCINE  Completed   Hepatitis C Screening  Completed   Zoster Vaccines- Shingrix  Completed   HPV VACCINES  Aged Out   COLONOSCOPY (Pts 45-8yrs Insurance coverage will need to be confirmed)  Discontinued    Health Maintenance  Health Maintenance Due  Topic Date Due   Diabetic kidney evaluation - Urine ACR  06/24/2019   COVID-19 Vaccine (5 - Pfizer series) 08/09/2020   OPHTHALMOLOGY EXAM  11/26/2021   Diabetic kidney evaluation - GFR measurement  01/08/2022    Colorectal cancer screening: No longer required.   Lung Cancer Screening: (Low Dose CT Chest recommended if Age 43-80 years, 30 pack-year currently smoking OR have quit w/in 15years.)  qualify.   Lung Cancer Screening Referral:   Additional Screening:  Hepatitis C Screening: does not qualify; Completed 2021  Vision Screening: Recommended annual ophthalmology exams for early detection of glaucoma and other disorders of the eye. Is the patient up to date with their annual eye exam?  No  Who is the provider or what is the name of the office in which the patient attends annual eye exams?  If pt is not established with a provider, would they like to be referred to a provider to establish care? No .   Dental Screening: Recommended annual dental exams for proper oral hygiene  Community Resource Referral / Chronic Care Management: CRR required this visit?  No   CCM required this visit?  No      Plan:     I have personally reviewed and noted the following in the patient's chart:   Medical and social history Use of alcohol, tobacco or illicit drugs  Current medications and supplements including opioid prescriptions. Patient is not currently taking opioid prescriptions. Functional ability and status Nutritional status Physical activity Advanced directives List of other  physicians Hospitalizations, surgeries, and ER visits in previous 12 months Vitals Screenings to include cognitive, depression, and falls Referrals and appointments  In addition, I have reviewed and discussed with patient certain preventive protocols, quality metrics, and best practice recommendations. A written personalized care plan for preventive services as well as general preventive health recommendations were provided to patient.  Leroy Kennedy, LPN   [id number]/88/[id number]   Nurse Notes:

## 2021-12-22 NOTE — Progress Notes (Unsigned)
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 Left ventricular ejection fraction, by estimation, is 45 to 50%. The  left ventricle has mildly decreased function. The left ventricle  demonstrates regional wall motion abnormalities (anteroseptal wall  hypokinesis, possibly secondary to post-operative   state). There is mild left ventricular hypertrophy. Left ventricular  diastolic parameters are consistent with Grade I diastolic dysfunction  (impaired relaxation).   2. Right ventricular systolic function is normal. The right ventricular  size is normal.   3. The mitral valve is normal in structure. Mild mitral valve  regurgitation. No evidence of mitral stenosis.   4. The aortic valve is normal in structure. There is mild calcification  of the aortic valve. Aortic valve regurgitation is mild. Aortic valve  sclerosis is present, with no evidence of aortic valve stenosis.   5. The inferior vena cava is dilated in size with >50%  respiratory  variability, suggesting right atrial pressure of 8 mmHg.   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 converted to normal sinus rhythm   Cardiac rehab,  Walking 3 miles a day Denies any anginal symptoms on exertion No side effects from medications  Lab work reviewed A1C: 6.9 Total chol 122, LDL 66  Other past medical history reviewed Licensed pilot,  PMH:   has a past medical history of Allergy, Cataract (2017), Coronary artery disease, Diabetes mellitus with complication (Luis Wise), Hyperlipidemia, Hypertension, Insomnia, neuropathy, and Postoperative atrial fibrillation (Luis Wise).  PSH:    Past Surgical History:  Procedure Laterality Date   BACK SURGERY     CARDIAC CATHETERIZATION     01/19/2020   COLONOSCOPY WITH PROPOFOL N/A 04/01/2021   Procedure: COLONOSCOPY WITH PROPOFOL;  Surgeon: Midge Minium, MD;  Location: [facility] ENDOSCOPY;  Service: Endoscopy;  Laterality: N/A;   CORONARY ARTERY BYPASS GRAFT N/A 01/29/2020   Procedure: CORONARY ARTERY BYPASS GRAFTING (CABG) TIMES FIVE USING LIMA to LAD; ENDOSCOPIC HARVESTED GREATER SAPHENOUS VEIN: SVG to PDA; SVG to sequenced OM1 & OM2.;  Surgeon: Kerin Perna, MD;  Location: [facility] OR;  Service: Open  Heart Surgery;  Laterality: N/A;   ENDOVEIN HARVEST OF GREATER SAPHENOUS VEIN Bilateral 01/29/2020   Procedure: ENDOVEIN HARVEST OF GREATER SAPHENOUS VEIN;  Surgeon: Kerin Perna, MD;  Location: [facility] OR;  Service: Open Heart Surgery;  Laterality:  Bilateral;   EYE SURGERY  Radial K   1997   LAMINECTOMY  1993   L5-S1   LEFT HEART CATH AND CORONARY ANGIOGRAPHY N/A 01/19/2020   Procedure: LEFT HEART CATH AND CORONARY ANGIOGRAPHY;  Surgeon: Iran Ouch, MD;  Location: [facility] INVASIVE CV LAB;  Service: Cardiovascular;  Laterality: N/A;   MENISCUS REPAIR  1996   TEE WITHOUT CARDIOVERSION N/A 01/29/2020   Procedure: TRANSESOPHAGEAL ECHOCARDIOGRAM (TEE);  Surgeon: Donata Clay, Theron Arista, MD;  Location: [facility] OR;  Service: Open Heart Surgery;  Laterality: N/A;   TONSILLECTOMY      Current Outpatient Medications  Medication Sig Dispense Refill   amLODipine (NORVASC) 10 MG tablet Take 1 tablet (10 mg total) by mouth daily. 90 tablet 3   atorvastatin (LIPITOR) 80 MG tablet Take 1 tablet (80 mg total) by mouth daily. 90 tablet 3   carvedilol (COREG) 12.5 MG tablet Take 1 tablet (12.5 mg total) by mouth 2 (two) times daily. 180 tablet 3   cholecalciferol (VITAMIN D3) 25 MCG (1000 UNIT) tablet Take 1,000 Units by mouth daily.     Coenzyme Q10 (COQ10) 100 MG CAPS Take 100 mg by mouth daily.      Continuous Blood Gluc Transmit (DEXCOM G6 TRANSMITTER) MISC 1 Device by Does not apply route every 3 (three) months. E11.65 1 each 3   FARXIGA 10 MG TABS tablet TAKE 1 TABLET BY MOUTH EVERY DAY BEFORE BREAKFAST 30 tablet 11   fenofibrate 160 MG tablet TAKE 1 TABLET BY MOUTH EVERY DAY 90 tablet 1   glimepiride (AMARYL) 4 MG tablet TAKE 1 TABLET BY MOUTH BEFORE BREAKFAST AND SUPPER 180 tablet 3   glucose blood (ONETOUCH ULTRA) test strip TEST 2X A DAY 200 strip 3   GVOKE HYPOPEN 1-PACK 1 MG/0.2ML SOAJ Inject 0.2 mg under skin as needed for low blood sugars 0.2 mL prn   Insulin Disposable Pump (OMNIPOD 5 G6 POD, GEN 5,) MISC Change pod every 2 days 30 each 3   insulin lispro (HUMALOG KWIKPEN) 200 UNIT/ML KwikPen Use as instructed in the insulin pump 66 mL 3   Insulin Pen Needle 32G X 4 MM MISC Use 4x a day 300 each 3   lisinopril (ZESTRIL) 20 MG tablet Take 1  tablet (20 mg total) by mouth daily. 90 tablet 3   lisinopril-hydrochlorothiazide (ZESTORETIC) 20-25 MG tablet Take 1 tablet by mouth daily. 90 tablet 3   metFORMIN (GLUCOPHAGE) 1000 MG tablet TAKE 1 TABLET BY MOUTH TWICE A DAY 180 tablet 3   Multiple Vitamins-Minerals (PRESERVISION AREDS 2 PO) Take 1 tablet by mouth 2 (two) times daily.      Omega-3 Fatty Acids (FISH OIL) 1000 MG CAPS Take 1-2 capsules (1,000-2,000 mg total) by mouth in the morning and at bedtime.  0   sildenafil (REVATIO) 20 MG tablet TAKE ONE TABLET BY MOUTH THREE TIMES A DAY AS NEEDED 90 tablet 0   traZODone (DESYREL) 100 MG tablet Take 1 tablet (100 mg total) by mouth at bedtime. 90 tablet 3   Zinc Oxide 15 MG TBDP 2 tabs per day     No current facility-administered medications for this visit.     Allergies:   Ozempic (0.25 or 0.5 mg-dose) [semaglutide(0.25 or 0.5mg -dos)] and Amoxil [amoxicillin]  Social History:  The patient  reports that he has never smoked. He has never used smokeless tobacco. He reports that he does not currently use alcohol. He reports that he does not use drugs.   Family History:   family history includes Diabetes in his father; Hypertension in his mother.    Review of Systems: Review of Systems  Constitutional: Negative.   HENT: Negative.    Respiratory: Negative.    Cardiovascular: Negative.   Gastrointestinal: Negative.   Musculoskeletal: Negative.   Neurological: Negative.        Left arm pain and numbness in bed when laying on left side  Psychiatric/Behavioral: Negative.    All other systems reviewed and are negative.  PHYSICAL EXAM: VS:  BP 134/70 (BP Location: Left Arm, Patient Position: Sitting, Cuff Size: Large)   Pulse 64   Ht 5' 9.5" (1.765 m)   Wt 179 lb (81.2 kg)   SpO2 98%   BMI 26.05 kg/m  , BMI Body mass index is 26.05 kg/m. Constitutional:  oriented to person, place, and time. No distress.  HENT:  Head: Grossly normal Eyes:  no discharge. No scleral icterus.   Neck: No JVD, no carotid bruits  Cardiovascular: Regular rate and rhythm, no murmurs appreciated Pulmonary/Chest: Clear to auscultation bilaterally, no wheezes or rails Abdominal: Soft.  no distension.  no tenderness.  Musculoskeletal: Normal range of motion Neurological:  normal muscle tone. Coordination normal. No atrophy Skin: Skin warm and dry Psychiatric: normal affect, pleasant   Recent Labs: 01/08/2021: ALT 40; BUN 27; Creatinine, Ser 1.30; Hemoglobin 15.0; Platelets 291.0; Potassium 4.0; Sodium 137; TSH 1.45    Lipid Panel Lab Results  Component Value Date   CHOL 112 01/08/2021   HDL 20.20 (L) 01/08/2021   LDLCALC 66 04/11/2020   TRIG 356.0 (H) 01/08/2021    Wt Readings from Last 3 Encounters:  12/23/21 179 lb (81.2 kg)  11/25/21 181 lb (82.1 kg)  11/06/21 176 lb 9.6 oz (80.1 kg)     ASSESSMENT AND PLAN:  Problem List Items Addressed This Visit       Cardiology Problems   Atrial fibrillation (Luis Wise)     Other   S/P CABG x 5   Other Visit Diagnoses     Coronary artery disease of native artery of native heart with stable angina pectoris (Luis Wise)    -  Primary   Essential hypertension       Hyperlipidemia, mixed       Type 2 diabetes mellitus without complication, without long-term current use of insulin (Luis Wise)       Dilated cardiomyopathy (Luis Wise)         CAD with stable angina CABG x5, November 2021 Continue aspirin, statin, beta-blocker,  Given mildly depressed ejection fraction, recommended he hold the lisinopril, wait 3 days then start Entresto 49/51 mg twice daily Suggest that he closely monitor blood pressure and call us if blood pressure runs high, higher dose Entresto could be used Additional medication options include adding spironolactone  Hyperlipidemia Tolerating Lipitor 80 daily Cholesterol at goal  Essential hypertension Medication changes as above  Diabetes type 2 with complications Followed by endocrinology A1c around 6.5, numbers  improving  Left arm numbness, pain Unable to exclude cervical spine disease, likely nerve entrapment when laying on his left side is causing left arm pain, numbness tingling in his hand    Total encounter time more than 30 minutes  Greater than 50% was spent in counseling and coordination of care with the  patient   Signed, Esmond Plants, M.D., Ph.D. [place], [place]

## 2021-12-23 ENCOUNTER — Encounter: Payer: Self-pay | Admitting: Cardiovascular Disease

## 2021-12-23 ENCOUNTER — Ambulatory Visit: Payer: Medicare Other | Attending: Cardiovascular Disease | Admitting: Cardiovascular Disease

## 2021-12-23 VITALS — BP 134/70 | HR 64 | Ht 69.5 in | Wt 179.0 lb

## 2021-12-23 DIAGNOSIS — E782 Mixed hyperlipidemia: Secondary | ICD-10-CM | POA: Diagnosis not present

## 2021-12-23 DIAGNOSIS — E119 Type 2 diabetes mellitus without complications: Secondary | ICD-10-CM | POA: Diagnosis not present

## 2021-12-23 DIAGNOSIS — I4819 Other persistent atrial fibrillation: Secondary | ICD-10-CM | POA: Diagnosis not present

## 2021-12-23 DIAGNOSIS — Z951 Presence of aortocoronary bypass graft: Secondary | ICD-10-CM | POA: Insufficient documentation

## 2021-12-23 DIAGNOSIS — I10 Essential (primary) hypertension: Secondary | ICD-10-CM | POA: Insufficient documentation

## 2021-12-23 DIAGNOSIS — I420 Dilated cardiomyopathy: Secondary | ICD-10-CM | POA: Insufficient documentation

## 2021-12-23 DIAGNOSIS — I25118 Atherosclerotic heart disease of native coronary artery with other forms of angina pectoris: Secondary | ICD-10-CM | POA: Diagnosis not present

## 2021-12-23 MED ORDER — ENTRESTO 49-51 MG PO TABS: 1.0000 | ORAL_TABLET | Freq: Two times a day (BID) | ORAL | 5 refills | Status: DC

## 2021-12-23 NOTE — Patient Instructions (Addendum)
Weston Brass: chiropractor for neck [phone]   Medication Instructions:  Please STOP lisinopril and lisinopril HCT  Wait 3 days Start Entresto 49/51 mg twice a day  Monitor pressure If pressure runs high, call the office We can increase the entresto   If you need a refill on your cardiac medications before your next appointment, please call your pharmacy.   Lab work: No new labs needed  Testing/Procedures: No new testing needed  Follow-Up: At [facility], you and your health needs are our priority.  As part of our continuing mission to provide you with exceptional heart care, we have created designated Provider Care Teams.  These Care Teams include your primary Cardiologist (physician) and Advanced Practice Providers (APPs -  Physician Assistants and Nurse Practitioners) who all work together to provide you with the care you need, when you need it.  You will need a follow up appointment in 6 months  Providers on your designated Care Team:   Murray Hodgkins, NP Christell Faith, PA-C Cadence Kathlen Mody, [place]  COVID-19 Vaccine Information can be found at: [email or web address] For questions related to vaccine distribution or appointments, please email [email or web address] or call [phone].

## 2021-12-26 ENCOUNTER — Other Ambulatory Visit: Payer: Self-pay | Admitting: Family Medicine

## 2022-01-06 ENCOUNTER — Other Ambulatory Visit: Payer: Self-pay | Admitting: Family Medicine

## 2022-01-06 DIAGNOSIS — E118 Type 2 diabetes mellitus with unspecified complications: Secondary | ICD-10-CM

## 2022-01-08 ENCOUNTER — Other Ambulatory Visit (INDEPENDENT_AMBULATORY_CARE_PROVIDER_SITE_OTHER): Payer: Medicare Other

## 2022-01-08 DIAGNOSIS — E118 Type 2 diabetes mellitus with unspecified complications: Secondary | ICD-10-CM

## 2022-01-08 LAB — HEMOGLOBIN A1C: Hgb A1c MFr Bld: 6.4 % (ref 4.6–6.5)

## 2022-01-08 LAB — LIPID PANEL
Cholesterol: 83 mg/dL (ref 0–200)
HDL: 23 mg/dL — ABNORMAL LOW (ref >39.00)
LDL Cholesterol: 34 mg/dL (ref 0–99)
NonHDL: 59.84
Total CHOL/HDL Ratio: 4
Triglycerides: 127 mg/dL (ref 0.0–149.0)
VLDL: 25.4 mg/dL (ref 0.0–40.0)

## 2022-01-08 LAB — COMPREHENSIVE METABOLIC PANEL
ALT: 28 U/L (ref 0–53)
AST: 23 U/L (ref 0–37)
Albumin: 4.8 g/dL (ref 3.5–5.2)
Alkaline Phosphatase: 40 U/L (ref 39–117)
BUN: 18 mg/dL (ref 6–23)
CO2: 28 mEq/L (ref 19–32)
Calcium: 9.6 mg/dL (ref 8.4–10.5)
Chloride: 104 mEq/L (ref 96–112)
Creatinine, Ser: 1.13 mg/dL (ref 0.40–1.50)
GFR: 62.08 mL/min (ref >60.00)
Glucose, Bld: 108 mg/dL — ABNORMAL HIGH (ref 70–99)
Potassium: 4 mEq/L (ref 3.5–5.1)
Sodium: 140 mEq/L (ref 135–145)
Total Bilirubin: 0.5 mg/dL (ref 0.2–1.2)
Total Protein: 7.1 g/dL (ref 6.0–8.3)

## 2022-01-08 LAB — CBC WITH DIFFERENTIAL/PLATELET
Basophils Absolute: 0 10*3/uL (ref 0.0–0.1)
Basophils Relative: 1.2 % (ref 0.0–3.0)
Eosinophils Absolute: 0.1 10*3/uL (ref 0.0–0.7)
Eosinophils Relative: 2.8 % (ref 0.0–5.0)
HCT: 46 % (ref 39.0–52.0)
Hemoglobin: 15.3 g/dL (ref 13.0–17.0)
Lymphocytes Relative: 19.9 % (ref 12.0–46.0)
Lymphs Abs: 0.8 10*3/uL (ref 0.7–4.0)
MCHC: 33.3 g/dL (ref 30.0–36.0)
MCV: 86.6 fl (ref 78.0–100.0)
Monocytes Absolute: 0.6 10*3/uL (ref 0.1–1.0)
Monocytes Relative: 15.5 % — ABNORMAL HIGH (ref 3.0–12.0)
Neutro Abs: 2.4 10*3/uL (ref 1.4–7.7)
Neutrophils Relative %: 60.6 % (ref 43.0–77.0)
Platelets: 235 10*3/uL (ref 150.0–400.0)
RBC: 5.31 Mil/uL (ref 4.22–5.81)
RDW: 15.5 % (ref 11.5–15.5)
WBC: 4 10*3/uL (ref 4.0–10.5)

## 2022-01-08 LAB — TSH: TSH: 2.19 u[IU]/mL (ref 0.35–5.50)

## 2022-01-15 ENCOUNTER — Encounter: Payer: Self-pay | Admitting: Family Medicine

## 2022-01-15 ENCOUNTER — Ambulatory Visit (INDEPENDENT_AMBULATORY_CARE_PROVIDER_SITE_OTHER): Payer: Medicare Other | Admitting: Family Medicine

## 2022-01-15 VITALS — BP 124/78 | HR 65 | Temp 97.4°F | Ht 69.5 in | Wt 176.0 lb

## 2022-01-15 DIAGNOSIS — Z0000 Encounter for general adult medical examination without abnormal findings: Secondary | ICD-10-CM

## 2022-01-15 DIAGNOSIS — I152 Hypertension secondary to endocrine disorders: Secondary | ICD-10-CM

## 2022-01-15 DIAGNOSIS — E785 Hyperlipidemia, unspecified: Secondary | ICD-10-CM | POA: Diagnosis not present

## 2022-01-15 DIAGNOSIS — Z7189 Other specified counseling: Secondary | ICD-10-CM

## 2022-01-15 DIAGNOSIS — E118 Type 2 diabetes mellitus with unspecified complications: Secondary | ICD-10-CM | POA: Diagnosis not present

## 2022-01-15 DIAGNOSIS — E1159 Type 2 diabetes mellitus with other circulatory complications: Secondary | ICD-10-CM | POA: Diagnosis not present

## 2022-01-15 DIAGNOSIS — I25118 Atherosclerotic heart disease of native coronary artery with other forms of angina pectoris: Secondary | ICD-10-CM | POA: Diagnosis not present

## 2022-01-15 NOTE — Patient Instructions (Signed)
Don't change your meds for now.  I'll update Dr. Elvera Lennox.  Take care.  Glad to see you.

## 2022-01-15 NOTE — Progress Notes (Signed)
Diabetes:  Using medications without difficulties: yes Hypoglycemic episodes: very rare, he has awareness and is able to correct, cautions d/w pt.   Hyperglycemic episodes: no  Feet problems: some change in sensation on the plantar side of feet but no bothersome.   Blood Sugars averaging: usually in range or close on his home meter.   eye exam within last year: due, d/w pt.    Hypertension:    Using medication without problems or lightheadedness: yes Chest pain with exertion:no Edema:no Short of breath:no Walking 2.8 miles w/o CP or SOB.    Elevated Cholesterol: Using medications without problems: yes Muscle aches: no Diet compliance: yes Exercise: yes  Tetanus 2016 Flu shot 2023 covid vaccine prev done.  PNA up to date.  Shingles 2020.  RSV vaccine d/w pt.   Advance directive discussed with patient.  Wife designated if patient were incapacitated. Colonoscopy 2023 Prostate cancer screening and PSA options (with potential risks and benefits of testing vs not testing) were discussed along with recent recs/guidelines.  He declined testing PSA at this point.  PMH and SH reviewed.   Vital signs, Meds and allergies reviewed.  ROS: Per HPI unless specifically indicated in ROS section   GEN: nad, alert and oriented HEENT: ncat NECK: supple w/o LA CV: rrr PULM: ctab, no inc wob ABD: soft, +bs EXT: no edema SKIN: no acute rash  Diabetic foot exam: Normal inspection No skin breakdown No calluses  Normal DP pulses Normal sensation to light tough and monofilament Nails normal

## 2022-01-18 NOTE — Assessment & Plan Note (Signed)
Advance directive discussed with patient.  Wife designated if patient were incapacitated. 

## 2022-01-18 NOTE — Assessment & Plan Note (Signed)
Continue work on diet and exercise.  Labs discussed with patient.  Continue Lipitor.

## 2022-01-18 NOTE — Assessment & Plan Note (Signed)
Tetanus 2016 Flu shot 2023 covid vaccine prev done.  PNA up to date.  Shingles 2020.  RSV vaccine d/w pt.   Advance directive discussed with patient.  Wife designated if patient were incapacitated. Colonoscopy 2023 Prostate cancer screening and PSA options (with potential risks and benefits of testing vs not testing) were discussed along with recent recs/guidelines.  He declined testing PSA at this point.

## 2022-01-18 NOTE — Assessment & Plan Note (Signed)
Routine cautions given to patient.  Continue Farxiga glimepiride insulin metformin and follow-up with endocrinology as scheduled.  Note routed to [facility] regarding labs.  Labs discussed with patient.

## 2022-01-18 NOTE — Assessment & Plan Note (Signed)
Doing well.  Able to walk 2.8 miles without chest pain or shortness of breath.  Continue Entresto Farxiga carvedilol amlodipine.

## 2022-01-21 ENCOUNTER — Other Ambulatory Visit: Payer: Self-pay | Admitting: Family Medicine

## 2022-01-30 ENCOUNTER — Other Ambulatory Visit: Payer: Self-pay | Admitting: Family Medicine

## 2022-02-02 ENCOUNTER — Telehealth: Payer: Self-pay | Admitting: Nutrition

## 2022-02-02 ENCOUNTER — Other Ambulatory Visit: Payer: Self-pay

## 2022-02-02 DIAGNOSIS — Z794 Long term (current) use of insulin: Secondary | ICD-10-CM

## 2022-02-02 MED ORDER — HUMALOG KWIKPEN 200 UNIT/ML ~~LOC~~ SOPN: PEN_INJECTOR | SUBCUTANEOUS | 3 refills | Status: DC

## 2022-02-02 NOTE — Telephone Encounter (Signed)
Rx sent to preferred pharmacy with update.

## 2022-02-02 NOTE — Telephone Encounter (Signed)
Patient's wife called saying that " a pod fell off, and they lost 200u of insulin, and now they are short of insulin this month.  Can you please order at least 250u more every month, so they do not run short again.  They needed to purchase a bottle and it was expensive.  Thank you She is due to pick up her prescriptions tomorrow, so if you can do it today that would be great"

## 2022-02-03 NOTE — Telephone Encounter (Signed)
Patient  notified that medication update was called in

## 2022-02-05 ENCOUNTER — Other Ambulatory Visit: Payer: Self-pay | Admitting: Cardiovascular Disease

## 2022-02-11 ENCOUNTER — Other Ambulatory Visit: Payer: Self-pay | Admitting: Cardiovascular Disease

## 2022-02-11 ENCOUNTER — Encounter: Payer: Self-pay | Admitting: Cardiovascular Disease

## 2022-02-13 ENCOUNTER — Other Ambulatory Visit: Payer: Self-pay | Admitting: *Deleted

## 2022-02-17 ENCOUNTER — Telehealth: Payer: Self-pay | Admitting: Physician Assistant

## 2022-02-17 DIAGNOSIS — N529 Male erectile dysfunction, unspecified: Secondary | ICD-10-CM

## 2022-02-17 MED ORDER — SILDENAFIL CITRATE 50 MG PO TABS: 100.0000 mg | ORAL_TABLET | ORAL | 6 refills | Status: DC | PRN

## 2022-02-17 NOTE — Telephone Encounter (Signed)
I have previously evaluated the patient in 2021.  While his wife was in the office being evaluated today, she mentioned they have requested a refill of the patient's sildenafil over a week ago and have not heard back.  Upon chart review, it appears the patient has been on sildenafil and tolerating it without issues.  There is no contraindication to medication refill when reviewing his medications.  The patient is not on long-acting nitrate, nor does he have a short acting nitrate therapy.  I will refill the patient's sildenafil.  Precautions were discussed with the patient's wife.

## 2022-02-18 ENCOUNTER — Other Ambulatory Visit: Payer: Self-pay | Admitting: Cardiovascular Disease

## 2022-02-18 DIAGNOSIS — N529 Male erectile dysfunction, unspecified: Secondary | ICD-10-CM

## 2022-02-18 MED ORDER — SILDENAFIL CITRATE 100 MG PO TABS: 100.0000 mg | ORAL_TABLET | ORAL | 6 refills | Status: DC | PRN

## 2022-03-11 ENCOUNTER — Encounter: Payer: Self-pay | Admitting: Internal Medicine

## 2022-03-11 ENCOUNTER — Ambulatory Visit (INDEPENDENT_AMBULATORY_CARE_PROVIDER_SITE_OTHER): Payer: Medicare Other | Admitting: Internal Medicine

## 2022-03-11 VITALS — BP 140/82 | HR 66 | Ht 69.5 in | Wt 183.6 lb

## 2022-03-11 DIAGNOSIS — Z794 Long term (current) use of insulin: Secondary | ICD-10-CM | POA: Diagnosis not present

## 2022-03-11 DIAGNOSIS — E785 Hyperlipidemia, unspecified: Secondary | ICD-10-CM | POA: Diagnosis not present

## 2022-03-11 DIAGNOSIS — E1159 Type 2 diabetes mellitus with other circulatory complications: Secondary | ICD-10-CM | POA: Diagnosis not present

## 2022-03-11 LAB — POCT GLYCOSYLATED HEMOGLOBIN (HGB A1C): Hemoglobin A1C: 5.9 % — AB (ref 4.0–5.6)

## 2022-03-11 NOTE — Patient Instructions (Addendum)
Please continue: - Farxiga 10 mg before b'fast - Metformin 1500 mg with dinner  STOP all Glimepiride.  Use the following pump settings: - Basal rates: 12 am: 2 >> 1.7 units/h 6 am: 2  - Insulin to carb ratio: 12 am: 1:8 - Target: 12 am: 120-150 - Correction factor (insulin sensitivity factor):  12 am: 70 - Active insulin time: 4h - Changes infusion site: q3 days  Please return in 3-4 months.

## 2022-03-11 NOTE — Progress Notes (Signed)
Patient ID: Luis Wise, male   DOB: 1940-03-26, 82 y.o.   MRN: [id number]   HPI: Luis Wise is a 82 y.o.-year-old male, initially referred by his PCP, Dr. Damita Dunnings, returning for follow-up: DM2, dx in 2004-2005, insulin-dependent, uncontrolled, with complications (CAD - s/p CABG x5, CKD).  He is here with his wife, who is a former Press photographer and offers part of the history especially related to medications, blood sugars, and diet.  Last visit 3 months ago.  Interim history: No increased urination, blurry vision, nausea, chest pain. He stopped evening Glimepiride early November as sugars were dropping too low at night. Apparently, he was still on Glimepiride 2x a day at that time as this remained on his medication list.  I previously recommended to stop glimepiride when he switched to mealtime insulin...  Insulin pump: - Omnipod 5  CGM: - Dexcom G6  Insulin: - Humalog U200 now  Reviewed HbA1c levels: Lab Results  Component Value Date   HGBA1C 6.4 01/08/2022   HGBA1C 6.2 (A) 11/06/2021   HGBA1C 6.5 (A) 08/07/2021   HGBA1C 7.5 (A) 04/03/2021   HGBA1C 7.7 (H) 01/08/2021   HGBA1C 7.2 (A) 11/27/2020   HGBA1C 7.5 (A) 08/29/2020   HGBA1C 8.4 (A) 07/01/2020   HGBA1C 6.9 (A) 03/11/2020   HGBA1C 7.0 (H) 01/26/2020   Patient was on a regimen of: - Metformin 500 mg in am and 1000 mg in pm >> 1500 mg at dinnertime.  Mom - Wilder Glade 5 >> 10 mg before breakfast-added 03/2020 - Glimepiride 4 mg in am - NovoLog (16)-(16)-(14-16) before B-L-D - Tresiba 10 >> 26 >> 30 >> 32 >> 30 units daily-added 06/2020 >> now 24 units She was also tried on Rybelsus but this was expensive.  He then tried Ozempic but he developed nausea and vomiting and then increase lipase on 02/27/2020 (82  >> 119) on the 0.5 mg dose.  Ozempic was stopped at that time. Prev. On Januvia >> tolerated well but expensive.  Currently on: - Farxiga 10 mg before b'fast - Metformin 1500 mg with dinner - but actually also taking  glimepiride 4 mg before breakfast  - Insulin pump: - Basal rates: 12 am: 2 units/h - Insulin to carb ratio: 12 am: 1:10 >> 1:9 >> 1:8 - Target: 12 am: 120-150 >> 110-120 >> changed back to 120-150 - Correction factor (insulin sensitivity factor):  12 am: 35 >> 70 - Active insulin time: 4h - Changes infusion site: q3 days  Total daily dose from basal insulin: 24 units >> 22.6 units (44%) Total daily dose from bolus insulin: 25-36 units >> 28.5 units (56%) TDD: 51-100 units  Pt checks his sugars >4x a day:   Previously:  Prev.:   Lowest sugar was 70 >> 70s >> 60; he has hypoglycemia awareness in the 60s.  Highest sugar was 250 >> 300 (chinese foods) >> 300.  Glucometer: OneTouch ultra  Pt's meals are: - Breakfast: 1 egg + toast or cereal or oatmeal - Lunch: grilled fish + veggies - Dinner: meat + veggies - Snacks: 2: nuts, wheat crackers He walks 3 miles 3 days a week for exercise.  He finished cardiac rehab.   -No CKD, last BUN/creatinine:  Lab Results  Component Value Date   BUN 18 01/08/2022   BUN 27 (H) 01/08/2021   CREATININE 1.13 01/08/2022   CREATININE 1.30 01/08/2021  On lisinopril 20 twice a day.  -+ HL; last set of lipids: Lab Results  Component Value Date  CHOL 83 01/08/2022   HDL 23.00 (L) 01/08/2022   LDLCALC 34 01/08/2022   LDLDIRECT 35.0 01/08/2021   TRIG 127.0 01/08/2022   CHOLHDL 4 01/08/2022  On atorvastatin 80 mg daily, fenofibrate 160, co-Q10  On ASA 81.  - last eye exam was in 11/2020: No DR reportedly  - no numbness and tingling in his feet.  Last foot exam 01/15/2022.  Pt has FH of DM in father in his 67s.  He was taken off amiodarone.    ROS: + See HPI  I reviewed pt's medications, allergies, PMH, social hx, family hx, and changes were documented in the history of present illness. Otherwise, unchanged from my initial visit note.  Past Medical History:  Diagnosis Date   Allergy    Amoxicillin   Cataract 2017   Mild  progression   Coronary artery disease    a. s/p 5-vessel CABG (LIMA-LAD, SVG-diag, sequential SVG-OM1-OM 2, & SVG-PDA)   Diabetes mellitus with complication (HCC)    Hyperlipidemia    Hypertension    Insomnia    neuropathy    bilateral feet   Postoperative atrial fibrillation [facility])    Past Surgical History:  Procedure Laterality Date   BACK SURGERY     CARDIAC CATHETERIZATION     01/19/2020   COLONOSCOPY WITH PROPOFOL N/A 04/01/2021   Procedure: COLONOSCOPY WITH PROPOFOL;  Surgeon: Lucilla Lame, MD;  Location: [facility] ENDOSCOPY;  Service: Endoscopy;  Laterality: N/A;   CORONARY ARTERY BYPASS GRAFT N/A 01/29/2020   Procedure: CORONARY ARTERY BYPASS GRAFTING (CABG) TIMES FIVE USING LIMA to LAD; ENDOSCOPIC HARVESTED GREATER SAPHENOUS VEIN: SVG to PDA; SVG to sequenced OM1 & OM2.;  Surgeon: Ivin Poot, MD;  Location: [place];  Service: Open Heart Surgery;  Laterality: N/A;   ENDOVEIN HARVEST OF GREATER SAPHENOUS VEIN Bilateral 01/29/2020   Procedure: ENDOVEIN HARVEST OF GREATER SAPHENOUS VEIN;  Surgeon: Ivin Poot, MD;  Location: [place];  Service: Open Heart Surgery;  Laterality: Bilateral;   EYE SURGERY  Radial K   1997   LAMINECTOMY  1993   L5-S1   LEFT HEART CATH AND CORONARY ANGIOGRAPHY N/A 01/19/2020   Procedure: LEFT HEART CATH AND CORONARY ANGIOGRAPHY;  Surgeon: Wellington Hampshire, MD;  Location: [place] CV LAB;  Service: Cardiovascular;  Laterality: N/A;   MENISCUS REPAIR  1996   TEE WITHOUT CARDIOVERSION N/A 01/29/2020   Procedure: TRANSESOPHAGEAL ECHOCARDIOGRAM (TEE);  Surgeon: Prescott Gum, Collier Salina, MD;  Location: [place];  Service: Open Heart Surgery;  Laterality: N/A;   TONSILLECTOMY     Social History   Socioeconomic History   Marital status: Married    Spouse name: Not on file   Number of children: Not on file   Years of education: Not on file   Highest education level: Not on file  Occupational History   Occupation: CFO  Tobacco Use   Smoking status: Never    Smokeless tobacco: Never  Vaping Use   Vaping Use: Never used  Substance and Sexual Activity   Alcohol use: Not Currently    Alcohol/week: 0.0 standard drinks of alcohol    Comment: Very rare- occasionaly beer or wine    Drug use: No   Sexual activity: Yes    Birth control/protection: None  Other Topics Concern   Not on file  Social History Narrative   Married 1970.   [employer], Oceanographer at [employer].   Curator, land and sea.     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   Social Determinants of Health   Financial Resource Strain: Low Risk  (12/22/2021)   Overall Financial Resource Strain (CARDIA)    Difficulty of Paying Living Expenses: Not hard at all  Food Insecurity: No Food Insecurity (12/22/2021)   Hunger Vital Sign    Worried About Running Out of Food in the Last Year: Never true    Ran Out of Food in the Last Year: Never true  Transportation Needs: No Transportation Needs (12/22/2021)   PRAPARE - Hydrologist (Medical): No    Lack of Transportation (Non-Medical): No  Physical Activity: Sufficiently Active (12/22/2021)   Exercise Vital Sign    Days of Exercise per Week: 4 days    Minutes of Exercise per Session: 40 min  Stress: No Stress Concern Present (12/22/2021)   [place]    Feeling of Stress : Not at all  Social Connections: [place] (12/22/2021)   Social Connection and Isolation Panel [NHANES]    Frequency of Communication with Friends and Family: More than three times a week    Frequency of Social Gatherings with Friends and Family: Not on file    Attends Religious Services: More than 4 times per year    Active Member of [employer] or Organizations: Yes    Attends Archivist Meetings: More than 4 times per year    Marital Status: Married  Human resources officer  Violence: Not At Risk (12/22/2021)   Humiliation, Afraid, Rape, and Kick questionnaire    Fear of Current or Ex-Partner: No    Emotionally Abused: No    Physically Abused: No    Sexually Abused: No   Current Outpatient Medications on File Prior to Visit  Medication Sig Dispense Refill   amLODipine (NORVASC) 10 MG tablet TAKE 1 TABLET BY MOUTH EVERY DAY 90 tablet 3   atorvastatin (LIPITOR) 80 MG tablet Take 1 tablet (80 mg total) by mouth daily. 90 tablet 3   carvedilol (COREG) 12.5 MG tablet Take 1 tablet (12.5 mg total) by mouth 2 (two) times daily. 180 tablet 3   cholecalciferol (VITAMIN D3) 25 MCG (1000 UNIT) tablet Take 1,000 Units by mouth daily.     Coenzyme Q10 (COQ10) 100 MG CAPS Take 100 mg by mouth daily.      Continuous Blood Gluc Transmit (DEXCOM G6 TRANSMITTER) MISC 1 Device by Does not apply route every 3 (three) months. E11.65 1 each 3   FARXIGA 10 MG TABS tablet TAKE 1 TABLET BY MOUTH EVERY DAY BEFORE BREAKFAST 30 tablet 11   fenofibrate 160 MG tablet TAKE 1 TABLET BY MOUTH EVERY DAY 90 tablet 1   glimepiride (AMARYL) 4 MG tablet TAKE 1 TABLET BY MOUTH BEFORE BREAKFAST AND SUPPER 180 tablet 3   glucose blood (ONETOUCH ULTRA) test strip TEST 2X A DAY 200 strip 3   GVOKE HYPOPEN 1-PACK 1 MG/0.2ML SOAJ Inject 0.2 mg under skin as needed for low blood sugars 0.2 mL prn   Insulin Disposable Pump (OMNIPOD 5 G6 POD, GEN 5,) MISC Change pod every 2 days 30 each 3   insulin lispro (HUMALOG KWIKPEN) 200 UNIT/ML KwikPen Use as instructed in the insulin pump max daily 145 units 69 mL 3   Insulin Pen Needle 32G X 4 MM MISC Use 4x a day 300 each 3   metFORMIN (GLUCOPHAGE) 1000 MG tablet TAKE 1 TABLET BY MOUTH TWICE A DAY 180 tablet 3  Multiple Vitamins-Minerals (PRESERVISION AREDS 2 PO) Take 1 tablet by mouth 2 (two) times daily.      Omega-3 Fatty Acids (FISH OIL) 1000 MG CAPS Take 1-2 capsules (1,000-2,000 mg total) by mouth in the morning and at bedtime.  0   sacubitril-valsartan  (ENTRESTO) 49-51 MG Take 1 tablet by mouth 2 (two) times daily. 60 tablet 5   sildenafil (VIAGRA) 100 MG tablet Take 1 tablet (100 mg total) by mouth as needed for erectile dysfunction. 30 tablet 6   traZODone (DESYREL) 100 MG tablet TAKE 1 TABLET BY MOUTH EVERYDAY AT BEDTIME 90 tablet 3   Zinc Oxide 15 MG TBDP 2 tabs per day     No current facility-administered medications on file prior to visit.   Allergies  Allergen Reactions   Ozempic (0.25 Or 0.5 Mg-Dose) [Semaglutide(0.25 Or 0.5mg -Dos)]     Presumed cause of GI upset.     Amoxil [Amoxicillin] Rash   Family History  Problem Relation Age of Onset   Hypertension Mother    Diabetes Father    Cancer Neg Hx    COPD Neg Hx    Heart disease Neg Hx    Hyperlipidemia Neg Hx    Stroke Neg Hx    Colon cancer Neg Hx    Prostate cancer Neg Hx    PE: There were no vitals taken for this visit. Wt Readings from Last 3 Encounters:  01/15/22 176 lb (79.8 kg)  12/23/21 179 lb (81.2 kg)  11/25/21 181 lb (82.1 kg)   Constitutional: normal weight, in NAD Eyes: EOMI, no exophthalmos ENT: no thyromegaly, no cervical lymphadenopathy Cardiovascular: RRR, No MRG Respiratory: CTA B Musculoskeletal: no deformities Skin:  no rashes Neurological: no tremor with outstretched hands  ASSESSMENT: 1. DM2, insulin-dependent, uncontrolled, with complications - CAD, s/p CABG x5 - CKD  Component     Latest Ref Rng & Units 08/29/2020          Glucose     65 - 99 mg/dL 170 (H)  Hemoglobin A1C     4.0 - 5.6 % 7.5 (A)  C-Peptide     0.80 - 3.85 ng/mL 4.30 (H)  Islet Cell Ab     Neg:<1:1 Negative  ZNT8 Antibodies     <15 U/mL <10  Glutamic Acid Decarb Ab     <5 IU/mL <5  No insulin deficiency or pancreatic autoimmunity.  He has good insulin production.  Of note, we cannot use a GLP-1 receptor agonist for him due to previously elevated lipase.  2. HL  PLAN:  1. Patient with longstanding, uncontrolled, type 2 diabetes, on oral antidiabetic  regimen with metformin and SGLT2 inhibitor and also on an OmniPod insulin pump integrated with the Dexcom CGM.  At last visit, reviewing the CGM trends, sugars were excellent overnight, all in target range but they were increasing slightly after breakfast and particularly so after lunch.  Mid afternoon, he had an occasional hyperglycemic peak, but this was more rare.  After dinner, after an initial slight increase in blood sugars, they were decreasing to the target range.  I advised him to strengthen his insulin to carb ratio at that time.  HbA1c was excellent, at 6.2%.  He had another HbA1c since then, in 01/2022 and this was slightly higher, at 6.4%, but still at goal. CGM interpretation: -At today's visit, we reviewed his CGM downloads: It appears that 80% of values are in target range (goal >70%), while 20% are higher than 180 (goal <25%), and 0% are  lower than 70 (goal <4%).  The calculated average blood sugar is 147.  The projected HbA1c for the next 3 months (GMI) is 6.8%. -Reviewing the CGM trends, sugars are fluctuating within the target range at night and they increase after every meal, with occasional spikes above the 180 mg/dL mark.  He feels that these are mostly related to the holidays.  He is injecting insulin approximately 15 to 20 minutes before meals.  For now, I did not suggest a change in his insulin to carb ratios. -He does mention that up until approximately 1.5 months ago, he was getting a lot of low blood sugars at night.  They went ahead and stop the glimepiride at that time.  Reviewing his chart, he was supposed to stop glimepiride ever since 04/2021, but they forgot about the advised.  As of now, he is still taking glimepiride in the morning and we will go ahead and stop this as I explained that even the morning sulfonylurea can increase the risk of hypoglycemia at night.  Moreover, to avoid further low blood sugars during the night, I advised him to lower his starting basal rate from  12 AM to 6 AM.  They increase the target blood sugars since last visit and we will continue with this.  I explained about situations in which he can change the target, for example for exercise, which he usually does late afternoon. - I suggested to:  Patient Instructions  Please continue: - Farxiga 10 mg before b'fast - Metformin 1500 mg with dinner  STOP all Glimepiride.  Use the following pump settings: - Basal rates: 12 am: 2 >> 1.7 units/h 6 am: 2  - Insulin to carb ratio: 12 am: 1:8 - Target: 12 am: 120-150 - Correction factor (insulin sensitivity factor):  12 am: 70 - Active insulin time: 4h - Changes infusion site: q3 days  Please return in 3-4 months.  - we checked his HbA1c: 5.9% (lowest he had in a long time) - advised to check sugars at different times of the day - 4x a day, rotating check times - advised for yearly eye exams >> he is not UTD - return to clinic in 3-4 months  2. HL -Reviewed latest lipid panel from 01/2022: Fractions at goal with the exception of a low HDL: Lab Results  Component Value Date   CHOL 83 01/08/2022   HDL 23.00 (L) 01/08/2022   LDLCALC 34 01/08/2022   LDLDIRECT 35.0 01/08/2021   TRIG 127.0 01/08/2022   CHOLHDL 4 01/08/2022  -He continues on atorvastatin 80 mg daily and fenofibrate 160 mg daily without side effects  Philemon Kingdom, MD PhD [facility]

## 2022-03-23 ENCOUNTER — Other Ambulatory Visit: Payer: Self-pay | Admitting: Cardiovascular Disease

## 2022-03-23 ENCOUNTER — Encounter: Payer: Self-pay | Admitting: Family Medicine

## 2022-03-23 NOTE — Telephone Encounter (Signed)
Please advise.  Please see pharmacy comments. Thank you.

## 2022-03-25 ENCOUNTER — Telehealth: Payer: Self-pay

## 2022-03-25 ENCOUNTER — Other Ambulatory Visit: Payer: Self-pay | Admitting: Cardiovascular Disease

## 2022-03-25 NOTE — Telephone Encounter (Signed)
I called his pharmacy, sounds like a high deductible issue. Agree with changing over to valsartan instead for lower cost.

## 2022-03-25 NOTE — Telephone Encounter (Signed)
Pt calling stating that his medication Delene Loll is too expensive and he would like for Dr. Rockey Situ to prescribe the medication that he was on before. Pt wants the nurse to give him a call back concerning this matter. Please address

## 2022-03-25 NOTE — Telephone Encounter (Signed)
Pt called stating he is unable to afford Entresto. Nurse discussed [employer] patient assistance with pt. Pt would like to apply. Application placed in the mail.

## 2022-03-25 NOTE — Telephone Encounter (Signed)
Minna Merritts, MD  Sent: Wed March 25, 2022  2:40 PM  To: [facility] Triage         Message  Perhaps we can check with one of our pharmacist  Suspect beginning of the year, may be mistaking deductible that needs to be reached in January with the cost of Entresto  Often Medicare first $500 is a deductible then insurance will start covering  Though clearly not the same, if Entresto not possible, would start valsartan 160 mg daily in its place  Thx  TG

## 2022-03-25 NOTE — Telephone Encounter (Signed)
Per pharmacy note received via Epic: Pharmacy comment: Alternative Requested:PATIENT DOES NOT WANT TO PAY THE $578.27 [place]. PLEASE ADVISE.     To Dr. Rockey Situ to review. This early in the year I am uncertain if he will qualify for assistance at this time.

## 2022-03-25 NOTE — Telephone Encounter (Signed)
Please see below regarding Entresto.

## 2022-03-25 NOTE — Telephone Encounter (Signed)
To Pharm D to assist/ offer further recommendations.

## 2022-03-26 MED ORDER — VALSARTAN 160 MG PO TABS: 160.0000 mg | ORAL_TABLET | Freq: Every day | ORAL | 3 refills | Status: DC

## 2022-03-26 NOTE — Telephone Encounter (Signed)
Pt made aware of recommendations to stop entresto and start valsartan 160 mg daily due to cost. Pt verbalized understanding Medication list updated

## 2022-03-30 ENCOUNTER — Other Ambulatory Visit (HOSPITAL_COMMUNITY): Payer: Self-pay

## 2022-04-01 ENCOUNTER — Encounter: Payer: Self-pay | Admitting: Internal Medicine

## 2022-04-01 ENCOUNTER — Other Ambulatory Visit: Payer: Self-pay | Admitting: Family Medicine

## 2022-04-01 DIAGNOSIS — M79646 Pain in unspecified finger(s): Secondary | ICD-10-CM

## 2022-04-06 ENCOUNTER — Other Ambulatory Visit (HOSPITAL_COMMUNITY): Payer: Self-pay

## 2022-04-09 ENCOUNTER — Other Ambulatory Visit (HOSPITAL_COMMUNITY): Payer: Self-pay

## 2022-04-10 ENCOUNTER — Other Ambulatory Visit (HOSPITAL_COMMUNITY): Payer: Self-pay

## 2022-04-16 ENCOUNTER — Other Ambulatory Visit (HOSPITAL_COMMUNITY): Payer: Self-pay

## 2022-04-16 ENCOUNTER — Telehealth: Payer: Self-pay

## 2022-04-16 NOTE — Telephone Encounter (Signed)
PA request received via CMM for Omnipod 5 G6 Pod (Gen 5)  PA has been submitted to [facility] and is pending determination.   Key: [id number]

## 2022-04-24 ENCOUNTER — Other Ambulatory Visit: Payer: Self-pay

## 2022-04-24 DIAGNOSIS — Z794 Long term (current) use of insulin: Secondary | ICD-10-CM

## 2022-04-24 NOTE — Telephone Encounter (Signed)
Pharmacy called to request a new rx for Humalog (vial) because pt is on a pump.

## 2022-04-27 ENCOUNTER — Other Ambulatory Visit (HOSPITAL_COMMUNITY): Payer: Self-pay

## 2022-04-27 NOTE — Telephone Encounter (Signed)
Pharmacy Patient Advocate Encounter  Prior Authorization for Omnipod has been approved.  For up to 15 for a 30 day supply   PA# PA Case ID: [id number] Effective dates: 04/16/22 until further notice.  Filled 04/26/22  No PA needed for Wilder Glade, but 1 month copay is $136.77( says this amount is attributed to the coverage gap)

## 2022-04-30 ENCOUNTER — Telehealth: Payer: Self-pay | Admitting: Dietician

## 2022-04-30 ENCOUNTER — Other Ambulatory Visit (HOSPITAL_COMMUNITY): Payer: Self-pay

## 2022-04-30 NOTE — Telephone Encounter (Signed)
Patient's wife Denver Faster called and left a message.  Returned her call.  It appears that the Omnipod 5 has been approved with a prior authorization this month but she states the cost is $1,000 for 3 months supply of pods.  She wonders if the t:slim could be an option as this is better covered under DME. Left message for Vaughan Basta to call [facility].  Antonieta Iba, RD, LDN, CDCES

## 2022-05-05 NOTE — Telephone Encounter (Signed)
Wife was told to go on line to [email or web address] and put in insurance information to see what his cost will be.  She agreed to do this and let me know if she wants to continue with this pump.

## 2022-05-12 ENCOUNTER — Telehealth: Payer: Self-pay | Admitting: Nutrition

## 2022-05-12 NOTE — Telephone Encounter (Signed)
Wife called saying that they have 5 more pods (15 days) until they will have to pay an additional $560.00 for 5 more pods.  She is requesting that the paperwork that was sent to you by [employer] be filled out ASAP so  that they can get this pump ASAP.

## 2022-05-12 NOTE — Telephone Encounter (Signed)
Spoke with pt and advised order was placed on [employer] 05/07/22 and requested labs provided 05/08/22.

## 2022-05-18 ENCOUNTER — Telehealth: Payer: Self-pay

## 2022-05-18 NOTE — Telephone Encounter (Signed)
Inbound call from DME supplier requesting form be completed and faxed with clinical notes. DME supplies ordered via [employer] through online portal.  

## 2022-05-19 DIAGNOSIS — M65311 Trigger thumb, right thumb: Secondary | ICD-10-CM | POA: Diagnosis not present

## 2022-05-19 DIAGNOSIS — M65312 Trigger thumb, left thumb: Secondary | ICD-10-CM | POA: Diagnosis not present

## 2022-05-20 ENCOUNTER — Other Ambulatory Visit: Payer: Self-pay | Admitting: Internal Medicine

## 2022-05-20 ENCOUNTER — Encounter: Payer: Self-pay | Admitting: Internal Medicine

## 2022-05-20 DIAGNOSIS — Z794 Long term (current) use of insulin: Secondary | ICD-10-CM

## 2022-05-25 ENCOUNTER — Other Ambulatory Visit (INDEPENDENT_AMBULATORY_CARE_PROVIDER_SITE_OTHER): Payer: Medicare Other

## 2022-05-25 ENCOUNTER — Ambulatory Visit: Payer: Medicare Other | Admitting: Cardiovascular Disease

## 2022-05-25 DIAGNOSIS — E1159 Type 2 diabetes mellitus with other circulatory complications: Secondary | ICD-10-CM | POA: Diagnosis not present

## 2022-05-25 DIAGNOSIS — Z794 Long term (current) use of insulin: Secondary | ICD-10-CM | POA: Diagnosis not present

## 2022-05-26 ENCOUNTER — Ambulatory Visit: Payer: Medicare Other | Admitting: Cardiovascular Disease

## 2022-05-26 LAB — C-PEPTIDE: C-Peptide: 1.49 ng/mL (ref 0.80–3.85)

## 2022-05-26 LAB — GLUCOSE, FASTING: Glucose, Bld: 128 mg/dL — ABNORMAL HIGH (ref 65–99)

## 2022-06-09 ENCOUNTER — Telehealth: Payer: Self-pay

## 2022-06-09 DIAGNOSIS — E1165 Type 2 diabetes mellitus with hyperglycemia: Secondary | ICD-10-CM

## 2022-06-09 NOTE — Telephone Encounter (Signed)
Called to advise they spoke with [employer] and were advised they did not qualify based on C-peptide and fasting glucose results. Another test is IAA (insulin auto antibody test). Wants to get this checked asap. Does this have to be fasting>

## 2022-06-10 NOTE — Telephone Encounter (Signed)
Pt contacted and advised lab ordered.

## 2022-06-12 ENCOUNTER — Other Ambulatory Visit (INDEPENDENT_AMBULATORY_CARE_PROVIDER_SITE_OTHER): Payer: Medicare Other

## 2022-06-12 DIAGNOSIS — E1165 Type 2 diabetes mellitus with hyperglycemia: Secondary | ICD-10-CM

## 2022-06-12 DIAGNOSIS — E1159 Type 2 diabetes mellitus with other circulatory complications: Secondary | ICD-10-CM | POA: Diagnosis not present

## 2022-06-18 DIAGNOSIS — M65312 Trigger thumb, left thumb: Secondary | ICD-10-CM | POA: Diagnosis not present

## 2022-06-18 DIAGNOSIS — M65311 Trigger thumb, right thumb: Secondary | ICD-10-CM | POA: Diagnosis not present

## 2022-06-20 LAB — GAD65, IA-2, AND INSULIN AUTOANTIBODY SERUM
Glutamic Acid Decarb Ab: <5 IU/mL (ref <5)
IA-2 Antibody: <5.4 U/mL (ref <5.4)
Insulin Antibodies, Human: 3.2 U/mL — ABNORMAL HIGH (ref <0.4)

## 2022-06-21 ENCOUNTER — Other Ambulatory Visit: Payer: Self-pay | Admitting: Family Medicine

## 2022-06-22 IMAGING — DX DG CHEST 1V PORT
1 series · 1 of 1 positions shown · non-contrast
Comparison: None.

CLINICAL DATA: Status post CABG today.

EXAM:
PORTABLE CHEST 1 VIEW

[chest ap]
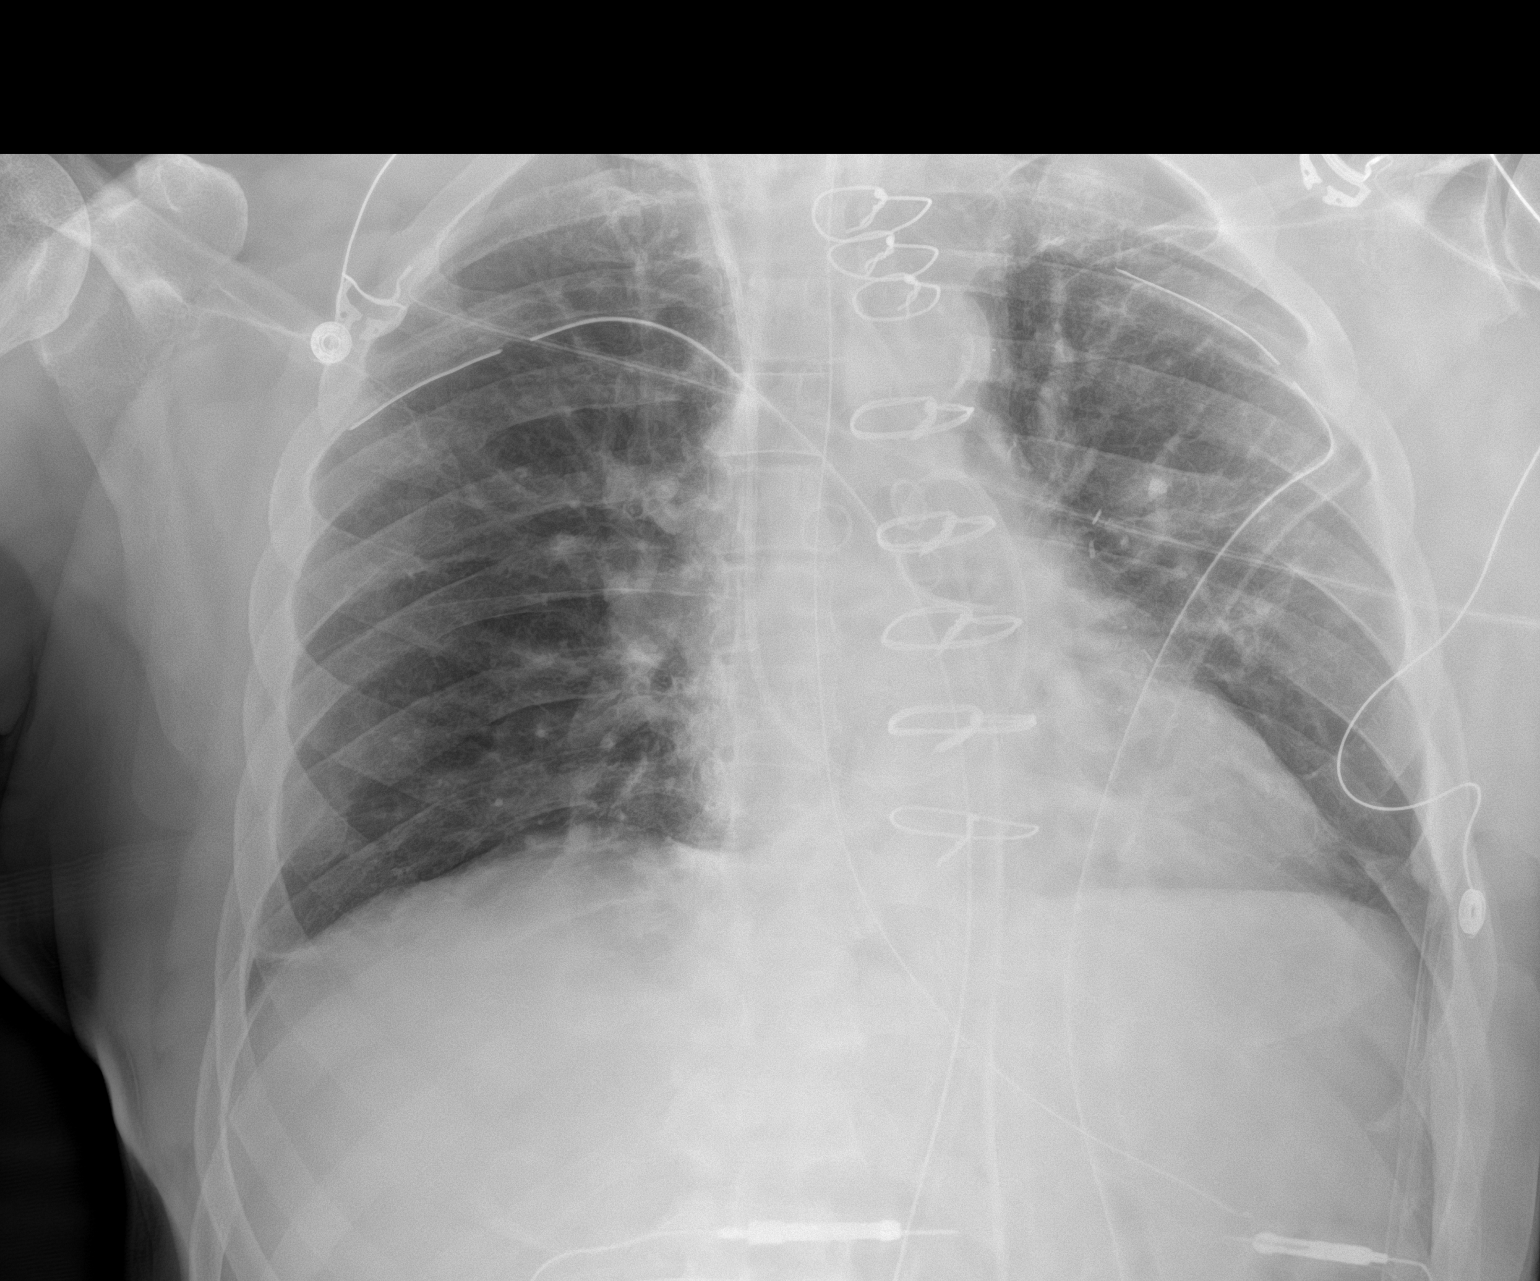

[1 of 1 positions shown; findings below may reference images not displayed]

FINDINGS: Endotracheal tube is in place with the tip at the level the
clavicular heads. A right IJ approach Swan-Ganz catheter is in
place. The catheter is looped in the pulmonary outflow tract with
the tip projecting retrograde, either in the pulmonary outflow tract
or left main pulmonary artery. Left chest tube and mediastinal drain
are seen. NG tube tip and side-port in the stomach. Lungs clear. No
pneumothorax or pleural fluid. Heart size normal.
IMPRESSION: Right IJ approach Swan-Ganz catheter appears looped in the pulmonary
outflow tract with the tip either in the left main pulmonary artery
or pulmonary outflow tract. Support apparatus is otherwise in good
position.

Negative for pneumothorax or acute disease.

## 2022-06-22 NOTE — Progress Notes (Signed)
T, His insulin antibodies were elevated, so we may be able to use this to hopefully get his insurance to cover the pump.   I see he has an appointment coming up on 4/25 and I will discuss with him at that time what this means. Ty! C

## 2022-06-23 IMAGING — DX DG CHEST 1V PORT
1 series · 1 of 1 positions shown · non-contrast
Comparison: 01/29/2020.

CLINICAL DATA: Chest tube.

EXAM:
PORTABLE CHEST 1 VIEW

[chest ap]
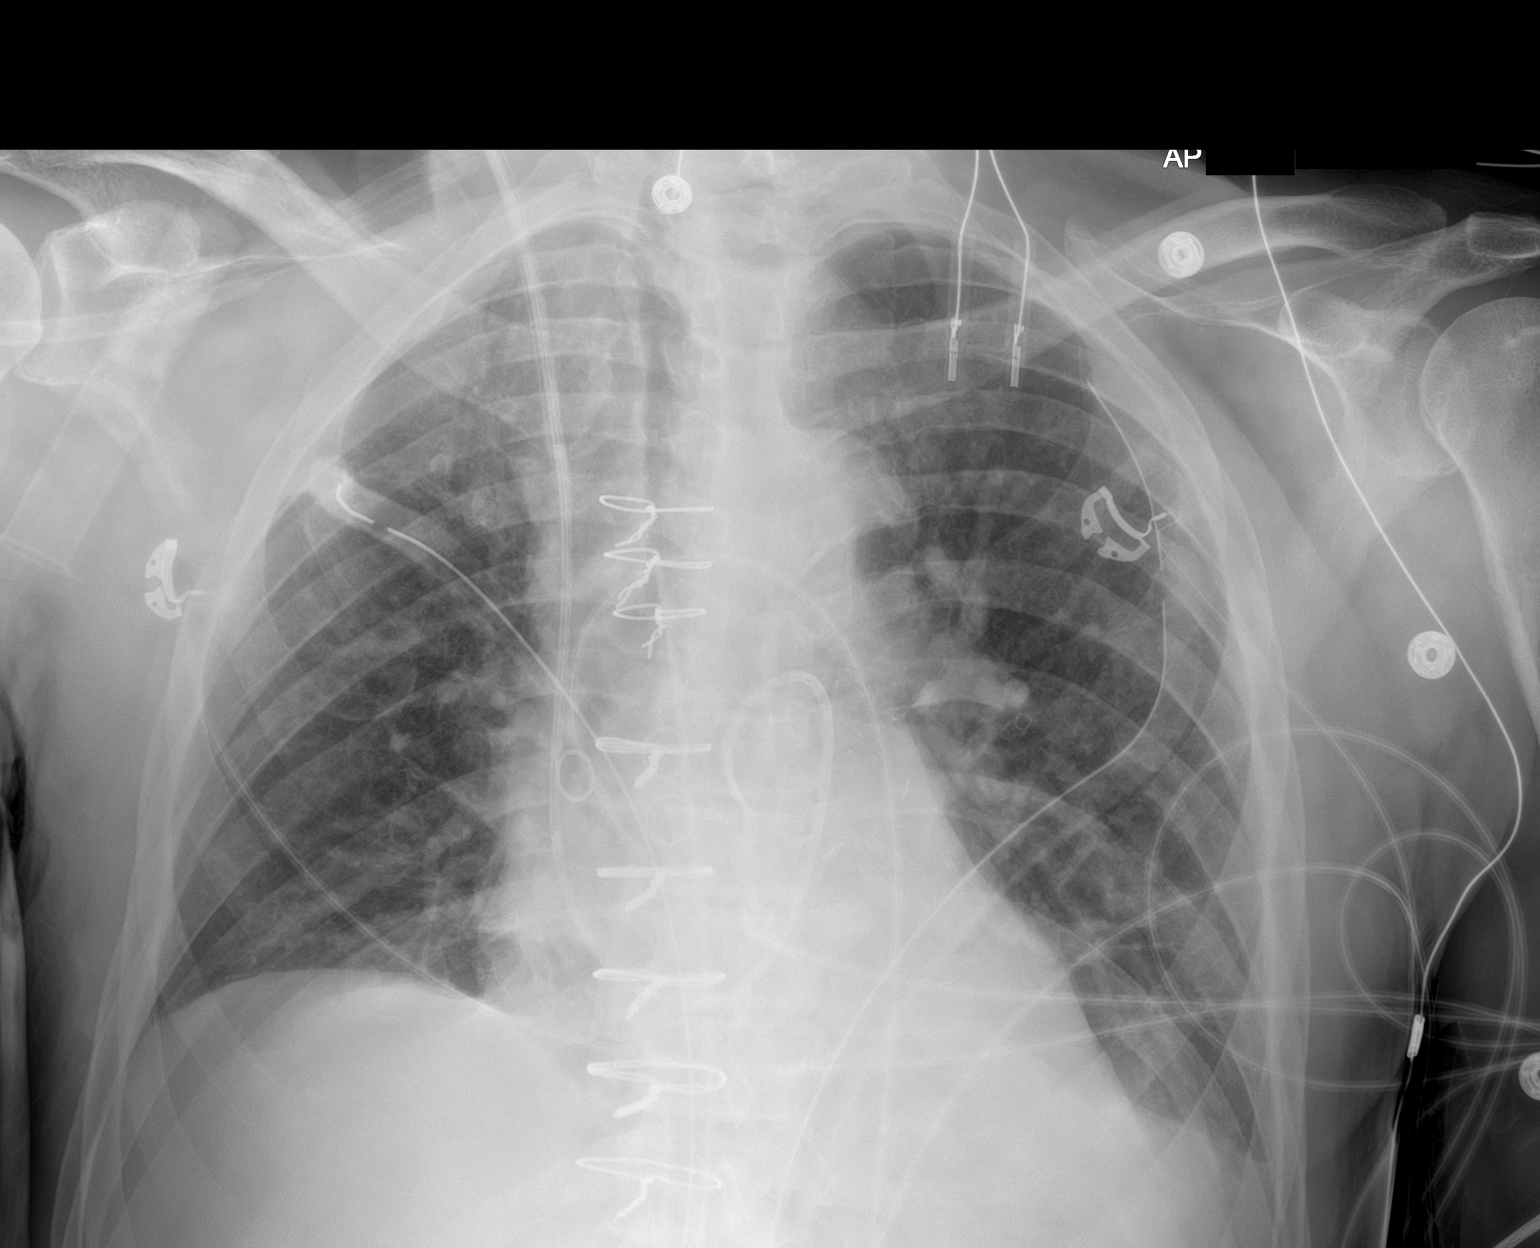

[1 of 1 positions shown; findings below may reference images not displayed]

FINDINGS: Interim extubation and removal of NG tube. Swan-Ganz catheter again
noted coiled in the pulmonary outflow tract. Left chest tube in
stable position. Prior CABG. Stable cardiomegaly. Persistent
bibasilar atelectasis. No pleural effusion or pneumothorax.
IMPRESSION: 1. Interim extubation and removal of NG tube. Swan-Ganz catheter
again noted coiled in the pulmonary outflow tract. Left chest tube
in stable position. No pneumothorax.
2. Prior CABG. Stable cardiomegaly.

## 2022-06-25 IMAGING — DX DG CHEST 1V PORT
1 series · 1 of 1 positions shown · non-contrast
Comparison: Chest radiograph 01/31/2020.

CLINICAL DATA: Status post open heart surgery.

EXAM:
PORTABLE CHEST 1 VIEW

[chest ap]
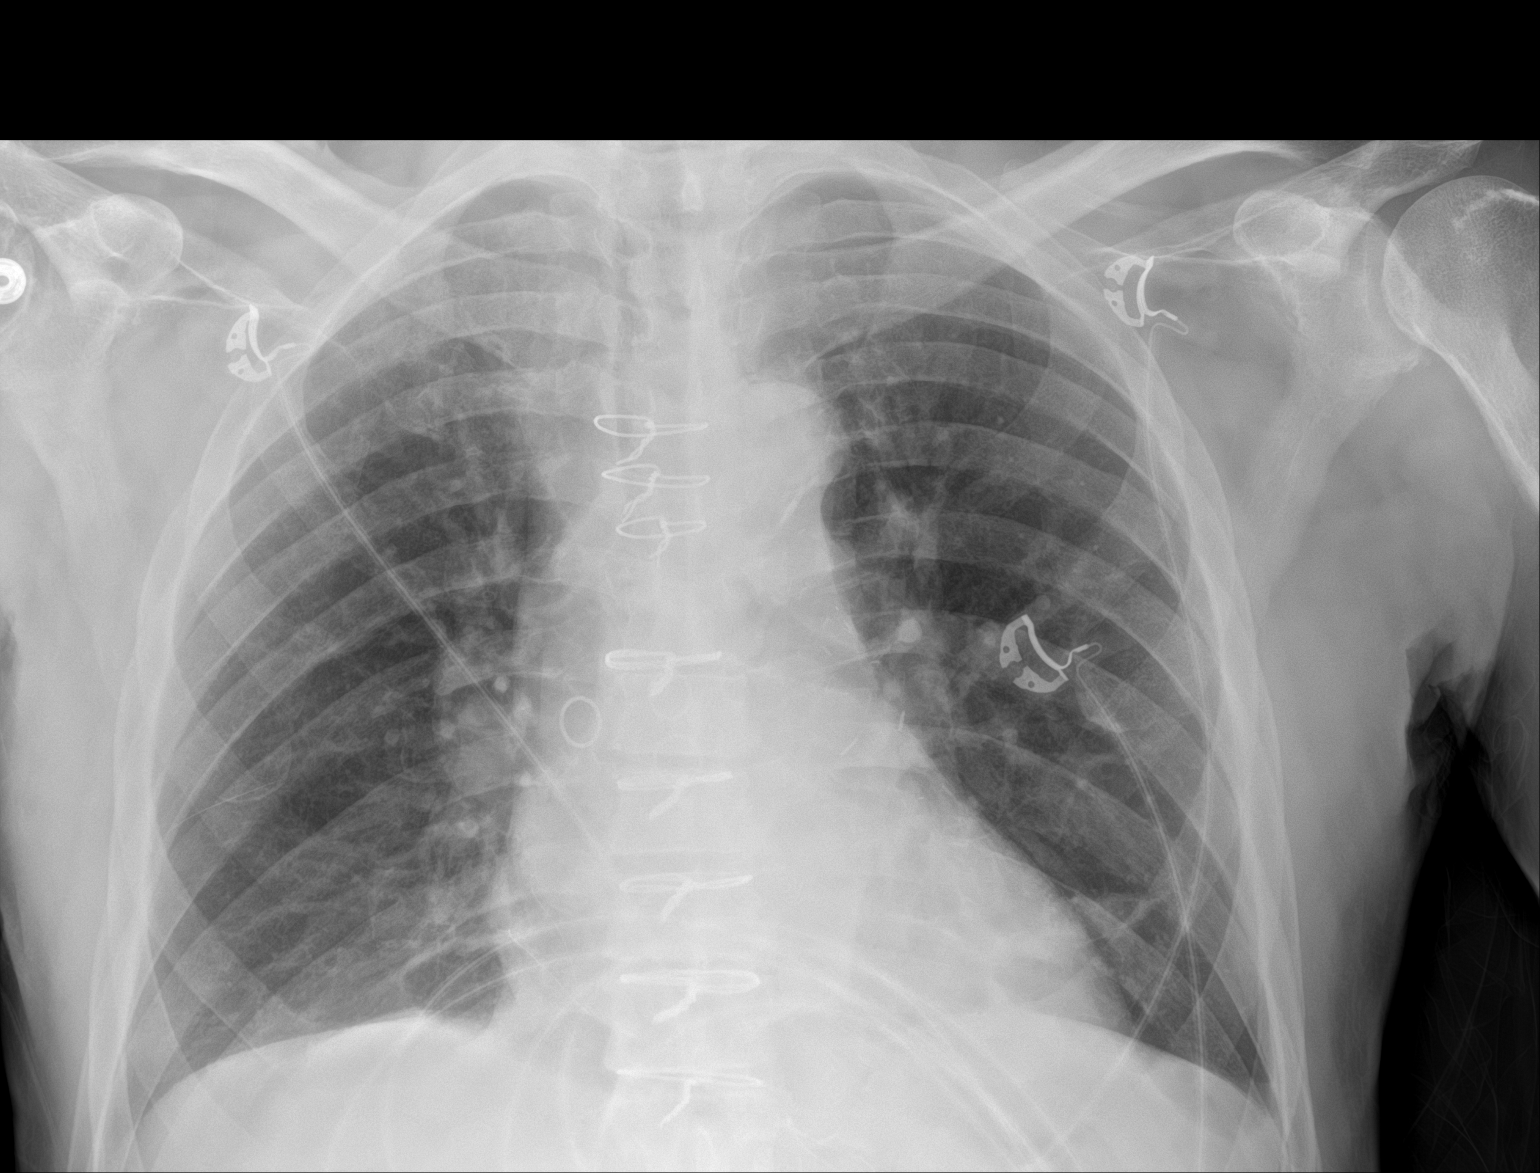

[1 of 1 positions shown; findings below may reference images not displayed]

FINDINGS: Monitoring leads overlie the patient. Stable cardiac and mediastinal
contours status post median sternotomy. Interval removal right IJ
sheath. Interval removal bilateral chest tubes. Minimal
heterogeneous opacities lung bases bilaterally. No pleural effusion
or pneumothorax. Osseous structures unremarkable.
IMPRESSION: Minimal heterogeneous opacities lung bases bilaterally favored to
represent atelectasis.

## 2022-06-29 NOTE — Progress Notes (Unsigned)
Cardiology Office Note  Date:  06/30/2022   ID:  Luis, Wise Jul 03, 1940, MRN [id number]  PCP:  Luis Nam, MD   Chief Complaint  Patient presents with   Follow-up    Patient denies new or acute cardiac problems/concerns today.      HPI:  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, CABG x5, November 2021 EF 45 to 50% on echo September 2023 Chronic left bundle branch block Who presents for f/u of his CAD and angina/chest pain  Last seen by myself in clinic October 2023  3 mile walks in the park, 3x a week Works at airport Denies chest pain or shortness of breath concerning for angina  Traveling, back from [place], going to see grandson in [place]  Numbness and pain down left arm has resolved  Lab work reviewed Total cholesterol 83 LDL 34 A1c 5.9  EKG personally reviewed by myself on todays visit Normal sinus rhythm with rate 60 bpm left bundle branch block, no change from prior EKGs  Prior echo  1. Left ventricular ejection fraction, by estimation, is 45 to 50%. The  left ventricle has mildly decreased function. The left ventricle  demonstrates regional wall motion abnormalities (anteroseptal wall  hypokinesis, possibly secondary to post-operative   state). There is mild left ventricular hypertrophy. Left ventricular  diastolic parameters are consistent with Grade I diastolic dysfunction  (impaired relaxation).   2. Right ventricular systolic function is normal. The right ventricular  size is normal.   3. The mitral valve is normal in structure. Mild mitral valve  regurgitation. No evidence of mitral stenosis.   4. The aortic valve is normal in structure. There is mild calcification  of the aortic valve. Aortic valve regurgitation is mild. Aortic valve   sclerosis is present, with no evidence of aortic valve stenosis.   5. The inferior vena cava is dilated in size with >50% respiratory  variability, suggesting right atrial pressure of 8 mmHg.   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  PSH:    Past Surgical History:  Procedure Laterality Date   BACK SURGERY     CARDIAC CATHETERIZATION     01/19/2020   COLONOSCOPY WITH PROPOFOL N/A 04/01/2021   Procedure: COLONOSCOPY WITH PROPOFOL;  Surgeon: Luis Minium, MD;  Location: [facility] ENDOSCOPY;  Service: Endoscopy;  Laterality: N/A;   CORONARY ARTERY BYPASS GRAFT N/A 01/29/2020   Procedure: CORONARY ARTERY BYPASS GRAFTING (CABG) TIMES FIVE USING LIMA to LAD; ENDOSCOPIC HARVESTED GREATER SAPHENOUS VEIN: SVG to PDA; SVG to sequenced OM1 & OM2.;  Surgeon: Luis Wise,  Luis Arista, MD;  Location: [facility] OR;  Service: Open Heart Surgery;  Laterality: N/A;   ENDOVEIN HARVEST OF GREATER SAPHENOUS VEIN Bilateral 01/29/2020   Procedure: ENDOVEIN HARVEST OF GREATER SAPHENOUS VEIN;  Surgeon: Luis Perna, MD;  Location: [facility] OR;  Service: Open  Heart Surgery;  Laterality: Bilateral;   EYE SURGERY  Radial K   1997   LAMINECTOMY  1993   L5-S1   LEFT HEART CATH AND CORONARY ANGIOGRAPHY N/A 01/19/2020   Procedure: LEFT HEART CATH AND CORONARY ANGIOGRAPHY;  Surgeon: Luis Ouch, MD;  Location: [facility] INVASIVE CV LAB;  Service: Cardiovascular;  Laterality: N/A;   MENISCUS REPAIR  1996   TEE WITHOUT CARDIOVERSION N/A 01/29/2020   Procedure: TRANSESOPHAGEAL ECHOCARDIOGRAM (TEE);  Surgeon: Luis Wise, Luis Arista, MD;  Location: [facility] OR;  Service: Open Heart Surgery;  Laterality: N/A;   TONSILLECTOMY      Current Outpatient Medications  Medication Sig Dispense Refill   amLODipine (NORVASC) 10 MG tablet TAKE 1 TABLET BY MOUTH EVERY DAY 90 tablet 3   atorvastatin (LIPITOR) 80 MG tablet Take 1 tablet (80 mg total) by mouth daily. 90 tablet 3   carvedilol (COREG) 12.5 MG tablet Take 1 tablet (12.5 mg total) by mouth 2 (two) times daily. 180 tablet 3   cholecalciferol (VITAMIN D3) 25 MCG (1000 UNIT) tablet Take 1,000 Units by mouth daily.     Coenzyme Q10 (COQ10) 100 MG CAPS Take 100 mg by mouth daily.      Continuous Blood Gluc Transmit (DEXCOM G6 TRANSMITTER) MISC 1 Device by Does not apply route every 3 (three) months. E11.65 1 each 3   FARXIGA 10 MG TABS tablet TAKE 1 TABLET BY MOUTH EVERY DAY BEFORE BREAKFAST 30 tablet 11   fenofibrate 160 MG tablet TAKE 1 TABLET BY MOUTH EVERY DAY 90 tablet 1   glucose blood (ONETOUCH ULTRA) test strip TEST 2X A DAY 200 strip 3   GVOKE HYPOPEN 1-PACK 1 MG/0.2ML SOAJ Inject 0.2 mg under skin as needed for low blood sugars 0.2 mL prn   Insulin Disposable Pump (OMNIPOD 5 G6 POD, GEN 5,) MISC Change pod every 2 days 30 each 3   insulin lispro (HUMALOG KWIKPEN) 200 UNIT/ML KwikPen Use as instructed in the insulin pump max daily 145 units 69 mL 3   Insulin Pen Needle 32G X 4 MM MISC Use 4x a day 300 each 3   metFORMIN (GLUCOPHAGE) 1000 MG tablet TAKE 1 TABLET BY MOUTH TWICE A DAY 180 tablet 3   Multiple  Vitamins-Minerals (PRESERVISION AREDS 2 PO) Take 1 tablet by mouth 2 (two) times daily.      Omega-3 Fatty Acids (FISH OIL) 1000 MG CAPS Take 1-2 capsules (1,000-2,000 mg total) by mouth in the morning and at bedtime.  0   sildenafil (VIAGRA) 100 MG tablet Take 1 tablet (100 mg total) by mouth as needed for erectile dysfunction. 30 tablet 6   traZODone (DESYREL) 100 MG tablet TAKE 1 TABLET BY MOUTH EVERYDAY AT BEDTIME 90 tablet 3   valsartan (DIOVAN) 160 MG tablet Take 1 tablet (160 mg total) by mouth daily. 90 tablet 3   Zinc Oxide 15 MG TBDP 2 tabs per day     No current facility-administered medications for this visit.     Allergies:   Ozempic (0.25 or 0.5 mg-dose) [semaglutide(0.25 or 0.5mg -dos)] and Amoxil [amoxicillin]   Social History:  The patient  reports that he has never smoked. He has never used smokeless tobacco. He reports that he does  not currently use alcohol. He reports that he does not use drugs.   Family History:   family history includes Diabetes in his father; Hypertension in his mother.    Review of Systems: Review of Systems  Constitutional: Negative.   HENT: Negative.    Respiratory: Negative.    Cardiovascular: Negative.   Gastrointestinal: Negative.   Musculoskeletal: Negative.   Neurological: Negative.        Left arm pain and numbness in bed when laying on left side  Psychiatric/Behavioral: Negative.    All other systems reviewed and are negative.  PHYSICAL EXAM: VS:  BP 126/62 (BP Location: Left Arm, Patient Position: Sitting, Cuff Size: Normal)   Pulse 60   Ht 5\' 9"  (1.753 m)   Wt 180 lb (81.6 kg)   SpO2 95%   BMI 26.58 kg/m  , BMI Body mass index is 26.58 kg/m. Constitutional:  oriented to person, place, and time. No distress.  HENT:  Head: Grossly normal Eyes:  no discharge. No scleral icterus.  Neck: No JVD, no carotid bruits  Cardiovascular: Regular rate and rhythm, no murmurs appreciated Pulmonary/Chest: Clear to auscultation  bilaterally, no wheezes or rails Abdominal: Soft.  no distension.  no tenderness.  Musculoskeletal: Normal range of motion Neurological:  normal muscle tone. Coordination normal. No atrophy Skin: Skin warm and dry Psychiatric: normal affect, pleasant   Recent Labs: 01/08/2022: ALT 28; BUN 18; Creatinine, Ser 1.13; Hemoglobin 15.3; Platelets 235.0; Potassium 4.0; Sodium 140; TSH 2.19    Lipid Panel Lab Results  Component Value Date   CHOL 83 01/08/2022   HDL 23.00 (L) 01/08/2022   LDLCALC 34 01/08/2022   TRIG 127.0 01/08/2022    Wt Readings from Last 3 Encounters:  06/30/22 180 lb (81.6 kg)  03/11/22 183 lb 9.6 oz (83.3 kg)  01/15/22 176 lb (79.8 kg)     ASSESSMENT AND PLAN:  Problem List Items Addressed This Visit       Cardiology Problems   Hyperlipemia   Hypertension associated with diabetes - Primary   Relevant Orders   EKG 12-Lead   Coronary atherosclerosis     Other   Diabetes mellitus   S/P CABG x 5  CAD with stable angina CABG x5, November 2021 Currently with no symptoms of angina. No further workup at this time. Continue current medication regimen. Previously started on Entresto, changed back to valsartan secondary to cost Continue aspirin, statin, beta-blocker,   Hyperlipidemia Tolerating Lipitor 80 daily Cholesterol is at goal on the current lipid regimen. No changes to the medications were made.  Essential hypertension Blood pressure is well controlled on today's visit. No changes made to the medications.  Diabetes type 2 with complications Followed by endocrinology A1c less than 6, weight stable  Left arm numbness, pain Reports symptoms have resolved   Total encounter time more than 30 minutes  Greater than 50% was spent in counseling and coordination of care with the patient   Signed, Dossie Arbour, M.D., Ph.D. [facility] [place], [place] [phone]

## 2022-06-30 ENCOUNTER — Ambulatory Visit: Payer: Medicare Other | Attending: Cardiovascular Disease | Admitting: Cardiovascular Disease

## 2022-06-30 ENCOUNTER — Encounter: Payer: Self-pay | Admitting: Cardiovascular Disease

## 2022-06-30 VITALS — BP 126/62 | HR 60 | Ht 69.0 in | Wt 180.0 lb

## 2022-06-30 DIAGNOSIS — E1159 Type 2 diabetes mellitus with other circulatory complications: Secondary | ICD-10-CM

## 2022-06-30 DIAGNOSIS — E782 Mixed hyperlipidemia: Secondary | ICD-10-CM | POA: Diagnosis not present

## 2022-06-30 DIAGNOSIS — Z951 Presence of aortocoronary bypass graft: Secondary | ICD-10-CM | POA: Diagnosis not present

## 2022-06-30 DIAGNOSIS — I25118 Atherosclerotic heart disease of native coronary artery with other forms of angina pectoris: Secondary | ICD-10-CM | POA: Insufficient documentation

## 2022-06-30 DIAGNOSIS — I152 Hypertension secondary to endocrine disorders: Secondary | ICD-10-CM | POA: Diagnosis not present

## 2022-06-30 DIAGNOSIS — Z794 Long term (current) use of insulin: Secondary | ICD-10-CM | POA: Diagnosis not present

## 2022-06-30 NOTE — Patient Instructions (Signed)
Medication Instructions:  No changes  If you need a refill on your cardiac medications before your next appointment, please call your pharmacy.   Lab work: No new labs needed  Testing/Procedures: No new testing needed  Follow-Up: At [facility], you and your health needs are our priority.  As part of our continuing mission to provide you with exceptional heart care, we have created designated Provider Care Teams.  These Care Teams include your primary Cardiologist (physician) and Advanced Practice Providers (APPs -  Physician Assistants and Nurse Practitioners) who all work together to provide you with the care you need, when you need it.  You will need a follow up appointment in 12 months  Providers on your designated Care Team:   Christopher Berge, NP Ryan Dunn, PA-C Cadence Furth, PA-C  COVID-19 Vaccine Information can be found at: [email or web address] For questions related to vaccine distribution or appointments, please email [email or web address] or call [phone].   

## 2022-07-02 ENCOUNTER — Ambulatory Visit (INDEPENDENT_AMBULATORY_CARE_PROVIDER_SITE_OTHER): Payer: Medicare Other | Admitting: Internal Medicine

## 2022-07-02 ENCOUNTER — Encounter: Payer: Self-pay | Admitting: Internal Medicine

## 2022-07-02 VITALS — BP 138/80 | HR 59 | Ht 69.0 in | Wt 183.0 lb

## 2022-07-02 DIAGNOSIS — E785 Hyperlipidemia, unspecified: Secondary | ICD-10-CM | POA: Diagnosis not present

## 2022-07-02 DIAGNOSIS — E1159 Type 2 diabetes mellitus with other circulatory complications: Secondary | ICD-10-CM

## 2022-07-02 DIAGNOSIS — Z794 Long term (current) use of insulin: Secondary | ICD-10-CM

## 2022-07-02 LAB — POCT GLYCOSYLATED HEMOGLOBIN (HGB A1C): Hemoglobin A1C: 6.1 % — AB (ref 4.0–5.6)

## 2022-07-02 NOTE — Patient Instructions (Addendum)
Please continue: - Farxiga 10 mg before b'fast - Metformin 1500 mg with dinner  Use the following pump settings: - Basal rates: 12 am: 2 units/h 6 am: 2  - Insulin to carb ratio: 12 am: 1:8 - Target: 12 am: 120-150 - Correction factor (insulin sensitivity factor):  12 am: 70 - Active insulin time: 4h - Changes infusion site: q3 days  Please return in 3-4 months.

## 2022-07-02 NOTE — Progress Notes (Signed)
Patient ID: Luis Wise, male   DOB: 18-Nov-1940, 82 y.o.   MRN: [id number]   HPI: Luis Wise is a 82 y.o.-year-old male, initially referred by his PCP, Dr. Para March, returning for follow-up: DM2, dx in 2004-2005, insulin-dependent, uncontrolled, with complications (CAD - s/p CABG x5, CKD).  He is here with his wife, who is a former Nurse, learning disability and offers part of the history especially related to medications, blood sugars, and diet.  Last visit 3 months ago.  Interim history: No increased urination, blurry vision, nausea, chest pain. They just returned from a trip to [place].  They have a [place] trip planned in 2 months.  Insulin pump: - Omnipod 5 >> $$ >> would like to change to t:slim X2 as this would be cheaper for him as it is considered durable medical equipment as opposed to the OmniPod pump which goes to his prescription benefits  CGM: - Dexcom G6  Insulin: - Humalog U200 now  Since last visit, he had difficulty obtaining insulin pump due to lack of insulin deficiency.  However, his anti-insulin antibodies were elevated: Component     Latest Ref Rng 05/25/2022 06/12/2022  Glutamic Acid Decarb Ab     <5 IU/mL  <5   IA-2 Antibody     <5.4 U/mL  <5.4   Insulin Antibodies, Human     <0.4 U/mL  3.2 (H)   Glucose     65 - 99 mg/dL 409 (H)    C-Peptide     0.80 - 3.85 ng/mL 1.49      Reviewed HbA1c levels: Lab Results  Component Value Date   HGBA1C 5.9 (A) 03/11/2022   HGBA1C 6.4 01/08/2022   HGBA1C 6.2 (A) 11/06/2021   HGBA1C 6.5 (A) 08/07/2021   HGBA1C 7.5 (A) 04/03/2021   HGBA1C 7.7 (H) 01/08/2021   HGBA1C 7.2 (A) 11/27/2020   HGBA1C 7.5 (A) 08/29/2020   HGBA1C 8.4 (A) 07/01/2020   HGBA1C 6.9 (A) 03/11/2020   Patient was on a regimen of: - Metformin 500 mg in am and 1000 mg in pm >> 1500 mg at dinnertime.  Mom - Marcelline Deist 5 >> 10 mg before breakfast-added 03/2020 - Glimepiride 4 mg in am - NovoLog (16)-(16)-(14-16) before B-L-D - Tresiba 10 >> 26 >> 30 >> 32 >> 30  units daily-added 06/2020 >> now 24 units She was also tried on Rybelsus but this was expensive.  He then tried Ozempic but he developed nausea and vomiting and then increase lipase on 02/27/2020 (80  >> 119) on the 0.5 mg dose.  Ozempic was stopped at that time. Prev. On Januvia >> tolerated well but expensive.  Currently on: - Farxiga 10 mg before b'fast - Metformin 1500 mg with dinner  - Insulin pump: - Basal rates: 12 am: 2 units/h   (did not decrease) - Insulin to carb ratio: 12 am: 1:8 - Target: 12 am: 120-150  - Correction factor (insulin sensitivity factor):  12 am: 70 - Active insulin time: 4h - Changes infusion site: q3 days  Total daily dose from basal insulin: 24 units >> 22.6 units (44%) Total daily dose from bolus insulin: 25-36 units >> 28.5 units (56%) TDD: 51-100 units  Pt checks his sugars >4x a day:  Prev.:  Previously:   Lowest sugar was 70 >> 70s >> 60; he has hypoglycemia awareness in the 60s.  Highest sugar was 250 >> 300 (chinese foods) >> 300.  Glucometer: OneTouch ultra  Pt's meals are: - Breakfast: 1 egg +  toast or cereal or oatmeal - Lunch: grilled fish + veggies - Dinner: meat + veggies - Snacks: 2: nuts, wheat crackers He walks 3 miles 3 days a week for exercise.  He finished cardiac rehab.   -No CKD, last BUN/creatinine:  Lab Results  Component Value Date   BUN 18 01/08/2022   BUN 27 (H) 01/08/2021   CREATININE 1.13 01/08/2022   CREATININE 1.30 01/08/2021  On lisinopril 20 twice a day.  -+ HL; last set of lipids: Lab Results  Component Value Date   CHOL 83 01/08/2022   HDL 23.00 (L) 01/08/2022   LDLCALC 34 01/08/2022   LDLDIRECT 35.0 01/08/2021   TRIG 127.0 01/08/2022   CHOLHDL 4 01/08/2022  On atorvastatin 80 mg daily, fenofibrate 160, co-Q10  On ASA 81.  - last eye exam was in 11/2020: No DR reportedly.  - no numbness and tingling in his feet.  Last foot exam 01/15/2022.  Pt has FH of DM in father in his 74s.  He  was taken off amiodarone.    ROS: + See HPI  I reviewed pt's medications, allergies, PMH, social hx, family hx, and changes were documented in the history of present illness. Otherwise, unchanged from my initial visit note.  Past Medical History:  Diagnosis Date   Allergy    Amoxicillin   Cataract 2017   Mild progression   Coronary artery disease    a. s/p 5-vessel CABG (LIMA-LAD, SVG-diag, sequential SVG-OM1-OM 2, & SVG-PDA)   Diabetes mellitus with complication    Hyperlipidemia    Hypertension    Insomnia    neuropathy    bilateral feet   Postoperative atrial fibrillation    Past Surgical History:  Procedure Laterality Date   BACK SURGERY     CARDIAC CATHETERIZATION     01/19/2020   COLONOSCOPY WITH PROPOFOL N/A 04/01/2021   Procedure: COLONOSCOPY WITH PROPOFOL;  Surgeon: Midge Minium, MD;  Location: [facility] ENDOSCOPY;  Service: Endoscopy;  Laterality: N/A;   CORONARY ARTERY BYPASS GRAFT N/A 01/29/2020   Procedure: CORONARY ARTERY BYPASS GRAFTING (CABG) TIMES FIVE USING LIMA to LAD; ENDOSCOPIC HARVESTED GREATER SAPHENOUS VEIN: SVG to PDA; SVG to sequenced OM1 & OM2.;  Surgeon: Kerin Perna, MD;  Location: [facility] OR;  Service: Open Heart Surgery;  Laterality: N/A;   ENDOVEIN HARVEST OF GREATER SAPHENOUS VEIN Bilateral 01/29/2020   Procedure: ENDOVEIN HARVEST OF GREATER SAPHENOUS VEIN;  Surgeon: Kerin Perna, MD;  Location: [facility] OR;  Service: Open Heart Surgery;  Laterality: Bilateral;   EYE SURGERY  Radial K   1997   LAMINECTOMY  1993   L5-S1   LEFT HEART CATH AND CORONARY ANGIOGRAPHY N/A 01/19/2020   Procedure: LEFT HEART CATH AND CORONARY ANGIOGRAPHY;  Surgeon: Iran Ouch, MD;  Location: [facility] INVASIVE CV LAB;  Service: Cardiovascular;  Laterality: N/A;   MENISCUS REPAIR  1996   TEE WITHOUT CARDIOVERSION N/A 01/29/2020   Procedure: TRANSESOPHAGEAL ECHOCARDIOGRAM (TEE);  Surgeon: Donata Clay, Theron Arista, MD;  Location: [facility] OR;  Service: Open Heart Surgery;  Laterality: N/A;    TONSILLECTOMY     Social History   Socioeconomic History   Marital status: Married    Spouse name: Not on file   Number of children: Not on file   Years of education: Not on file   Highest education level: Not on file  Occupational History   Occupation: CFO  Tobacco Use   Smoking status: Never   Smokeless tobacco: Never  Vaping Use   Vaping Use:  Never used  Substance and Sexual Activity   Alcohol use: Not Currently    Alcohol/week: 0.0 standard drinks of alcohol    Comment: Very rare- occasionaly beer or wine    Drug use: No   Sexual activity: Yes    Birth control/protection: None  Other Topics Concern   Not on file  Social History Narrative   Married 1970.   [employer], Scientist, water quality at [employer].   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   Social Determinants of Health   Financial Resource Strain: Low Risk  (12/22/2021)   Overall Financial Resource Strain (CARDIA)    Difficulty of Paying Living Expenses: Not hard at all  Food Insecurity: No Food Insecurity (12/22/2021)   Hunger Vital Sign    Worried About Running Out of Food in the Last Year: Never true    Ran Out of Food in the Last Year: Never true  Transportation Needs: No Transportation Needs (12/22/2021)   PRAPARE - Administrator, Civil Service (Medical): No    Lack of Transportation (Non-Medical): No  Physical Activity: Sufficiently Active (12/22/2021)   Exercise Vital Sign    Days of Exercise per Week: 4 days    Minutes of Exercise per Session: 40 min  Stress: No Stress Concern Present (12/22/2021)   [employer] of Occupational Health - Occupational Stress Questionnaire    Feeling of Stress : Not at all  Social Connections: Socially Integrated (12/22/2021)   Social Connection and Isolation Panel [NHANES]    Frequency of Communication with Friends and Family: More than three times a  week    Frequency of Social Gatherings with Friends and Family: Not on file    Attends Religious Services: More than 4 times per year    Active Member of [employer] or Organizations: Yes    Attends Banker Meetings: More than 4 times per year    Marital Status: Married  Catering manager Violence: Not At Risk (12/22/2021)   Humiliation, Afraid, Rape, and Kick questionnaire    Fear of Current or Ex-Partner: No    Emotionally Abused: No    Physically Abused: No    Sexually Abused: No   Current Outpatient Medications on File Prior to Visit  Medication Sig Dispense Refill   amLODipine (NORVASC) 10 MG tablet TAKE 1 TABLET BY MOUTH EVERY DAY 90 tablet 3   atorvastatin (LIPITOR) 80 MG tablet Take 1 tablet (80 mg total) by mouth daily. 90 tablet 3   carvedilol (COREG) 12.5 MG tablet Take 1 tablet (12.5 mg total) by mouth 2 (two) times daily. 180 tablet 3   cholecalciferol (VITAMIN D3) 25 MCG (1000 UNIT) tablet Take 1,000 Units by mouth daily.     Coenzyme Q10 (COQ10) 100 MG CAPS Take 100 mg by mouth daily.      Continuous Blood Gluc Transmit (DEXCOM G6 TRANSMITTER) MISC 1 Device by Does not apply route every 3 (three) months. E11.65 1 each 3   FARXIGA 10 MG TABS tablet TAKE 1 TABLET BY MOUTH EVERY DAY BEFORE BREAKFAST 30 tablet 11   fenofibrate 160 MG tablet TAKE 1 TABLET BY MOUTH EVERY DAY 90 tablet 1   glucose blood (ONETOUCH ULTRA) test strip TEST 2X A DAY 200 strip 3   GVOKE HYPOPEN 1-PACK 1 MG/0.2ML SOAJ Inject 0.2 mg under skin as needed for low blood sugars 0.2 mL prn   Insulin Disposable Pump (OMNIPOD  5 G6 POD, GEN 5,) MISC Change pod every 2 days 30 each 3   insulin lispro (HUMALOG KWIKPEN) 200 UNIT/ML KwikPen Use as instructed in the insulin pump max daily 145 units 69 mL 3   Insulin Pen Needle 32G X 4 MM MISC Use 4x a day 300 each 3   metFORMIN (GLUCOPHAGE) 1000 MG tablet TAKE 1 TABLET BY MOUTH TWICE A DAY 180 tablet 3   Multiple Vitamins-Minerals (PRESERVISION AREDS 2 PO) Take  1 tablet by mouth 2 (two) times daily.      Omega-3 Fatty Acids (FISH OIL) 1000 MG CAPS Take 1-2 capsules (1,000-2,000 mg total) by mouth in the morning and at bedtime.  0   sildenafil (VIAGRA) 100 MG tablet Take 1 tablet (100 mg total) by mouth as needed for erectile dysfunction. 30 tablet 6   traZODone (DESYREL) 100 MG tablet TAKE 1 TABLET BY MOUTH EVERYDAY AT BEDTIME 90 tablet 3   valsartan (DIOVAN) 160 MG tablet Take 1 tablet (160 mg total) by mouth daily. 90 tablet 3   Zinc Oxide 15 MG TBDP 2 tabs per day     No current facility-administered medications on file prior to visit.   Allergies  Allergen Reactions   Ozempic (0.25 Or 0.5 Mg-Dose) [Semaglutide(0.25 Or 0.5mg -Dos)]     Presumed cause of GI upset.     Amoxil [Amoxicillin] Rash   Family History  Problem Relation Age of Onset   Hypertension Mother    Diabetes Father    Cancer Neg Hx    COPD Neg Hx    Heart disease Neg Hx    Hyperlipidemia Neg Hx    Stroke Neg Hx    Colon cancer Neg Hx    Prostate cancer Neg Hx    PE: BP 138/80 (BP Location: Left Arm, Patient Position: Sitting, Cuff Size: Normal)   Pulse (!) 59   Ht 5\' 9"  (1.753 m)   Wt 183 lb (83 kg)   SpO2 97%   BMI 27.02 kg/m  Wt Readings from Last 3 Encounters:  07/02/22 183 lb (83 kg)  06/30/22 180 lb (81.6 kg)  03/11/22 183 lb 9.6 oz (83.3 kg)   Constitutional: normal weight, in NAD Eyes: EOMI, no exophthalmos ENT: no thyromegaly, no cervical lymphadenopathy Cardiovascular: RRR, No MRG Respiratory: CTA B Musculoskeletal: no deformities Skin:  no rashes Neurological: no tremor with outstretched hands  ASSESSMENT: 1. DM2, insulin-dependent, uncontrolled, with complications - CAD, s/p CABG x5 - CKD  Component     Latest Ref Rng & Units 08/29/2020          Glucose     65 - 99 mg/dL 409 (H)  Hemoglobin W1X     4.0 - 5.6 % 7.5 (A)  C-Peptide     0.80 - 3.85 ng/mL 4.30 (H)  Islet Cell Ab     Neg:<1:1 Negative  ZNT8 Antibodies     <15 U/mL <10   Glutamic Acid Decarb Ab     <5 IU/mL <5  No insulin deficiency or pancreatic autoimmunity.  He has good insulin production.  Of note, we cannot use a GLP-1 receptor agonist for him due to previously elevated lipase.  2. HL  PLAN:  1. Patient with longstanding, uncontrolled, type 2 diabetes, on oral antidiabetic regimen with metformin and SGLT2 inhibitor and also the OmniPod 5 insulin pump integrated with the Dexcom CGM.  At last visit, HbA1c was better, at 5.9% and upon questioning he was still on glimepiride, which was stopped. -Reviewing his CGM  tracings at last visit, the sugars were increasing after every meal, with occasional spikes above the 180 mg/dL target.  He felt that these were mostly related to the holidays.  He had some lows at night, which could have been related to glimepiride, which was stopped, but I also advised him to reduce the basal rate during the night.  I also recommended to change to target for exercise, which she was usually doing in the afternoon. CGM interpretation: -At today's visit, we reviewed his CGM downloads: It appears that 83% of values are in target range (goal >70%), while 17% are higher than 180 (goal <25%), and 0% are lower than 70 (goal <4%).  The calculated average blood sugar is 145.  The projected HbA1c for the next 3 months (GMI) is 6.8%. -Reviewing the CGM trends, sugars appear to be at goal overnight, and increase slightly after breakfast but a little bit more after dinner.  Upon questioning, the high blood sugars could be related to his recent trip to [place].  Overall, the sugars are still excellent so I did not suggest to change her regimen for now.  We did discuss that if the sugars remain elevated after dinner, he may need to enter more carbs into the pump for the meal. -OmniPod is expensive for him ($1000 for 3 months) at the beginning of the year so he is thinking about switching to t:slim.  They require insulin deficiency or positive  anti-pancreatic antibodies for approval.  His insulin production was normal, however, he did have positive anti-insulin antibodies.  With this information, we resubmitted the request but the insurance now requires an updated note.  We will forward them the note from today.  I would definitely recommend the patient to remain on an insulin pump as his diabetes control has significantly improved on the device.  We did discuss about differences between the t:slim and OmniPod pumps. - I suggested to:  Patient Instructions  Please continue: - Farxiga 10 mg before b'fast - Metformin 1500 mg with dinner  Use the following pump settings: - Basal rates: 12 am: 2 units/h 6 am: 2  - Insulin to carb ratio: 12 am: 1:8 - Target: 12 am: 120-150 - Correction factor (insulin sensitivity factor):  12 am: 70 - Active insulin time: 4h - Changes infusion site: q3 days  Please return in 3-4 months.  - we checked his HbA1c: 6.1% (slightly higher, but still excellent) - advised to check sugars at different times of the day - 4x a day, rotating check times - advised for yearly eye exams >> he is not UTD but plans to schedule this - return to clinic in 3-4 months  2. HL -Reviewed latest lipid panel from 01/2022: Fractions at goal with the exception of a low HDL: Lab Results  Component Value Date   CHOL 83 01/08/2022   HDL 23.00 (L) 01/08/2022   LDLCALC 34 01/08/2022   LDLDIRECT 35.0 01/08/2021   TRIG 127.0 01/08/2022   CHOLHDL 4 01/08/2022  -He continues atorvastatin 80 mg daily and fenofibrate 160 mg daily without side effects  Carlus Pavlov, MD PhD [facility] Endocrinology

## 2022-07-13 ENCOUNTER — Other Ambulatory Visit: Payer: Self-pay | Admitting: Internal Medicine

## 2022-07-13 DIAGNOSIS — E119 Type 2 diabetes mellitus without complications: Secondary | ICD-10-CM

## 2022-07-20 ENCOUNTER — Telehealth: Payer: Self-pay

## 2022-07-20 NOTE — Telephone Encounter (Signed)
Inbound call from DME supplier requesting form be completed and faxed with clinical notes. DME supplies ordered via [employer] through online portal. [place] [employer] - omni pod supplies

## 2022-07-22 IMAGING — DX DG CHEST 2V
2 series · 2 of 2 positions shown · non-contrast
Comparison: 02/01/2020

CLINICAL DATA: Previous CABG.  Follow-up.

EXAM:
CHEST - 2 VIEW

[dg chest 2 view (1 of 2)]
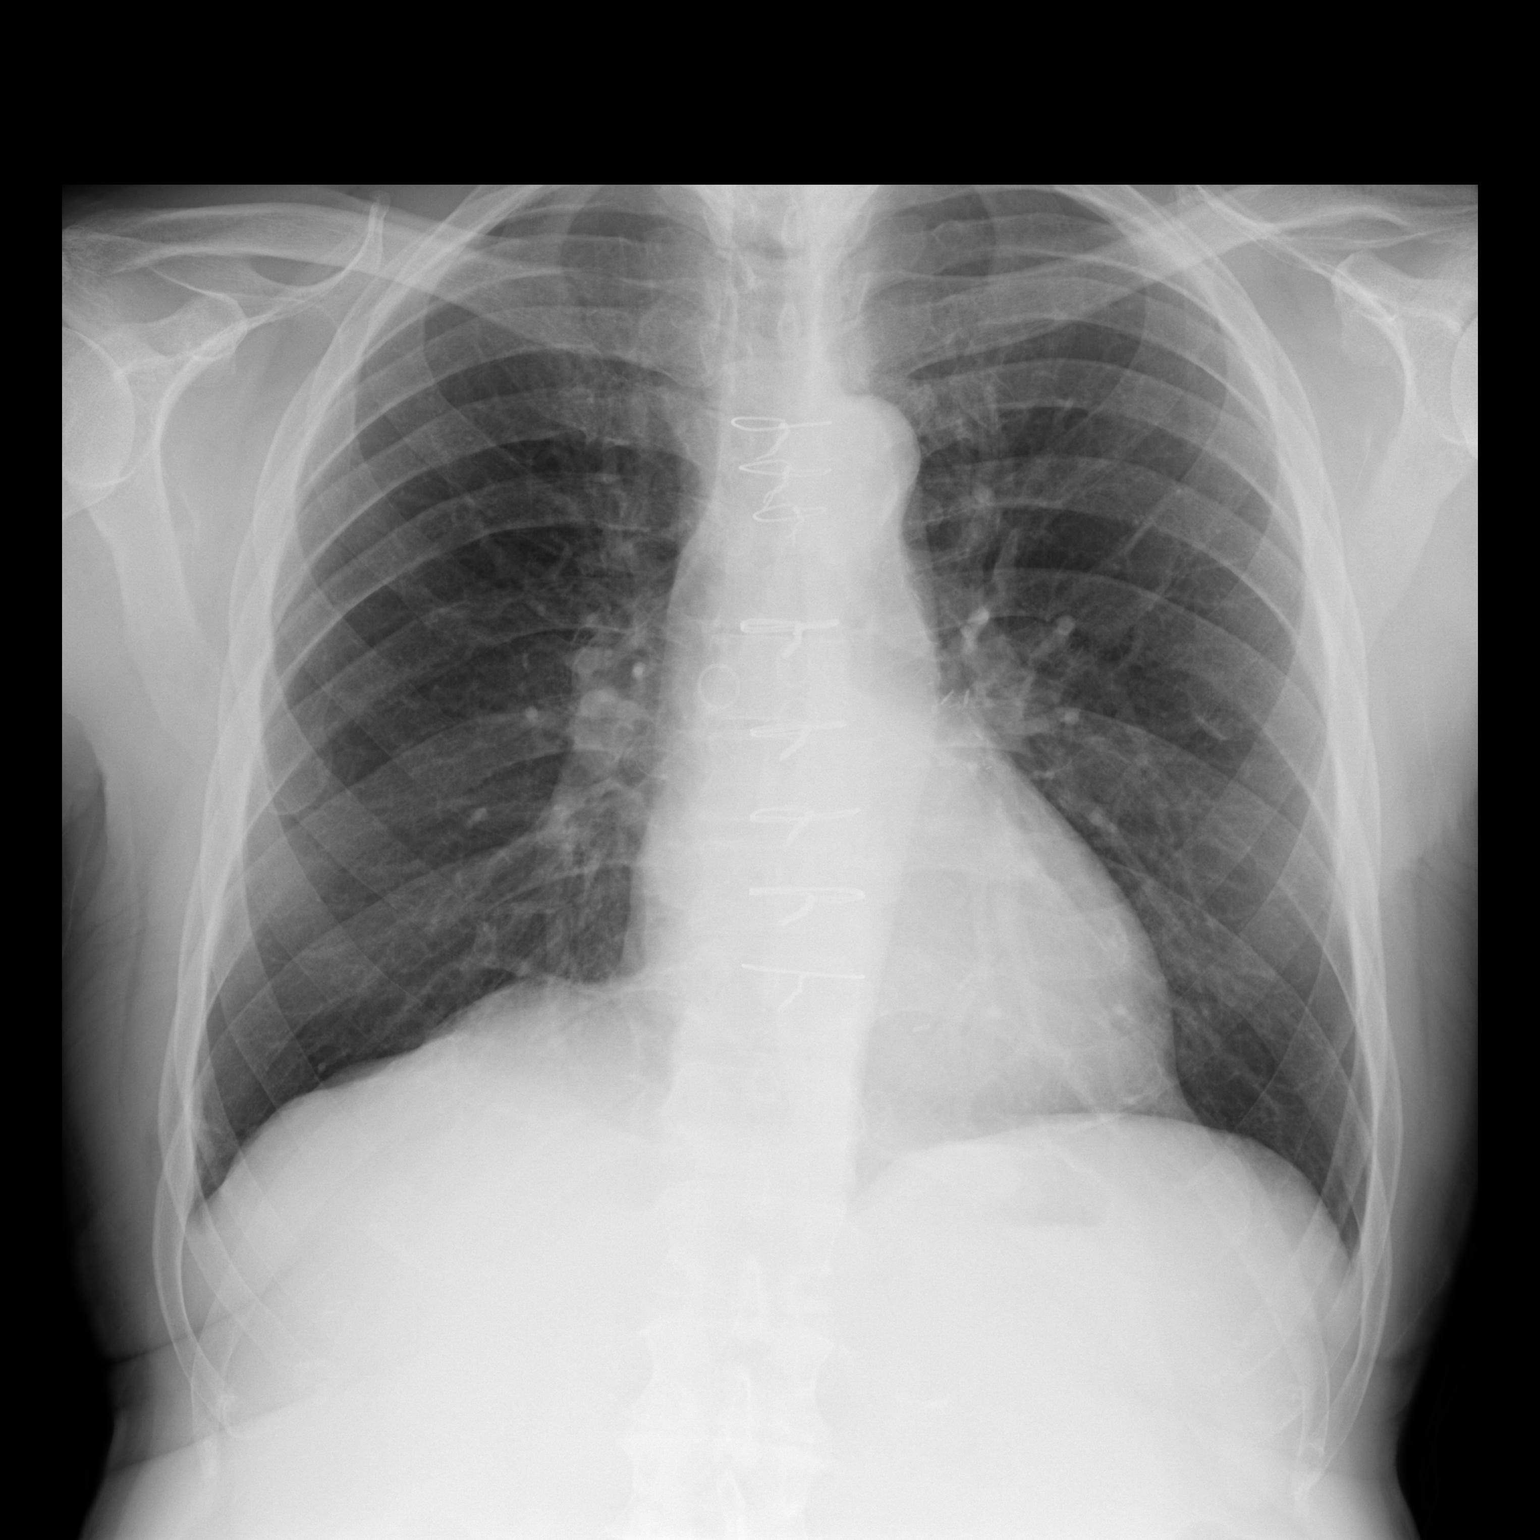

[dg chest 2 view (2 of 2)]
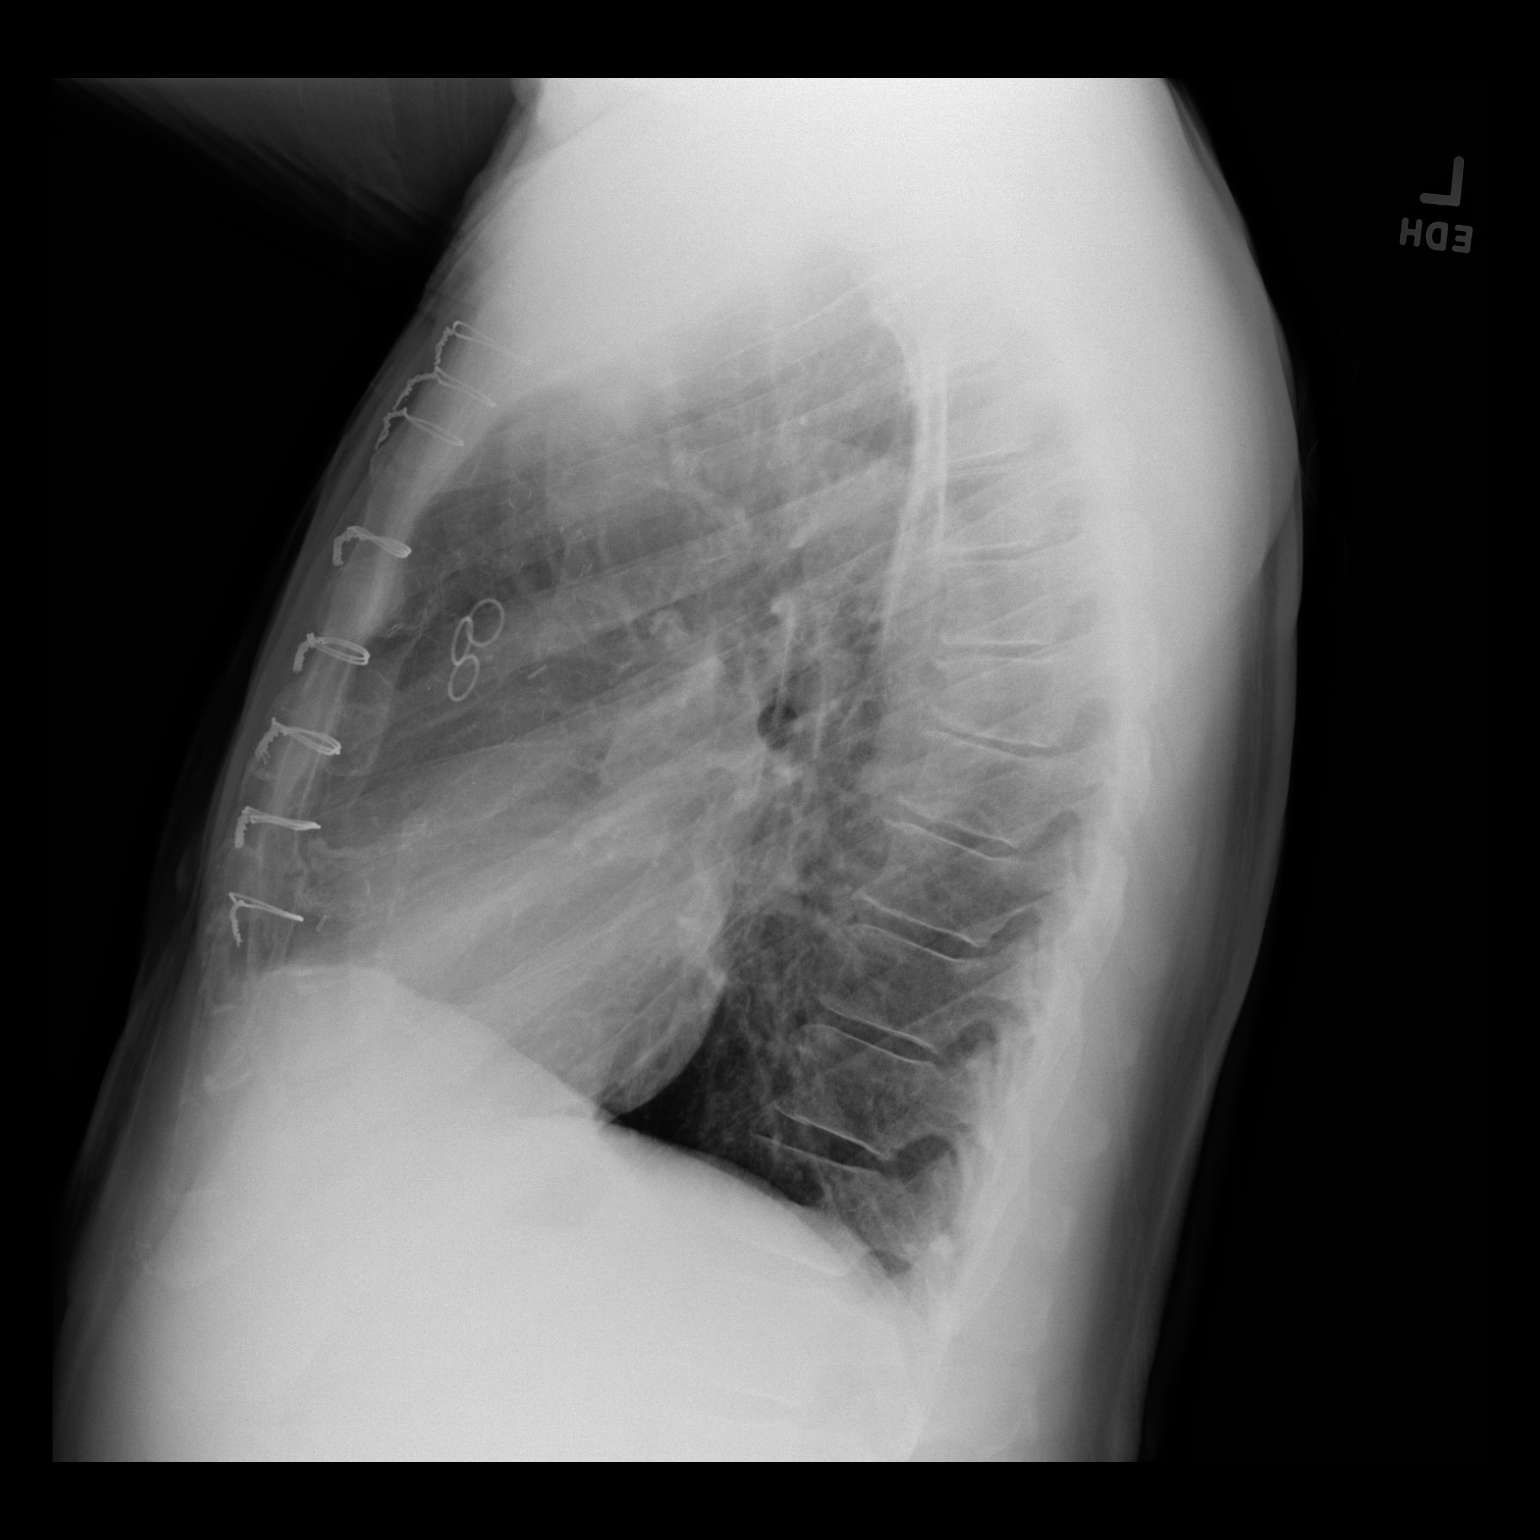

[2 of 2 positions shown; findings below may reference images not displayed]

FINDINGS: Previous median sternotomy and CABG. Heart size is normal. Ordinary
aortic atherosclerotic calcification. The lungs are clear. The
vascularity is normal. No effusions. No significant bone finding.
IMPRESSION: Previous CABG. No active disease.

## 2022-07-29 ENCOUNTER — Encounter: Payer: Self-pay | Admitting: Internal Medicine

## 2022-07-30 ENCOUNTER — Other Ambulatory Visit: Payer: Self-pay

## 2022-07-30 DIAGNOSIS — E119 Type 2 diabetes mellitus without complications: Secondary | ICD-10-CM

## 2022-07-30 MED ORDER — OMNIPOD 5 DEXG7G6 PODS GEN 5 MISC: 2 refills | Status: DC

## 2022-07-31 ENCOUNTER — Encounter: Payer: Self-pay | Admitting: Internal Medicine

## 2022-08-04 DIAGNOSIS — E119 Type 2 diabetes mellitus without complications: Secondary | ICD-10-CM | POA: Diagnosis not present

## 2022-08-04 DIAGNOSIS — H2513 Age-related nuclear cataract, bilateral: Secondary | ICD-10-CM | POA: Diagnosis not present

## 2022-08-04 DIAGNOSIS — H02831 Dermatochalasis of right upper eyelid: Secondary | ICD-10-CM | POA: Diagnosis not present

## 2022-08-04 LAB — HM DIABETES EYE EXAM

## 2022-08-05 ENCOUNTER — Other Ambulatory Visit: Payer: Self-pay | Admitting: Internal Medicine

## 2022-08-05 DIAGNOSIS — E119 Type 2 diabetes mellitus without complications: Secondary | ICD-10-CM

## 2022-08-06 ENCOUNTER — Other Ambulatory Visit: Payer: Self-pay | Admitting: Family Medicine

## 2022-08-06 ENCOUNTER — Encounter: Payer: Self-pay | Admitting: Family Medicine

## 2022-08-06 ENCOUNTER — Ambulatory Visit (INDEPENDENT_AMBULATORY_CARE_PROVIDER_SITE_OTHER): Payer: Medicare Other | Admitting: Family Medicine

## 2022-08-06 VITALS — BP 122/62 | HR 62 | Temp 97.8°F | Ht 69.0 in | Wt 173.0 lb

## 2022-08-06 DIAGNOSIS — R059 Cough, unspecified: Secondary | ICD-10-CM

## 2022-08-06 DIAGNOSIS — E119 Type 2 diabetes mellitus without complications: Secondary | ICD-10-CM

## 2022-08-06 DIAGNOSIS — Z7984 Long term (current) use of oral hypoglycemic drugs: Secondary | ICD-10-CM | POA: Diagnosis not present

## 2022-08-06 DIAGNOSIS — Z794 Long term (current) use of insulin: Secondary | ICD-10-CM

## 2022-08-06 MED ORDER — ONETOUCH ULTRA VI STRP
ORAL_STRIP | 3 refills | Status: DC
Start: 2022-08-06 — End: 2022-08-06

## 2022-08-06 MED ORDER — DOXYCYCLINE HYCLATE 100 MG PO TABS
100.0000 mg | ORAL_TABLET | Freq: Two times a day (BID) | ORAL | 0 refills | Status: DC
Start: 2022-08-06 — End: 2022-10-07

## 2022-08-06 MED ORDER — METFORMIN HCL 1000 MG PO TABS: 1500.0000 mg | ORAL_TABLET | Freq: Every day | ORAL | Status: DC

## 2022-08-06 NOTE — Progress Notes (Signed)
Cough for about 3 weeks.  Recently with discolored sputum.  No fevers.  Not SOB walking into clinic but SOB with coughing fit.  No vomiting.  No ear pain.  No ST, no rhinorrhea.  No rash.  Eating and drinking well.  No wheeze.  Delsym helped some with cough.   Meds, vitals, and allergies reviewed.   ROS: Per HPI unless specifically indicated in ROS section   GEN: nad, alert and oriented HEENT: mucous membranes moist, OP w/o erythema.  NECK: supple w/o LA CV: rrr. PULM: ctab, no inc wob, cough noted with discolored sputum. ABD: soft, +bs EXT: no edema SKIN: Well-perfused.

## 2022-08-06 NOTE — Patient Instructions (Addendum)
Sunburn caution while on doxycycline.   Update [place] as needed.  Rest and fluids.  Delsym as needed.   Take care.  Glad to see you.

## 2022-08-09 DIAGNOSIS — R059 Cough, unspecified: Secondary | ICD-10-CM | POA: Insufficient documentation

## 2022-08-09 NOTE — Assessment & Plan Note (Signed)
Okay for outpatient follow-up.  Lungs are still clear.  Start doxycycline. Sunburn caution while on doxycycline.   Update [place] as needed.  Rest and fluids.  Delsym as needed.

## 2022-08-10 NOTE — Progress Notes (Signed)
Patient has received his Tandem pump and training will be the 3rd week of June.

## 2022-08-24 ENCOUNTER — Encounter: Payer: Medicare Other | Attending: Internal Medicine | Admitting: Nutrition

## 2022-08-24 DIAGNOSIS — Z794 Long term (current) use of insulin: Secondary | ICD-10-CM | POA: Insufficient documentation

## 2022-08-24 DIAGNOSIS — E1165 Type 2 diabetes mellitus with hyperglycemia: Secondary | ICD-10-CM | POA: Insufficient documentation

## 2022-08-24 NOTE — Progress Notes (Addendum)
Patient is here with his wife and were both trained on the Tandem Control IQ insulin pump.  Settings were transferred from his OmniPod pump:  basal rate: 2.0u/hr, ISF: 70, I/C: 8, timing: 4 hours, target 120 with correction over 150.  Pt. Was shown how to set up pump, load cartridge, start/stop pump, give a bolus, give a correction dose, and change sensor type.  Handouts given for all of these topics covered.  They were told to read all of these.  He filed a cartridge with U-200 Humalog insulin and attache the Mio 6 mm infusion set to his abdomen.  Handout give for filling cartride and use the the mio infusion set as well.  His Dexcom was linked to his clarity app, and to the pump and to [place], and his pump was linked to t-connect and to [facility].  We reviewed how to give a bolus again and they had no final questions.  They will return in 3 days for review and cartridge change.

## 2022-08-26 ENCOUNTER — Encounter: Payer: Medicare Other | Admitting: Nutrition

## 2022-08-26 DIAGNOSIS — E1165 Type 2 diabetes mellitus with hyperglycemia: Secondary | ICD-10-CM

## 2022-08-27 ENCOUNTER — Other Ambulatory Visit: Payer: Self-pay | Admitting: Internal Medicine

## 2022-08-27 DIAGNOSIS — M65311 Trigger thumb, right thumb: Secondary | ICD-10-CM | POA: Diagnosis not present

## 2022-08-27 NOTE — Progress Notes (Signed)
Patient had questions about pump volume of insulin to put in cartridge, and how this pump calculates insulin.  Blood sugars variable. They were giving correction doses on the OmniPod by putting in carbs when blood sugar was high-thinking that they did not put in enough carbs in the meal count.  Reviewed need to add extra insulin for high fat meals and how to do a corrections dose by justing putting in blood sugar readings and letting the pump calculate the dose.  He did a cartridge change and we calculated that he will need to fill the cartridge by pulling down to the 175 u measurement on the syringe to provide for the 20u the pump keeps in the cartridge and when eating higher fat meals requiring more insulin.   They had no final questions.

## 2022-09-06 NOTE — Patient Instructions (Signed)
Do a correction dose by going to the bolus set up and putting in blood sugar reading only. Call Tandem help line if questions.

## 2022-09-06 NOTE — Patient Instructions (Signed)
REad over handouts given. Call Dexcom  if questions about your sensor Call Tandem if questions about your pump Return in 3 days for review

## 2022-09-16 ENCOUNTER — Other Ambulatory Visit: Payer: Self-pay | Admitting: Family Medicine

## 2022-09-17 ENCOUNTER — Other Ambulatory Visit: Payer: Self-pay | Admitting: Cardiovascular Disease

## 2022-09-25 ENCOUNTER — Ambulatory Visit (INDEPENDENT_AMBULATORY_CARE_PROVIDER_SITE_OTHER): Payer: Medicare Other | Admitting: Internal Medicine

## 2022-09-25 ENCOUNTER — Encounter: Payer: Self-pay | Admitting: Internal Medicine

## 2022-09-25 ENCOUNTER — Telehealth: Payer: Self-pay

## 2022-09-25 VITALS — BP 136/84 | HR 69 | Ht 69.0 in | Wt 180.4 lb

## 2022-09-25 DIAGNOSIS — Z794 Long term (current) use of insulin: Secondary | ICD-10-CM

## 2022-09-25 DIAGNOSIS — E119 Type 2 diabetes mellitus without complications: Secondary | ICD-10-CM | POA: Diagnosis not present

## 2022-09-25 DIAGNOSIS — E785 Hyperlipidemia, unspecified: Secondary | ICD-10-CM

## 2022-09-25 DIAGNOSIS — Z7984 Long term (current) use of oral hypoglycemic drugs: Secondary | ICD-10-CM

## 2022-09-25 LAB — POCT GLYCOSYLATED HEMOGLOBIN (HGB A1C): Hemoglobin A1C: 6.2 % — AB (ref 4.0–5.6)

## 2022-09-25 NOTE — Patient Instructions (Addendum)
Please continue: - Farxiga 10 mg before b'fast - Metformin 1500 mg with dinner  Use the following pump settings: - Basal rates: 12 am: 2 units/h - Insulin to carb ratio: 12 am: 1:8 6 am: 8 >> 7 - Target: 12 am: 110 - Correction factor (insulin sensitivity factor):  12 am: 70  Try to do dual wave (extended) boluses for fattier meals: start with 70-30% and may need to change this.  Please return in 3-4 months.

## 2022-09-25 NOTE — Telephone Encounter (Signed)
Order placed Via [employer]  DME Company: [employer]  Dexcom G7 and Receiver

## 2022-09-25 NOTE — Progress Notes (Signed)
Patient ID: Luis Wise, male   DOB: [birth year], 82 y.o.   MRN: [id number]   HPI: Luis Wise is a 82 y.o.-year-old male, initially referred by his PCP, Dr. Para March, returning for follow-up: DM2, dx in 2004-2005, insulin-dependent, uncontrolled, with complications (CAD - s/p CABG x5, CKD).  He is here with his wife, who is a former Nurse, learning disability and offers part of the history especially related to medications, blood sugars, and diet.  Last visit 3 months ago.  Interim history: No increased urination, blurry vision, nausea, chest pain. They went to [place] since last OV  - had a great visit.  Insulin pump: - Omnipod 5 >> $$  - now t:slim X2 - cheaper for him as it is considered durable medical equipment as opposed to the OmniPod pump which goes to his prescription benefits - started mid 08/2022  CGM: - Dexcom G6  Insulin: - Humalog U200 now  He had difficulty obtaining the t:slim X2 insulin pump due to lack of insulin deficiency.  However, his anti-insulin antibodies were elevated: Component     Latest Ref Rng 05/25/2022 06/12/2022  Glutamic Acid Decarb Ab     <5 IU/mL  <5   IA-2 Antibody     <5.4 U/mL  <5.4   Insulin Antibodies, Human     <0.4 U/mL  3.2 (H)   Glucose     65 - 99 mg/dL 831 (H)    C-Peptide     0.80 - 3.85 ng/mL 1.49      Reviewed HbA1c levels: Lab Results  Component Value Date   HGBA1C 6.1 (A) 07/02/2022   HGBA1C 5.9 (A) 03/11/2022   HGBA1C 6.4 01/08/2022   HGBA1C 6.2 (A) 11/06/2021   HGBA1C 6.5 (A) 08/07/2021   HGBA1C 7.5 (A) 04/03/2021   HGBA1C 7.7 (H) 01/08/2021   HGBA1C 7.2 (A) 11/27/2020   HGBA1C 7.5 (A) 08/29/2020   HGBA1C 8.4 (A) 07/01/2020   Patient was on a regimen of: - Metformin 500 mg in am and 1000 mg in pm >> 1500 mg at dinnertime.  Mom - Marcelline Deist 5 >> 10 mg before breakfast-added 03/2020 - Glimepiride 4 mg in am - NovoLog (16)-(16)-(14-16) before B-L-D - Tresiba 10 >> 26 >> 30 >> 32 >> 30 units daily-added 06/2020 >> now 24 units She was  also tried on Rybelsus but this was expensive.  He then tried Ozempic but he developed nausea and vomiting and then increase lipase on 02/27/2020 (82  >> 119) on the 0.5 mg dose.  Ozempic was stopped at that time. Prev. On Januvia >> tolerated well but expensive.  Currently on: - Farxiga 10 mg before b'fast - Metformin 1500 mg with dinner  - Insulin pump: - Basal rates: 12 am: 2 units/h  - Insulin to carb ratio: 12 am: 1:8 - Target: 12 am: 120-150 >> 110 - Correction factor (insulin sensitivity factor):  12 am: 70 - Changes infusion site: q3 days  Total daily dose from basal insulin: 24 units >> 22.6 units (44%) >> 41% Total daily dose from bolus insulin: 25-36 units >> 28.5 units (56%) >> 59% TDD: 78-100 units  Pt checks his sugars >4x a day:   Prev.:  Prev.:   Lowest sugar was 70 >> 70s >> 60; he has hypoglycemia awareness in the 60s.  Highest sugar was 250 >> 300 (chinese foods) >> 300.  Glucometer: OneTouch ultra  Pt's meals are: - Breakfast: 1 egg + toast or cereal or oatmeal - Lunch: grilled fish +  veggies - Dinner: meat + veggies - Snacks: 2: nuts, wheat crackers He walks 3 miles 3 days a week for exercise.  He finished cardiac rehab.   -No CKD, last BUN/creatinine:  Lab Results  Component Value Date   BUN 18 01/08/2022   BUN 27 (H) 01/08/2021   CREATININE 1.13 01/08/2022   CREATININE 1.30 01/08/2021  On lisinopril 20 twice a day.  -+ HL; last set of lipids: Lab Results  Component Value Date   CHOL 83 01/08/2022   HDL 23.00 (L) 01/08/2022   LDLCALC 34 01/08/2022   LDLDIRECT 35.0 01/08/2021   TRIG 127.0 01/08/2022   CHOLHDL 4 01/08/2022  On atorvastatin 80 mg daily, fenofibrate 160, co-Q10  On ASA 81.  - last eye exam was 08/04/2022: No DR.  - no numbness and tingling in his feet.  Last foot exam 01/15/2022.  Pt has FH of DM in father in his 10s.  He was taken off amiodarone.    ROS: + See HPI  I reviewed pt's medications, allergies,  PMH, social hx, family hx, and changes were documented in the history of present illness. Otherwise, unchanged from my initial visit note.  Past Medical History:  Diagnosis Date   Allergy    Amoxicillin   Cataract 2017   Mild progression   Coronary artery disease    a. s/p 5-vessel CABG (LIMA-LAD, SVG-diag, sequential SVG-OM1-OM 2, & SVG-PDA)   Diabetes mellitus with complication (HCC)    Hyperlipidemia    Hypertension    Insomnia    neuropathy    bilateral feet   Postoperative atrial fibrillation [facility])    Past Surgical History:  Procedure Laterality Date   BACK SURGERY     CARDIAC CATHETERIZATION     01/19/2020   COLONOSCOPY WITH PROPOFOL N/A 04/01/2021   Procedure: COLONOSCOPY WITH PROPOFOL;  Surgeon: Midge Minium, MD;  Location: [facility] ENDOSCOPY;  Service: Endoscopy;  Laterality: N/A;   CORONARY ARTERY BYPASS GRAFT N/A 01/29/2020   Procedure: CORONARY ARTERY BYPASS GRAFTING (CABG) TIMES FIVE USING LIMA to LAD; ENDOSCOPIC HARVESTED GREATER SAPHENOUS VEIN: SVG to PDA; SVG to sequenced OM1 & OM2.;  Surgeon: Kerin Perna, MD;  Location: [facility] OR;  Service: Open Heart Surgery;  Laterality: N/A;   ENDOVEIN HARVEST OF GREATER SAPHENOUS VEIN Bilateral 01/29/2020   Procedure: ENDOVEIN HARVEST OF GREATER SAPHENOUS VEIN;  Surgeon: Kerin Perna, MD;  Location: [facility] OR;  Service: Open Heart Surgery;  Laterality: Bilateral;   EYE SURGERY  Radial K   1997   LAMINECTOMY  1993   L5-S1   LEFT HEART CATH AND CORONARY ANGIOGRAPHY N/A 01/19/2020   Procedure: LEFT HEART CATH AND CORONARY ANGIOGRAPHY;  Surgeon: Iran Ouch, MD;  Location: [facility] INVASIVE CV LAB;  Service: Cardiovascular;  Laterality: N/A;   MENISCUS REPAIR  1996   TEE WITHOUT CARDIOVERSION N/A 01/29/2020   Procedure: TRANSESOPHAGEAL ECHOCARDIOGRAM (TEE);  Surgeon: Donata Clay, Theron Arista, MD;  Location: [facility] OR;  Service: Open Heart Surgery;  Laterality: N/A;   TONSILLECTOMY     Social History   Socioeconomic History   Marital  status: Married    Spouse name: Not on file   Number of children: Not on file   Years of education: Not on file   Highest education level: Professional school degree (e.g., MD, DDS, DVM, JD)  Occupational History   Occupation: CFO  Tobacco Use   Smoking status: Never   Smokeless tobacco: Never  Vaping Use   Vaping status: Never Used  Substance and  Sexual Activity   Alcohol use: Not Currently    Alcohol/week: 0.0 standard drinks of alcohol    Comment: Very rare- occasionaly beer or wine    Drug use: No   Sexual activity: Yes    Birth control/protection: None  Other Topics Concern   Not on file  Social History Narrative   Married 1970.   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   Social Determinants of Health   Financial Resource Strain: Low Risk  (08/05/2022)   Overall Financial Resource Strain (CARDIA)    Difficulty of Paying Living Expenses: Not hard at all  Food Insecurity: No Food Insecurity (08/05/2022)   Hunger Vital Sign    Worried About Running Out of Food in the Last Year: Never true    Ran Out of Food in the Last Year: Never true  Transportation Needs: No Transportation Needs (08/05/2022)   PRAPARE - Administrator, Civil Service (Medical): No    Lack of Transportation (Non-Medical): No  Physical Activity: Sufficiently Active (08/05/2022)   Exercise Vital Sign    Days of Exercise per Week: 3 days    Minutes of Exercise per Session: 60 min  Stress: No Stress Concern Present (08/05/2022)   [employer] of Occupational Health - Occupational Stress Questionnaire    Feeling of Stress : Not at all  Social Connections: Socially Integrated (08/05/2022)   Social Connection and Isolation Panel [NHANES]    Frequency of Communication with Friends and Family: More than three times a week    Frequency of Social  Gatherings with Friends and Family: Three times a week    Attends Religious Services: More than 4 times per year    Active Member of Clubs or Organizations: Yes    Attends Banker Meetings: Patient declined    Marital Status: Married  Catering manager Violence: Not At Risk (12/22/2021)   Humiliation, Afraid, Rape, and Kick questionnaire    Fear of Current or Ex-Partner: No    Emotionally Abused: No    Physically Abused: No    Sexually Abused: No   Current Outpatient Medications on File Prior to Visit  Medication Sig Dispense Refill   amLODipine (NORVASC) 10 MG tablet TAKE 1 TABLET BY MOUTH EVERY DAY 90 tablet 3   atorvastatin (LIPITOR) 80 MG tablet TAKE 1 TABLET BY MOUTH EVERY DAY 90 tablet 2   carvedilol (COREG) 12.5 MG tablet TAKE 1 TABLET BY MOUTH 2 TIMES DAILY. 180 tablet 2   cholecalciferol (VITAMIN D3) 25 MCG (1000 UNIT) tablet Take 1,000 Units by mouth daily.     Coenzyme Q10 (COQ10) 100 MG CAPS Take 100 mg by mouth daily.      Continuous Blood Gluc Transmit (DEXCOM G6 TRANSMITTER) MISC 1 Device by Does not apply route every 3 (three) months. E11.65 1 each 3   dapagliflozin propanediol (FARXIGA) 10 MG TABS tablet TAKE 1 TABLET BY MOUTH DAILY BEFORE BREAKFAST. 90 tablet 1   doxycycline (VIBRA-TABS) 100 MG tablet Take 1 tablet (100 mg total) by mouth 2 (two) times daily. 14 tablet 0   fenofibrate 160 MG tablet TAKE 1 TABLET BY MOUTH EVERY DAY 90 tablet 1   glucose blood (ONETOUCH ULTRA) test strip Use to check blood sugar daily. Dx E11.9 200 strip 3   GVOKE HYPOPEN 1-PACK 1 MG/0.2ML SOAJ Inject 0.2 mg under skin as needed  for low blood sugars 0.2 mL prn   Insulin Disposable Pump (OMNIPOD 5 G6 PODS, GEN 5,) MISC USE AS INSTRUCTED TO ADMINISTER INSULIN 30 each 2   insulin lispro (HUMALOG KWIKPEN) 200 UNIT/ML KwikPen Use as instructed in the insulin pump max daily 145 units 69 mL 3   Insulin Pen Needle 32G X 4 MM MISC Use 4x a day 300 each 3   metFORMIN (GLUCOPHAGE) 1000 MG  tablet Take 1.5 tablets (1,500 mg total) by mouth daily. At dinner.     Multiple Vitamins-Minerals (PRESERVISION AREDS 2 PO) Take 1 tablet by mouth 2 (two) times daily.      Omega-3 Fatty Acids (FISH OIL) 1000 MG CAPS Take 1-2 capsules (1,000-2,000 mg total) by mouth in the morning and at bedtime.  0   sildenafil (VIAGRA) 100 MG tablet Take 1 tablet (100 mg total) by mouth as needed for erectile dysfunction. 30 tablet 6   traZODone (DESYREL) 100 MG tablet TAKE 1 TABLET BY MOUTH EVERYDAY AT BEDTIME 90 tablet 3   valsartan (DIOVAN) 160 MG tablet Take 1 tablet (160 mg total) by mouth daily. 90 tablet 3   Zinc Oxide 15 MG TBDP 2 tabs per day     No current facility-administered medications on file prior to visit.   Allergies  Allergen Reactions   Ozempic (0.25 Or 0.5 Mg-Dose) [Semaglutide(0.25 Or 0.5mg -Dos)]     Presumed cause of GI upset.     Amoxil [Amoxicillin] Rash   Family History  Problem Relation Age of Onset   Hypertension Mother    Diabetes Father    Cancer Neg Hx    COPD Neg Hx    Heart disease Neg Hx    Hyperlipidemia Neg Hx    Stroke Neg Hx    Colon cancer Neg Hx    Prostate cancer Neg Hx    PE: There were no vitals taken for this visit. Wt Readings from Last 3 Encounters:  08/06/22 173 lb (78.5 kg)  07/02/22 183 lb (83 kg)  06/30/22 180 lb (81.6 kg)   Constitutional: normal weight, in NAD Eyes: EOMI, no exophthalmos ENT: no thyromegaly, no cervical lymphadenopathy Cardiovascular: RRR, No MRG Respiratory: CTA B Musculoskeletal: no deformities Skin:  no rashes Neurological: no tremor with outstretched hands  ASSESSMENT: 1. DM, insulin-dependent, uncontrolled, with complications - CAD, s/p CABG x5 - CKD  Component     Latest Ref Rng & Units 08/29/2020          Glucose     65 - 99 mg/dL 161 (H)  Hemoglobin W9U     4.0 - 5.6 % 7.5 (A)  C-Peptide     0.80 - 3.85 ng/mL 4.30 (H)  Islet Cell Ab     Neg:<1:1 Negative  ZNT8 Antibodies     <15 U/mL <10   Glutamic Acid Decarb Ab     <5 IU/mL <5  No insulin deficiency or pancreatic autoimmunity.  He has good insulin production.  Of note, we cannot use a GLP-1 receptor agonist for him due to previously elevated lipase.  2. HL  PLAN:  1. Patient with longstanding, fairly well controlled DM insulin dependent, on insulin pump therapy: prev. Omnipod 5, now t:slim X2 pump (changed 2/2 $$ in 08/2022) integrated with the Dexcom CGM. HbA1c was slightly higher at last check, but still excellent, at 6.1%. Sugars were at goal overnight but increasing after b'fast and especially after dinner, possibly related to his travelling. I did not suggest pump setting changes but discussed that  he may need to enter more cabs into the pump or strengthen the ICRs if the pattern continues.We continued Metformin. CGM interpretation: -At today's visit, we reviewed his CGM downloads: It appears that 78% of values are in target range (goal >70%), while 22% are higher than 180 (goal <25%), and 0% are lower than 70 (goal <4%).  The calculated average blood sugar is 149.  The projected HbA1c for the next 3 months (GMI) is 6.9%. -Reviewing the CGM trends, sugars are higher than before, with more significant hyperglycemic spikes after lunch and dinner. We discussed about the need for dual wave boluses for certain meals (high in fat). Explained how to set up and do these boluses. Also, will strengthen the ICR before L and D. The rest of the settings appear to be adequate. - I also suggested to change to the Dexcom G7 CGM - sent Rx to [facility] - I suggested to:  Patient Instructions  Please continue: - Farxiga 10 mg before b'fast - Metformin 1500 mg with dinner  Use the following pump settings: - Basal rates: 12 am: 2 units/h - Insulin to carb ratio: 12 am: 1:8 6 am: 8 >> 7 - Target: 12 am: 110 - Correction factor (insulin sensitivity factor):  12 am: 70  Try to do dual wave (extended) boluses for fattier meals: start with  70-30% and may need to change this.  Please return in 3-4 months.  - we checked his HbA1c: 6.2% (slightly higher) - advised to check sugars at different times of the day - 4x a day, rotating check times - advised for yearly eye exams >> he is UTD - return to clinic in 3-4 months  2. HL -reviewed latest lipid panel: fractions at goal except low HDL: Lab Results  Component Value Date   CHOL 83 01/08/2022   HDL 23.00 (L) 01/08/2022   LDLCALC 34 01/08/2022   LDLDIRECT 35.0 01/08/2021   TRIG 127.0 01/08/2022   CHOLHDL 4 01/08/2022  -he continues Lipitor 80 mg daily and fenofibrate 160 mg daily w/o SEs  Carlus Pavlov, MD PhD [facility] Endocrinology

## 2022-09-28 NOTE — Addendum Note (Signed)
Addended by: Pollie Meyer on: 09/28/2022 09:36 AM   Modules accepted: Orders

## 2022-10-07 ENCOUNTER — Telehealth: Payer: Medicare Other | Admitting: Family

## 2022-10-07 DIAGNOSIS — J019 Acute sinusitis, unspecified: Secondary | ICD-10-CM | POA: Diagnosis not present

## 2022-10-07 MED ORDER — DOXYCYCLINE HYCLATE 100 MG PO TABS: 100.0000 mg | ORAL_TABLET | Freq: Two times a day (BID) | ORAL | 0 refills | Status: DC

## 2022-10-07 NOTE — Progress Notes (Signed)
E-Visit for Sinus Problems  We are sorry that you are not feeling well.  Here is how we plan to help!  Based on what you have shared with me it looks like you have sinusitis.  Sinusitis is inflammation and infection in the sinus cavities of the head.  Based on your presentation I believe you most likely have Acute Bacterial Sinusitis.  This is an infection caused by bacteria and is treated with antibiotics. I have prescribed Doxycycline 100mg by mouth twice a day for 10 days. You may use an oral decongestant such as Mucinex D or if you have glaucoma or high blood pressure use plain Mucinex. Saline nasal spray help and can safely be used as often as needed for congestion.  If you develop worsening sinus pain, fever or notice severe headache and vision changes, or if symptoms are not better after completion of antibiotic, please schedule an appointment with a health care provider.    Sinus infections are not as easily transmitted as other respiratory infection, however we still recommend that you avoid close contact with loved ones, especially the very young and elderly.  Remember to wash your hands thoroughly throughout the day as this is the number one way to prevent the spread of infection!  Home Care: Only take medications as instructed by your medical team. Complete the entire course of an antibiotic. Do not take these medications with alcohol. A steam or ultrasonic humidifier can help congestion.  You can place a towel over your head and breathe in the steam from hot water coming from a faucet. Avoid close contacts especially the very young and the elderly. Cover your mouth when you cough or sneeze. Always remember to wash your hands.  Get Help Right Away If: You develop worsening fever or sinus pain. You develop a severe head ache or visual changes. Your symptoms persist after you have completed your treatment plan.  Make sure you Understand these instructions. Will watch your  condition. Will get help right away if you are not doing well or get worse.  Thank you for choosing an e-visit.  Your e-visit answers were reviewed by a board certified advanced clinical practitioner to complete your personal care plan. Depending upon the condition, your plan could have included both over the counter or prescription medications.  Please review your pharmacy choice. Make sure the pharmacy is open so you can pick up prescription now. If there is a problem, you may contact your provider through MyChart messaging and have the prescription routed to another pharmacy.  Your safety is important to us. If you have drug allergies check your prescription carefully.   For the next 24 hours you can use MyChart to ask questions about today's visit, request a non-urgent call back, or ask for a work or school excuse. You will get an email in the next two days asking about your experience. I hope that your e-visit has been valuable and will speed your recovery.   Approximately 5 minutes was spent documenting and reviewing patient's chart.   

## 2022-10-16 ENCOUNTER — Ambulatory Visit: Payer: Medicare Other | Admitting: Family Medicine

## 2022-10-16 ENCOUNTER — Encounter: Payer: Self-pay | Admitting: Internal Medicine

## 2022-10-19 NOTE — Telephone Encounter (Signed)
Please Review patients pump. I have printed the download and placed it on your desk.

## 2022-11-12 ENCOUNTER — Ambulatory Visit: Payer: Medicare Other | Admitting: Internal Medicine

## 2022-11-27 DIAGNOSIS — Z23 Encounter for immunization: Secondary | ICD-10-CM | POA: Diagnosis not present

## 2022-12-08 ENCOUNTER — Other Ambulatory Visit: Payer: Self-pay | Admitting: Family Medicine

## 2022-12-16 ENCOUNTER — Other Ambulatory Visit: Payer: Self-pay | Admitting: Internal Medicine

## 2023-01-28 ENCOUNTER — Encounter: Payer: Self-pay | Admitting: Internal Medicine

## 2023-01-28 ENCOUNTER — Ambulatory Visit (INDEPENDENT_AMBULATORY_CARE_PROVIDER_SITE_OTHER): Payer: Medicare Other | Admitting: Internal Medicine

## 2023-01-28 VITALS — BP 136/70 | HR 67 | Ht 69.0 in | Wt 184.4 lb

## 2023-01-28 DIAGNOSIS — E785 Hyperlipidemia, unspecified: Secondary | ICD-10-CM

## 2023-01-28 DIAGNOSIS — Z7984 Long term (current) use of oral hypoglycemic drugs: Secondary | ICD-10-CM

## 2023-01-28 DIAGNOSIS — Z794 Long term (current) use of insulin: Secondary | ICD-10-CM | POA: Diagnosis not present

## 2023-01-28 DIAGNOSIS — E1159 Type 2 diabetes mellitus with other circulatory complications: Secondary | ICD-10-CM

## 2023-01-28 DIAGNOSIS — E1165 Type 2 diabetes mellitus with hyperglycemia: Secondary | ICD-10-CM | POA: Diagnosis not present

## 2023-01-28 LAB — POCT GLYCOSYLATED HEMOGLOBIN (HGB A1C): Hemoglobin A1C: 5.8 % — AB (ref 4.0–5.6)

## 2023-01-28 LAB — MICROALBUMIN / CREATININE URINE RATIO
Creatinine,U: 100.8 mg/dL
Microalb Creat Ratio: 14 mg/g (ref 0.0–30.0)
Microalb, Ur: 14.1 mg/dL — ABNORMAL HIGH (ref 0.0–1.9)

## 2023-01-28 NOTE — Progress Notes (Signed)
Patient ID: [name], male   DOB: [birth year], 82 y.o.   MRN: [id number]   HPI: Luis Wise is a 82 y.o.-year-old male, initially referred by his PCP, Dr. Para March, returning for follow-up: DM2, dx in 2004-2005, insulin-dependent, uncontrolled, with complications (CAD - s/p CABG x5, CKD).  Last visit 4 months ago.  He is here with his wife, who is a former Nurse, learning disability and offers part of the history especially related to his medication regimen, blood sugars, and activity.  Interim history: No increased urination, blurry vision, nausea, chest pain.  Insulin pump: - Omnipod 5 >> $$  - now t:slim X2 - cheaper for him as it is considered durable medical equipment as opposed to the OmniPod pump which goes to his prescription benefits - started mid 08/2022  CGM: - Dexcom G6 >> G7  Insulin: - Humalog U200 now   Reviewed HbA1c levels: Lab Results  Component Value Date   HGBA1C 6.2 (A) 09/25/2022   HGBA1C 6.1 (A) 07/02/2022   HGBA1C 5.9 (A) 03/11/2022   HGBA1C 6.4 01/08/2022   HGBA1C 6.2 (A) 11/06/2021   HGBA1C 6.5 (A) 08/07/2021   HGBA1C 7.5 (A) 04/03/2021   HGBA1C 7.7 (H) 01/08/2021   HGBA1C 7.2 (A) 11/27/2020   HGBA1C 7.5 (A) 08/29/2020   Patient was on a regimen of: - Metformin 500 mg in am and 1000 mg in pm >> 1500 mg at dinnertime.  Mom - Luis Wise 5 >> 10 mg before breakfast-added 03/2020 - Glimepiride 4 mg in am - NovoLog (16)-(16)-(14-16) before B-L-D - Tresiba 10 >> 26 >> 30 >> 32 >> 30 units daily-added 06/2020 >> now 24 units He was also tried on Rybelsus but this was expensive.  He then tried Ozempic but he developed nausea and vomiting and then increase lipase on 02/27/2020 (82  >> 119) on the 0.5 mg dose.  Ozempic was stopped at that time. Prev. On Januvia >> tolerated well but expensive.  Currently on: - Farxiga 10 mg before b'fast - Metformin 1500 mg with dinner  - Insulin pump: - Basal rates: 12 am: 2 units/h - Insulin to carb ratio: 12 am: 1:8 6 am: 8 >> 7,  but in 10/2022 changed ICR with dinner to 1: 8, then 9 - Target: 12 am: 110 - Correction factor (insulin sensitivity factor):  12 am: 70  Total daily dose from basal insulin: 24 units >> 22.6 units (44%) >> 41% >> 63% Total daily dose from bolus insulin: 25-36 units >> 28.5 units (56%) >> 37% TDD: 63-100 units  Pt checks his sugars >4x a day:  Previously:   Prev.:  Lowest sugar was 70 >> 70s >> 60 >> 63; he has hypoglycemia awareness in the 60s.  Highest sugar was 250 >> 300 (chinese foods) >> 300 >> 200s  Glucometer: OneTouch ultra  Pt's meals are: - Breakfast: 1 egg + toast or cereal or oatmeal - Lunch: grilled fish + veggies - Dinner: meat + veggies - Snacks: 2: nuts, wheat crackers He walks 3 miles 2-3 days a week for exercise.  He finished cardiac rehab.   -No CKD, last BUN/creatinine:  Lab Results  Component Value Date   BUN 18 01/08/2022   BUN 27 (H) 01/08/2021   CREATININE 1.13 01/08/2022   CREATININE 1.30 01/08/2021   Lab Results  Component Value Date   MICRALBCREAT <30 06/24/2018  On lisinopril 20 twice a day.  -+ HL; last set of lipids: Lab Results  Component Value Date   CHOL  83 01/08/2022   HDL 23.00 (L) 01/08/2022   LDLCALC 34 01/08/2022   LDLDIRECT 35.0 01/08/2021   TRIG 127.0 01/08/2022   CHOLHDL 4 01/08/2022  On atorvastatin 80 mg daily, fenofibrate 160, co-Q10  On ASA 81.  - last eye exam was 08/04/2022: No DR.  - no numbness and tingling in his feet.  Last foot exam 01/15/2022.  Pt has FH of DM in father in his 43s.  He was taken off amiodarone.    ROS: + See HPI  I reviewed pt's medications, allergies, PMH, social hx, family hx, and changes were documented in the history of present illness. Otherwise, unchanged from my initial visit note.  Past Medical History:  Diagnosis Date   Allergy    Amoxicillin   Cataract 2017   Mild progression   Coronary artery disease    a. s/p 5-vessel CABG (LIMA-LAD, SVG-diag, sequential  SVG-OM1-OM 2, & SVG-PDA)   Diabetes mellitus with complication (HCC)    Hyperlipidemia    Hypertension    Insomnia    neuropathy    bilateral feet   Postoperative atrial fibrillation [facility])    Past Surgical History:  Procedure Laterality Date   BACK SURGERY     CARDIAC CATHETERIZATION     01/19/2020   COLONOSCOPY WITH PROPOFOL N/A 04/01/2021   Procedure: COLONOSCOPY WITH PROPOFOL;  Surgeon: Midge Minium, MD;  Location: [facility] ENDOSCOPY;  Service: Endoscopy;  Laterality: N/A;   CORONARY ARTERY BYPASS GRAFT N/A 01/29/2020   Procedure: CORONARY ARTERY BYPASS GRAFTING (CABG) TIMES FIVE USING LIMA to LAD; ENDOSCOPIC HARVESTED GREATER SAPHENOUS VEIN: SVG to PDA; SVG to sequenced OM1 & OM2.;  Surgeon: Kerin Perna, MD;  Location: [facility] OR;  Service: Open Heart Surgery;  Laterality: N/A;   ENDOVEIN HARVEST OF GREATER SAPHENOUS VEIN Bilateral 01/29/2020   Procedure: ENDOVEIN HARVEST OF GREATER SAPHENOUS VEIN;  Surgeon: Kerin Perna, MD;  Location: [facility] OR;  Service: Open Heart Surgery;  Laterality: Bilateral;   EYE SURGERY  Radial K   1997   LAMINECTOMY  1993   L5-S1   LEFT HEART CATH AND CORONARY ANGIOGRAPHY N/A 01/19/2020   Procedure: LEFT HEART CATH AND CORONARY ANGIOGRAPHY;  Surgeon: Iran Ouch, MD;  Location: [facility] INVASIVE CV LAB;  Service: Cardiovascular;  Laterality: N/A;   MENISCUS REPAIR  1996   TEE WITHOUT CARDIOVERSION N/A 01/29/2020   Procedure: TRANSESOPHAGEAL ECHOCARDIOGRAM (TEE);  Surgeon: Donata Clay, Theron Arista, MD;  Location: [facility] OR;  Service: Open Heart Surgery;  Laterality: N/A;   TONSILLECTOMY     Social History   Socioeconomic History   Marital status: Married    Spouse name: Not on file   Number of children: Not on file   Years of education: Not on file   Highest education level: Professional school degree (e.g., MD, DDS, DVM, JD)  Occupational History   Occupation: CFO  Tobacco Use   Smoking status: Never   Smokeless tobacco: Never  Vaping Use   Vaping status:  Never Used  Substance and Sexual Activity   Alcohol use: Not Currently    Alcohol/week: 0.0 standard drinks of alcohol    Comment: Very rare- occasionaly beer or wine    Drug use: No   Sexual activity: Yes    Birth control/protection: None  Other Topics Concern   Not on file  Social History Narrative   Married 1970.   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   Social Determinants of Health   Financial Resource Strain: Low Risk  (08/05/2022)   Overall Financial Resource Strain (CARDIA)    Difficulty of Paying Living Expenses: Not hard at all  Food Insecurity: No Food Insecurity (08/05/2022)   Hunger Vital Sign    Worried About Running Out of Food in the Last Year: Never true    Ran Out of Food in the Last Year: Never true  Transportation Needs: No Transportation Needs (08/05/2022)   PRAPARE - Administrator, Civil Service (Medical): No    Lack of Transportation (Non-Medical): No  Physical Activity: Sufficiently Active (08/05/2022)   Exercise Vital Sign    Days of Exercise per Week: 3 days    Minutes of Exercise per Session: 60 min  Stress: No Stress Concern Present (08/05/2022)   [employer] of Occupational Health - Occupational Stress Questionnaire    Feeling of Stress : Not at all  Social Connections: Socially Integrated (08/05/2022)   Social Connection and Isolation Panel [NHANES]    Frequency of Communication with Friends and Family: More than three times a week    Frequency of Social Gatherings with Friends and Family: Three times a week    Attends Religious Services: More than 4 times per year    Active Member of Clubs or Organizations: Yes    Attends Banker Meetings: Patient declined    Marital Status: Married  Catering manager Violence: Not At Risk (12/22/2021)   Humiliation, Afraid, Rape, and  Kick questionnaire    Fear of Current or Ex-Partner: No    Emotionally Abused: No    Physically Abused: No    Sexually Abused: No   Current Outpatient Medications on File Prior to Visit  Medication Sig Dispense Refill   amLODipine (NORVASC) 10 MG tablet TAKE 1 TABLET BY MOUTH EVERY DAY 90 tablet 3   atorvastatin (LIPITOR) 80 MG tablet TAKE 1 TABLET BY MOUTH EVERY DAY 90 tablet 2   carvedilol (COREG) 12.5 MG tablet TAKE 1 TABLET BY MOUTH 2 TIMES DAILY. 180 tablet 2   cholecalciferol (VITAMIN D3) 25 MCG (1000 UNIT) tablet Take 1,000 Units by mouth daily.     Coenzyme Q10 (COQ10) 100 MG CAPS Take 100 mg by mouth daily.      Continuous Blood Gluc Transmit (DEXCOM G6 TRANSMITTER) MISC 1 Device by Does not apply route every 3 (three) months. E11.65 1 each 3   dapagliflozin propanediol (FARXIGA) 10 MG TABS tablet TAKE 1 TABLET BY MOUTH EVERY DAY BEFORE BREAKFAST 90 tablet 3   doxycycline (VIBRA-TABS) 100 MG tablet Take 1 tablet (100 mg total) by mouth 2 (two) times daily. 20 tablet 0   fenofibrate 160 MG tablet TAKE 1 TABLET BY MOUTH EVERY DAY 90 tablet 1   glucose blood (ONETOUCH ULTRA) test strip Use to check blood sugar daily. Dx E11.9 200 strip 3   GVOKE HYPOPEN 1-PACK 1 MG/0.2ML SOAJ Inject 0.2 mg under skin as needed for low blood sugars 0.2 mL prn   Insulin Disposable Pump (OMNIPOD 5 G6 PODS, GEN 5,) MISC USE AS INSTRUCTED TO ADMINISTER INSULIN (Patient not taking: Reported on 09/25/2022) 30 each 2   insulin lispro (HUMALOG KWIKPEN) 200 UNIT/ML KwikPen Use as instructed in the insulin pump max daily 145 units 69 mL 3   Insulin Pen Needle 32G X 4 MM MISC Use 4x a day 300 each 3   metFORMIN (GLUCOPHAGE) 1000 MG tablet Take 1.5  tablets (1,500 mg total) by mouth daily. At dinner.     Multiple Vitamins-Minerals (PRESERVISION AREDS 2 PO) Take 1 tablet by mouth 2 (two) times daily.      Omega-3 Fatty Acids (FISH OIL) 1000 MG CAPS Take 1-2 capsules (1,000-2,000 mg total) by mouth in the morning and at  bedtime.  0   sildenafil (VIAGRA) 100 MG tablet Take 1 tablet (100 mg total) by mouth as needed for erectile dysfunction. 30 tablet 6   traZODone (DESYREL) 100 MG tablet TAKE 1 TABLET BY MOUTH EVERYDAY AT BEDTIME 90 tablet 3   valsartan (DIOVAN) 160 MG tablet Take 1 tablet (160 mg total) by mouth daily. 90 tablet 3   Zinc Oxide 15 MG TBDP 2 tabs per day     No current facility-administered medications on file prior to visit.   Allergies  Allergen Reactions   Ozempic (0.25 Or 0.5 Mg-Dose) [Semaglutide(0.25 Or 0.5mg -Dos)]     Presumed cause of GI upset.     Amoxil [Amoxicillin] Rash   Family History  Problem Relation Age of Onset   Hypertension Mother    Diabetes Father    Cancer Neg Hx    COPD Neg Hx    Heart disease Neg Hx    Hyperlipidemia Neg Hx    Stroke Neg Hx    Colon cancer Neg Hx    Prostate cancer Neg Hx    PE: There were no vitals taken for this visit. Wt Readings from Last 3 Encounters:  09/25/22 180 lb 6.4 oz (81.8 kg)  08/06/22 173 lb (78.5 kg)  07/02/22 183 lb (83 kg)   Constitutional: normal weight, in NAD Eyes: EOMI, no exophthalmos ENT: no thyromegaly, no cervical lymphadenopathy Cardiovascular: RRR, No MRG Respiratory: CTA B Musculoskeletal: no deformities Skin:  no rashes Neurological: no tremor with outstretched hands Diabetic Foot Exam - Simple   Simple Foot Form Diabetic Foot exam was performed with the following findings: Yes 01/28/2023 10:47 AM  Visual Inspection No deformities, no ulcerations, no other skin breakdown bilaterally: Yes Sensation Testing Intact to touch and monofilament testing bilaterally: Yes Pulse Check Posterior Tibialis and Dorsalis pulse intact bilaterally: Yes Comments    ASSESSMENT: 1. DM, insulin-dependent, uncontrolled, with complications - CAD, s/p CABG x5 - CKD  He had difficulty obtaining the t:slim X2 insulin pump due to lack of insulin deficiency.  However, his anti-insulin antibodies were  elevated: Component     Latest Ref Rng 05/25/2022 06/12/2022  Glutamic Acid Decarb Ab     <5 IU/mL  <5   IA-2 Antibody     <5.4 U/mL  <5.4   Insulin Antibodies, Human     <0.4 U/mL  3.2 (H)   Glucose     65 - 99 mg/dL 846 (H)    C-Peptide     0.80 - 3.85 ng/mL 1.49     Component     Latest Ref Rng & Units 08/29/2020          Glucose     65 - 99 mg/dL 962 (H)  Hemoglobin X5M     4.0 - 5.6 % 7.5 (A)  C-Peptide     0.80 - 3.85 ng/mL 4.30 (H)  Islet Cell Ab     Neg:<1:1 Negative  ZNT8 Antibodies     <15 U/mL <10  Glutamic Acid Decarb Ab     <5 IU/mL <5  No insulin deficiency or pancreatic autoimmunity.  He has good insulin production.  Of note, we cannot use a GLP-1 receptor  agonist for him due to previously elevated lipase.  2. HL  PLAN:  1. Patient with longstanding well-controlled insulin-dependent diabetes on OmniPod 5 insulin pump previously, currently on t:slim X2 pump changed in 08/2022 due to price.  This is integrated with the Dexcom G7 CGM.  Latest HbA1c was slightly higher at 6.2%, but still excellent.  He is also on metformin and SGLT2 inhibitor as he has a history of CAD and CKD.  At last visit sugars were slightly higher than before with more significant hyperglycemic spikes after lunch and dinner.  We discussed about using doing weight boluses for certain meals, if high.  Explained how to set up and do these boluses.  We also strengthened the ICR before lunch and dinner.  We did not change the rest of the settings.  At that time we changed from the Dexcom G6-G7 CGM. CGM interpretation: -At today's visit, we reviewed his CGM downloads: It appears that 79% of values are in target range (goal >70%), while 21% are higher than 180 (goal <25%), and 0% are lower than 70 (goal <4%).  The calculated average blood sugar is 138.  The projected HbA1c for the next 3 months (GMI) is 6.6%. -Reviewing the CGM trends, sugars appear to be slightly improved from before, mostly fluctuating  within the target range, with the exception of higher blood sugars more consistently after lunch and dinner but late drops in blood sugars after dinner. -At today's visit we discussed about moving his metformin from dinnertime to breakfast.  I will also reduce the dose from 1500 mg to only 1000 mg daily.  To avoid dropping his sugars too much overnight, I advised him to decrease his basal rate after 8 PM.  Will also relax his ICR with dinner.  In the meantime, since his sugars are increasing after lunch, despite correctly entering carbs and bolusing before the meal, I advised him to strengthen his ICR with this meal. - I suggested to:  Patient Instructions  Please continue: - Farxiga 10 mg before b'fast  Change: - Metformin 1000 mg in am  Use the following pump settings: - Basal rates: 12 am: 2 units/h    8 pm: 2 >> 1.7 - Insulin to carb ratio: 12 am: 1:8 6 am: 1:7  12 pm: 1:7 >> 1:6 5 pm: 1:8 >> 1:9 - Target: 12 am: 110 - Correction factor (insulin sensitivity factor):  12 am: 70  Please return in 4 months.  - we checked his HbA1c: 5.8% (lower, excellent) - advised to check sugars at different times of the day - 4x a day, rotating check times - advised for yearly eye exams >> he is UTD - will check an ACR today - return to clinic in 4 months  2. HL -Latest lipid panel from a year ago was at goal except for the low HDL: Lab Results  Component Value Date   CHOL 83 01/08/2022   HDL 23.00 (L) 01/08/2022   LDLCALC 34 01/08/2022   LDLDIRECT 35.0 01/08/2021   TRIG 127.0 01/08/2022   CHOLHDL 4 01/08/2022  -he is on Lipitor 80 mg daily and fenofibrate 160 mg daily without side effects -He is due for another lipid panel-appointment pending with PCP  Carlus Pavlov, MD PhD [facility] Endocrinology

## 2023-01-28 NOTE — Patient Instructions (Addendum)
Please continue: - Farxiga 10 mg before b'fast  Change: - Metformin 1000 mg in am  Use the following pump settings: - Basal rates: 12 am: 2 units/h    8 pm: 2 >> 1.7 - Insulin to carb ratio: 12 am: 1:8 6 am: 1:7  12 pm: 1:7 >> 1:6 5 pm: 1:8 >> 1:9 - Target: 12 am: 110 - Correction factor (insulin sensitivity factor):  12 am: 70  Please return in 4 months.

## 2023-02-02 ENCOUNTER — Other Ambulatory Visit: Payer: Self-pay | Admitting: Family Medicine

## 2023-02-02 ENCOUNTER — Other Ambulatory Visit (INDEPENDENT_AMBULATORY_CARE_PROVIDER_SITE_OTHER): Payer: Medicare Other

## 2023-02-02 DIAGNOSIS — E119 Type 2 diabetes mellitus without complications: Secondary | ICD-10-CM

## 2023-02-02 LAB — CBC WITH DIFFERENTIAL/PLATELET
Basophils Absolute: 0 10*3/uL (ref 0.0–0.1)
Basophils Relative: 1.3 % (ref 0.0–3.0)
Eosinophils Absolute: 0.1 10*3/uL (ref 0.0–0.7)
Eosinophils Relative: 3.1 % (ref 0.0–5.0)
HCT: 45.2 % (ref 39.0–52.0)
Hemoglobin: 14.9 g/dL (ref 13.0–17.0)
Lymphocytes Relative: 23 % (ref 12.0–46.0)
Lymphs Abs: 0.8 10*3/uL (ref 0.7–4.0)
MCHC: 33 g/dL (ref 30.0–36.0)
MCV: 88.7 fL (ref 78.0–100.0)
Monocytes Absolute: 0.7 10*3/uL (ref 0.1–1.0)
Monocytes Relative: 19.6 % — ABNORMAL HIGH (ref 3.0–12.0)
Neutro Abs: 1.8 10*3/uL (ref 1.4–7.7)
Neutrophils Relative %: 53 % (ref 43.0–77.0)
Platelets: 223 10*3/uL (ref 150.0–400.0)
RBC: 5.1 Mil/uL (ref 4.22–5.81)
RDW: 15.2 % (ref 11.5–15.5)
WBC: 3.4 10*3/uL — ABNORMAL LOW (ref 4.0–10.5)

## 2023-02-02 LAB — COMPREHENSIVE METABOLIC PANEL
ALT: 30 U/L (ref 0–53)
AST: 23 U/L (ref 0–37)
Albumin: 5 g/dL (ref 3.5–5.2)
Alkaline Phosphatase: 47 U/L (ref 39–117)
BUN: 17 mg/dL (ref 6–23)
CO2: 29 meq/L (ref 19–32)
Calcium: 9.8 mg/dL (ref 8.4–10.5)
Chloride: 103 meq/L (ref 96–112)
Creatinine, Ser: 1.16 mg/dL (ref 0.40–1.50)
GFR: 59.71 mL/min — ABNORMAL LOW (ref >60.00)
Glucose, Bld: 107 mg/dL — ABNORMAL HIGH (ref 70–99)
Potassium: 4.3 meq/L (ref 3.5–5.1)
Sodium: 139 meq/L (ref 135–145)
Total Bilirubin: 0.7 mg/dL (ref 0.2–1.2)
Total Protein: 7.1 g/dL (ref 6.0–8.3)

## 2023-02-02 LAB — LIPID PANEL
Cholesterol: 91 mg/dL (ref 0–200)
HDL: 23.2 mg/dL — ABNORMAL LOW (ref >39.00)
LDL Cholesterol: 49 mg/dL (ref 0–99)
NonHDL: 68.11
Total CHOL/HDL Ratio: 4
Triglycerides: 98 mg/dL (ref 0.0–149.0)
VLDL: 19.6 mg/dL (ref 0.0–40.0)

## 2023-02-02 LAB — TSH: TSH: 1.47 u[IU]/mL (ref 0.35–5.50)

## 2023-02-09 ENCOUNTER — Ambulatory Visit (INDEPENDENT_AMBULATORY_CARE_PROVIDER_SITE_OTHER): Payer: Medicare Other | Admitting: Family Medicine

## 2023-02-09 ENCOUNTER — Ambulatory Visit
Admission: RE | Admit: 2023-02-09 | Discharge: 2023-02-09 | Disposition: A | Discharge status: Home / Self Care | Payer: Medicare Other | Source: Ambulatory Visit | Attending: Family Medicine | Admitting: Family Medicine

## 2023-02-09 ENCOUNTER — Encounter: Payer: Self-pay | Admitting: Family Medicine

## 2023-02-09 VITALS — BP 140/72 | HR 69 | Temp 97.7°F | Ht 69.5 in | Wt 184.8 lb

## 2023-02-09 DIAGNOSIS — I2510 Atherosclerotic heart disease of native coronary artery without angina pectoris: Secondary | ICD-10-CM

## 2023-02-09 DIAGNOSIS — N529 Male erectile dysfunction, unspecified: Secondary | ICD-10-CM | POA: Diagnosis not present

## 2023-02-09 DIAGNOSIS — G4700 Insomnia, unspecified: Secondary | ICD-10-CM | POA: Diagnosis not present

## 2023-02-09 DIAGNOSIS — I152 Hypertension secondary to endocrine disorders: Secondary | ICD-10-CM

## 2023-02-09 DIAGNOSIS — E1159 Type 2 diabetes mellitus with other circulatory complications: Secondary | ICD-10-CM | POA: Diagnosis not present

## 2023-02-09 DIAGNOSIS — M79602 Pain in left arm: Secondary | ICD-10-CM

## 2023-02-09 DIAGNOSIS — E782 Mixed hyperlipidemia: Secondary | ICD-10-CM

## 2023-02-09 DIAGNOSIS — M4802 Spinal stenosis, cervical region: Secondary | ICD-10-CM | POA: Diagnosis not present

## 2023-02-09 DIAGNOSIS — M47812 Spondylosis without myelopathy or radiculopathy, cervical region: Secondary | ICD-10-CM | POA: Diagnosis not present

## 2023-02-09 DIAGNOSIS — Z0000 Encounter for general adult medical examination without abnormal findings: Secondary | ICD-10-CM

## 2023-02-09 DIAGNOSIS — Z794 Long term (current) use of insulin: Secondary | ICD-10-CM

## 2023-02-09 MED ORDER — AMLODIPINE BESYLATE 10 MG PO TABS: 10.0000 mg | ORAL_TABLET | Freq: Every day | ORAL | 3 refills | Status: DC

## 2023-02-09 MED ORDER — SILDENAFIL CITRATE 100 MG PO TABS: 100.0000 mg | ORAL_TABLET | ORAL | 6 refills | Status: AC | PRN

## 2023-02-09 MED ORDER — FENOFIBRATE 160 MG PO TABS: 160.0000 mg | ORAL_TABLET | Freq: Every day | ORAL | 3 refills | Status: DC

## 2023-02-09 MED ORDER — METFORMIN HCL 1000 MG PO TABS: 1000.0000 mg | ORAL_TABLET | Freq: Every day | ORAL | Status: DC

## 2023-02-09 MED ORDER — TRAZODONE HCL 150 MG PO TABS: 150.0000 mg | ORAL_TABLET | Freq: Every day | ORAL | 3 refills | Status: DC

## 2023-02-09 NOTE — Patient Instructions (Signed)
Go to the lab on the way out.   If you have mychart we'll likely use that to update you.    Take care.  Glad to see you. 

## 2023-02-09 NOTE — Progress Notes (Unsigned)
  Tetanus 2023 Flu shot 2024 covid vaccine can be done out of clinic.  PNA up to date.  Shingles 2020.  RSV vaccine prev done    Hypertension:    Using medication without problems or lightheadedness: yes Chest pain with exertion:no Edema:no Short of breath:no  Elevated Cholesterol: Using medications without problems: yes Muscle aches: some aches but likely not from statin.   Diet compliance: Exercise:  L arm aching and L 4th/5th finger sx.  Positionally worse at night.  Going on for about 9 months.  No RUE or LLE.    ED.  No ADE on med.  Not lightheaded.  It helped.    Insomnia.  Improved with 150mg  instead of 100mg  trazodone.  No ADE on med.    CAD.  H/o post op AF.  No heart racing in the meantime.  Still walking 2.8 miles w/o troubles, on a trial.  Not SOB.   DM2.  Per endocrine.    Meds, vitals, and allergies reviewed.  ROS: Per HPI unless specifically indicated in ROS section   GEN: nad, alert and oriented HEENT: mucous membranes moist NECK: supple w/o LA CV: rrr. PULM: ctab, no inc wob ABD: soft, +bs EXT: no edema SKIN: no acute rash Normal S/S BUE.  Normal grip.

## 2023-02-10 ENCOUNTER — Telehealth: Payer: Self-pay | Admitting: Family Medicine

## 2023-02-10 DIAGNOSIS — M79602 Pain in left arm: Secondary | ICD-10-CM | POA: Insufficient documentation

## 2023-02-10 NOTE — Assessment & Plan Note (Addendum)
Able to walk nearly 3 miles without getting short of breath or chest pain.Continue amlodipine Lipitor carvedilol fenofibrate fish oil and valsartan.

## 2023-02-10 NOTE — Assessment & Plan Note (Signed)
Tetanus 2023 Flu shot 2024 covid vaccine can be done out of clinic.  PNA up to date.  Shingles 2020.  RSV vaccine prev done

## 2023-02-10 NOTE — Telephone Encounter (Signed)
Please call patient.  I looked at his x-rays.  I am awaiting radiology overread.  It may be several weeks before we get that.  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  I think will be reasonable to try PT.  Let me know if he needs a referral.  If he has any progression of symptoms then let me know.  Thanks.

## 2023-02-10 NOTE — Assessment & Plan Note (Signed)
Improved with 150mg  instead of 100mg  trazodone.  No ADE on med.   Continue as is with the 150 mg trazodone.

## 2023-02-10 NOTE — Assessment & Plan Note (Signed)
Continue sildenafil as needed. 

## 2023-02-10 NOTE — Assessment & Plan Note (Signed)
Per endocrine.  I will defer.  He agrees.

## 2023-02-10 NOTE — Assessment & Plan Note (Signed)
Continue amlodipine Lipitor carvedilol fenofibrate fish oil and valsartan.  Continue work on diet and exercise.  Labs discussed with patient.

## 2023-02-10 NOTE — Assessment & Plan Note (Signed)
Differential discussed.  No emergent symptoms.  No reason to suspect cardiac source.  More likely to be mechanical with nerve root impingement since he has positional symptoms at night.  Check plain films on the neck.  See notes on imaging.  Okay for outpatient follow-up.

## 2023-02-11 NOTE — Telephone Encounter (Signed)
Patients is agreeable to referral for physical therapy.

## 2023-02-12 NOTE — Telephone Encounter (Signed)
I put in the referral.  Thanks.  

## 2023-02-15 ENCOUNTER — Encounter: Payer: Self-pay | Admitting: Internal Medicine

## 2023-02-16 ENCOUNTER — Encounter: Payer: Medicare Other | Attending: Internal Medicine | Admitting: Nutrition

## 2023-02-16 DIAGNOSIS — E1165 Type 2 diabetes mellitus with hyperglycemia: Secondary | ICD-10-CM | POA: Insufficient documentation

## 2023-02-16 MED ORDER — INSULIN LISPRO (1 UNIT DIAL) 100 UNIT/ML (KWIKPEN): PEN_INJECTOR | SUBCUTANEOUS | 2 refills | Status: DC

## 2023-02-16 MED ORDER — INSULIN GLARGINE 100 UNIT/ML SOLOSTAR PEN: 25.0000 [IU] | PEN_INJECTOR | Freq: Every day | SUBCUTANEOUS | 1 refills | Status: DC

## 2023-02-16 NOTE — Telephone Encounter (Signed)
Per his med list he is using Humalog via pump. OK to send in insulin pens? If so which one and dosing.

## 2023-02-18 NOTE — Progress Notes (Signed)
Patient brought his new pump in for settings to be put in.  His settings were entered per Dr. Charlean Sanfilippo last office note, and his CGM  was set up and linked to his new sensor.  The pump was then linked to [employer] source.  He had no final questions.

## 2023-02-19 DIAGNOSIS — M65342 Trigger finger, left ring finger: Secondary | ICD-10-CM | POA: Diagnosis not present

## 2023-02-19 DIAGNOSIS — M65311 Trigger thumb, right thumb: Secondary | ICD-10-CM | POA: Diagnosis not present

## 2023-02-21 ENCOUNTER — Encounter: Payer: Self-pay | Admitting: Family Medicine

## 2023-02-21 ENCOUNTER — Other Ambulatory Visit: Payer: Self-pay | Admitting: Family Medicine

## 2023-02-21 DIAGNOSIS — I779 Disorder of arteries and arterioles, unspecified: Secondary | ICD-10-CM

## 2023-03-11 ENCOUNTER — Other Ambulatory Visit: Payer: Self-pay | Admitting: Internal Medicine

## 2023-03-15 ENCOUNTER — Other Ambulatory Visit: Payer: Self-pay | Admitting: Cardiovascular Disease

## 2023-03-16 NOTE — Telephone Encounter (Signed)
 Last office visit: 06/30/22 with plan to f/u in 12 months   Next visit: none/active recall

## 2023-03-22 ENCOUNTER — Other Ambulatory Visit: Payer: Self-pay | Admitting: Internal Medicine

## 2023-03-22 DIAGNOSIS — E1159 Type 2 diabetes mellitus with other circulatory complications: Secondary | ICD-10-CM

## 2023-03-25 ENCOUNTER — Ambulatory Visit: Payer: Medicare Other

## 2023-03-26 DIAGNOSIS — M65311 Trigger thumb, right thumb: Secondary | ICD-10-CM | POA: Diagnosis not present

## 2023-04-07 ENCOUNTER — Ambulatory Visit: Payer: Medicare Other | Attending: Family Medicine

## 2023-04-07 DIAGNOSIS — M25512 Pain in left shoulder: Secondary | ICD-10-CM | POA: Insufficient documentation

## 2023-04-07 DIAGNOSIS — R293 Abnormal posture: Secondary | ICD-10-CM | POA: Insufficient documentation

## 2023-04-07 DIAGNOSIS — M79602 Pain in left arm: Secondary | ICD-10-CM | POA: Diagnosis not present

## 2023-04-07 DIAGNOSIS — G8929 Other chronic pain: Secondary | ICD-10-CM | POA: Insufficient documentation

## 2023-04-07 NOTE — Therapy (Signed)
OUTPATIENT PHYSICAL THERAPY UPPER EXTREMITY EVALUATION   Patient Name: Luis [facility] MRN: [id number] DOB:07/07/1940, 83 y.o., male Today's Date: 04/07/2023  END OF SESSION:  PT End of Session - 04/07/23 2045     Visit Number 1    Number of Visits 5    Date for PT Re-Evaluation 05/05/23    PT Start Time 1431    PT Stop Time 1515    PT Time Calculation (min) 44 min    Activity Tolerance Patient tolerated treatment well    Behavior During Therapy [facility] for tasks assessed/performed             Past Medical History:  Diagnosis Date   Allergy    Amoxicillin   Cataract 2017   Mild progression   Coronary artery disease    a. s/p 5-vessel CABG (LIMA-LAD, SVG-diag, sequential SVG-OM1-OM 2, & SVG-PDA)   Diabetes mellitus with complication (HCC)    Hyperlipidemia    Hypertension    Insomnia    neuropathy    bilateral feet   Postoperative atrial fibrillation (HCC)    Past Surgical History:  Procedure Laterality Date   CARDIAC CATHETERIZATION     01/19/2020   COLONOSCOPY WITH PROPOFOL N/A 04/01/2021   Procedure: COLONOSCOPY WITH PROPOFOL;  Surgeon: Midge Minium, MD;  Location: [facility] ENDOSCOPY;  Service: Endoscopy;  Laterality: N/A;   CORONARY ARTERY BYPASS GRAFT N/A 01/29/2020   Procedure: CORONARY ARTERY BYPASS GRAFTING (CABG) TIMES FIVE USING LIMA to LAD; ENDOSCOPIC HARVESTED GREATER SAPHENOUS VEIN: SVG to PDA; SVG to sequenced OM1 & OM2.;  Surgeon: Kerin Perna, MD;  Location: [facility] OR;  Service: Open Heart Surgery;  Laterality: N/A;   ENDOVEIN HARVEST OF GREATER SAPHENOUS VEIN Bilateral 01/29/2020   Procedure: ENDOVEIN HARVEST OF GREATER SAPHENOUS VEIN;  Surgeon: Kerin Perna, MD;  Location: [facility] OR;  Service: Open Heart Surgery;  Laterality: Bilateral;   EYE SURGERY  Radial K   1997   LAMINECTOMY  1993   L5-S1   LEFT HEART CATH AND CORONARY ANGIOGRAPHY N/A 01/19/2020   Procedure: LEFT HEART CATH AND CORONARY ANGIOGRAPHY;  Surgeon: Iran Ouch, MD;  Location: [facility]  INVASIVE CV LAB;  Service: Cardiovascular;  Laterality: N/A;   MENISCUS REPAIR  1996   TEE WITHOUT CARDIOVERSION N/A 01/29/2020   Procedure: TRANSESOPHAGEAL ECHOCARDIOGRAM (TEE);  Surgeon: Donata Clay, Theron Arista, MD;  Location: [facility] OR;  Service: Open Heart Surgery;  Laterality: N/A;   TONSILLECTOMY     Patient Active Problem List   Diagnosis Date Noted   Carotid arterial disease (HCC) 02/21/2023   Left arm pain 02/10/2023   History of colonic polyps    Anemia 02/06/2020   Atrial fibrillation (HCC) 02/02/2020   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   CAD (coronary artery disease) 01/23/2020   Healthcare maintenance 01/14/2020   Insomnia    Erectile dysfunction 01/02/2019   Diabetes mellitus (HCC) 06/27/2018   Advanced care planning/counseling discussion 06/16/2017   BPH (benign prostatic hyperplasia) 06/08/2016   Hyperlipemia 10/25/2015   Hypertension associated with diabetes (HCC) 10/25/2015    PCP: Crawford Givens MD  REFERRING PROVIDER: Crawford Givens MD  REFERRING DIAG: THERAPY DIAG: W09.811 (ICD-10-CM) - Left arm pain  Chronic left shoulder pain  Abnormal posture  Rationale for Evaluation and Treatment: Rehabilitation  ONSET DATE: ~ 1 year  SUBJECTIVE:  SUBJECTIVE STATEMENT: Pt is an 83 y.o. male referred to OPPT for L arm pain.  Hand dominance: Right  PERTINENT HISTORY: Pt reports gradual LUE pain for about 1 year. Symptoms occur only with sleeping on L side. Pain refers to finger tips. All 5 fingers also tingle. Symptoms are only reproduced with sleeping on his L side. Easily subsides in ~ 2 minutes. Has remained consistent, has not improved but also has not worsened. Pain quality is a dull ache, rates it about a 2/10 NPS. Sensation of his arm falling asleep in his hand. Has tried abducting arm with pillow with  no success. Denies cervical motion or LUE use reproducing his symptoms. B/B habits normal, denies any saddle anesthesia. Still works part name, mostly desk work.   PAIN:  Are you having pain? No  PRECAUTIONS: None  RED FLAGS: None   WEIGHT BEARING RESTRICTIONS: No  FALLS:  Has patient fallen in last 6 months? 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  DIAGNOSTIC FINDINGS:  CLINICAL DATA:  Left arm pain   EXAM: CERVICAL SPINE - COMPLETE 4+ VIEW   COMPARISON:  None Available.   FINDINGS: Alignment within normal limits. Vertebral body heights are maintained. Moderate disc space narrowing at C3-C4 with advanced disc space narrowing and degenerative change C5-C6 and C6-C7. Lateral masses are within normal limits. Carotid vascular calcifications. 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     Electronically Signed   By: Jasmine Pang M.D.   On: 02/20/2023 01:56   PATIENT SURVEYS :  [name] deferred to next session  COGNITION: Overall cognitive status: Within functional limits for tasks assessed     SENSATION: Light touch: WFL  POSTURE: Rounded shoulders, mild forward head posture  Cervical AROM:   Extension: 67 deg Flexion: 60 deg Rotation R/L: 55/65 deg Lateral flexion R/L: 30/30 deg   UPPER EXTREMITY ROM:   Active ROM Right eval Left eval  Shoulder flexion 180 165  Shoulder extension    Shoulder abduction 180 180  Shoulder adduction    Shoulder internal rotation 90 90  Shoulder external rotation 90 80  Elbow flexion    Elbow extension    Wrist flexion    Wrist extension    Wrist ulnar deviation    Wrist radial deviation    Wrist pronation    Wrist supination    (Blank rows = not  tested)  UPPER EXTREMITY MMT:  MMT Right eval Left eval  Shoulder flexion    Shoulder extension    Shoulder abduction    Shoulder adduction    Shoulder internal rotation    Shoulder external rotation    Middle trapezius    Lower trapezius    Elbow flexion    Elbow extension    Wrist flexion    Wrist extension    Wrist ulnar deviation    Wrist radial deviation    Wrist pronation    Wrist supination    Grip strength (lbs)    (Blank rows = not tested)  SHOULDER SPECIAL TESTS: Not indicated   CERVICAL SPECIAL TESTS: Spurling's A and B: negative  ULTT for ulnar, median, and radial nerve all negative   ELBOW SPECIAL TESTS: Tinel's sign: negative   WRIST SPECIAL TESTS: Apley's: negative   JOINT MOBILITY TESTING:  Normal  GHJ mobility in AP/PA and inferior  L first rib with normal mobility. Does not reproduce symptoms.  C2-C7, T1-T8 CPA's normal joint mobility and painless  PALPATION:  Not TTP along any RTC musculature or cervical spinal processes or paraspinals. No TTP along pec minor. Does not reproduce any radicular or referred symptoms.  FUNCTIONAL TESTS:  Sleep posture. Pt sleeps with cervical spine/head in neutral position with entire spine. LUE placed in abduction and excessive L shoulder ER.                                                                                                                            TREATMENT DATE: 04/07/23  Educated on HEP. Educated on sleeping adjustments with LUE placements in neutral positions, use of pillows to offload/support L shoulder.   PATIENT EDUCATION: Education details: HEP, POC, sleeping posture adjustments Person educated: Patient Education method: Explanation, Demonstration, and Handouts Education comprehension: verbalized understanding and needs further education  HOME EXERCISE PROGRAM: Access Code: [email or web address] URL: [email or web address] Date: 04/07/2023 Prepared by: Ronnie Derby  Exercises -  Prone Chest Stretch on Chair  - 1 x daily - 7 x weekly - 1 sets - 3 reps - 30 hold - Seated Upper Trapezius Stretch  - 1 x daily - 7 x weekly - 1 sets - 3 reps - 30 hold - Supine Shoulder Flexion AAROM with Hands Clasped  - 1 x daily - 7 x weekly - 1 sets - 3 reps - 30 hold  ASSESSMENT:  CLINICAL IMPRESSION: Patient is a 83 y.o. male who was seen today for physical therapy evaluation and treatment for chronic L arm pain. Pt presents with vague symptoms and limited capacity to reproduce pt's concordant symptoms. Pt displays some limitations in cervical ROM, L shoulder AROM with pain reproduction with L shoulder flexion. All ULTT, cervical radiculopathy signs and special tests negative as well with further screening or nerve entrapments at elbow/wrist all negative. Pt does display possible nerve tension with sleeping posture based off of LUE placement thus educated on sleeping posture modifications. PT to trial 1x/week for 4 weeks to attempt and address pt's pain and N/T and return to PLOF.     OBJECTIVE IMPAIRMENTS: decreased mobility, decreased ROM, and postural dysfunction.   ACTIVITY LIMITATIONS: sleeping  PARTICIPATION LIMITATIONS:  None  PERSONAL FACTORS: Age, Past/current experiences, and Time since onset of injury/illness/exacerbation are also affecting patient's functional outcome.   REHAB POTENTIAL: Fair Chronic condition with inability to reproduce symptoms  CLINICAL DECISION MAKING: Stable/uncomplicated  EVALUATION COMPLEXITY: Low  GOALS: Goals reviewed with patient? No  SHORT TERM GOALS: Target date: 04/21/23  Pt will be independent with HEP to improve LUE pain and N/T with sleep. Baseline:04/07/23: provided HEP Goal status: INITIAL  LONG TERM GOALS: Target date: 05/05/23  Pt will improve cervical and L shoulder AROM to normal limits to improve joint mobility and flexibility for improve nerve pathway mobility.  Baseline: 04/07/23: see graph above for AROM measurements Goal  status: INITIAL  2.  Pt will report return to full night's sleep without reproduction of LUE symptoms to demonstrate return to PLOF.  Baseline: 04/07/23: sleeping on L side causes LUE pain and N/T Goal status: INITIAL  PLAN: PT FREQUENCY: 1x/week  PT DURATION: 4 weeks  PLANNED INTERVENTIONS: 97164- PT Re-evaluation, 97110-Therapeutic exercises, 97530- Therapeutic activity, 97112- Neuromuscular re-education, 97535- Self Care, 16109- Manual therapy, 97014- Electrical stimulation (unattended), [phone]- Electrical stimulation (manual), Patient/Family education, Dry Needling, Joint mobilization, Joint manipulation, Spinal manipulation, Spinal mobilization, Cryotherapy, and Moist heat  PLAN FOR NEXT SESSION: Reassess sleeping postures, review HEP. Screen quick DASH.   Delphia Grates. Fairly IV, PT, DPT Physical Therapist- [place]  [facility]  04/07/2023, 8:46 PM

## 2023-04-07 NOTE — Progress Notes (Signed)
Physician Signature: Joaquim Nam, MD Date:____,01/29/25___ Time:___9:16 PM__

## 2023-04-08 ENCOUNTER — Ambulatory Visit: Payer: Medicare Other | Attending: Family Medicine

## 2023-04-08 DIAGNOSIS — I779 Disorder of arteries and arterioles, unspecified: Secondary | ICD-10-CM | POA: Diagnosis not present

## 2023-04-11 ENCOUNTER — Encounter: Payer: Self-pay | Admitting: Family Medicine

## 2023-04-16 ENCOUNTER — Ambulatory Visit: Payer: Medicare Other

## 2023-04-21 ENCOUNTER — Ambulatory Visit: Payer: Medicare Other | Attending: Family Medicine

## 2023-04-21 DIAGNOSIS — G8929 Other chronic pain: Secondary | ICD-10-CM | POA: Insufficient documentation

## 2023-04-21 DIAGNOSIS — M25512 Pain in left shoulder: Secondary | ICD-10-CM | POA: Diagnosis not present

## 2023-04-21 DIAGNOSIS — R293 Abnormal posture: Secondary | ICD-10-CM | POA: Insufficient documentation

## 2023-04-21 NOTE — Therapy (Signed)
OUTPATIENT PHYSICAL THERAPY UPPER EXTREMITY TREATMENT   Patient Name: Luis Wise MRN: [id number] DOB:1940-04-10, 83 y.o., male Today's Date: 04/21/2023  END OF SESSION:  PT End of Session - 04/21/23 0906     Visit Number 2    Number of Visits 5    Date for PT Re-Evaluation 05/05/23    PT Start Time 0901    PT Stop Time 0945    PT Time Calculation (min) 44 min    Activity Tolerance Patient tolerated treatment well    Behavior During Therapy [facility] for tasks assessed/performed             Past Medical History:  Diagnosis Date   Allergy    Amoxicillin   Cataract 2017   Mild progression   Coronary artery disease    a. s/p 5-vessel CABG (LIMA-LAD, SVG-diag, sequential SVG-OM1-OM 2, & SVG-PDA)   Diabetes mellitus with complication (HCC)    Hyperlipidemia    Hypertension    Insomnia    neuropathy    bilateral feet   Postoperative atrial fibrillation (HCC)    Past Surgical History:  Procedure Laterality Date   CARDIAC CATHETERIZATION     01/19/2020   COLONOSCOPY WITH PROPOFOL N/A 04/01/2021   Procedure: COLONOSCOPY WITH PROPOFOL;  Surgeon: Midge Minium, MD;  Location: [facility] ENDOSCOPY;  Service: Endoscopy;  Laterality: N/A;   CORONARY ARTERY BYPASS GRAFT N/A 01/29/2020   Procedure: CORONARY ARTERY BYPASS GRAFTING (CABG) TIMES FIVE USING LIMA to LAD; ENDOSCOPIC HARVESTED GREATER SAPHENOUS VEIN: SVG to PDA; SVG to sequenced OM1 & OM2.;  Surgeon: Kerin Perna, MD;  Location: [facility] OR;  Service: Open Heart Surgery;  Laterality: N/A;   ENDOVEIN HARVEST OF GREATER SAPHENOUS VEIN Bilateral 01/29/2020   Procedure: ENDOVEIN HARVEST OF GREATER SAPHENOUS VEIN;  Surgeon: Kerin Perna, MD;  Location: [facility] OR;  Service: Open Heart Surgery;  Laterality: Bilateral;   EYE SURGERY  Radial K   1997   LAMINECTOMY  1993   L5-S1   LEFT HEART CATH AND CORONARY ANGIOGRAPHY N/A 01/19/2020   Procedure: LEFT HEART CATH AND CORONARY ANGIOGRAPHY;  Surgeon: Iran Ouch, MD;  Location: [facility]  INVASIVE CV LAB;  Service: Cardiovascular;  Laterality: N/A;   MENISCUS REPAIR  1996   TEE WITHOUT CARDIOVERSION N/A 01/29/2020   Procedure: TRANSESOPHAGEAL ECHOCARDIOGRAM (TEE);  Surgeon: Donata Clay, Theron Arista, MD;  Location: [facility] OR;  Service: Open Heart Surgery;  Laterality: N/A;   TONSILLECTOMY     Patient Active Problem List   Diagnosis Date Noted   Carotid arterial disease (HCC) 02/21/2023   Left arm pain 02/10/2023   History of colonic polyps    Anemia 02/06/2020   Atrial fibrillation (HCC) 02/02/2020   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   CAD (coronary artery disease) 01/23/2020   Healthcare maintenance 01/14/2020   Insomnia    Erectile dysfunction 01/02/2019   Diabetes mellitus (HCC) 06/27/2018   Advanced care planning/counseling discussion 06/16/2017   BPH (benign prostatic hyperplasia) 06/08/2016   Hyperlipemia 10/25/2015   Hypertension associated with diabetes (HCC) 10/25/2015    PCP: Crawford Givens MD  REFERRING PROVIDER: Crawford Givens MD  REFERRING DIAG: THERAPY DIAG: W09.811 (ICD-10-CM) - Left arm pain  Chronic left shoulder pain  Abnormal posture  Rationale for Evaluation and Treatment: Rehabilitation  ONSET DATE: ~ 1 year  SUBJECTIVE:  SUBJECTIVE STATEMENT: Pt reports significant improvement in LUE symptoms. Has not changed his sleeping posture, just worked on his HEP. Hand dominance: Right  PERTINENT HISTORY: Pt reports gradual LUE pain for about 1 year. Symptoms occur only with sleeping on L side. Pain refers to finger tips. All 5 fingers also tingle. Symptoms are only reproduced with sleeping on his L side. Easily subsides in ~ 2 minutes. Has remained consistent, has not improved but also has not worsened. Pain quality is a dull ache, rates it about a 2/10 NPS. Sensation of his arm falling  asleep in his hand. Has tried abducting arm with pillow with no success. Denies cervical motion or LUE use reproducing his symptoms. B/B habits normal, denies any saddle anesthesia. Still works part name, mostly desk work.   PAIN:  Are you having pain? No  PRECAUTIONS: None  RED FLAGS: None   WEIGHT BEARING RESTRICTIONS: No  FALLS:  Has patient fallen in last 6 months? 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  DIAGNOSTIC FINDINGS:  CLINICAL DATA:  Left arm pain   EXAM: CERVICAL SPINE - COMPLETE 4+ VIEW   COMPARISON:  None Available.   FINDINGS: Alignment within normal limits. Vertebral body heights are maintained. Moderate disc space narrowing at C3-C4 with advanced disc space narrowing and degenerative change C5-C6 and C6-C7. Lateral masses are within normal limits. Carotid vascular calcifications. 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     Electronically Signed   By: Jasmine Pang M.D.   On: 02/20/2023 01:56   PATIENT SURVEYS :  Luis Wise deferred to next session  COGNITION: Overall cognitive status: Within functional limits for tasks assessed     SENSATION: Light touch: WFL  POSTURE: Rounded shoulders, mild forward head posture  Cervical AROM:   Extension: 67 deg Flexion: 60 deg Rotation R/L: 55/65 deg Lateral flexion R/L: 30/30 deg   UPPER EXTREMITY ROM:   Active ROM Right eval Left eval  Shoulder flexion 180 165  Shoulder extension    Shoulder abduction 180 180  Shoulder adduction    Shoulder internal rotation 90 90  Shoulder external rotation 90 80  Elbow flexion    Elbow extension    Wrist flexion    Wrist extension    Wrist ulnar deviation    Wrist radial deviation    Wrist  pronation    Wrist supination    (Blank rows = not tested)  UPPER EXTREMITY MMT:  MMT Right eval Left eval  Shoulder flexion    Shoulder extension    Shoulder abduction    Shoulder adduction    Shoulder internal rotation    Shoulder external rotation    Middle trapezius    Lower trapezius    Elbow flexion    Elbow extension    Wrist flexion    Wrist extension    Wrist ulnar deviation    Wrist radial deviation    Wrist pronation    Wrist supination    Grip strength (lbs)    (Blank rows = not tested)  SHOULDER SPECIAL TESTS: Not indicated   CERVICAL SPECIAL TESTS: Spurling's A and B: negative  ULTT for ulnar, median, and radial nerve all negative   ELBOW SPECIAL TESTS: Tinel's sign: negative   WRIST SPECIAL TESTS: Apley's: negative   JOINT  MOBILITY TESTING:  Normal GHJ mobility in AP/PA and inferior  L first rib with normal mobility. Does not reproduce symptoms.  C2-C7, T1-T8 CPA's normal joint mobility and painless  PALPATION:  Not TTP along any RTC musculature or cervical spinal processes or paraspinals. No TTP along pec minor. Does not reproduce any radicular or referred symptoms.  FUNCTIONAL TESTS:  Sleep posture. Pt sleeps with cervical spine/head in neutral position with entire spine. LUE placed in abduction and excessive L shoulder ER.                                                                                                                            TREATMENT DATE: 04/21/23  There.ex:   UBE L6, 2 min forward, 2 min backward for shoulder warm up. Not billed.   Supine AAROM shoulder flexion with hands clasped: x12   Seated thoracic extension over 1/2 bolster: 2x12   Seated prayer stretch: 2x30 sec holds   Reviewed updated HEP. Educated on reps/sets/frequency   There.Act:   Seated B shoulder ER + scap retraction: 3x8, blue TB   Standing B shoulder abduction wall angels for posture and scapulohumeral rhythm: 2x12    PATIENT  EDUCATION: Education details: HEP, POC, sleeping posture adjustments Person educated: Patient Education method: Explanation, Demonstration, and Handouts Education comprehension: verbalized understanding and needs further education  HOME EXERCISE PROGRAM: Access Code: [email or web address] URL: [email or web address] Date: 04/07/2023 Prepared by: Ronnie Derby  Exercises - Prone Chest Stretch on Chair  - 1 x daily - 7 x weekly - 1 sets - 3 reps - 30 hold - Seated Upper Trapezius Stretch  - 1 x daily - 7 x weekly - 1 sets - 3 reps - 30 hold - Supine Shoulder Flexion AAROM with Hands Clasped  - 1 x daily - 7 x weekly - 1 sets - 3 reps - 30 hold  ASSESSMENT:  CLINICAL IMPRESSION: Pt arriving for first treatment session. Pt endorsing significant relief of UE symptoms with HEP compliance. Pt with good understanding of HEP as evidenced by session today. Pt does demonstrate muscular imbalance and inflexibility and decreased scapulohumeral rhythm with LUE compared to RUE which may be contributing to his vague LUE symptoms with sleeping posture. Updated HEP to reinforce postural correction and scapulohumeral rhythm. Encouraged pt to f/u next week as scheduled and if pt continues to be improved he may be appropriate for discharge.    OBJECTIVE IMPAIRMENTS: decreased mobility, decreased ROM, and postural dysfunction.   ACTIVITY LIMITATIONS: sleeping  PARTICIPATION LIMITATIONS:  None  PERSONAL FACTORS: Age, Past/current experiences, and Time since onset of injury/illness/exacerbation are also affecting patient's functional outcome.   REHAB POTENTIAL: Fair Chronic condition with inability to reproduce symptoms  CLINICAL DECISION MAKING: Stable/uncomplicated  EVALUATION COMPLEXITY: Low  GOALS: Goals reviewed with patient? No  SHORT TERM GOALS: Target date: 04/21/23  Pt will be independent with HEP to improve LUE pain and N/T with sleep. Baseline:04/07/23: provided HEP Goal status:  INITIAL  LONG TERM GOALS: Target date: 05/05/23  Pt will improve cervical and L shoulder AROM to normal limits to improve joint mobility and flexibility for improve nerve pathway mobility.  Baseline: 04/07/23: see graph above for AROM measurements Goal status: INITIAL  2.  Pt will report return to full night's sleep without reproduction of LUE symptoms to demonstrate return to PLOF.  Baseline: 04/07/23: sleeping on L side causes LUE pain and N/T Goal status: INITIAL  PLAN: PT FREQUENCY: 1x/week  PT DURATION: 4 weeks  PLANNED INTERVENTIONS: 97164- PT Re-evaluation, 97110-Therapeutic exercises, 97530- Therapeutic activity, 97112- Neuromuscular re-education, 97535- Self Care, 95284- Manual therapy, 97014- Electrical stimulation (unattended), [phone]- Electrical stimulation (manual), Patient/Family education, Dry Needling, Joint mobilization, Joint manipulation, Spinal manipulation, Spinal mobilization, Cryotherapy, and Moist heat  PLAN FOR NEXT SESSION: L shoulder scapulohumeral rhythm and postural strengthening. Cervical AROM.    Delphia Grates. Fairly IV, PT, DPT Physical Therapist- [place]  [facility]  04/21/2023, 9:54 AM

## 2023-04-24 DIAGNOSIS — E119 Type 2 diabetes mellitus without complications: Secondary | ICD-10-CM | POA: Diagnosis not present

## 2023-04-24 DIAGNOSIS — H524 Presbyopia: Secondary | ICD-10-CM | POA: Diagnosis not present

## 2023-04-24 DIAGNOSIS — H2513 Age-related nuclear cataract, bilateral: Secondary | ICD-10-CM | POA: Diagnosis not present

## 2023-04-28 ENCOUNTER — Ambulatory Visit: Payer: Medicare Other

## 2023-04-28 DIAGNOSIS — G8929 Other chronic pain: Secondary | ICD-10-CM

## 2023-04-28 DIAGNOSIS — R293 Abnormal posture: Secondary | ICD-10-CM

## 2023-04-28 DIAGNOSIS — M25512 Pain in left shoulder: Secondary | ICD-10-CM | POA: Diagnosis not present

## 2023-04-28 NOTE — Therapy (Signed)
OUTPATIENT PHYSICAL THERAPY UPPER EXTREMITY TREATMENT   Patient Name: Luis Wise MRN: [id number] DOB:November 23, 1940, 83 y.o., male Today's Date: 04/28/2023  END OF SESSION:  PT End of Session - 04/28/23 4098     Visit Number 3    Number of Visits 5    Date for PT Re-Evaluation 05/05/23    PT Start Time 0814    PT Stop Time 0833    PT Time Calculation (min) 19 min    Activity Tolerance Patient tolerated treatment well    Behavior During Therapy [facility] for tasks assessed/performed             Past Medical History:  Diagnosis Date   Allergy    Amoxicillin   Cataract 2017   Mild progression   Coronary artery disease    a. s/p 5-vessel CABG (LIMA-LAD, SVG-diag, sequential SVG-OM1-OM 2, & SVG-PDA)   Diabetes mellitus with complication (HCC)    Hyperlipidemia    Hypertension    Insomnia    neuropathy    bilateral feet   Postoperative atrial fibrillation (HCC)    Past Surgical History:  Procedure Laterality Date   CARDIAC CATHETERIZATION     01/19/2020   COLONOSCOPY WITH PROPOFOL N/A 04/01/2021   Procedure: COLONOSCOPY WITH PROPOFOL;  Surgeon: Midge Minium, MD;  Location: [facility] ENDOSCOPY;  Service: Endoscopy;  Laterality: N/A;   CORONARY ARTERY BYPASS GRAFT N/A 01/29/2020   Procedure: CORONARY ARTERY BYPASS GRAFTING (CABG) TIMES FIVE USING LIMA to LAD; ENDOSCOPIC HARVESTED GREATER SAPHENOUS VEIN: SVG to PDA; SVG to sequenced OM1 & OM2.;  Surgeon: Kerin Perna, MD;  Location: [facility] OR;  Service: Open Heart Surgery;  Laterality: N/A;   ENDOVEIN HARVEST OF GREATER SAPHENOUS VEIN Bilateral 01/29/2020   Procedure: ENDOVEIN HARVEST OF GREATER SAPHENOUS VEIN;  Surgeon: Kerin Perna, MD;  Location: [facility] OR;  Service: Open Heart Surgery;  Laterality: Bilateral;   EYE SURGERY  Radial K   1997   LAMINECTOMY  1993   L5-S1   LEFT HEART CATH AND CORONARY ANGIOGRAPHY N/A 01/19/2020   Procedure: LEFT HEART CATH AND CORONARY ANGIOGRAPHY;  Surgeon: Iran Ouch, MD;  Location: [facility]  INVASIVE CV LAB;  Service: Cardiovascular;  Laterality: N/A;   MENISCUS REPAIR  1996   TEE WITHOUT CARDIOVERSION N/A 01/29/2020   Procedure: TRANSESOPHAGEAL ECHOCARDIOGRAM (TEE);  Surgeon: Donata Clay, Theron Arista, MD;  Location: [facility] OR;  Service: Open Heart Surgery;  Laterality: N/A;   TONSILLECTOMY     Patient Active Problem List   Diagnosis Date Noted   Carotid arterial disease (HCC) 02/21/2023   Left arm pain 02/10/2023   History of colonic polyps    Anemia 02/06/2020   Atrial fibrillation (HCC) 02/02/2020   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   CAD (coronary artery disease) 01/23/2020   Healthcare maintenance 01/14/2020   Insomnia    Erectile dysfunction 01/02/2019   Diabetes mellitus (HCC) 06/27/2018   Advanced care planning/counseling discussion 06/16/2017   BPH (benign prostatic hyperplasia) 06/08/2016   Hyperlipemia 10/25/2015   Hypertension associated with diabetes (HCC) 10/25/2015    PCP: Crawford Givens MD  REFERRING PROVIDER: Crawford Givens MD  REFERRING DIAG: THERAPY DIAG: J19.147 (ICD-10-CM) - Left arm pain  Chronic left shoulder pain  Abnormal posture  Rationale for Evaluation and Treatment: Rehabilitation  ONSET DATE: ~ 1 year  SUBJECTIVE:  SUBJECTIVE STATEMENT: Pt reports continuous improvement in symptoms.  Hand dominance: Right  PERTINENT HISTORY: Pt reports gradual LUE pain for about 1 year. Symptoms occur only with sleeping on L side. Pain refers to finger tips. All 5 fingers also tingle. Symptoms are only reproduced with sleeping on his L side. Easily subsides in ~ 2 minutes. Has remained consistent, has not improved but also has not worsened. Pain quality is a dull ache, rates it about a 2/10 NPS. Sensation of his arm falling asleep in his hand. Has tried abducting arm with pillow with no  success. Denies cervical motion or LUE use reproducing his symptoms. B/B habits normal, denies any saddle anesthesia. Still works part name, mostly desk work.   PAIN:  Are you having pain? No  PRECAUTIONS: None  RED FLAGS: None   WEIGHT BEARING RESTRICTIONS: No  FALLS:  Has patient fallen in last 6 months? 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  DIAGNOSTIC FINDINGS:  CLINICAL DATA:  Left arm pain   EXAM: CERVICAL SPINE - COMPLETE 4+ VIEW   COMPARISON:  None Available.   FINDINGS: Alignment within normal limits. Vertebral body heights are maintained. Moderate disc space narrowing at C3-C4 with advanced disc space narrowing and degenerative change C5-C6 and C6-C7. Lateral masses are within normal limits. Carotid vascular calcifications. 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     Electronically Signed   By: Jasmine Pang M.D.   On: 02/20/2023 01:56   PATIENT SURVEYS :  [name] deferred to next session  COGNITION: Overall cognitive status: Within functional limits for tasks assessed     SENSATION: Light touch: WFL  POSTURE: Rounded shoulders, mild forward head posture  Cervical AROM:   Extension: 67 deg; 04/28/23: 80 deg Flexion: 60 deg; 70 deg Rotation R/L: 55/65 deg; 70/60 Lateral flexion R/L: 30/30 deg; 35/45  UPPER EXTREMITY ROM:   Active ROM Right eval Left eval Left  04/28/23  Shoulder flexion 180 165 170  Shoulder extension     Shoulder abduction 180 180   Shoulder adduction     Shoulder internal rotation 90 90   Shoulder external rotation 90 80 90  Elbow flexion     Elbow extension     Wrist flexion     Wrist extension     Wrist ulnar deviation     Wrist radial deviation     Wrist  pronation     Wrist supination     (Blank rows = not tested)  UPPER EXTREMITY MMT:  MMT Right eval Left eval  Shoulder flexion    Shoulder extension    Shoulder abduction    Shoulder adduction    Shoulder internal rotation    Shoulder external rotation    Middle trapezius    Lower trapezius    Elbow flexion    Elbow extension    Wrist flexion    Wrist extension    Wrist ulnar deviation    Wrist radial deviation    Wrist pronation    Wrist supination    Grip strength (lbs)    (Blank rows = not tested)  SHOULDER SPECIAL TESTS: Not indicated   CERVICAL SPECIAL TESTS: Spurling's A and B: negative  ULTT for ulnar, median, and radial nerve all negative   ELBOW SPECIAL TESTS: Tinel's  sign: negative   WRIST SPECIAL TESTS: Apley's: negative   JOINT MOBILITY TESTING:  Normal GHJ mobility in AP/PA and inferior  L first rib with normal mobility. Does not reproduce symptoms.  C2-C7, T1-T8 CPA's normal joint mobility and painless  PALPATION:  Not TTP along any RTC musculature or cervical spinal processes or paraspinals. No TTP along pec minor. Does not reproduce any radicular or referred symptoms.  FUNCTIONAL TESTS:  Sleep posture. Pt sleeps with cervical spine/head in neutral position with entire spine. LUE placed in abduction and excessive L shoulder ER.                                                                                                                            TREATMENT DATE: 04/28/23  There.ex:   Reviewed goals. See goals section and clinical impression for details.    Updated HEP for pt. Discussed long term management of condition with regards to reps/sets/frequency. Pt verbalized understanding.   PATIENT EDUCATION: Education details: HEP, POC, sleeping posture adjustments Person educated: Patient Education method: Explanation, Demonstration, and Handouts Education comprehension: verbalized understanding and needs further education  HOME EXERCISE  PROGRAM: Access Code: [email or web address] URL: [email or web address] Date: 04/28/2023 Prepared by: Ronnie Derby  Exercises - Prone Chest Stretch on Chair  - 1 x daily - 3-4 x weekly - 1 sets - 3 reps - 30 hold - Seated Upper Trapezius Stretch  - 1 x daily - 3-4 x weekly - 1 sets - 3 reps - 30 hold - Supine Shoulder Flexion AAROM with Hands Clasped  - 1 x daily - 7 x weekly - 1 sets - 3 reps - 30 hold - Wall Angels  - 1 x daily - 3-4 x weekly - 3 sets - 8 reps - Shoulder External Rotation and Scapular Retraction with Resistance  - 1 x daily - 3-4 x weekly - 3 sets - 8 reps - Seated Assisted Cervical Rotation with Towel  - 1 x daily - 3-4 x weekly - 1 sets - 12 reps - Seated Thoracic Lumbar Extension  - 1 x daily - 3-4 x weekly - 2 sets - 13 reps  Access Code: [email or web address] URL: [email or web address] Date: 04/07/2023 Prepared by: Ronnie Derby  Exercises - Prone Chest Stretch on Chair  - 1 x daily - 7 x weekly - 1 sets - 3 reps - 30 hold - Seated Upper Trapezius Stretch  - 1 x daily - 7 x weekly - 1 sets - 3 reps - 30 hold - Supine Shoulder Flexion AAROM with Hands Clasped  - 1 x daily - 7 x weekly - 1 sets - 3 reps - 30 hold  ASSESSMENT:  CLINICAL IMPRESSION: Pt endorsing complete resolution of symptoms today. Agreeable for goals re-assessment and discharge. Pt has vastly improved in overall cervical AROM and L shoulder flexion. Remains with mild deficits in L cervical rotation and R lateral flexion due to L upper trap tightness. Educated pt on importance of continuous  improvement at home to ensure long term relief of LUE N/T in case partial issue to begin with was cervical related. Otherwise pt has been compliant with his HEP demonstrating self management of symptoms and able to sleep full night without symptoms as well. Provided updated HEP for postural re-ed, spinal mobility, and periscapular strengthening to ensure continued relief of symptoms. Pt has met all goals and is  formally discharged from PT at this time.    OBJECTIVE IMPAIRMENTS: decreased mobility, decreased ROM, and postural dysfunction.   ACTIVITY LIMITATIONS: sleeping  PARTICIPATION LIMITATIONS:  None  PERSONAL FACTORS: Age, Past/current experiences, and Time since onset of injury/illness/exacerbation are also affecting patient's functional outcome.   REHAB POTENTIAL: Fair Chronic condition with inability to reproduce symptoms  CLINICAL DECISION MAKING: Stable/uncomplicated  EVALUATION COMPLEXITY: Low  GOALS: Goals reviewed with patient? No  SHORT TERM GOALS: Target date: 04/21/23  Pt will be independent with HEP to improve LUE pain and N/T with sleep. Baseline:04/07/23: provided HEP; 04/28/23: 100% compliant Goal status: MET  LONG TERM GOALS: Target date: 05/05/23  Pt will improve cervical and L shoulder AROM to normal limits to improve joint mobility and flexibility for improve nerve pathway mobility.  Baseline: 04/07/23: see graph above for AROM measurements; 04/28/23: See graph above. Goal status: Partially met  2.  Pt will report return to full night's sleep without reproduction of LUE symptoms to demonstrate return to PLOF.  Baseline: 04/07/23: sleeping on L side causes LUE pain and N/T; 04/28/23: full night's sleep without symptoms. Goal status: MET  PLAN: PT FREQUENCY: 1x/week  PT DURATION: 4 weeks  PLANNED INTERVENTIONS: 97164- PT Re-evaluation, 97110-Therapeutic exercises, 97530- Therapeutic activity, 97112- Neuromuscular re-education, 97535- Self Care, 40981- Manual therapy, 97014- Electrical stimulation (unattended), [phone]- Electrical stimulation (manual), Patient/Family education, Dry Needling, Joint mobilization, Joint manipulation, Spinal manipulation, Spinal mobilization, Cryotherapy, and Moist heat  PLAN FOR NEXT SESSION: Discharged.    Delphia Grates. Fairly IV, PT, DPT Physical Therapist-   [facility]  04/28/2023, 8:47 AM

## 2023-05-05 ENCOUNTER — Ambulatory Visit: Payer: Medicare Other

## 2023-05-11 DIAGNOSIS — M65311 Trigger thumb, right thumb: Secondary | ICD-10-CM | POA: Diagnosis not present

## 2023-05-25 DIAGNOSIS — Z23 Encounter for immunization: Secondary | ICD-10-CM | POA: Diagnosis not present

## 2023-06-03 ENCOUNTER — Ambulatory Visit (INDEPENDENT_AMBULATORY_CARE_PROVIDER_SITE_OTHER): Payer: Medicare Other | Admitting: Internal Medicine

## 2023-06-03 ENCOUNTER — Encounter: Payer: Self-pay | Admitting: Internal Medicine

## 2023-06-03 VITALS — BP 120/70 | HR 61 | Ht 69.5 in | Wt 185.4 lb

## 2023-06-03 DIAGNOSIS — E1159 Type 2 diabetes mellitus with other circulatory complications: Secondary | ICD-10-CM | POA: Diagnosis not present

## 2023-06-03 DIAGNOSIS — E785 Hyperlipidemia, unspecified: Secondary | ICD-10-CM | POA: Diagnosis not present

## 2023-06-03 DIAGNOSIS — Z794 Long term (current) use of insulin: Secondary | ICD-10-CM | POA: Diagnosis not present

## 2023-06-03 LAB — POCT GLYCOSYLATED HEMOGLOBIN (HGB A1C): Hemoglobin A1C: 5.5 % (ref 4.0–5.6)

## 2023-06-03 NOTE — Progress Notes (Signed)
 Patient ID: Luis Wise, male   DOB: [birth year], 83 y.o.   MRN: [id number]   HPI: [name] is a 83 y.o.-year-old male, initially referred by his PCP, Dr. Para March, returning for follow-up: DM2, dx in 2004-2005, insulin-dependent, uncontrolled, with complications (CAD - s/p CABG x5, CKD).  Last visit 4 months ago.  He is here with his wife, who is a former Nurse, learning disability and offers part of the history especially related to his medication regimen, blood sugars, and activity.  Interim history: No increased urination, blurry vision, nausea, chest pain.  He feels well and has good energy.  Insulin pump: - Omnipod 5 >> $$  - t:slim X2 - cheaper for him as it is considered durable medical equipment as opposed to the OmniPod pump which goes to his prescription benefits - started mid 08/2022 He had a pump failure 02/2023 but got a replacement immediately.  CGM: - Dexcom G6 >> G7  Insulin: - Humalog U200    Reviewed HbA1c levels: Lab Results  Component Value Date   HGBA1C 5.8 (A) 01/28/2023   HGBA1C 6.2 (A) 09/25/2022   HGBA1C 6.1 (A) 07/02/2022   HGBA1C 5.9 (A) 03/11/2022   HGBA1C 6.4 01/08/2022   HGBA1C 6.2 (A) 11/06/2021   HGBA1C 6.5 (A) 08/07/2021   HGBA1C 7.5 (A) 04/03/2021   HGBA1C 7.7 (H) 01/08/2021   HGBA1C 7.2 (A) 11/27/2020   Patient was on a regimen of: - Metformin 500 mg in am and 1000 mg in pm >> 1500 mg at dinnertime.  Mom - Marcelline Deist 5 >> 10 mg before breakfast-added 03/2020 - Glimepiride 4 mg in am - NovoLog (16)-(16)-(14-16) before B-L-D - Tresiba 10 >> 26 >> 30 >> 32 >> 30 units daily-added 06/2020 >> now 24 units He was also tried on Rybelsus but this was expensive.  He then tried Ozempic but he developed nausea and vomiting and then increase lipase on 02/27/2020 (82  >> 119) on the 0.5 mg dose.  Ozempic was stopped at that time. Prev. On Januvia >> tolerated well but expensive.  Currently on: - Farxiga 10 mg before b'fast - Metformin 1500 mg with dinner >> 1000 mg  in am  - Insulin pump: - Basal rates: 12 am: 2 units/h    5 pm: 2 >> 1.7 - Insulin to carb ratio: 12 am: 1:8 6 am: 1:7  12 pm: 1:7 >> 1:6 5 pm: 1:8 >> 1:9 - Target: 12 am: 110 - Correction factor (insulin sensitivity factor):  12 am: 70  Total daily dose from basal insulin: 24 units >> 22.6 units (44%) >> 41% >> 63% >> 60% Total daily dose from bolus insulin: 25-36 units >> 28.5 units (56%) >> 37% >> 40% TDD: 63-100 units  Pt checks his sugars >4x a day:  Prev.:  Previously:    Lowest sugar was 70s >> 60 >> 63 >> 56; he has hypoglycemia awareness in the 60s.  Highest sugar was 300 >> 200s >> 274.  Glucometer: OneTouch ultra  Pt's meals are: - Breakfast: 1 egg + toast or cereal or oatmeal - Lunch: grilled fish + veggies - Dinner: meat + veggies - Snacks: 2: nuts, wheat crackers He walks 3 miles 2-3 days a week for exercise.  He finished cardiac rehab.   -No CKD, last BUN/creatinine:  Lab Results  Component Value Date   BUN 17 02/02/2023   BUN 18 01/08/2022   CREATININE 1.16 02/02/2023   CREATININE 1.13 01/08/2022   Lab Results  Component Value Date  MICRALBCREAT 14.0 01/28/2023   MICRALBCREAT <30 06/24/2018  On lisinopril 20 twice a day.  -+ HL; last set of lipids: Lab Results  Component Value Date   CHOL 91 02/02/2023   HDL 23.20 (L) 02/02/2023   LDLCALC 49 02/02/2023   LDLDIRECT 35.0 01/08/2021   TRIG 98.0 02/02/2023   CHOLHDL 4 02/02/2023  On atorvastatin 80 mg daily, fenofibrate 160, co-Q10  On ASA 81.  - last eye exam was 08/04/2022: No DR.  - no numbness and tingling in his feet.  Last foot exam 01/28/2023.  Pt has FH of DM in father in his 18s.  He was taken off amiodarone.    ROS: + See HPI  I reviewed pt's medications, allergies, PMH, social hx, family hx, and changes were documented in the history of present illness. Otherwise, unchanged from my initial visit note.  Past Medical History:  Diagnosis Date   Allergy     Amoxicillin   Cataract 2017   Mild progression   Coronary artery disease    a. s/p 5-vessel CABG (LIMA-LAD, SVG-diag, sequential SVG-OM1-OM 2, & SVG-PDA)   Diabetes mellitus with complication (HCC)    Hyperlipidemia    Hypertension    Insomnia    neuropathy    bilateral feet   Postoperative atrial fibrillation [facility])    Past Surgical History:  Procedure Laterality Date   CARDIAC CATHETERIZATION     01/19/2020   COLONOSCOPY WITH PROPOFOL N/A 04/01/2021   Procedure: COLONOSCOPY WITH PROPOFOL;  Surgeon: Midge Minium, MD;  Location: [facility] ENDOSCOPY;  Service: Endoscopy;  Laterality: N/A;   CORONARY ARTERY BYPASS GRAFT N/A 01/29/2020   Procedure: CORONARY ARTERY BYPASS GRAFTING (CABG) TIMES FIVE USING LIMA to LAD; ENDOSCOPIC HARVESTED GREATER SAPHENOUS VEIN: SVG to PDA; SVG to sequenced OM1 & OM2.;  Surgeon: Kerin Perna, MD;  Location: [facility] OR;  Service: Open Heart Surgery;  Laterality: N/A;   ENDOVEIN HARVEST OF GREATER SAPHENOUS VEIN Bilateral 01/29/2020   Procedure: ENDOVEIN HARVEST OF GREATER SAPHENOUS VEIN;  Surgeon: Kerin Perna, MD;  Location: [facility] OR;  Service: Open Heart Surgery;  Laterality: Bilateral;   EYE SURGERY  Radial K   1997   LAMINECTOMY  1993   L5-S1   LEFT HEART CATH AND CORONARY ANGIOGRAPHY N/A 01/19/2020   Procedure: LEFT HEART CATH AND CORONARY ANGIOGRAPHY;  Surgeon: Iran Ouch, MD;  Location: [facility] INVASIVE CV LAB;  Service: Cardiovascular;  Laterality: N/A;   MENISCUS REPAIR  1996   TEE WITHOUT CARDIOVERSION N/A 01/29/2020   Procedure: TRANSESOPHAGEAL ECHOCARDIOGRAM (TEE);  Surgeon: Donata Clay, Theron Arista, MD;  Location[facility] OR;  Service: Open Heart Surgery;  Laterality: N/A;   TONSILLECTOMY     Social History   Socioeconomic History   Marital status: Married    Spouse name: Not on file   Number of children: Not on file   Years of education: Not on file   Highest education level: Master's degree (e.g., MA, MS, MEng, MEd, MSW, MBA)  Occupational History    Occupation: CFO  Tobacco Use   Smoking status: Never   Smokeless tobacco: Never  Vaping Use   Vaping status: Never Used  Substance and Sexual Activity   Alcohol use: Not Currently    Alcohol/week: 0.0 standard drinks of alcohol    Comment: Very rare- occasionaly beer or wine    Drug use: No   Sexual activity: Yes    Birth control/protection: None  Other Topics Concern   Not on file  Social History Narrative  Married 1970.   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   Social Drivers of Corporate investment banker Strain: Low Risk  (02/05/2023)   Overall Financial Resource Strain (CARDIA)    Difficulty of Paying Living Expenses: Not hard at all  Food Insecurity: No Food Insecurity (02/05/2023)   Hunger Vital Sign    Worried About Running Out of Food in the Last Year: Never true    Ran Out of Food in the Last Year: Never true  Transportation Needs: No Transportation Needs (02/05/2023)   PRAPARE - Administrator, Civil Service (Medical): No    Lack of Transportation (Non-Medical): No  Physical Activity: Sufficiently Active (02/05/2023)   Exercise Vital Sign    Days of Exercise per Week: 3 days    Minutes of Exercise per Session: 50 min  Stress: No Stress Concern Present (02/05/2023)   [employer] of Occupational Health - Occupational Stress Questionnaire    Feeling of Stress : Not at all  Social Connections: Socially Integrated (02/05/2023)   Social Connection and Isolation Panel [NHANES]    Frequency of Communication with Friends and Family: More than three times a week    Frequency of Social Gatherings with Friends and Family: Once a week    Attends Religious Services: More than 4 times per year    Active Member of [employer] or Organizations: Yes    Attends Engineer, structural: More than 4 times per  year    Marital Status: Married  Catering manager Violence: Not At Risk (12/22/2021)   Humiliation, Afraid, Rape, and Kick questionnaire    Fear of Current or Ex-Partner: No    Emotionally Abused: No    Physically Abused: No    Sexually Abused: No   Current Outpatient Medications on File Prior to Visit  Medication Sig Dispense Refill   amLODipine (NORVASC) 10 MG tablet Take 1 tablet (10 mg total) by mouth daily. 90 tablet 3   atorvastatin (LIPITOR) 80 MG tablet TAKE 1 TABLET BY MOUTH EVERY DAY 90 tablet 2   carvedilol (COREG) 12.5 MG tablet TAKE 1 TABLET BY MOUTH 2 TIMES DAILY. 180 tablet 2   cholecalciferol (VITAMIN D3) 25 MCG (1000 UNIT) tablet Take 1,000 Units by mouth daily.     Coenzyme Q10 (COQ10) 100 MG CAPS Take 100 mg by mouth daily.      Continuous Blood Gluc Transmit (DEXCOM G6 TRANSMITTER) MISC 1 Device by Does not apply route every 3 (three) months. E11.65 1 each 3   dapagliflozin propanediol (FARXIGA) 10 MG TABS tablet TAKE 1 TABLET BY MOUTH EVERY DAY BEFORE BREAKFAST 90 tablet 3   fenofibrate 160 MG tablet Take 1 tablet (160 mg total) by mouth daily. 90 tablet 3   glucose blood (ONETOUCH ULTRA) test strip Use to check blood sugar daily. Dx E11.9 200 strip 3   GVOKE HYPOPEN 1-PACK 1 MG/0.2ML SOAJ Inject 0.2 mg under skin as needed for low blood sugars 0.2 mL prn   Insulin Disposable Pump (OMNIPOD 5 G6 PODS, GEN 5,) MISC USE AS INSTRUCTED TO ADMINISTER INSULIN 30 each 2   insulin glargine-yfgn (SEMGLEE) 100 UNIT/ML Pen INJECT 25 UNITS INTO THE SKIN DAILY. 45 mL 1   insulin lispro (HUMALOG KWIKPEN) 100 UNIT/ML KwikPen Use when pump malfunctions Bolus 1: 80 insulin to carb ratio. 15 mL 2   insulin lispro (  HUMALOG KWIKPEN) 200 UNIT/ML KwikPen USE AS INSTRUCTED IN THE INSULIN PUMP MAX DAILY 145 UNITS 72 mL 3   Insulin Pen Needle 32G X 4 MM MISC Use 4x a day 300 each 3   metFORMIN (GLUCOPHAGE) 1000 MG tablet Take 1 tablet (1,000 mg total) by mouth daily. In the morning     Multiple  Vitamins-Minerals (PRESERVISION AREDS 2 PO) Take 1 tablet by mouth 2 (two) times daily.      Omega-3 Fatty Acids (FISH OIL) 1000 MG CAPS Take 1-2 capsules (1,000-2,000 mg total) by mouth in the morning and at bedtime.  0   sildenafil (VIAGRA) 100 MG tablet Take 1 tablet (100 mg total) by mouth as needed for erectile dysfunction. 30 tablet 6   traZODone (DESYREL) 150 MG tablet Take 1 tablet (150 mg total) by mouth daily. 90 tablet 3   valsartan (DIOVAN) 160 MG tablet TAKE 1 TABLET BY MOUTH EVERY DAY 90 tablet 3   Zinc Oxide 15 MG TBDP 2 tabs per day     No current facility-administered medications on file prior to visit.   Allergies  Allergen Reactions   Ozempic (0.25 Or 0.5 Mg-Dose) [Semaglutide(0.25 Or 0.5mg -Dos)]     Presumed cause of GI upset.     Amoxil [Amoxicillin] Rash   Family History  Problem Relation Age of Onset   Hypertension Mother    Diabetes Father    Cancer Neg Hx    COPD Neg Hx    Heart disease Neg Hx    Hyperlipidemia Neg Hx    Stroke Neg Hx    Colon cancer Neg Hx    Prostate cancer Neg Hx    PE: BP 120/70   Pulse 61   Ht 5' 9.5" (1.765 m)   Wt 185 lb 6.4 oz (84.1 kg)   SpO2 96%   BMI 26.99 kg/m  Wt Readings from Last 3 Encounters:  06/03/23 185 lb 6.4 oz (84.1 kg)  02/09/23 184 lb 12.8 oz (83.8 kg)  01/28/23 184 lb 6.4 oz (83.6 kg)   Constitutional: normal weight, in NAD Eyes: EOMI, no exophthalmos ENT: no thyromegaly, no cervical lymphadenopathy Cardiovascular: RRR, No MRG Respiratory: CTA B Musculoskeletal: no deformities Skin:  no rashes Neurological: no tremor with outstretched hands  ASSESSMENT: 1. DM, insulin-dependent, uncontrolled, with complications - CAD, s/p CABG x5 - CKD  He had difficulty obtaining the t:slim X2 insulin pump due to lack of insulin deficiency.  However, his anti-insulin antibodies were elevated: Component     Latest Ref Rng 05/25/2022 06/12/2022  Glutamic Acid Decarb Ab     <5 IU/mL  <5   IA-2 Antibody     <5.4  U/mL  <5.4   Insulin Antibodies, Human     <0.4 U/mL  3.2 (H)   Glucose     65 - 99 mg/dL 409 (H)    C-Peptide     0.80 - 3.85 ng/mL 1.49     Component     Latest Ref Rng & Units 08/29/2020          Glucose     65 - 99 mg/dL 811 (H)  Hemoglobin B1Y     4.0 - 5.6 % 7.5 (A)  C-Peptide     0.80 - 3.85 ng/mL 4.30 (H)  Islet Cell Ab     Neg:<1:1 Negative  ZNT8 Antibodies     <15 U/mL <10  Glutamic Acid Decarb Ab     <5 IU/mL <5  No insulin deficiency or pancreatic autoimmunity.  He has good insulin production.  Of note, we cannot use a GLP-1 receptor agonist for him due to previously elevated lipase.  2. HL  PLAN:  1. Patient with longstanding, well-controlled insulin-dependent diabetes on the t:slim X2 insulin pump changed in 08/2022 from OmniPod 5 pump due to price.  His pump is integrated with the Dexcom G7 CGM.  His HbA1c at last visit was excellent, lower, at 5.8%.  At that time, sugars appears to be slightly improved, mostly fluctuating within the target range with the exception of a higher blood sugar after lunch and dinner more consistently but with later drops in blood sugars after dinner.  We moved his metformin from dinnertime to breakfast and reduced the dose.  To avoid dropping his sugars too much overnight, I advised him to decrease his basal rate after 8 PM.  We also relaxed his insulin to carb ratio with dinner and strengthen his ICR with lunch as sugars were higher after this meal despite entering carbs correctly into the pump and bolusing before the meal. -He is also on an SGLT2 inhibitor as he has a history of CAD and CKD.  He tolerates this well. CGM interpretation: -At today's visit, we reviewed his CGM downloads: It appears that 91% of values are in target range (goal >70%), while 8% are higher than 180 (goal <25%), and 1% are lower than 70 (goal <4%).  The calculated average blood sugar is 124.  The projected HbA1c for the next 3 months (GMI) is 6.3%. -Reviewing  the CGM trends, sugars are a little better controlled c/w last OV, with most sugars at goal, except for mild hyperglycemic exceptions after b'fast and dinner.  However, he does complain about occasional low blood sugars especially around 3 AM.  The sugars do appear to drop occasionally too low especially after high blood sugars after dinner.  Reviewing the pump downloads, he does not appear to bolus for correction and the pump does not appear to give him correction boluses, so I suspect that the drop in blood sugars in such instances related to the excessively high basal rates that he has overnight.  I advised him to reduce the basal rate between 12 AM and 6 AM. -Also, reviewing the pump downloads, he is dropping his blood sugars slightly after almost each bolus, and upon questioning, he is taking Humalog approximately 20 minutes before each meal.  I advised him to move the bolus closer to the meal, 10 to 15 minutes in advance. -Otherwise, I do not feel we need to change his regimen - I suggested to:  Patient Instructions  Please continue: - Farxiga 10 mg before b'fast - Metformin 1000 mg in am  Use the following pump settings: - Basal rates: 12 am: 2 >> 1.8 units/h 6 am: 2.0    5 pm: 1.7 - Insulin to carb ratio: 12 am: 1:8 6 am: 1:7  12 pm: 1:6 5 pm: 1:9 - Target: 12 am: 110 - Correction factor (insulin sensitivity factor):  12 am: 70  Start the boluses 10-15 min before meals.  Please return in 4-6 months.  - we checked his HbA1c: 5.5% (lower) - advised to check sugars at different times of the day - 4x a day, rotating check times - advised for yearly eye exams >> he is UTD - return to clinic in 4-6 months (however, to obtain his supplies, he needs to come every 4 months)  2. HL -Latest lipid panel was reviewed from 01/2023: Fractions at goal with  the exception of a low HDL: Lab Results  Component Value Date   CHOL 91 02/02/2023   HDL 23.20 (L) 02/02/2023   LDLCALC 49  02/02/2023   LDLDIRECT 35.0 01/08/2021   TRIG 98.0 02/02/2023   CHOLHDL 4 02/02/2023  -he is on Lipitor 80 mg daily and fenofibrate 160 mg daily without side effects  Carlus Pavlov, MD PhD [facility]

## 2023-06-03 NOTE — Patient Instructions (Addendum)
 Please continue: - Farxiga 10 mg before b'fast - Metformin 1000 mg in am  Use the following pump settings: - Basal rates: 12 am: 2 >> 1.8 units/h 6 am: 2.0    5 pm: 1.7 - Insulin to carb ratio: 12 am: 1:8 6 am: 1:7  12 pm: 1:6 5 pm: 1:9 - Target: 12 am: 110 - Correction factor (insulin sensitivity factor):  12 am: 70  Start the boluses 10-15 min before meals.  Please return in 4-6 months.

## 2023-06-08 ENCOUNTER — Other Ambulatory Visit: Payer: Self-pay | Admitting: Family Medicine

## 2023-06-08 ENCOUNTER — Other Ambulatory Visit: Payer: Self-pay | Admitting: Cardiovascular Disease

## 2023-06-08 NOTE — Telephone Encounter (Signed)
 LOV: 02/09/23  NOV: not scheduled   Last refill: 02/09/23 #90 w/ 3 refills   Medication is marked as discontinued, Please advise

## 2023-06-09 NOTE — Telephone Encounter (Signed)
 I denied this.  He has rx for 150mg  trazodone.  Thanks.

## 2023-06-12 ENCOUNTER — Other Ambulatory Visit: Payer: Self-pay | Admitting: Family Medicine

## 2023-06-18 ENCOUNTER — Other Ambulatory Visit: Payer: Self-pay | Admitting: Internal Medicine

## 2023-07-01 ENCOUNTER — Other Ambulatory Visit: Payer: Self-pay | Admitting: Cardiovascular Disease

## 2023-07-22 DIAGNOSIS — L570 Actinic keratosis: Secondary | ICD-10-CM | POA: Diagnosis not present

## 2023-07-22 DIAGNOSIS — L820 Inflamed seborrheic keratosis: Secondary | ICD-10-CM | POA: Diagnosis not present

## 2023-07-25 ENCOUNTER — Other Ambulatory Visit: Payer: Self-pay | Admitting: Cardiovascular Disease

## 2023-07-26 ENCOUNTER — Telehealth: Payer: Self-pay | Admitting: Family Medicine

## 2023-07-26 ENCOUNTER — Encounter: Payer: Self-pay | Admitting: Family Medicine

## 2023-07-26 NOTE — Telephone Encounter (Signed)
 See MyChart message and please triage patient about his cough.  Thanks.

## 2023-07-27 ENCOUNTER — Encounter: Payer: Self-pay | Admitting: General Practice

## 2023-07-27 ENCOUNTER — Ambulatory Visit (INDEPENDENT_AMBULATORY_CARE_PROVIDER_SITE_OTHER): Admitting: General Practice

## 2023-07-27 VITALS — BP 132/72 | HR 62 | Temp 98.7°F | Ht 69.5 in | Wt 178.0 lb

## 2023-07-27 DIAGNOSIS — R059 Cough, unspecified: Secondary | ICD-10-CM

## 2023-07-27 MED ORDER — AZITHROMYCIN 250 MG PO TABS: ORAL_TABLET | ORAL | 0 refills | Status: AC

## 2023-07-27 MED ORDER — BENZONATATE 200 MG PO CAPS: 200.0000 mg | ORAL_CAPSULE | Freq: Three times a day (TID) | ORAL | 0 refills | Status: DC | PRN

## 2023-07-27 NOTE — Telephone Encounter (Signed)
 I spoke with pt; starting sometime last wk pt had cough; now productive cough with brown phlegm; no fever, CP,SOB or wheezing. Pt scheduled appt with Loreli Rogue NP on 07/27/23 at 10:20 with UC & ED precautions and pt voiced understanding. Sending note to Loreli Rogue NP and Dr Vallarie Gauze as PCP.

## 2023-07-27 NOTE — Assessment & Plan Note (Signed)
 Stable for outpatient treatment.  Exam stable. Lungs clear.   Suspect bacterial involvement given the length of his symptoms.   Will cover for walking pneumonia given age.  Start Azithromycin  antibiotics for infection. Take 2 tablets by mouth today, then 1 tablet daily for 4 additional days.  Start Benzonatate  capsules for cough. Take 1 capsule by mouth three times daily as needed for cough.  Consider chest xray if not better.   ER precautions provided.  Encourage rest, increase fluid intake, OTC medication for symptom management.

## 2023-07-27 NOTE — Patient Instructions (Addendum)
 Start Azithromycin  antibiotics for infection. Take 2 tablets by mouth today, then 1 tablet daily for 4 additional days.  Start Benzonatate  capsules for cough. Take 1 capsule by mouth three times daily as needed for cough.  Please follow up if symptoms are worse or if you develop new symptoms. Please update me by Friday.  You can try a few things over the counter to help with your symptoms including:  Cough: Delsym or Robitussin (get the off brand, works just as well) Chest Congestion: Mucinex (plain) Nasal Congestion/Ear Pressure/Sinus Pressure: Try using Flonase (fluticasone) nasal spray. Instill 1 spray in each nostril twice daily. This can be purchased over the counter. Body aches, fevers, headache: Ibuprofen (not to exceed 2400 mg in 24 hours) or Acetaminophen -Tylenol  (not to exceed 3000 mg in 24 hours) Runny Nose/Throat Drainage/Sneezing/Itchy or Watery Eyes: An antihistamine such as Zyrtec, Claritin, Xyzal, Allegra  It was a pleasure meeting you!

## 2023-07-27 NOTE — Telephone Encounter (Signed)
 Noted. Thanks.

## 2023-07-27 NOTE — Progress Notes (Signed)
 Established Patient Office Visit  Subjective   Patient ID: Luis Wise, male    DOB: 1940/04/05  Age: 83 y.o. MRN: [id number]  Chief Complaint  Patient presents with   Cough    Since 07/18/23; is now productive with brown mucus. Patient has been taking dayquil and nyquil for cough along with one medication for diabetics.     Cough Pertinent negatives include no chest pain, chills, ear pain, fever, headaches, heartburn, sore throat, shortness of breath or wheezing.    Luis Wise is a 83 year old male, patient of Dr. Vallarie Gauze, with past medical history of HTN, type 2 DM, HLD, s/p CABGx5, Afib and CAD presents today for an acute visit.   Cough: symptom onset was 07/18/23 with head congestion and a cough. Cough is a productive cough with brown mucus. Cough is throughout the day. Denies any chest tightness shortness of breath, difficulty breathing, cough, fever, chills, nausea, sinus pain, sore throat, ear pain.   Has tried dayquil and nyquil for cough with some relief.   No hx of smoking or asthma.  UTD on pneumonia and influenza vaccine.  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 s/p 5-vessel CABG (LIMA-LAD, SVG-diag, sequential SVG-OM1-OM 2, & SVG-PDA)   Diabetes mellitus with  complication (HCC)    Hyperlipidemia    Hypertension    Insomnia    neuropathy    bilateral feet   Postoperative atrial fibrillation (HCC)    Past Surgical History:  Procedure Laterality Date   CARDIAC CATHETERIZATION     01/19/2020   COLONOSCOPY WITH PROPOFOL  N/A 04/01/2021   Procedure: COLONOSCOPY WITH PROPOFOL ;  Surgeon: Marnee Sink, MD;  Location: [facility] ENDOSCOPY;  Service: Endoscopy;  Laterality: N/A;   CORONARY ARTERY BYPASS GRAFT N/A 01/29/2020   Procedure: CORONARY ARTERY BYPASS GRAFTING (CABG) TIMES FIVE USING LIMA to LAD; ENDOSCOPIC HARVESTED GREATER SAPHENOUS VEIN: SVG to PDA; SVG to sequenced OM1 & OM2.;  Surgeon: Heriberto London, MD;  Location: [facility] OR;  Service: Open Heart Surgery;  Laterality: N/A;   ENDOVEIN HARVEST OF GREATER SAPHENOUS VEIN Bilateral 01/29/2020   Procedure: ENDOVEIN HARVEST OF GREATER SAPHENOUS VEIN;  Surgeon: Heriberto London, MD;  Location: [facility] OR;  Service: Open Heart Surgery;  Laterality: Bilateral;   EYE SURGERY  Radial K   1997   LAMINECTOMY  1993   L5-S1   LEFT HEART CATH AND CORONARY ANGIOGRAPHY N/A 01/19/2020   Procedure: LEFT HEART CATH AND CORONARY ANGIOGRAPHY;  Surgeon: Wenona Hamilton, MD;  Location: [facility];  Service: Cardiovascular;  Laterality: N/A;   MENISCUS REPAIR  1996   TEE WITHOUT CARDIOVERSION N/A 01/29/2020   Procedure: TRANSESOPHAGEAL ECHOCARDIOGRAM (TEE);  Surgeon: Matt Song, Donata Fryer, MD;  Location: [facility] OR;  Service: Open  Heart Surgery;  Laterality: N/A;   TONSILLECTOMY     Allergies  Allergen Reactions   Ozempic  (0.25 Or 0.5 Mg-Dose) [Semaglutide (0.25 Or 0.5mg -Dos)]     Presumed cause of GI upset.     Amoxil [Amoxicillin] Rash         02/09/2023   12:06 PM 08/06/2022   10:09 AM 12/22/2021   12:11 PM  Depression screen PHQ 2/9  Decreased Interest 0 0 0  Down, Depressed, Hopeless 0 0 0  PHQ - 2 Score 0 0 0  Altered sleeping 0 0 1  Tired, decreased energy 0 0 0  Change in appetite 0 0 0  Feeling bad or  failure about yourself  0 0 0  Trouble concentrating 0 0 0  Moving slowly or fidgety/restless 0 0 0  Suicidal thoughts 0 0   PHQ-9 Score 0 0 1  Difficult doing work/chores Not difficult at all Not difficult at all Not difficult at all       02/09/2023   12:06 PM 08/06/2022   10:09 AM  GAD 7 : Generalized Anxiety Score  Nervous, Anxious, on Edge 0 0  Control/stop worrying 0 0  Worry too much - different things 0 0  Trouble relaxing 0 0  Restless 0 0  Easily annoyed or irritable 0 0  Afraid - awful might happen 0 0  Total GAD 7 Score 0 0  Anxiety Difficulty Not difficult at all Not difficult at all      Review of Systems  Constitutional:  Negative for chills and fever.  HENT:  Negative for congestion, ear pain, sinus pain and sore throat.   Respiratory:  Positive for cough. Negative for shortness of breath, wheezing and stridor.   Cardiovascular:  Negative for chest pain.  Gastrointestinal:  Negative for abdominal pain, constipation, diarrhea, heartburn, nausea and vomiting.  Genitourinary:  Negative for dysuria, frequency and urgency.  Neurological:  Negative for dizziness and headaches.  Endo/Heme/Allergies:  Negative for polydipsia.  Psychiatric/Behavioral:  Negative for depression and suicidal ideas. The patient is not nervous/anxious.       Objective:     BP 132/72 (BP Location: Left Arm, Patient Position: Sitting, Cuff Size: Normal)   Pulse 62   Temp 98.7 F (37.1 C) (Oral)   Ht 5' 9.5" (1.765 m)   Wt 178 lb (80.7 kg)   SpO2 95%   BMI 25.91 kg/m  BP Readings from Last 3 Encounters:  07/27/23 132/72  06/03/23 120/70  02/09/23 (!) 140/72   Wt Readings from Last 3 Encounters:  07/27/23 178 lb (80.7 kg)  06/03/23 185 lb 6.4 oz (84.1 kg)  02/09/23 184 lb 12.8 oz (83.8 kg)      Physical Exam Vitals and nursing note reviewed.  Constitutional:      Appearance: Normal appearance.  HENT:     Right Ear: Tympanic membrane, ear canal and external ear normal.      Left Ear: Tympanic membrane, ear canal and external ear normal.     Nose: Nose normal.     Mouth/Throat:     Mouth: Mucous membranes are moist.     Pharynx: Posterior oropharyngeal erythema present.  Eyes:     Conjunctiva/sclera: Conjunctivae normal.  Cardiovascular:     Rate and Rhythm: Normal rate and regular rhythm.     Pulses: Normal pulses.     Heart sounds: Normal heart sounds.  Pulmonary:     Effort: Pulmonary effort is normal.     Breath sounds: Normal breath sounds.  Neurological:     Mental Status: He is alert and oriented to person, place, and time.  Psychiatric:        Mood and Affect: Mood normal.        Behavior: Behavior normal.        Thought Content: Thought content normal.        Judgment: Judgment normal.      No results found for any visits on 07/27/23.     The ASCVD Risk score (Arnett DK, et al., 2019) failed to calculate for the following reasons:   The 2019 ASCVD risk score is only valid for ages 61 to 28    Assessment & Plan:  Cough, unspecified type Assessment & Plan: Stable for outpatient treatment.  Exam stable. Lungs clear.   Suspect bacterial involvement given the length of his symptoms.   Will cover for walking pneumonia given age.  Start Azithromycin  antibiotics for infection. Take 2 tablets by mouth today, then 1 tablet daily for 4 additional days.  Start Benzonatate  capsules for cough. Take 1 capsule by mouth three times daily as needed for cough.  Consider chest xray if not better.   ER precautions provided.  Encourage rest, increase fluid intake, OTC medication for symptom management.  Orders: -     Azithromycin ; Take 2 tablets on day 1, then 1 tablet daily on days 2 through 5  Dispense: 6 tablet; Refill: 0 -     Benzonatate ; Take 1 capsule (200 mg total) by mouth 3 (three) times daily as needed.  Dispense: 20 capsule; Refill: 0     Return if symptoms worsen or fail to improve.    Jolanda Nation, NP

## 2023-08-03 ENCOUNTER — Encounter: Payer: Self-pay | Admitting: General Practice

## 2023-09-06 NOTE — Progress Notes (Unsigned)
 Cardiology Office Note  Date:  09/07/2023   ID:  Luis Wise, DOB 24-Jun-1940, MRN [id number]  PCP:  Luis Arlyss RAMAN, MD   Chief Complaint  Patient presents with   12 month follow up     Doing well.     HPI:  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 rate has decreased BY  53 BPM Confirmed by Luis Wise [id number]) on 09/07/2023 8:25:44 AM    Prior echo 9/23  1. Left ventricular ejection fraction, by estimation, is 45 to 50%. The  left ventricle has mildly decreased function. The left ventricle  demonstrates regional wall motion abnormalities (anteroseptal wall  hypokinesis, possibly secondary to post-operative   state). There is mild left ventricular hypertrophy. Left ventricular  diastolic parameters are consistent with Grade I diastolic  dysfunction  (impaired relaxation).   2. Right ventricular systolic function is normal. The right ventricular  size is normal.   3. The mitral valve is normal in structure. Mild mitral valve  regurgitation. No evidence of mitral stenosis.   4. The aortic valve is normal in structure. There is mild calcification  of the aortic valve. Aortic valve regurgitation is mild. Aortic valve  sclerosis is present, with no evidence of aortic valve stenosis.   5. The inferior vena cava is dilated in size with >50% respiratory  variability, suggesting right atrial pressure of 8 mmHg.   Comparison(s): 01/26/20-EF 55 to 60%  Past medical history reviewed Had three-vessel coronary disease on catheterization January 19, 2020 Chronic occlusion of the proximal to mid LAD, bridging collaterals Normal ejection fraction 55%  Echocardiogram ejection fraction 55%, Underwent CABG x5 January 29, 2020 (LIMA-LAD, SVG-diag, sequential SVG-OM1-OM 2, & SVG-PDA) developed atrial fibrillation which was treated with an amiodarone  bolus and drip. 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  PSH:    Past Surgical History:  Procedure Laterality Date   CARDIAC CATHETERIZATION     01/19/2020   COLONOSCOPY WITH PROPOFOL  N/A 04/01/2021   Procedure: COLONOSCOPY WITH PROPOFOL ;  Surgeon: Luis Carmine, MD;  Location: [facility] ENDOSCOPY;  Service: Endoscopy;  Laterality: N/A;   CORONARY ARTERY BYPASS GRAFT N/A  01/29/2020   Procedure: CORONARY ARTERY BYPASS GRAFTING (CABG) TIMES FIVE USING LIMA to LAD; ENDOSCOPIC HARVESTED GREATER SAPHENOUS VEIN: SVG to PDA; SVG to sequenced OM1 & OM2.;  Surgeon: Luis Hanford Coy, MD;  Location: [facility] OR;  Service: Open Heart  Surgery;  Laterality: N/A;   ENDOVEIN HARVEST OF GREATER SAPHENOUS VEIN Bilateral 01/29/2020   Procedure: ENDOVEIN HARVEST OF GREATER SAPHENOUS VEIN;  Surgeon: Luis Hanford Coy, MD;  Location: [facility] OR;  Service: Open Heart Surgery;  Laterality: Bilateral;   EYE SURGERY  Radial K   1997   LAMINECTOMY  1993   L5-S1   LEFT HEART CATH AND CORONARY ANGIOGRAPHY N/A 01/19/2020   Procedure: LEFT HEART CATH AND CORONARY ANGIOGRAPHY;  Surgeon: Luis Deatrice LABOR, MD;  Location: [facility];  Service: Cardiovascular;  Laterality: N/A;   MENISCUS REPAIR  1996   TEE WITHOUT CARDIOVERSION N/A 01/29/2020   Procedure: TRANSESOPHAGEAL ECHOCARDIOGRAM (TEE);  Surgeon: Luis Hanford, Coy, MD;  Location: [facility] OR;  Service: Open Heart Surgery;  Laterality: N/A;   TONSILLECTOMY      Current Outpatient Medications  Medication Sig Dispense Refill   amLODipine  (NORVASC ) 10 MG tablet Take 1 tablet (10 mg total) by mouth daily. 90 tablet 3   atorvastatin  (LIPITOR) 80 MG tablet TAKE 1 TABLET BY MOUTH EVERY DAY 90 tablet 1   benzonatate  (TESSALON ) 200 MG capsule Take 1 capsule (200 mg total) by mouth 3 (three) times daily as needed. 20 capsule 0   carvedilol  (COREG ) 12.5 MG tablet TAKE 1 TABLET BY MOUTH TWICE A DAY 180 tablet 1   cholecalciferol (VITAMIN D3) 25 MCG (1000 UNIT) tablet Take 1,000 Units by mouth daily.     Coenzyme Q10 (COQ10) 100 MG CAPS Take 100 mg by mouth daily.      Continuous Blood Gluc Transmit (DEXCOM G6 TRANSMITTER) MISC 1 Device by Does not apply route every 3 (three) months. E11.65 1 each 3   dapagliflozin  propanediol (FARXIGA ) 10 MG TABS tablet TAKE 1 TABLET BY MOUTH EVERY DAY BEFORE BREAKFAST 90 tablet 3   fenofibrate  160 MG tablet Take 1 tablet (160 mg total) by mouth daily. 90 tablet 3   glucose blood (ONETOUCH ULTRA) test strip Use to check blood sugar daily. Dx E11.9 200 strip 3   GVOKE HYPOPEN  2-PACK 1 MG/0.2ML SOAJ INJECT 0.2 MG UNDER SKIN AS NEEDED FOR LOW BLOOD SUGARS 0.4 mL PRN    Insulin  Disposable Pump (OMNIPOD 5 G6 PODS, GEN 5,) MISC USE AS INSTRUCTED TO ADMINISTER INSULIN  30 each 2   insulin  glargine-yfgn (SEMGLEE ) 100 UNIT/ML Pen INJECT 25 UNITS INTO THE SKIN DAILY. 45 mL 1   insulin  lispro (HUMALOG  KWIKPEN) 200 UNIT/ML KwikPen USE AS INSTRUCTED IN THE INSULIN  PUMP MAX DAILY 145 UNITS 72 mL 3   Insulin  Pen Needle 32G X 4 MM MISC Use 4x a day 300 each 3   metFORMIN  (GLUCOPHAGE ) 1000 MG tablet TAKE 1 TABLET BY MOUTH TWICE A DAY 180 tablet 3   Multiple Vitamins-Minerals (PRESERVISION AREDS 2 PO) Take 1 tablet by mouth 2 (two) times daily.      Omega-3 Fatty Acids (FISH OIL ) 1000 MG CAPS Take 1-2 capsules (1,000-2,000 mg total) by mouth in the morning and at bedtime.  0   sildenafil  (VIAGRA ) 100 MG tablet Take 1 tablet (100 mg total) by mouth as needed for erectile dysfunction. 30 tablet 6   traZODone  (DESYREL ) 150 MG tablet Take 1 tablet (150 mg total) by mouth daily. 90 tablet 3   valsartan  (  DIOVAN ) 160 MG tablet TAKE 1 TABLET BY MOUTH EVERY DAY 90 tablet 3   Zinc  Oxide 15 MG TBDP 2 tabs per day     No current facility-administered medications for this visit.     Allergies:   Ozempic  (0.25 or 0.5 mg-dose) [semaglutide (0.25 or 0.5mg -dos)] and Amoxil [amoxicillin]   Social History:  The patient  reports that he has never smoked. He has never used smokeless tobacco. He reports that he does not currently use alcohol. He reports that he does not use drugs.   Family History:   family history includes Diabetes in his father; Hypertension in his mother.    Review of Systems: Review of Systems  Constitutional: Negative.   HENT: Negative.    Respiratory: Negative.    Cardiovascular: Negative.   Gastrointestinal: Negative.   Musculoskeletal: Negative.   Neurological: Negative.   Psychiatric/Behavioral: Negative.    All other systems reviewed and are negative.  PHYSICAL EXAM: VS:  BP (!) 150/60 (BP Location: Left Arm, Patient Position: Sitting, Cuff Size: Normal)    Pulse 61   Ht 5' 9 (1.753 m)   Wt 180 lb 8 oz (81.9 kg)   SpO2 97%   BMI 26.66 kg/m  , BMI Body mass index is 26.66 kg/m. Constitutional:  oriented to person, place, and time. No distress.  HENT:  Head: Grossly normal Eyes:  no discharge. No scleral icterus.  Neck: No JVD, no carotid bruits  Cardiovascular: Regular rate and rhythm, no murmurs appreciated Pulmonary/Chest: Clear to auscultation bilaterally, no wheezes or rales Abdominal: Soft.  no distension.  no tenderness.  Musculoskeletal: Normal range of motion Neurological:  normal muscle tone. Coordination normal. No atrophy Skin: Skin warm and dry Psychiatric: normal affect, pleasant  Recent Labs: 02/02/2023: ALT 30; BUN 17; Creatinine, Ser 1.16; Hemoglobin 14.9; Platelets 223.0; Potassium 4.3; Sodium 139; TSH 1.47    Lipid Panel Lab Results  Component Value Date   CHOL 91 02/02/2023   HDL 23.20 (L) 02/02/2023   LDLCALC 49 02/02/2023   TRIG 98.0 02/02/2023    Wt Readings from Last 3 Encounters:  09/07/23 180 lb 8 oz (81.9 kg)  07/27/23 178 lb (80.7 kg)  06/03/23 185 lb 6.4 oz (84.1 kg)     ASSESSMENT AND PLAN:  Problem List Items Addressed This Visit       Cardiology Problems   Carotid arterial disease (HCC)   Hyperlipemia   Hypertension associated with diabetes (HCC)   Relevant Orders   EKG 12-Lead (Completed)   Atrial fibrillation (HCC)   Relevant Orders   EKG 12-Lead (Completed)     Other   Diabetes mellitus (HCC)   S/P CABG x 5   Relevant Orders   EKG 12-Lead (Completed)   Other Visit Diagnoses       Atherosclerosis of native coronary artery of native heart with stable angina pectoris (HCC)    -  Primary   Relevant Orders   EKG 12-Lead (Completed)     PAD (peripheral artery disease) (HCC)          CAD with stable angina CABG x5, November 2021 Previously started on Entresto , changed back to valsartan  secondary to cost Continue aspirin , statin, beta-blocker,  Currently with no symptoms  of angina. No further workup at this time. Continue current medication regimen. If blood pressure runs high at home could increase valsartan  dosing  Hyperlipidemia Tolerating Lipitor 80 daily Cholesterol at goal  Essential hypertension Initial blood pressure elevated, mild improvement on recheck Will continue to monitor  blood pressure at home.  If it runs high, could increase valsartan  dosing  Diabetes type 2 with complications Followed by endocrinology A1c less than 6, well-controlled, has insulin  pump, closely monitors sugars  Left arm numbness, pain Resolved   Signed, Velinda Lunger, M.D., Ph.D. [facility] [place], [place] [phone]

## 2023-09-07 ENCOUNTER — Ambulatory Visit: Attending: Cardiovascular Disease | Admitting: Cardiovascular Disease

## 2023-09-07 ENCOUNTER — Encounter: Payer: Self-pay | Admitting: Cardiovascular Disease

## 2023-09-07 VITALS — BP 141/70 | HR 61 | Ht 69.0 in | Wt 180.5 lb

## 2023-09-07 DIAGNOSIS — I739 Peripheral vascular disease, unspecified: Secondary | ICD-10-CM | POA: Diagnosis not present

## 2023-09-07 DIAGNOSIS — E1159 Type 2 diabetes mellitus with other circulatory complications: Secondary | ICD-10-CM | POA: Insufficient documentation

## 2023-09-07 DIAGNOSIS — I4819 Other persistent atrial fibrillation: Secondary | ICD-10-CM | POA: Insufficient documentation

## 2023-09-07 DIAGNOSIS — Z794 Long term (current) use of insulin: Secondary | ICD-10-CM | POA: Diagnosis not present

## 2023-09-07 DIAGNOSIS — I25118 Atherosclerotic heart disease of native coronary artery with other forms of angina pectoris: Secondary | ICD-10-CM | POA: Diagnosis not present

## 2023-09-07 DIAGNOSIS — Z951 Presence of aortocoronary bypass graft: Secondary | ICD-10-CM | POA: Insufficient documentation

## 2023-09-07 DIAGNOSIS — E782 Mixed hyperlipidemia: Secondary | ICD-10-CM | POA: Insufficient documentation

## 2023-09-07 DIAGNOSIS — I779 Disorder of arteries and arterioles, unspecified: Secondary | ICD-10-CM | POA: Insufficient documentation

## 2023-09-07 DIAGNOSIS — I152 Hypertension secondary to endocrine disorders: Secondary | ICD-10-CM | POA: Insufficient documentation

## 2023-09-07 MED ORDER — CARVEDILOL 12.5 MG PO TABS
12.5000 mg | ORAL_TABLET | Freq: Two times a day (BID) | ORAL | 3 refills | Status: AC
Start: 2023-09-07 — End: ?

## 2023-09-07 MED ORDER — ATORVASTATIN CALCIUM 80 MG PO TABS: 80.0000 mg | ORAL_TABLET | Freq: Every day | ORAL | 3 refills | Status: AC

## 2023-09-07 MED ORDER — VALSARTAN 160 MG PO TABS: 160.0000 mg | ORAL_TABLET | Freq: Every day | ORAL | 3 refills | Status: AC

## 2023-09-07 NOTE — Patient Instructions (Signed)

## 2023-10-14 ENCOUNTER — Encounter: Payer: Self-pay | Admitting: Internal Medicine

## 2023-10-14 ENCOUNTER — Ambulatory Visit (INDEPENDENT_AMBULATORY_CARE_PROVIDER_SITE_OTHER): Admitting: Internal Medicine

## 2023-10-14 VITALS — BP 130/70 | HR 55 | Ht 69.0 in | Wt 179.0 lb

## 2023-10-14 DIAGNOSIS — E1159 Type 2 diabetes mellitus with other circulatory complications: Secondary | ICD-10-CM | POA: Diagnosis not present

## 2023-10-14 DIAGNOSIS — Z794 Long term (current) use of insulin: Secondary | ICD-10-CM

## 2023-10-14 DIAGNOSIS — E785 Hyperlipidemia, unspecified: Secondary | ICD-10-CM | POA: Diagnosis not present

## 2023-10-14 LAB — POCT GLYCOSYLATED HEMOGLOBIN (HGB A1C): Hemoglobin A1C: 5.8 % — AB (ref 4.0–5.6)

## 2023-10-14 NOTE — Progress Notes (Addendum)
 Patient ID: Luis Wise, male   DOB: Feb 04, 1941, 83 y.o.   MRN: [id number]   HPI: Luis Wise is a 83 y.o.-year-old male, initially referred by his PCP, Dr. Cleatus, returning for follow-up: DM2, dx in 2004-2005, insulin -dependent, uncontrolled, with complications (CAD - s/p CABG x5, CKD).  Last visit 3.5 months ago.  He is here with his wife, who is a former Nurse, learning disability and offers part of the history especially related to his medication regimen, blood sugars, and activity.  Interim history: No increased urination, blurry vision, nausea, chest pain.  Wife reports that he has intermittent zapping pain in his entire body (arms and legs) few times a night -this may wake him up.  He is not sure what can cause this.  Insulin  pump: - Omnipod 5 >> $$  - t:slim X2 - cheaper for him as it is considered durable medical equipment as opposed to the OmniPod pump which goes to his prescription benefits - started mid 08/2022 He had a pump failure 02/2023 but got a replacement immediately.  CGM: - Dexcom G6 >> G7  Insulin : - Humalog  U200    Reviewed HbA1c levels: Lab Results  Component Value Date   HGBA1C 5.5 06/03/2023   HGBA1C 5.8 (A) 01/28/2023   HGBA1C 6.2 (A) 09/25/2022   HGBA1C 6.1 (A) 07/02/2022   HGBA1C 5.9 (A) 03/11/2022   HGBA1C 6.4 01/08/2022   HGBA1C 6.2 (A) 11/06/2021   HGBA1C 6.5 (A) 08/07/2021   HGBA1C 7.5 (A) 04/03/2021   HGBA1C 7.7 (H) 01/08/2021   Patient was on a regimen of: - Metformin  500 mg in am and 1000 mg in pm >> 1500 mg at dinnertime.  Mom - Farxiga  5 >> 10 mg before breakfast-added 03/2020 - Glimepiride  4 mg in am - NovoLog  (16)-(16)-(14-16) before B-L-D - Tresiba  10 >> 26 >> 30 >> 32 >> 30 units daily-added 06/2020 >> now 24 units He was also tried on Rybelsus  but this was expensive.  He then tried Ozempic  but he developed nausea and vomiting and then increase lipase on 02/27/2020 (82  >> 119) on the 0.5 mg dose.  Ozempic  was stopped at that time. Prev. On  Januvia  >> tolerated well but expensive.  Currently on: - Farxiga  10 mg before b'fast - Metformin  1500 mg with dinner >> 1000 mg in am  - Insulin  pump: - Basal rates: 12 am: 2 >> 1.8 units/h 6 am: 2.0    5 pm: 1.7 - Insulin  to carb ratio: 12 am: 1:8 6 am: 1:7  12 pm: 1:6 5 pm: 1:9 - Target: 12 am: 110 - Correction factor (insulin  sensitivity factor):  12 am: 70  Total daily dose from basal insulin : 24 units >> 22.6 units (44%) >> 41% >> 63% >> 60% >> 65% (35 units) Total daily dose from bolus insulin : 25-36 units >> 28.5 units (56%) >> 37% >> 40% >> 35% TDD: 63-100 >> 54-100 units  Pt checks his sugars >4x a day:  Previously:  Prev.:  Previously:   Lowest sugar was 60 >> 63 >> 56 >> 55; he has hypoglycemia awareness in the 60s.  Highest sugar was 300 >> 200s >> 274 >> 260s.  Glucometer: OneTouch ultra  Pt's meals are: - Breakfast: 1 egg + toast or cereal or oatmeal >> 2 eggs, bacon, 1 slice of toast or English muffin - Lunch: grilled fish + veggies - Dinner: meat + veggies - Snacks: 2: nuts, wheat crackers He walks 3 miles 2-3 days a week for exercise.  He finished cardiac rehab.   -No CKD, last BUN/creatinine:  Lab Results  Component Value Date   BUN 17 02/02/2023   BUN 18 01/08/2022   CREATININE 1.16 02/02/2023   CREATININE 1.13 01/08/2022   Lab Results  Component Value Date   MICRALBCREAT <30 06/24/2018  On valsartan  160 mg daily, previously on lisinopril .  -+ HL; last set of lipids: Lab Results  Component Value Date   CHOL 91 02/02/2023   HDL 23.20 (L) 02/02/2023   LDLCALC 49 02/02/2023   LDLDIRECT 35.0 01/08/2021   TRIG 98.0 02/02/2023   CHOLHDL 4 02/02/2023  On atorvastatin  80 mg daily, fenofibrate  160, co-Q10  On ASA 81.  - last eye exam was 08/04/2022: No DR.  - no numbness and tingling in his feet.  Last foot exam 01/28/2023.  Pt has FH of DM in father in his 52s.  He was taken off amiodarone .    ROS: + See HPI  I reviewed  pt's medications, allergies, PMH, social hx, family hx, and changes were documented in the history of present illness. Otherwise, unchanged from my initial visit note.  Past Medical History:  Diagnosis Date   Allergy    Amoxicillin   Cataract 2017   Mild progression   Coronary artery disease    a. s/p 5-vessel CABG (LIMA-LAD, SVG-diag, sequential SVG-OM1-OM 2, & SVG-PDA)   Diabetes mellitus with complication (HCC)    Hyperlipidemia    Hypertension    Insomnia    neuropathy    bilateral feet   Postoperative atrial fibrillation [facility])    Past Surgical History:  Procedure Laterality Date   CARDIAC CATHETERIZATION     01/19/2020   COLONOSCOPY WITH PROPOFOL  N/A 04/01/2021   Procedure: COLONOSCOPY WITH PROPOFOL ;  Surgeon: Jinny Carmine, MD;  Location: [facility] ENDOSCOPY;  Service: Endoscopy;  Laterality: N/A;   CORONARY ARTERY BYPASS GRAFT N/A 01/29/2020   Procedure: CORONARY ARTERY BYPASS GRAFTING (CABG) TIMES FIVE USING LIMA to LAD; ENDOSCOPIC HARVESTED GREATER SAPHENOUS VEIN: SVG to PDA; SVG to sequenced OM1 & OM2.;  Surgeon: Fleeta Hanford Coy, MD;  Location: [facility] OR;  Service: Open Heart Surgery;  Laterality: N/A;   ENDOVEIN HARVEST OF GREATER SAPHENOUS VEIN Bilateral 01/29/2020   Procedure: ENDOVEIN HARVEST OF GREATER SAPHENOUS VEIN;  Surgeon: Fleeta Hanford Coy, MD;  Location: [facility] OR;  Service: Open Heart Surgery;  Laterality: Bilateral;   EYE SURGERY  Radial K   1997   LAMINECTOMY  1993   L5-S1   LEFT HEART CATH AND CORONARY ANGIOGRAPHY N/A 01/19/2020   Procedure: LEFT HEART CATH AND CORONARY ANGIOGRAPHY;  Surgeon: Darron Deatrice LABOR, MD;  Location: [facility] INVASIVE CV LAB;  Service: Cardiovascular;  Laterality: N/A;   MENISCUS REPAIR  1996   TEE WITHOUT CARDIOVERSION N/A 01/29/2020   Procedure: TRANSESOPHAGEAL ECHOCARDIOGRAM (TEE);  Surgeon: Fleeta Hanford, Coy, MD;  Location: [facility] OR;  Service: Open Heart Surgery;  Laterality: N/A;   TONSILLECTOMY     Social History   Socioeconomic History    Marital status: Married    Spouse name: Not on file   Number of children: Not on file   Years of education: Not on file   Highest education level: Master's degree (e.g., MA, MS, MEng, MEd, MSW, MBA)  Occupational History   Occupation: CFO  Tobacco Use   Smoking status: Never   Smokeless tobacco: Never  Vaping Use   Vaping status: Never Used  Substance and Sexual Activity   Alcohol use: Not Currently    Alcohol/week: 0.0 standard  drinks of alcohol    Comment: Very rare- occasionaly beer or wine    Drug use: No   Sexual activity: Yes    Birth control/protection: None  Other Topics Concern   Not on file  Social History Narrative   Married 1970.   [employer]  undergrad, masters at [employer] .   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   Social Drivers of Corporate investment banker Strain: Low Risk  (02/05/2023)   Overall Financial Resource Strain (CARDIA)    Difficulty of Paying Living Expenses: Not hard at all  Food Insecurity: No Food Insecurity (02/05/2023)   Hunger Vital Sign    Worried About Running Out of Food in the Last Year: Never true    Ran Out of Food in the Last Year: Never true  Transportation Needs: No Transportation Needs (02/05/2023)   PRAPARE - Administrator, Civil Service (Medical): No    Lack of Transportation (Non-Medical): No  Physical Activity: Sufficiently Active (02/05/2023)   Exercise Vital Sign    Days of Exercise per Week: 3 days    Minutes of Exercise per Session: 50 min  Stress: No Stress Concern Present (02/05/2023)   [employer] of Occupational Health - Occupational Stress Questionnaire    Feeling of Stress : Not at all  Social Connections: Socially Integrated (02/05/2023)   Social Connection and Isolation Panel    Frequency of Communication with Friends and Family: More than three times a week    Frequency of Social  Gatherings with Friends and Family: Once a week    Attends Religious Services: More than 4 times per year    Active Member of [employer] or Organizations: Yes    Attends Engineer, structural: More than 4 times per year    Marital Status: Married  Catering manager Violence: Not At Risk (12/22/2021)   Humiliation, Afraid, Rape, and Kick questionnaire    Fear of Current or Ex-Partner: No    Emotionally Abused: No    Physically Abused: No    Sexually Abused: No   Current Outpatient Medications on File Prior to Visit  Medication Sig Dispense Refill   amLODipine  (NORVASC ) 10 MG tablet Take 1 tablet (10 mg total) by mouth daily. 90 tablet 3   atorvastatin  (LIPITOR) 80 MG tablet Take 1 tablet (80 mg total) by mouth daily. 90 tablet 3   benzonatate  (TESSALON ) 200 MG capsule Take 1 capsule (200 mg total) by mouth 3 (three) times daily as needed. 20 capsule 0   carvedilol  (COREG ) 12.5 MG tablet Take 1 tablet (12.5 mg total) by mouth 2 (two) times daily. 180 tablet 3   cholecalciferol (VITAMIN D3) 25 MCG (1000 UNIT) tablet Take 1,000 Units by mouth daily.     Coenzyme Q10 (COQ10) 100 MG CAPS Take 100 mg by mouth daily.      Continuous Blood Gluc Transmit (DEXCOM G6 TRANSMITTER) MISC 1 Device by Does not apply route every 3 (three) months. E11.65 1 each 3   dapagliflozin  propanediol (FARXIGA ) 10 MG TABS tablet TAKE 1 TABLET BY MOUTH EVERY DAY BEFORE BREAKFAST 90 tablet 3   fenofibrate  160 MG tablet Take 1 tablet (160 mg total) by mouth daily. 90 tablet 3   glucose blood (ONETOUCH ULTRA) test strip Use to check blood sugar daily. Dx E11.9 200 strip 3   GVOKE HYPOPEN  2-PACK 1 MG/0.2ML SOAJ INJECT 0.2 MG UNDER SKIN AS  NEEDED FOR LOW BLOOD SUGARS 0.4 mL PRN   Insulin  Disposable Pump (OMNIPOD 5 G6 PODS, GEN 5,) MISC USE AS INSTRUCTED TO ADMINISTER INSULIN  30 each 2   insulin  glargine-yfgn (SEMGLEE ) 100 UNIT/ML Pen INJECT 25 UNITS INTO THE SKIN DAILY. 45 mL 1   insulin  lispro (HUMALOG  KWIKPEN) 200  UNIT/ML KwikPen USE AS INSTRUCTED IN THE INSULIN  PUMP MAX DAILY 145 UNITS 72 mL 3   Insulin  Pen Needle 32G X 4 MM MISC Use 4x a day 300 each 3   metFORMIN  (GLUCOPHAGE ) 1000 MG tablet TAKE 1 TABLET BY MOUTH TWICE A DAY 180 tablet 3   Multiple Vitamins-Minerals (PRESERVISION AREDS 2 PO) Take 1 tablet by mouth 2 (two) times daily.      Omega-3 Fatty Acids (FISH OIL ) 1000 MG CAPS Take 1-2 capsules (1,000-2,000 mg total) by mouth in the morning and at bedtime.  0   sildenafil  (VIAGRA ) 100 MG tablet Take 1 tablet (100 mg total) by mouth as needed for erectile dysfunction. 30 tablet 6   traZODone  (DESYREL ) 150 MG tablet Take 1 tablet (150 mg total) by mouth daily. 90 tablet 3   valsartan  (DIOVAN ) 160 MG tablet Take 1 tablet (160 mg total) by mouth daily. 90 tablet 3   Zinc  Oxide 15 MG TBDP 2 tabs per day     No current facility-administered medications on file prior to visit.   Allergies  Allergen Reactions   Ozempic  (0.25 Or 0.5 Mg-Dose) [Semaglutide (0.25 Or 0.5mg -Dos)]     Presumed cause of GI upset.     Amoxil [Amoxicillin] Rash   Family History  Problem Relation Age of Onset   Hypertension Mother    Diabetes Father    Cancer Neg Hx    COPD Neg Hx    Heart disease Neg Hx    Hyperlipidemia Neg Hx    Stroke Neg Hx    Colon cancer Neg Hx    Prostate cancer Neg Hx    PE: BP 130/70   Pulse (!) 55   Ht 5' 9 (1.753 m)   Wt 179 lb (81.2 kg)   SpO2 95%   BMI 26.43 kg/m  Wt Readings from Last 3 Encounters:  10/14/23 179 lb (81.2 kg)  09/07/23 180 lb 8 oz (81.9 kg)  07/27/23 178 lb (80.7 kg)   Constitutional: normal weight, in NAD Eyes: EOMI, no exophthalmos ENT: no thyromegaly, no cervical lymphadenopathy Cardiovascular: RRR, No RG, + SEM 1/6 Respiratory: CTA B Musculoskeletal: no deformities Skin:  no rashes Neurological: no tremor with outstretched hands Diabetic Foot Exam - Simple   Simple Foot Form Diabetic Foot exam was performed with the following findings: Yes 10/14/2023  10:37 AM  Visual Inspection No deformities, no ulcerations, no other skin breakdown bilaterally: Yes Sensation Testing Intact to touch and monofilament testing bilaterally: Yes Pulse Check Posterior Tibialis and Dorsalis pulse intact bilaterally: Yes Comments    ASSESSMENT: 1. DM, insulin -dependent, uncontrolled, with complications - CAD, s/p CABG x5 - CKD  He had difficulty obtaining the t:slim X2 insulin  pump due to lack of insulin  deficiency.  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  He has good insulin  production.  Of note, we cannot use a GLP-1 receptor agonist for him due to previously elevated lipase.  2. HL  PLAN:  1. Patient with longstanding, well-controlled insulin -dependent diabetes on the t:slim X2 insulin  pump changed in 08/2022 from OmniPod 5 pump due to price.  His pump is integrated with the Dexcom G7 CGM.  He is also on an SGLT2 inhibitor as he has a history of CAD and CKD.  He tolerated well.  At last visit, HbA1c was lower, excellent, at 5.5%.  At last visit, sugars were a lot better controlled compared to the previous visit, with most sugars at goal, except for very mild hyperglycemic exceptions after breakfast and dinner.  However, he did complain about occasional lower blood sugars especially around 3 AM and sugars appeared to be dropping after an  initial increase after dinner.  He was not bolusing for correction and the pump did not appear to give him correction boluses so I suspected that the drop in blood sugars in such instances related to the excessively high basal rate that he had overnight.  I advised him to reduce this.  He was dropping his blood sugars right after the Humalog  injection and upon questioning, he was injecting this approximately 20 minutes before each meal.  I advised him to move this 10 to 15 minutes before meals but otherwise did not change his regimen. CGM interpretation: -At today's visit, we reviewed his CGM downloads: It appears that 89% of values are in target range (goal >70%), while 11% are higher than 180 (goal <25%), and 0% are lower than 70 (goal <4%).  The calculated average blood sugar is 128.  The projected HbA1c for the next 3 months (GMI) is 6.4%. -Reviewing the CGM trends, the vast majority of his blood sugars appear to be fluctuating within the target range, with only occasional higher blood sugars particularly after breakfast.  However, he also reports that after this meal, sugars may be lower, down to the 60s or 50s.  This happened even without him being very active, for example, while in [place]).  -At today's visit, I suggested to strengthen his insulin  to carb ratio for breakfast since the sugars after this meal are higher despite introducing his carbs into the pump.  I suspect that the blood sugars being low after this meal can be due to the plan giving him extra insulin  boluses for correction for postprandial hyperglycemia.  To prevent this, I also recommended to relax his correction factor.  We also reduced his basal rates from 6 AM to 5 PM.  I introduced the changes into the pump for him. - I suggested to:  Patient Instructions  Please continue: - Farxiga  10 mg before b'fast - Metformin  1000 mg in am  Use the following pump settings: - Basal rates: 12 am: 1.8 6 am: 2.0 >> 1.8  5 pm: 1.7 -  Insulin  to carb ratio: 12 am: 1:8 6 am: 1:7 >> 1:6.5 12 pm: 1:6 5 pm: 1:9 - Target: 12 am: 110 - Correction factor (insulin  sensitivity factor):  12 am: 70 >> 80  Please return in 4-6 months.  - we checked his HbA1c: 5.8% (slightly higher, but still excellent) - advised to check sugars at different times of the day - 4x a day, rotating check times - advised for yearly eye exams >> he is UTD - will check an ACR today - return to clinic in 4-6 months (to obtain his supplies, he apparently needs to come every 4 months)  2. HL - Latest lipid panel showed fractions at goal with the exception of a low HDL: Lab Results  Component Value Date   CHOL 91 02/02/2023   HDL 23.20 (L) 02/02/2023   LDLCALC 49 02/02/2023   LDLDIRECT 35.0 01/08/2021   TRIG 98.0 02/02/2023   CHOLHDL 4 02/02/2023  -he continues Lipitor 80 mg daily and fenofibrate  160 mg daily without side effects  Component     Latest Ref Rng 10/14/2023  Hemoglobin A1C     4.0 - 5.6 % 5.8 !   Creatinine, Urine     20 - 320 mg/dL 79   Microalb, Ur     mg/dL 87.9   MICROALB/CREAT RATIO     <30 mg/g creat 152 (H)   ACR slightly high.  He is already on Farxiga  and valsartan  which should also help.  I would like to recheck this at next visit.  Lela Fendt, MD PhD [facility] Endocrinology

## 2023-10-14 NOTE — Patient Instructions (Addendum)
 Please continue: - Farxiga  10 mg before b'fast - Metformin  1000 mg in am  Use the following pump settings: - Basal rates: 12 am: 1.8 6 am: 2.0 >> 1.8    5 pm: 1.7 - Insulin  to carb ratio: 12 am: 1:8 6 am: 1:7 >> 1:6.5 12 pm: 1:6 5 pm: 1:9 - Target: 12 am: 110 - Correction factor (insulin  sensitivity factor):  12 am: 70 >> 80  Please return in 4-6 months.

## 2023-10-15 ENCOUNTER — Ambulatory Visit: Payer: Self-pay | Admitting: Internal Medicine

## 2023-10-15 LAB — MICROALBUMIN / CREATININE URINE RATIO
Creatinine, Urine: 79 mg/dL (ref 20–320)
Microalb Creat Ratio: 152 mg/g{creat} — ABNORMAL HIGH (ref <30)
Microalb, Ur: 12 mg/dL

## 2023-11-01 ENCOUNTER — Ambulatory Visit: Admitting: Student

## 2023-11-01 ENCOUNTER — Encounter: Payer: Self-pay | Admitting: Student

## 2023-11-01 ENCOUNTER — Telehealth: Payer: Self-pay | Admitting: Cardiovascular Disease

## 2023-11-01 VITALS — BP 142/70 | HR 61 | Ht 69.0 in | Wt 180.4 lb

## 2023-11-01 DIAGNOSIS — Z01810 Encounter for preprocedural cardiovascular examination: Secondary | ICD-10-CM | POA: Insufficient documentation

## 2023-11-01 DIAGNOSIS — Z7984 Long term (current) use of oral hypoglycemic drugs: Secondary | ICD-10-CM | POA: Diagnosis not present

## 2023-11-01 DIAGNOSIS — I255 Ischemic cardiomyopathy: Secondary | ICD-10-CM | POA: Diagnosis not present

## 2023-11-01 DIAGNOSIS — Z7982 Long term (current) use of aspirin: Secondary | ICD-10-CM | POA: Diagnosis not present

## 2023-11-01 DIAGNOSIS — E785 Hyperlipidemia, unspecified: Secondary | ICD-10-CM | POA: Diagnosis not present

## 2023-11-01 DIAGNOSIS — Y840 Cardiac catheterization as the cause of abnormal reaction of the patient, or of later complication, without mention of misadventure at the time of the procedure: Secondary | ICD-10-CM | POA: Diagnosis not present

## 2023-11-01 DIAGNOSIS — Z9641 Presence of insulin pump (external) (internal): Secondary | ICD-10-CM | POA: Diagnosis present

## 2023-11-01 DIAGNOSIS — R7989 Other specified abnormal findings of blood chemistry: Secondary | ICD-10-CM | POA: Diagnosis not present

## 2023-11-01 DIAGNOSIS — I480 Paroxysmal atrial fibrillation: Secondary | ICD-10-CM | POA: Diagnosis not present

## 2023-11-01 DIAGNOSIS — I25708 Atherosclerosis of coronary artery bypass graft(s), unspecified, with other forms of angina pectoris: Secondary | ICD-10-CM | POA: Diagnosis not present

## 2023-11-01 DIAGNOSIS — Z880 Allergy status to penicillin: Secondary | ICD-10-CM | POA: Diagnosis not present

## 2023-11-01 DIAGNOSIS — I10 Essential (primary) hypertension: Secondary | ICD-10-CM | POA: Insufficient documentation

## 2023-11-01 DIAGNOSIS — Z8249 Family history of ischemic heart disease and other diseases of the circulatory system: Secondary | ICD-10-CM | POA: Diagnosis not present

## 2023-11-01 DIAGNOSIS — I25110 Atherosclerotic heart disease of native coronary artery with unstable angina pectoris: Secondary | ICD-10-CM | POA: Diagnosis not present

## 2023-11-01 DIAGNOSIS — I200 Unstable angina: Secondary | ICD-10-CM | POA: Diagnosis not present

## 2023-11-01 DIAGNOSIS — G4700 Insomnia, unspecified: Secondary | ICD-10-CM | POA: Diagnosis not present

## 2023-11-01 DIAGNOSIS — Z794 Long term (current) use of insulin: Secondary | ICD-10-CM | POA: Diagnosis not present

## 2023-11-01 DIAGNOSIS — I5022 Chronic systolic (congestive) heart failure: Secondary | ICD-10-CM | POA: Diagnosis not present

## 2023-11-01 DIAGNOSIS — L7632 Postprocedural hematoma of skin and subcutaneous tissue following other procedure: Secondary | ICD-10-CM | POA: Diagnosis not present

## 2023-11-01 DIAGNOSIS — I2582 Chronic total occlusion of coronary artery: Secondary | ICD-10-CM | POA: Diagnosis not present

## 2023-11-01 DIAGNOSIS — I2510 Atherosclerotic heart disease of native coronary artery without angina pectoris: Secondary | ICD-10-CM | POA: Diagnosis not present

## 2023-11-01 DIAGNOSIS — Z79899 Other long term (current) drug therapy: Secondary | ICD-10-CM | POA: Diagnosis not present

## 2023-11-01 DIAGNOSIS — I4891 Unspecified atrial fibrillation: Secondary | ICD-10-CM | POA: Diagnosis present

## 2023-11-01 DIAGNOSIS — E119 Type 2 diabetes mellitus without complications: Secondary | ICD-10-CM | POA: Diagnosis not present

## 2023-11-01 DIAGNOSIS — Z888 Allergy status to other drugs, medicaments and biological substances status: Secondary | ICD-10-CM | POA: Diagnosis not present

## 2023-11-01 DIAGNOSIS — E1142 Type 2 diabetes mellitus with diabetic polyneuropathy: Secondary | ICD-10-CM | POA: Diagnosis not present

## 2023-11-01 DIAGNOSIS — Z833 Family history of diabetes mellitus: Secondary | ICD-10-CM | POA: Diagnosis not present

## 2023-11-01 DIAGNOSIS — I700 Atherosclerosis of aorta: Secondary | ICD-10-CM | POA: Diagnosis not present

## 2023-11-01 DIAGNOSIS — I214 Non-ST elevation (NSTEMI) myocardial infarction: Secondary | ICD-10-CM | POA: Diagnosis not present

## 2023-11-01 DIAGNOSIS — Z951 Presence of aortocoronary bypass graft: Secondary | ICD-10-CM | POA: Diagnosis not present

## 2023-11-01 DIAGNOSIS — I6523 Occlusion and stenosis of bilateral carotid arteries: Secondary | ICD-10-CM | POA: Diagnosis not present

## 2023-11-01 DIAGNOSIS — I25810 Atherosclerosis of coronary artery bypass graft(s) without angina pectoris: Secondary | ICD-10-CM | POA: Diagnosis present

## 2023-11-01 DIAGNOSIS — I447 Left bundle-branch block, unspecified: Secondary | ICD-10-CM | POA: Diagnosis present

## 2023-11-01 DIAGNOSIS — R079 Chest pain, unspecified: Secondary | ICD-10-CM | POA: Diagnosis not present

## 2023-11-01 DIAGNOSIS — I110 Hypertensive heart disease with heart failure: Secondary | ICD-10-CM | POA: Diagnosis not present

## 2023-11-01 DIAGNOSIS — Z7902 Long term (current) use of antithrombotics/antiplatelets: Secondary | ICD-10-CM | POA: Diagnosis not present

## 2023-11-01 MED ORDER — NITROGLYCERIN 0.4 MG SL SUBL
0.4000 mg | SUBLINGUAL_TABLET | SUBLINGUAL | 1 refills | Status: AC | PRN
Start: 2023-11-01 — End: 2024-01-30

## 2023-11-01 NOTE — Progress Notes (Signed)
 Cardiology Clinic Note   Date: 11/01/2023 ID: Luis Wise, Luis Wise 1940/05/06, MRN [id number]  Primary Cardiologist:  Luis Lunger, MD  Chief Complaint   Luis Wise is a 83 y.o. male who presents to the clinic today for evaluation of chest pain.   Patient Profile   Luis Wise is followed by Dr. Gollan for the history outlined below.      Past medical history significant for: CAD. LHC 01/19/2020 (abnormal coronary CTA): Proximal RCA 60%.  Distal RCA 50%.  RPDA 50%.  Mid RCA 70%.  OM2 80%.  Distal LCx 80%.  Proximal to mid LAD 100%.  D1 80%.  Proximal LAD 70%.  Severe heavily calcified three-vessel CAD.  Chronic occlusion of proximal to mid LAD with bridging collaterals as well as faint right to left collaterals.  Mildly elevated LVEDP.  Normal LV systolic function.  Recommend CT consult. CABG x 5 01/30/2020: LIMA to LAD, SVG to diagonal, sequential SVG to OM1 and OM 2, SVG to posterior descending. Echo 01/26/2020: EF 55 to 60%.  Mild hypokinesis of left ventricular apical septal wall.  Mild concentric LVH.  Grade I DD.  Normal RV size/function.  No significant valvular abnormalities. Postop PAF. Carotid artery disease. Carotid ultrasound 04/08/2023: Bilateral ICA 1 to 39%. Hypertension. Hyperlipidemia. Lipid panel 02/02/2023: LDL 49, HDL 23, TG 98, total 91. T2DM.  In summary, patient was first evaluated by Dr. Gollan on 01/02/2020 for chest pain after ED visit. BP was noted to be significantly elevated with SBP in the 190s.  He was noted to have an LBBB.  Initial troponin 47 with delta of 44.  He was started on isosorbide  and underwent coronary CTA which demonstrated calcium  score of 5706 with heavily calcified coronary arteries causing significant/severe stenosis of greater than 70% in the mid LAD, mid RCA, and ostial OM1.  FFR was significant in the mid LAD.  He underwent LHC which showed severe heavily calcified three-vessel CAD with CTO of proximal to mid LAD with bridging  collaterals and faint right to left collaterals.  He was referred to CT surgery and underwent CABG x 5 on 01/30/2020.  Preoperative echo demonstrated EF 55 to 60% as detailed above.  His operative course was complicated by A-fib treated with amiodarone  with subsequent conversion to sinus rhythm.  Upon transferring to the floor he redeveloped A-fib briefly and converted to sinus rhythm with oral medications.  Patient was last seen in the office by Dr. Gollan on 09/07/2023 for routine follow-up.  He was doing well at that time.  He reported walking 3 miles in the park 3 times a [place]  He was hypertensive with initial blood pressure with some improvement on recheck.  He was instructed to monitor BP at home.  Patient contacted the office on 11/01/2023 with complaints of chest pain.  Per triage RN The patient called to report experiencing chest pain a couple of times in the morning over the weekend. He stated the symptoms occurred upon waking and described the pain as a tightness that radiated across his chest. The patient was unable to identify any precipitating factors but noted that the symptoms relieved with Motrin. He denies any other symptoms during that time and currently denies chest pain. Additionally, the patient reported that his blood pressure has been within normal limits.     History of Present Illness    Today, patient is accompanied by his wife. He reports episodes  of chest pain that began 3 days ago. He describes chest tightness across the top of his chest like a weight on his chest that began upon awakening. He was able to complete his 3 mile walk while he was having chest pain and felt he noticed the discomfort more with walking and less when he got back to the car and was sitting. He denies associated shortness of breath. He feels this pain is similar to the discomfort he had prior to CABG but less intense. He denies lower extremity edema,  orthopnea, PND or palpitations. He does not have prn NTG.     ROS: All other systems reviewed and are otherwise negative except as noted in History of Present Illness.  EKGs/Labs Reviewed    EKG Interpretation Date/Time:  Monday November 01 2023 10:54:32 EDT Ventricular Rate:  61 PR Interval:  182 QRS Duration:  136 QT Interval:  440 QTC Calculation: 442 R Axis:   -12  Text Interpretation: Sinus rhythm with Premature supraventricular complexes Left bundle branch block When compared with ECG of 07-Sep-2023 08:18, Premature supraventricular complexes are now Present Confirmed by Loistine Sober [phone]) on 11/01/2023 10:58:53 AM   02/02/2023: ALT 30; AST 23; BUN 17; Creatinine, Ser 1.16; Potassium 4.3; Sodium 139   02/02/2023: Hemoglobin 14.9; WBC 3.4   02/02/2023: TSH 1.47   Risk Assessment/Calculations      HYPERTENSION CONTROL Vitals:   11/01/23 1047 11/01/23 1237  BP: (!) 150/70 (!) 142/70    The patient's blood pressure is elevated above target today.  In order to address the patient's elevated BP: Blood pressure will be monitored at home to determine if medication changes need to be made.           Physical Exam    VS:  BP (!) 142/70 (BP Location: Left Arm, Patient Position: Sitting, Cuff Size: Normal)   Pulse 61   Ht 5' 9 (1.753 m)   Wt 180 lb 6.4 oz (81.8 kg)   SpO2 98%   BMI 26.64 kg/m  , BMI Body mass index is 26.64 kg/m.  GEN: Well nourished, well developed, in no acute distress. Neck: No JVD or carotid bruits. Cardiac:  RRR.  No murmur. No rubs or gallops.   Respiratory:  Respirations regular and unlabored. Clear to auscultation without rales, wheezing or rhonchi. GI: Soft, nontender, nondistended. Extremities: Radials/DP/PT 2+ and equal bilaterally. No clubbing or cyanosis. No edema.  Skin: Warm and dry, no rash. Neuro: Strength intact.  Assessment & Plan   CAD S/p CABG x 12 January 2020.  Echo demonstrated EF 55 to 60%, mild hypokinesis of  left ventricular apical septal wall, mild concentric LVH, Grade I DD, normal RV size/function, no significant valvular abnormalities.  Patient describes episodes of chest tightness like a weight across top of chest more pronounced with exertion and improved with rest. He was having chest pain one night ago and patient's wife (who is an Charity fundraiser) considered taking him to the ED. Patient was able to go to sleep and has not had any pain today. No associated shortness of breath. Discomfort similar to prior to CABG but not as intense. EKG without acute changes. Given anginal equivalent patient agrees to heart catheterization.  - Continue amlodipine , carvedilol , atorvastatin , fenofibrate . - Schedule LHC.  - CBC and BMP today.  - Rx prn SL NTG.   Hypertension BP today 150/70 on intake and 142/70 on my recheck. BP has been normal at home.  - Continue valsartan , carvedilol , amlodipine .  Hyperlipidemia LDL  49, TG 98 November 2024, at goal. - Continue atorvastatin  and fenofibrate .  Disposition: Schedule LHC. Rx prn SL NTG. CBC and BMP today. Return in 3 weeks or sooner as needed.      Informed Consent   Shared Decision Making/Informed Consent The risks [stroke (1 in 1000), death (1 in 1000), kidney failure [usually temporary] (1 in 500), bleeding (1 in 200), allergic reaction [possibly serious] (1 in 200)], benefits (diagnostic support and management of coronary artery disease) and alternatives of a cardiac catheterization were discussed in detail with Mr. Kutch and he is willing to proceed.      Signed, Barnie HERO. Daisuke Bailey, DNP, NP-C

## 2023-11-01 NOTE — Telephone Encounter (Signed)
 The patient called to report experiencing chest pain a couple of times in the morning over the weekend. He stated the symptoms occurred upon waking and described the pain as a tightness that radiated across his chest. The patient was unable to identify any precipitating factors but noted that the symptoms relieved with Motrin. He denies any other symptoms during that time and currently denies chest pain. Additionally, the patient reported that his blood pressure has been within normal limits.  An appointment is scheduled for today, 11/01/23, with Barnie Hila, NP, for further evaluation.

## 2023-11-01 NOTE — Telephone Encounter (Signed)
  Pt c/o of Chest Pain: STAT if active CP, including tightness, pressure, jaw pain, radiating pain to shoulder/upper arm/back, CP unrelieved by Nitro. Symptoms reported of SOB, nausea, vomiting, sweating.  1. Are you having CP right now?   No - Last night  2. Are you experiencing any other symptoms (ex. SOB, nausea, vomiting, sweating)?   No  3. Is your CP continuous or coming and going?   Coming and going  4. Have you taken Nitroglycerin ?   No  5. How long have you been experiencing CP?   Wife stated patient told her for several days   6. If NO CP at time of call then end call with telling Pt to call back or call 911 if Chest pain returns prior to return call from triage team.   Wife [facility]) stated patient has been having chest tightness.

## 2023-11-01 NOTE — Patient Instructions (Signed)
 Medication Instructions:  Your physician recommends the following medication changes.  START TAKING: Nitroglycerin  0.4 mg  Vasodilator: Nitroglycerin  is a potent vasodilator, relaxing blood vessels and improving blood flow to the heart.  Treatment for Angina: It's used to treat and prevent angina (chest pain) caused by coronary artery disease.  Take one tablet for chest pain. Wait 5 minutes. If the pain has not subsided, take another tablet. Wait 5 minutes. If the pain has not subsided, take third tablet and, call 911 or go to the emergency room.   Potential Side Effects: Common side effects of nitroglycerin  in medical applications include headache, dizziness, and low blood pressure.  Handling & Storage: Due to its explosive nature, nitroglycerin  requires careful handling and storage, away from heat, shock, and incompatible materials.  Drug Interactions: Nitroglycerin  can interact with other medications, such as certain drugs used for erectile dysfunction, potentially leading to dangerous drops in blood pressure.   *If you need a refill on your cardiac medications before your next appointment, please call your pharmacy*  Lab Work: Your provider would like for you to have following labs drawn today BMet, CBC.   If you have labs (blood work) drawn today and your tests are completely normal, you will receive your results only by: MyChart Message (if you have MyChart) OR A paper copy in the mail If you have any lab test that is abnormal or we need to change your treatment, we will call you to review the results.  Testing/Procedures:  [facility] DEPT OF [place] [place] [address] [place] [address] Dept: [phone] Loc: [phone]  JERMAYNE SWEENEY  11/01/2023  You are scheduled for a Cardiac Catheterization on Thursday, August 28 with Dr. Alm Clay.  1. Please arrive at the [facility] &  Vascular Center Entrance of [facility], [address], [place] [address] at 1:00 PM (This is 1 hour(s) prior to your procedure time).  Proceed to the Check-In Desk directly inside the entrance.  Procedure Parking: Use the entrance off of the [address] side of the hospital. Turn right upon entering and follow the driveway to parking that is directly in front of the Heart & Vascular Center. There is no valet parking available at this entrance, however there is an awning directly in front of the Heart & Vascular Center for drop off/ pick up for patients.  Special note: Every effort is made to have your procedure done on time. Please understand that emergencies sometimes delay scheduled procedures.  2. Diet: NPO: Nothing to eat OR drink after midnight. (For TEE and Cath the same day)   3. Hydration: You need to be well hydrated before your procedure. On August 28, you may drink approved liquids (see below) until 2 hours before the procedure, with 16 oz of water  as your last intake.   List of approved liquids water , clear juice, clear tea, black coffee, fruit juices, non-citric and without pulp, carbonated beverages, Gatorade, Kool -Aid, plain Jello-O and plain ice popsicles.  4. Labs: You will need to have blood drawn on Monday, August 25 at [facility], Go to 1st desk on your right to register.  Address: [address] [place], [place] [address]  Open: 8am - 5pm  Phone: [phone]. You do not need to be fasting.  5. Medication instructions in preparation for your procedure:   Contrast Allergy: No   Current Outpatient Medications (Endocrine & Metabolic):  dapagliflozin  propanediol (FARXIGA ) 10 MG TABS tablet, TAKE 1 TABLET BY MOUTH EVERY DAY BEFORE BREAKFAST   GVOKE HYPOPEN  2-PACK 1 MG/0.2ML SOAJ, INJECT 0.2 MG UNDER SKIN AS NEEDED FOR LOW BLOOD SUGARS   insulin  glargine-yfgn (SEMGLEE ) 100 UNIT/ML Pen, INJECT 25 UNITS INTO THE SKIN DAILY.   insulin  lispro (HUMALOG   KWIKPEN) 200 UNIT/ML KwikPen, USE AS INSTRUCTED IN THE INSULIN  PUMP MAX DAILY 145 UNITS   metFORMIN  (GLUCOPHAGE ) 1000 MG tablet, TAKE 1 TABLET BY MOUTH TWICE A DAY  Current Outpatient Medications (Cardiovascular):    amLODipine  (NORVASC ) 10 MG tablet, Take 1 tablet (10 mg total) by mouth daily.   atorvastatin  (LIPITOR) 80 MG tablet, Take 1 tablet (80 mg total) by mouth daily.   carvedilol  (COREG ) 12.5 MG tablet, Take 1 tablet (12.5 mg total) by mouth 2 (two) times daily.   fenofibrate  160 MG tablet, Take 1 tablet (160 mg total) by mouth daily.   nitroGLYCERIN  (NITROSTAT ) 0.4 MG SL tablet, Place 1 tablet (0.4 mg total) under the tongue every 5 (five) minutes as needed for chest pain.   sildenafil  (VIAGRA ) 100 MG tablet, Take 1 tablet (100 mg total) by mouth as needed for erectile dysfunction.   valsartan  (DIOVAN ) 160 MG tablet, Take 1 tablet (160 mg total) by mouth daily.  Current Outpatient Medications (Respiratory):    benzonatate  (TESSALON ) 200 MG capsule, Take 1 capsule (200 mg total) by mouth 3 (three) times daily as needed.    Current Outpatient Medications (Other):    cholecalciferol  (VITAMIN D3) 25 MCG (1000 UNIT) tablet, Take 1,000 Units by mouth daily.   Coenzyme Q10 (COQ10) 100 MG CAPS, Take 100 mg by mouth daily.    Continuous Blood Gluc Transmit (DEXCOM G6 TRANSMITTER) MISC, 1 Device by Does not apply route every 3 (three) months. E11.65   glucose blood (ONETOUCH ULTRA) test strip, Use to check blood sugar daily. Dx E11.9   Insulin  Disposable Pump (OMNIPOD 5 G6 PODS, GEN 5,) MISC, USE AS INSTRUCTED TO ADMINISTER INSULIN    Insulin  Pen Needle 32G X 4 MM MISC, Use 4x a day   Multiple Vitamins-Minerals (PRESERVISION AREDS 2 PO), Take 1 tablet by mouth 2 (two) times daily.    Omega-3 Fatty Acids (FISH OIL ) 1000 MG CAPS, Take 1-2 capsules (1,000-2,000 mg total) by mouth in the morning and at bedtime.   traZODone  (DESYREL ) 150 MG tablet, Take 1 tablet (150 mg total) by mouth daily.    Zinc  Oxide 15 MG TBDP, 2 tabs per day *For reference purposes while preparing patient instructions.   Delete this med list prior to printing instructions for patient.*   Take only 1/2 dose in units of insulin  the night before your procedure. Do not take any insulin  on the day of the procedure.  Do not take Diabetes Med Glucophage  (Metformin ) on the day of the procedure and HOLD 48 HOURS AFTER THE PROCEDURE.  On the morning of your procedure, take your morning medicines NOT listed above.  You may use sips of water .  6. Plan to go home the same day, you will only stay overnight if medically necessary. 7. Bring a current list of your medications and current insurance cards. 8. You MUST have a responsible person to drive you home. 9. Someone MUST be with you the first 24 hours after you arrive home or your discharge will be delayed. 10. Please wear clothes that are easy to get on and off and wear slip-on shoes.  Thank you for allowing us  to care for you!   --  [facility]   Follow-Up: At [facility], you and your health needs are our priority.  As part of our continuing mission to provide you with exceptional heart care, our providers are all part of one team.  This team includes your primary Cardiologist (physician) and Advanced Practice Providers or APPs (Physician Assistants and Nurse Practitioners) who all work together to provide you with the care you need, when you need it.  Your next appointment:   3 week(s)  Provider:   Evalene Lunger, MD or Barnie Hila, NP    We recommend signing up for the patient portal called MyChart.  Sign up information is provided on this After Visit Summary.  MyChart is used to connect with patients for Virtual Visits (Telemedicine).  Patients are able to view lab/test results, encounter notes, upcoming appointments, etc.  Non-urgent messages can be sent to your provider as well.   To learn more about what  you can do with MyChart, go to [email or web address].

## 2023-11-02 ENCOUNTER — Ambulatory Visit: Payer: Self-pay | Admitting: Student

## 2023-11-02 LAB — CBC
Hematocrit: 43.3 % (ref 37.5–51.0)
Hemoglobin: 14.5 g/dL (ref 13.0–17.7)
MCH: 28.8 pg (ref 26.6–33.0)
MCHC: 33.5 g/dL (ref 31.5–35.7)
MCV: 86 fL (ref 79–97)
Platelets: 231 x10E3/uL (ref 150–450)
RBC: 5.03 x10E6/uL (ref 4.14–5.80)
RDW: 13.6 % (ref 11.6–15.4)
WBC: 3.4 x10E3/uL (ref 3.4–10.8)

## 2023-11-02 LAB — BASIC METABOLIC PANEL WITH GFR
BUN/Creatinine Ratio: 13 (ref 10–24)
BUN: 16 mg/dL (ref 8–27)
CO2: 21 mmol/L (ref 20–29)
Calcium: 9.9 mg/dL (ref 8.6–10.2)
Chloride: 102 mmol/L (ref 96–106)
Creatinine, Ser: 1.19 mg/dL (ref 0.76–1.27)
Glucose: 78 mg/dL (ref 70–99)
Potassium: 4.2 mmol/L (ref 3.5–5.2)
Sodium: 140 mmol/L (ref 134–144)
eGFR: 62 mL/min/1.73 (ref >59)

## 2023-11-02 NOTE — Telephone Encounter (Signed)
 Wife left message on my machine saying Luis Wise is have a cardiac cath on Thursday and they want Luis Wise to decrease his pump settings to half the amounts starting at MN.  If I tell her how to do this, I have to take him out of control IQ.  Which means he will not be getting any extra insulin  if high, or stop the flow of insulin  if readings start to drop.  Is this ok with you???

## 2023-11-03 ENCOUNTER — Telehealth: Payer: Self-pay | Admitting: Nutrition

## 2023-11-03 ENCOUNTER — Telehealth: Payer: Self-pay | Admitting: Cardiovascular Disease

## 2023-11-03 ENCOUNTER — Encounter: Payer: Self-pay | Admitting: Family Medicine

## 2023-11-03 ENCOUNTER — Other Ambulatory Visit: Payer: Self-pay

## 2023-11-03 ENCOUNTER — Emergency Department

## 2023-11-03 ENCOUNTER — Inpatient Hospital Stay
Admission: EM | Admit: 2023-11-03 | Discharge: 2023-11-04 | DRG: 281 | Disposition: A | Discharge status: Other Acute Inpatient Hospital | Attending: Student | Admitting: Student

## 2023-11-03 DIAGNOSIS — I25708 Atherosclerosis of coronary artery bypass graft(s), unspecified, with other forms of angina pectoris: Secondary | ICD-10-CM | POA: Diagnosis not present

## 2023-11-03 DIAGNOSIS — I214 Non-ST elevation (NSTEMI) myocardial infarction: Secondary | ICD-10-CM | POA: Diagnosis present

## 2023-11-03 DIAGNOSIS — R7989 Other specified abnormal findings of blood chemistry: Secondary | ICD-10-CM | POA: Diagnosis not present

## 2023-11-03 DIAGNOSIS — Z880 Allergy status to penicillin: Secondary | ICD-10-CM

## 2023-11-03 DIAGNOSIS — Z833 Family history of diabetes mellitus: Secondary | ICD-10-CM | POA: Diagnosis not present

## 2023-11-03 DIAGNOSIS — E119 Type 2 diabetes mellitus without complications: Secondary | ICD-10-CM

## 2023-11-03 DIAGNOSIS — E785 Hyperlipidemia, unspecified: Secondary | ICD-10-CM | POA: Diagnosis present

## 2023-11-03 DIAGNOSIS — Z7984 Long term (current) use of oral hypoglycemic drugs: Secondary | ICD-10-CM | POA: Diagnosis not present

## 2023-11-03 DIAGNOSIS — Z7982 Long term (current) use of aspirin: Secondary | ICD-10-CM | POA: Diagnosis not present

## 2023-11-03 DIAGNOSIS — I10 Essential (primary) hypertension: Secondary | ICD-10-CM | POA: Diagnosis present

## 2023-11-03 DIAGNOSIS — I4891 Unspecified atrial fibrillation: Secondary | ICD-10-CM | POA: Diagnosis present

## 2023-11-03 DIAGNOSIS — Z8249 Family history of ischemic heart disease and other diseases of the circulatory system: Secondary | ICD-10-CM

## 2023-11-03 DIAGNOSIS — I25810 Atherosclerosis of coronary artery bypass graft(s) without angina pectoris: Secondary | ICD-10-CM | POA: Diagnosis present

## 2023-11-03 DIAGNOSIS — Z888 Allergy status to other drugs, medicaments and biological substances status: Secondary | ICD-10-CM

## 2023-11-03 DIAGNOSIS — E1142 Type 2 diabetes mellitus with diabetic polyneuropathy: Secondary | ICD-10-CM | POA: Diagnosis present

## 2023-11-03 DIAGNOSIS — Z794 Long term (current) use of insulin: Secondary | ICD-10-CM | POA: Diagnosis not present

## 2023-11-03 DIAGNOSIS — Z79899 Other long term (current) drug therapy: Secondary | ICD-10-CM | POA: Diagnosis not present

## 2023-11-03 DIAGNOSIS — I2510 Atherosclerotic heart disease of native coronary artery without angina pectoris: Secondary | ICD-10-CM | POA: Diagnosis present

## 2023-11-03 DIAGNOSIS — Z9641 Presence of insulin pump (external) (internal): Secondary | ICD-10-CM | POA: Diagnosis present

## 2023-11-03 DIAGNOSIS — R079 Chest pain, unspecified: Secondary | ICD-10-CM | POA: Diagnosis not present

## 2023-11-03 DIAGNOSIS — I447 Left bundle-branch block, unspecified: Secondary | ICD-10-CM | POA: Diagnosis present

## 2023-11-03 DIAGNOSIS — I200 Unstable angina: Principal | ICD-10-CM | POA: Diagnosis present

## 2023-11-03 DIAGNOSIS — I25110 Atherosclerotic heart disease of native coronary artery with unstable angina pectoris: Secondary | ICD-10-CM | POA: Diagnosis not present

## 2023-11-03 DIAGNOSIS — I700 Atherosclerosis of aorta: Secondary | ICD-10-CM | POA: Diagnosis not present

## 2023-11-03 LAB — CBC
HCT: 40.2 % (ref 39.0–52.0)
Hemoglobin: 13.7 g/dL (ref 13.0–17.0)
MCH: 29 pg (ref 26.0–34.0)
MCHC: 34.1 g/dL (ref 30.0–36.0)
MCV: 85 fL (ref 80.0–100.0)
Platelets: 228 K/uL (ref 150–400)
RBC: 4.73 MIL/uL (ref 4.22–5.81)
RDW: 14.4 % (ref 11.5–15.5)
WBC: 4.7 K/uL (ref 4.0–10.5)
nRBC: 0 % (ref 0.0–0.2)

## 2023-11-03 LAB — BASIC METABOLIC PANEL WITH GFR
Anion gap: 8 (ref 5–15)
BUN: 23 mg/dL (ref 8–23)
CO2: 24 mmol/L (ref 22–32)
Calcium: 9.9 mg/dL (ref 8.9–10.3)
Chloride: 105 mmol/L (ref 98–111)
Creatinine, Ser: 1.18 mg/dL (ref 0.61–1.24)
GFR, Estimated: >60 mL/min (ref >60)
Glucose, Bld: 135 mg/dL — ABNORMAL HIGH (ref 70–99)
Potassium: 4.1 mmol/L (ref 3.5–5.1)
Sodium: 137 mmol/L (ref 135–145)

## 2023-11-03 LAB — TROPONIN I (HIGH SENSITIVITY)
Troponin I (High Sensitivity): 112 ng/L (ref <18)
Troponin I (High Sensitivity): 118 ng/L (ref <18)

## 2023-11-03 LAB — HEPARIN LEVEL (UNFRACTIONATED): Heparin Unfractionated: <0.1 [IU]/mL — ABNORMAL LOW (ref 0.30–0.70)

## 2023-11-03 LAB — APTT: aPTT: 26 s (ref 24–36)

## 2023-11-03 MED ORDER — NITROGLYCERIN 0.4 MG SL SUBL: 0.4000 mg | SUBLINGUAL_TABLET | SUBLINGUAL | Status: DC | PRN

## 2023-11-03 MED ORDER — TURMERIC CURCUMIN 500 MG PO CAPS: 1500.0000 mg | ORAL_CAPSULE | Freq: Every day | ORAL | Status: DC

## 2023-11-03 MED ORDER — CARVEDILOL 6.25 MG PO TABS
12.5000 mg | ORAL_TABLET | Freq: Two times a day (BID) | ORAL | Status: DC
  Administered 2023-11-04 (×2): 12.5 mg via ORAL
  Filled 2023-11-03 (×2): qty 2

## 2023-11-03 MED ORDER — ACETAMINOPHEN 325 MG PO TABS: 650.0000 mg | ORAL_TABLET | ORAL | Status: DC | PRN

## 2023-11-03 MED ORDER — ATORVASTATIN CALCIUM 20 MG PO TABS
80.0000 mg | ORAL_TABLET | Freq: Every day | ORAL | Status: DC
Start: 2023-11-04 — End: 2023-11-04
  Administered 2023-11-04: 80 mg via ORAL
  Filled 2023-11-03: qty 4

## 2023-11-03 MED ORDER — METOPROLOL TARTRATE 25 MG PO TABS: 12.5000 mg | ORAL_TABLET | Freq: Two times a day (BID) | ORAL | Status: DC

## 2023-11-03 MED ORDER — CYANOCOBALAMIN 500 MCG PO TABS
1000.0000 ug | ORAL_TABLET | Freq: Every day | ORAL | Status: DC
  Administered 2023-11-04: 1000 ug via ORAL
  Filled 2023-11-03: qty 2

## 2023-11-03 MED ORDER — MAGNESIUM HYDROXIDE 400 MG/5ML PO SUSP: 30.0000 mL | Freq: Every day | ORAL | Status: DC | PRN

## 2023-11-03 MED ORDER — TRAZODONE HCL 50 MG PO TABS
150.0000 mg | ORAL_TABLET | Freq: Every day | ORAL | Status: DC
  Administered 2023-11-04: 150 mg via ORAL
  Filled 2023-11-03: qty 1

## 2023-11-03 MED ORDER — SODIUM CHLORIDE 0.9 % IV SOLN: INTRAVENOUS | Status: DC

## 2023-11-03 MED ORDER — AMLODIPINE BESYLATE 5 MG PO TABS
10.0000 mg | ORAL_TABLET | Freq: Every day | ORAL | Status: DC
  Administered 2023-11-04: 10 mg via ORAL
  Filled 2023-11-03: qty 2

## 2023-11-03 MED ORDER — DAPAGLIFLOZIN PROPANEDIOL 10 MG PO TABS
10.0000 mg | ORAL_TABLET | Freq: Every day | ORAL | Status: DC
Start: 2023-11-04 — End: 2023-11-04
  Administered 2023-11-04: 10 mg via ORAL
  Filled 2023-11-03: qty 1

## 2023-11-03 MED ORDER — ASPIRIN 300 MG RE SUPP: 300.0000 mg | RECTAL | Status: AC

## 2023-11-03 MED ORDER — NITROGLYCERIN IN D5W 200-5 MCG/ML-% IV SOLN
0.0000 ug/min | INTRAVENOUS | Status: DC
  Administered 2023-11-04: 5 ug/min via INTRAVENOUS
  Filled 2023-11-03: qty 250

## 2023-11-03 MED ORDER — COQ10 100 MG PO CAPS: 100.0000 mg | ORAL_CAPSULE | Freq: Every evening | ORAL | Status: DC

## 2023-11-03 MED ORDER — IRBESARTAN 150 MG PO TABS
150.0000 mg | ORAL_TABLET | Freq: Every day | ORAL | Status: DC
  Administered 2023-11-04: 150 mg via ORAL
  Filled 2023-11-03: qty 1

## 2023-11-03 MED ORDER — HEPARIN (PORCINE) 25000 UT/250ML-% IV SOLN
1200.0000 [IU]/h | INTRAVENOUS | Status: DC
  Administered 2023-11-03: 1000 [IU]/h via INTRAVENOUS
  Filled 2023-11-03: qty 250

## 2023-11-03 MED ORDER — ALPRAZOLAM 0.25 MG PO TABS: 0.2500 mg | ORAL_TABLET | Freq: Two times a day (BID) | ORAL | Status: DC | PRN

## 2023-11-03 MED ORDER — ASPIRIN 81 MG PO CHEW
324.0000 mg | CHEWABLE_TABLET | ORAL | Status: AC
  Administered 2023-11-04: 324 mg via ORAL
  Filled 2023-11-03: qty 4

## 2023-11-03 MED ORDER — PROSIGHT PO TABS
1.0000 | ORAL_TABLET | Freq: Two times a day (BID) | ORAL | Status: DC
  Administered 2023-11-04: 1 via ORAL
  Filled 2023-11-03: qty 1

## 2023-11-03 MED ORDER — FENOFIBRATE 160 MG PO TABS
160.0000 mg | ORAL_TABLET | Freq: Every day | ORAL | Status: DC
Start: 2023-11-04 — End: 2023-11-04
  Administered 2023-11-04: 160 mg via ORAL
  Filled 2023-11-03: qty 1

## 2023-11-03 MED ORDER — ASPIRIN 81 MG PO TBEC
81.0000 mg | DELAYED_RELEASE_TABLET | Freq: Every day | ORAL | Status: DC
  Filled 2023-11-03: qty 1

## 2023-11-03 MED ORDER — HEPARIN BOLUS VIA INFUSION
4000.0000 [IU] | Freq: Once | INTRAVENOUS | Status: AC
  Administered 2023-11-03: 4000 [IU] via INTRAVENOUS
  Filled 2023-11-03: qty 4000

## 2023-11-03 MED ORDER — TRAZODONE HCL 50 MG PO TABS: 25.0000 mg | ORAL_TABLET | Freq: Every evening | ORAL | Status: DC | PRN

## 2023-11-03 MED ORDER — ONDANSETRON HCL 4 MG/2ML IJ SOLN: 4.0000 mg | Freq: Four times a day (QID) | INTRAMUSCULAR | Status: DC | PRN

## 2023-11-03 MED ORDER — DEXCOM G6 TRANSMITTER MISC: 1.0000 | Status: DC

## 2023-11-03 MED ORDER — VITAMIN D 25 MCG (1000 UNIT) PO TABS
1000.0000 [IU] | ORAL_TABLET | Freq: Every evening | ORAL | Status: DC
  Administered 2023-11-04: 1000 [IU] via ORAL
  Filled 2023-11-03: qty 1

## 2023-11-03 MED ORDER — ASPIRIN 81 MG PO TBEC: 81.0000 mg | DELAYED_RELEASE_TABLET | Freq: Every day | ORAL | Status: DC

## 2023-11-03 NOTE — Progress Notes (Signed)
 I called his wife yesterday and reviewed over the phone how to put the pump in exercise mode and how to stop this.  They both did this together correctly and took notes on each step involved.  Also discussed why this would be better than decreasing dose by 50%.  They had no final questions

## 2023-11-03 NOTE — Consult Note (Signed)
 Pharmacy Consult Note - Anticoagulation  Pharmacy Consult for Heparin   Indication: chest pain/ACS  PATIENT MEASUREMENTS: Height: 5' 9 (175.3 cm) Weight: 81.8 kg (180 lb 5.4 oz) IBW/kg (Calculated) : 70.7 HEPARIN  DW (KG): 81.8  VITAL SIGNS: Temp: 98.8 F (37.1 C) (08/27 2028) Temp Source: Oral (08/27 2028) BP: 143/69 (08/27 2028) Pulse Rate: 65 (08/27 2028)  Recent Labs    11/01/23 1147 11/03/23 2044  HGB 14.5 13.7  HCT 43.3 40.2  PLT 231 228  CREATININE 1.19  --     Estimated Creatinine Clearance: 49.5 mL/min (by C-G formula based on SCr of 1.19 mg/dL).  PAST MEDICAL HISTORY: Past Medical History:  Diagnosis Date   Allergy    Amoxicillin   Cataract 2017   Mild progression   Coronary artery disease    a. s/p 5-vessel CABG (LIMA-LAD, SVG-diag, sequential SVG-OM1-OM 2, & SVG-PDA)   Diabetes mellitus with complication (HCC)    Hyperlipidemia    Hypertension    Insomnia    neuropathy    bilateral feet   Postoperative atrial fibrillation [facility])     ASSESSMENT: 83 y.o. male with PMH CABG x5, HTN, diabetes is presenting with chest pain. Patient is not on chronic anticoagulation per chart review. Pharmacy has been consulted to initiate and manage heparin  intravenous infusion.  Pending baseline heparin  level and aPTT  Pertinent medications: (Not in a hospital admission)  Scheduled:   heparin   4,000 Units Intravenous Once   Infusions:   heparin      nitroGLYCERIN       Goal(s) of therapy: Heparin  level 0.3 - 0.7 units/mL aPTT 66 - 102 seconds Monitor platelets by anticoagulation protocol: Yes   Baseline anticoagulation labs: Recent Labs    11/01/23 1147 11/03/23 2044  HGB 14.5 13.7  PLT 231 228     PLAN: Give 4000 units bolus x1; then start heparin  infusion at 1000 units/hour. Check heparin  level in 8 hours, then daily once at least two levels are consecutively therapeutic. Monitor CBC daily while on heparin  infusion.   Annabella Banks,  PharmD Clinical Pharmacist 11/03/2023 9:33 PM

## 2023-11-03 NOTE — H&P (Incomplete)
 [place]   PATIENT NAME: Luis Wise    MR#:  [id number]  DATE OF BIRTH:  1941-02-28  DATE OF ADMISSION:  11/03/2023  PRIMARY CARE PHYSICIAN: Cleatus Arlyss RAMAN, MD   Patient is coming from: Home  REQUESTING/REFERRING PHYSICIAN: Viviann Mungo, MD  CHIEF COMPLAINT:   Chief Complaint  Patient presents with   Chest Pain    HISTORY OF PRESENT ILLNESS:  Luis Wise is a 83 y.o. male with medical history significant for type II diabetes mellitus, hypertension, coronary artery disease status post CABG, dyslipidemia, peripheral neuropathy and atrial fibrillation, who presented to the emergency room with acute onset of midsternal chest pain for the past week started after he had a 3 mile hike but has been fairly frequent since then.  He described it as pressure and graded 2-3/10 in severity with radiation across the chest without nausea or vomiting or diaphoresis.  It has been improving with sublingual medicine.  It became more and more frequent every day.  He was seen by cardiology a few days ago and was scheduled to have a heart catheterization tomorrow.  Today's pain became more severe and more frequent.  Used 7 doses of sublingual nitroglycerin  throughout the day today and therefore came to the ER.  No significant dyspnea or cough or wheezing or hemoptysis.  No leg pain or edema or recent travels or surgeries.  Normal bowel pain.  No dysuria, oliguria or hematuria or flank pain.  No other bleeding diathesis.  ED Course: When he came to the ER, vital signs showed a BP of 143/69 with otherwise normal vital signs.  Labs revealed unremarkable BMP.  High-sensitivity opponent was 112 and later 118.  CBC was normal. EKG as reviewed by me : EKG showed sinus rhythm with a rate of 67 with left bundle branch block. Imaging: Portable chest x-ray showed no acute cardiopulmonary disease.  The patient was given IV heparin  bolus and drip.  He will be admitted to a progressive unit bed for  further evaluation and management. PAST MEDICAL HISTORY:   Past Medical History:  Diagnosis Date   Allergy    Amoxicillin   Cataract 2017   Mild progression   Coronary artery disease    a. s/p 5-vessel CABG (LIMA-LAD, SVG-diag, sequential SVG-OM1-OM 2, & SVG-PDA)   Diabetes mellitus with complication (HCC)    Hyperlipidemia    Hypertension    Insomnia    neuropathy    bilateral feet   NSTEMI (non-ST elevated myocardial infarction) (HCC) 11/03/2023   Postoperative atrial fibrillation (HCC)     PAST SURGICAL HISTORY:   Past Surgical History:  Procedure Laterality Date   CARDIAC CATHETERIZATION     01/19/2020   COLONOSCOPY WITH PROPOFOL  N/A 04/01/2021   Procedure: COLONOSCOPY WITH PROPOFOL ;  Surgeon: Jinny Carmine, MD;  Location: [facility] ENDOSCOPY;  Service: Endoscopy;  Laterality: N/A;   CORONARY ARTERY BYPASS GRAFT N/A 01/29/2020   Procedure: CORONARY ARTERY BYPASS GRAFTING (CABG) TIMES FIVE USING LIMA to LAD; ENDOSCOPIC HARVESTED GREATER SAPHENOUS VEIN: SVG to PDA; SVG to sequenced OM1 & OM2.;  Surgeon: Fleeta Hanford Coy, MD;  Location: [facility] OR;  Service: Open Heart Surgery;  Laterality: N/A;   ENDOVEIN HARVEST OF GREATER SAPHENOUS VEIN Bilateral 01/29/2020   Procedure: ENDOVEIN HARVEST OF GREATER SAPHENOUS VEIN;  Surgeon: Fleeta Hanford Coy, MD;  Location: [facility] OR;  Service: Open Heart Surgery;  Laterality: Bilateral;   EYE SURGERY  Radial K   1997   LAMINECTOMY  1993  L5-S1   LEFT HEART CATH AND CORONARY ANGIOGRAPHY N/A 01/19/2020   Procedure: LEFT HEART CATH AND CORONARY ANGIOGRAPHY;  Surgeon: Darron Deatrice LABOR, MD;  Location: [facility] INVASIVE CV LAB;  Service: Cardiovascular;  Laterality: N/A;   MENISCUS REPAIR  1996   TEE WITHOUT CARDIOVERSION N/A 01/29/2020   Procedure: TRANSESOPHAGEAL ECHOCARDIOGRAM (TEE);  Surgeon: Fleeta Ochoa, Maude, MD;  Location: [facility] OR;  Service: Open Heart Surgery;  Laterality: N/A;   TONSILLECTOMY      SOCIAL HISTORY:   Social History   Tobacco Use    Smoking status: Never   Smokeless tobacco: Never  Substance Use Topics   Alcohol use: Not Currently    Alcohol/week: 0.0 standard drinks of alcohol    Comment: Very rare- occasionaly beer or wine     FAMILY HISTORY:   Family History  Problem Relation Age of Onset   Hypertension Mother    Diabetes Father    Cancer Neg Hx    COPD Neg Hx    Heart disease Neg Hx    Hyperlipidemia Neg Hx    Stroke Neg Hx    Colon cancer Neg Hx    Prostate cancer Neg Hx     DRUG ALLERGIES:   Allergies  Allergen Reactions   Ozempic  (0.25 Or 0.5 Mg-Dose) [Semaglutide (0.25 Or 0.5mg -Dos)]     Presumed cause of GI upset.     Amoxil [Amoxicillin] Rash    REVIEW OF SYSTEMS:   ROS As per history of present illness. All pertinent systems were reviewed above. Constitutional, HEENT, cardiovascular, respiratory, GI, GU, musculoskeletal, neuro, psychiatric, endocrine, integumentary and hematologic systems were reviewed and are otherwise negative/unremarkable except for positive findings mentioned above in the HPI.   MEDICATIONS AT HOME:   Prior to Admission medications   Medication Sig Start Date End Date Taking? Authorizing Provider  amLODipine  (NORVASC ) 10 MG tablet Take 1 tablet (10 mg total) by mouth daily. 02/09/23   Cleatus Arlyss RAMAN, MD  aspirin  EC 81 MG tablet Take 81 mg by mouth daily. Swallow whole.    [provider]  atorvastatin  (LIPITOR) 80 MG tablet Take 1 tablet (80 mg total) by mouth daily. 09/07/23   Gollan, Timothy J, MD  benzonatate  (TESSALON ) 200 MG capsule Take 1 capsule (200 mg total) by mouth 3 (three) times daily as needed. Patient not taking: Reported on 11/02/2023 07/27/23   Vincente Shivers, NP  carvedilol  (COREG ) 12.5 MG tablet Take 1 tablet (12.5 mg total) by mouth 2 (two) times daily. 09/07/23   Gollan, Timothy J, MD  cholecalciferol  (VITAMIN D3) 25 MCG (1000 UNIT) tablet Take 1,000 Units by mouth every evening.    [provider]  Coenzyme Q10 (COQ10) 100 MG CAPS  Take 100 mg by mouth every evening.    [provider]  Continuous Blood Gluc Transmit (DEXCOM G6 TRANSMITTER) MISC 1 Device by Does not apply route every 3 (three) months. E11.65 06/18/21   Trixie File, MD  cyanocobalamin  (VITAMIN B12) 1000 MCG tablet Take 1,000 mcg by mouth daily.    [provider]  dapagliflozin  propanediol (FARXIGA ) 10 MG TABS tablet TAKE 1 TABLET BY MOUTH EVERY DAY BEFORE BREAKFAST 12/16/22   Trixie File, MD  fenofibrate  160 MG tablet Take 1 tablet (160 mg total) by mouth daily. 02/09/23   Cleatus Arlyss RAMAN, MD  glucose blood (ONETOUCH ULTRA) test strip Use to check blood sugar daily. Dx E11.9 08/06/22   Cleatus Arlyss RAMAN, MD  GVOKE HYPOPEN  2-PACK 1 MG/0.2ML SOAJ INJECT 0.2 MG UNDER  SKIN AS NEEDED FOR LOW BLOOD SUGARS 06/21/23   Trixie File, MD  Insulin  Disposable Pump (OMNIPOD 5 G6 PODS, GEN 5,) MISC USE AS INSTRUCTED TO ADMINISTER INSULIN  07/30/22   Trixie File, MD  insulin  lispro (HUMALOG  KWIKPEN) 200 UNIT/ML KwikPen USE AS INSTRUCTED IN THE INSULIN  PUMP MAX DAILY 145 UNITS 03/22/23   Trixie File, MD  Insulin  Pen Needle 32G X 4 MM MISC Use 4x a day 05/02/21   Trixie File, MD  [facility] Omega-3 Krill Oil 350 MG CAPS Take 350 mg by mouth daily.    [provider]  metFORMIN  (GLUCOPHAGE ) 1000 MG tablet TAKE 1 TABLET BY MOUTH TWICE A DAY Patient taking differently: Take 1,500 mg by mouth daily with breakfast. 06/14/23   Cleatus Arlyss RAMAN, MD  Multiple Vitamins-Minerals (PRESERVISION AREDS 2 PO) Take 1 tablet by mouth 2 (two) times daily.     [provider]  nitroGLYCERIN  (NITROSTAT ) 0.4 MG SL tablet Place 1 tablet (0.4 mg total) under the tongue every 5 (five) minutes as needed for chest pain. 11/01/23 01/30/24  Loistine Sober, NP  sildenafil  (VIAGRA ) 100 MG tablet Take 1 tablet (100 mg total) by mouth as needed for erectile dysfunction. 02/09/23   Cleatus Arlyss RAMAN, MD  traZODone  (DESYREL ) 150 MG tablet Take 1 tablet  (150 mg total) by mouth daily. Patient taking differently: Take 150 mg by mouth at bedtime. 02/09/23   Cleatus Arlyss RAMAN, MD  TURMERIC CURCUMIN PO Take 1,500 mg by mouth in the morning and at bedtime.    [provider]  valsartan  (DIOVAN ) 160 MG tablet Take 1 tablet (160 mg total) by mouth daily. 09/07/23   Gollan, Timothy J, MD      VITAL SIGNS:  Blood pressure (!) 143/69, pulse 65, temperature 98.8 F (37.1 C), temperature source Oral, resp. rate 18, height 5' 9 (1.753 m), weight 81.8 kg, SpO2 97%.  PHYSICAL EXAMINATION:  Physical Exam  GENERAL:  83 y.o.-year-old  male patient lying in the bed with no acute distress.  EYES: Pupils equal, round, reactive to light and accommodation. No scleral icterus. Extraocular muscles intact.  HEENT: Head atraumatic, normocephalic. Oropharynx and nasopharynx clear.  NECK:  Supple, no jugular venous distention. No thyroid  enlargement, no tenderness.  LUNGS: Normal breath sounds bilaterally, no wheezing, rales,rhonchi or crepitation. No use of accessory muscles of respiration.  CARDIOVASCULAR: Regular rate and rhythm, S1, S2 normal. No murmurs, rubs, or gallops.  ABDOMEN: Soft, nondistended, nontender. Bowel sounds present. No organomegaly or mass.  EXTREMITIES: No pedal edema, cyanosis, or clubbing.  NEUROLOGIC: Cranial nerves II through XII are intact. Muscle strength 5/5 in all extremities. Sensation intact. Gait not checked.  PSYCHIATRIC: The patient is alert and oriented x 3.  Normal affect and good eye contact. SKIN: No obvious rash, lesion, or ulcer.   LABORATORY PANEL:   CBC Recent Labs  Lab 11/03/23 2044  WBC 4.7  HGB 13.7  HCT 40.2  PLT 228   ------------------------------------------------------------------------------------------------------------------  Chemistries  Recent Labs  Lab 11/03/23 2044  NA 137  K 4.1  CL 105  CO2 24  GLUCOSE 135*  BUN 23  CREATININE 1.18  CALCIUM  9.9    ------------------------------------------------------------------------------------------------------------------  Cardiac Enzymes No results for input(s): TROPONINI in the last 168 hours. ------------------------------------------------------------------------------------------------------------------  RADIOLOGY:  DG Chest 1 View Result Date: 11/03/2023 CLINICAL DATA:  Chest pain for weeks, worse tonight. EXAM: CHEST  1 VIEW COMPARISON:  02/28/2020 FINDINGS: Postoperative changes in the mediastinum. Heart size and pulmonary vascularity are normal. Lungs are  clear. No pleural effusion or pneumothorax. Mediastinal contours appear intact. Calcification of the aorta. IMPRESSION: No active disease. Electronically Signed   By: Elsie Gravely M.D.   On: 11/03/2023 20:37      IMPRESSION AND PLAN:  Assessment and Plan: * NSTEMI (non-ST elevated myocardial infarction) [facility]) - The patient will be admitted to a progressive unit bed. - Will follow serial troponins and EKGs. - The patient will be placed on aspirin  as well as p.r.n. sublingual nitroglycerin  and morphine  sulfate for pain. - He will be placed on high-dose statin therapy and continued on beta-blocker therapy with Coreg . - We will obtain a cardiology consult in a.m. for further cardiac risk stratification. - I notified [facility] group about the patient. - Continue IV heparin  and obtain a 2D echo.   Type 2 diabetes mellitus without complication, with long-term current use of insulin  (HCC) - The patient will placed on supplemental coverage with NovoLog . - Will continue Farxiga  and the coverage.  Dyslipidemia - Continue statin therapy check fasting lipids.  Essential hypertension - Will continue antihypertensive therapy.       DVT prophylaxis: I heparin  Advanced Care Planning:  Code Status: full code. Family Communication:  The plan of care was discussed in details with the patient (and family). I answered all questions. The  patient agreed to proceed with the above mentioned plan. Further management will depend upon hospital course. Disposition Plan: Back to previous home environment Consults called: Cardiology All the records are reviewed and case discussed with ED provider.  Status is: Inpatient   At the time of the admission, it appears that the appropriate admission status for this patient is inpatient.  This is judged to be reasonable and necessary in order to provide the required intensity of service to ensure the patient's safety given the presenting symptoms, physical exam findings and initial radiographic and laboratory data in the context of comorbid conditions.  The patient requires inpatient status due to high intensity of service, high risk of further deterioration and high frequency of surveillance required.  I certify that at the time of admission, it is my clinical judgment that the patient will require inpatient hospital care extending more than 2 midnights.                            Dispo: The patient is from: Home              Anticipated d/c is to: Home              Patient currently is not medically stable to d/c.              Difficult to place patient: No  Madison DELENA Peaches M.D on 11/04/2023 at 12:18 AM  [facility]   From 7 PM-7 AM, contact night-coverage [email or web address]  CC: Primary care physician; Cleatus Arlyss RAMAN, MD

## 2023-11-03 NOTE — ED Triage Notes (Signed)
 Patient ambulatory to triage with complaints of chest pain that he states has been ongoing for weeks but worsened tonight. Rates 2/10 and dull currently. Patient took 2SL nitroglycerin  PTA. Patient states it did help with his pain. Is scheduled for heart cath tomorrow. No thinners.

## 2023-11-03 NOTE — Telephone Encounter (Signed)
 Called patient regarding recent call. Patient has been having unstable angina, and has taken 9 nitroglycerin  since Monday for chest pain. Patient wife Colie) wants to know if this is safe to proceed with the heart cath, I did tell her that it is generally safe to do this even when taking nitroglycerin . Patients wife and patient's son wants to know if the patient needs to come in before tomorrow for closer monitoring and being put on a nitroglycerin  drip to control the chest pain before the heart cath that's scheduled for tomorrow. They state they would rather not do that but if they have to they will so they don't have to take so much nitroglycerin .    Will forward to MD for further review.

## 2023-11-03 NOTE — Telephone Encounter (Signed)
   Pt c/o of Chest Pain: STAT if active CP, including tightness, pressure, jaw pain, radiating pain to shoulder/upper arm/back, CP unrelieved by Nitro. Symptoms reported of SOB, nausea, vomiting, sweating.  1. Are you having CP right now? Right now he is fine. He had a nitro at 4AM    2. Are you experiencing any other symptoms (ex. SOB, nausea, vomiting, sweating)? No    3. Is your CP continuous or coming and going? Continuous until he takes a nitroglycerin .    4. Have you taken Nitroglycerin ? Yes, has taken 9 since Monday when he was seen by Barnie Hila. Scheduled for cath tomorrow.    5. How long have you been experiencing CP? Started over the weekend, came in on 08/25 to see Barnie Hila.     6. If NO CP at time of call then end call with telling Pt to call back or call 911 if Chest pain returns prior to return call from triage team.    Patient's wife wants to know if it is safe for him to have a cath tomorrow, he has taken 9 nitro's since Monday 08/25 and last one was at 4AM this morning. P cv di

## 2023-11-03 NOTE — Progress Notes (Signed)
 Last read by [name] at 4:28PM on 11/02/2023.  Last read by Emerick Holms at 6:15PM on 11/02/2023.

## 2023-11-03 NOTE — ED Provider Notes (Signed)
 [facility] Provider Note    Event Date/Time   First MD Initiated Contact with Patient 11/03/23 2029     (approximate)   History   Chief Complaint: Chest Pain   HPI  Luis Wise is a 83 y.o. male with a history of diabetes, hypertension, CABG x 5 who comes ED complaining of worsening chest pain for the past week which initially started after a 3 mile hike but has been frequent, improving with nitroglycerin  but becoming more and more frequent each day.  He saw cardiology a few days ago and was scheduled to have a heart catheterization tomorrow, but today pain has continued to become more severe and more frequent.  He has used 11 doses of sublingual nitroglycerin  throughout the day today and so came to the ED at the advice of his cardiology clinic due to the accelerating symptoms.  Currently pain is 1/10 after taking 2 nitroglycerin  at home prior to coming to the ED.  Denies shortness of breath or radiation.        Past Medical History:  Diagnosis Date   Allergy    Amoxicillin   Cataract 2017   Mild progression   Coronary artery disease    a. s/p 5-vessel CABG (LIMA-LAD, SVG-diag, sequential SVG-OM1-OM 2, & SVG-PDA)   Diabetes mellitus with complication (HCC)    Hyperlipidemia    Hypertension    Insomnia    neuropathy    bilateral feet   Postoperative atrial fibrillation [facility])     Current Outpatient Rx   Order #: [id number] Class: Normal   Order #: [id number] Class: Historical Med   Order #: [id number] Class: Normal   Order #: [id number] Class: Normal   Order #: [id number] Class: Normal   Order #: [id number] Class: Historical Med   Order #: [id number] Class: Historical Med   Order #: [id number] Class: Normal   Order #: [id number] Class: Historical Med   Order #: [id number] Class: Normal   Order #: [id number] Class: Normal   Order #: [id number] Class: Normal   Order #: [id number] Class: Normal   Order #: [id number] Class: Normal   Order #: [id number] Class: Normal    Order #: [id number] Class: Normal   Order #: [id number] Class: Historical Med   Order #: [id number] Class: Normal   Order #: [id number] Class: Historical Med   Order #: [id number] Class: Normal   Order #: [id number] Class: Normal   Order #: [id number] Class: Normal   Order #: [id number] Class: Historical Med   Order #: [id number] Class: Normal    Past Surgical History:  Procedure Laterality Date   CARDIAC CATHETERIZATION     01/19/2020   COLONOSCOPY WITH PROPOFOL  N/A 04/01/2021   Procedure: COLONOSCOPY WITH PROPOFOL ;  Surgeon: Jinny Carmine, MD;  Location: [facility] ENDOSCOPY;  Service: Endoscopy;  Laterality: N/A;   CORONARY ARTERY BYPASS GRAFT N/A 01/29/2020   Procedure: CORONARY ARTERY BYPASS GRAFTING (CABG) TIMES FIVE USING LIMA to LAD; ENDOSCOPIC HARVESTED GREATER SAPHENOUS VEIN: SVG to PDA; SVG to sequenced OM1 & OM2.;  Surgeon: Fleeta Hanford Coy, MD;  Location: [facility] OR;  Service: Open Heart Surgery;  Laterality: N/A;   ENDOVEIN HARVEST OF GREATER SAPHENOUS VEIN Bilateral 01/29/2020   Procedure: ENDOVEIN HARVEST OF GREATER SAPHENOUS VEIN;  Surgeon: Fleeta Hanford Coy, MD;  Location: [facility] OR;  Service: Open Heart Surgery;  Laterality: Bilateral;   EYE SURGERY  Radial K   1997   LAMINECTOMY  1993   L5-S1   LEFT HEART CATH AND CORONARY ANGIOGRAPHY N/A 01/19/2020   Procedure: LEFT HEART CATH AND CORONARY ANGIOGRAPHY;  Surgeon: Darron Deatrice LABOR, MD;  Location: [facility]  INVASIVE CV LAB;  Service: Cardiovascular;  Laterality: N/A;   MENISCUS REPAIR  1996   TEE WITHOUT CARDIOVERSION N/A 01/29/2020   Procedure: TRANSESOPHAGEAL ECHOCARDIOGRAM (TEE);  Surgeon: Fleeta Ochoa, Maude, MD;  Location: [facility] OR;  Service: Open Heart Surgery;  Laterality: N/A;   TONSILLECTOMY      Physical Exam   Triage Vital Signs: ED Triage Vitals  Encounter Vitals Group     BP 11/03/23 2028 (!) 143/69     Girls Systolic BP Percentile --      Girls Diastolic BP Percentile --      Boys Systolic BP Percentile --      Boys Diastolic BP Percentile  --      Pulse Rate 11/03/23 2028 65     Resp 11/03/23 2028 18     Temp 11/03/23 2028 98.8 F (37.1 C)     Temp Source 11/03/23 2028 Oral     SpO2 11/03/23 2028 97 %     Weight 11/03/23 2026 180 lb 5.4 oz (81.8 kg)     Height 11/03/23 2026 5' 9 (1.753 m)     Head Circumference --      Peak Flow --      Pain Score 11/03/23 2026 2     Pain Loc --      Pain Education --      Exclude from Growth Chart --     Most recent vital signs: Vitals:   11/03/23 2028  BP: (!) 143/69  Pulse: 65  Resp: 18  Temp: 98.8 F (37.1 C)  SpO2: 97%    General: Awake, no distress.  CV:  Good peripheral perfusion.  Regular rate rhythm Resp:  Normal effort.  Clear lungs Abd:  No distention.  Soft nontender Other:  No lower extremity edema   ED Results / Procedures / Treatments   Labs (all labs ordered are listed, but only abnormal results are displayed) Labs Reviewed  BASIC METABOLIC PANEL WITH GFR - Abnormal; Notable for the following components:      Result Value   Glucose, Bld 135 (*)    All other components within normal limits  HEPARIN  LEVEL (UNFRACTIONATED) - Abnormal; Notable for the following components:   Heparin  Unfractionated <0.10 (*)    All other components within normal limits  TROPONIN I (HIGH SENSITIVITY) - Abnormal; Notable for the following components:   Troponin I (High Sensitivity) 112 (*)    All other components within normal limits  CBC  APTT  CBC  TROPONIN I (HIGH SENSITIVITY)     EKG Interpreted by me Sinus rhythm rate of 67.  Normal axis.  Left bundle branch block.  Negative for STEMI by Sgarbossa criteria.   RADIOLOGY Chest x-ray interpreted by me, unremarkable.  Radiology report reviewed   PROCEDURES:  .Critical Care  Performed by: Viviann Pastor, MD Authorized by: Viviann Pastor, MD   Critical care provider statement:    Critical care time (minutes):  35   Critical care time was exclusive of:  Separately billable procedures and treating  other patients   Critical care was necessary to treat or prevent imminent or life-threatening deterioration of the following conditions:  Cardiac failure   Critical care was time spent personally by me on the following activities:  Development of treatment plan with patient or surrogate, discussions with consultants, evaluation of patient's response to treatment, examination of patient, obtaining history from patient or surrogate, ordering and performing treatments and interventions, ordering and review of laboratory studies, ordering and review of  radiographic studies, pulse oximetry, re-evaluation of patient's condition and review of old charts   Care discussed with: admitting provider      MEDICATIONS ORDERED IN ED: Medications  nitroGLYCERIN  50 mg in dextrose  5 % 250 mL (0.2 mg/mL) infusion (has no administration in time range)  heparin  ADULT infusion 100 units/mL (25000 units/250mL) (1,000 Units/hr Intravenous New Bag/Given 11/03/23 2203)  heparin  bolus via infusion 4,000 Units (4,000 Units Intravenous Bolus from Bag 11/03/23 2203)     IMPRESSION / MDM / ASSESSMENT AND PLAN / ED COURSE  I reviewed the triage vital signs and the nursing notes.  DDx: Unstable angina/NSTEMI, AKI, electrolyte derangement, pneumothorax, pneumonia, pleural effusion, pulmonary edema  Patient's presentation is most consistent with acute presentation with potential threat to life or bodily function.  Patient presents with accelerating chest pain symptoms, concerning for unstable angina.  Will start nitroglycerin  infusion to titrate as well as heparin  infusion and plan to admit, maintain on telemetry monitoring pending further cardiology evaluation.   Clinical Course as of 11/03/23 2240  Wed Nov 03, 2023  2240 Case discussed with hospitalist [PS]    Clinical Course User Index [PS] Viviann Pastor, MD     FINAL CLINICAL IMPRESSION(S) / ED DIAGNOSES   Final diagnoses:  Unstable angina (HCC)  Type 2  diabetes mellitus without complication, with long-term current use of insulin  (HCC)     Rx / DC Orders   ED Discharge Orders     None        Note:  This document was prepared using Dragon voice recognition software and may include unintentional dictation errors.   Viviann Pastor, MD 11/03/23 2240

## 2023-11-03 NOTE — ED Notes (Signed)
 Pt denies CP at this time. Will hold nitroglycerin  gtt per Viviann, MD. Pt educated to utilize call light if chest pain returns w/ verbalization of understanding. Call light within reach, pt's wife at bedside.  Primary RN made aware.

## 2023-11-03 NOTE — ED Notes (Addendum)
Blue top tube collected and sent to lab.

## 2023-11-03 NOTE — Telephone Encounter (Signed)
 Patient and his wife were instructed on how to turn the exercise mode mode on ad off,  and why it is better to do this, than reduce pump settings by 50%.  They did they above procedure correctly and reported good understanding of how to do this and had no final questions

## 2023-11-03 NOTE — Telephone Encounter (Signed)
 Called patient and notified him of the following from Barnie Hila, NP.  If patient is having accelerating chest pain he should go to the ED.    Thank you!   DW  Patient verbalizes understanding. Patient denies any current chest pain and states that he does not want to go to the ED at this time. Patient instructed to go to the ED if he develops any chest pain or other symptoms. Patient verbalizes understanding.

## 2023-11-04 ENCOUNTER — Encounter (HOSPITAL_COMMUNITY): Payer: Self-pay

## 2023-11-04 ENCOUNTER — Encounter
Admission: EM | Disposition: A | Discharge status: Other Acute Inpatient Hospital | Payer: Self-pay | Source: Home / Self Care | Attending: Student

## 2023-11-04 ENCOUNTER — Inpatient Hospital Stay (HOSPITAL_COMMUNITY)
Admission: EM | Admit: 2023-11-04 | Discharge: 2023-11-06 | DRG: 322 | Disposition: A | Discharge status: Home / Self Care | Source: Other Acute Inpatient Hospital | Attending: Cardiovascular Disease | Admitting: Cardiovascular Disease

## 2023-11-04 ENCOUNTER — Ambulatory Visit: Admission: RE | Admit: 2023-11-04 | Source: Home / Self Care | Admitting: Cardiology

## 2023-11-04 ENCOUNTER — Inpatient Hospital Stay: Admit: 2023-11-04

## 2023-11-04 DIAGNOSIS — Z833 Family history of diabetes mellitus: Secondary | ICD-10-CM

## 2023-11-04 DIAGNOSIS — I25810 Atherosclerosis of coronary artery bypass graft(s) without angina pectoris: Secondary | ICD-10-CM | POA: Diagnosis present

## 2023-11-04 DIAGNOSIS — I200 Unstable angina: Secondary | ICD-10-CM

## 2023-11-04 DIAGNOSIS — E785 Hyperlipidemia, unspecified: Secondary | ICD-10-CM | POA: Diagnosis present

## 2023-11-04 DIAGNOSIS — Z880 Allergy status to penicillin: Secondary | ICD-10-CM

## 2023-11-04 DIAGNOSIS — I214 Non-ST elevation (NSTEMI) myocardial infarction: Secondary | ICD-10-CM | POA: Diagnosis present

## 2023-11-04 DIAGNOSIS — I255 Ischemic cardiomyopathy: Secondary | ICD-10-CM | POA: Diagnosis present

## 2023-11-04 DIAGNOSIS — I25708 Atherosclerosis of coronary artery bypass graft(s), unspecified, with other forms of angina pectoris: Secondary | ICD-10-CM | POA: Diagnosis not present

## 2023-11-04 DIAGNOSIS — I2510 Atherosclerotic heart disease of native coronary artery without angina pectoris: Secondary | ICD-10-CM | POA: Diagnosis present

## 2023-11-04 DIAGNOSIS — I2582 Chronic total occlusion of coronary artery: Secondary | ICD-10-CM | POA: Diagnosis present

## 2023-11-04 DIAGNOSIS — Z955 Presence of coronary angioplasty implant and graft: Secondary | ICD-10-CM

## 2023-11-04 DIAGNOSIS — E1142 Type 2 diabetes mellitus with diabetic polyneuropathy: Secondary | ICD-10-CM | POA: Diagnosis present

## 2023-11-04 DIAGNOSIS — Z7902 Long term (current) use of antithrombotics/antiplatelets: Secondary | ICD-10-CM

## 2023-11-04 DIAGNOSIS — L7632 Postprocedural hematoma of skin and subcutaneous tissue following other procedure: Secondary | ICD-10-CM | POA: Diagnosis not present

## 2023-11-04 DIAGNOSIS — E119 Type 2 diabetes mellitus without complications: Secondary | ICD-10-CM | POA: Diagnosis present

## 2023-11-04 DIAGNOSIS — I25110 Atherosclerotic heart disease of native coronary artery with unstable angina pectoris: Secondary | ICD-10-CM

## 2023-11-04 DIAGNOSIS — Z8249 Family history of ischemic heart disease and other diseases of the circulatory system: Secondary | ICD-10-CM | POA: Diagnosis not present

## 2023-11-04 DIAGNOSIS — I6523 Occlusion and stenosis of bilateral carotid arteries: Secondary | ICD-10-CM | POA: Diagnosis present

## 2023-11-04 DIAGNOSIS — Y840 Cardiac catheterization as the cause of abnormal reaction of the patient, or of later complication, without mention of misadventure at the time of the procedure: Secondary | ICD-10-CM | POA: Diagnosis not present

## 2023-11-04 DIAGNOSIS — I480 Paroxysmal atrial fibrillation: Secondary | ICD-10-CM | POA: Diagnosis present

## 2023-11-04 DIAGNOSIS — Z888 Allergy status to other drugs, medicaments and biological substances status: Secondary | ICD-10-CM

## 2023-11-04 DIAGNOSIS — I110 Hypertensive heart disease with heart failure: Secondary | ICD-10-CM | POA: Diagnosis present

## 2023-11-04 DIAGNOSIS — I10 Essential (primary) hypertension: Secondary | ICD-10-CM | POA: Diagnosis not present

## 2023-11-04 DIAGNOSIS — R079 Chest pain, unspecified: Secondary | ICD-10-CM | POA: Diagnosis present

## 2023-11-04 DIAGNOSIS — Z79899 Other long term (current) drug therapy: Secondary | ICD-10-CM

## 2023-11-04 DIAGNOSIS — Z7982 Long term (current) use of aspirin: Secondary | ICD-10-CM

## 2023-11-04 DIAGNOSIS — R7989 Other specified abnormal findings of blood chemistry: Secondary | ICD-10-CM | POA: Diagnosis not present

## 2023-11-04 DIAGNOSIS — I5022 Chronic systolic (congestive) heart failure: Secondary | ICD-10-CM | POA: Diagnosis present

## 2023-11-04 DIAGNOSIS — Z794 Long term (current) use of insulin: Secondary | ICD-10-CM | POA: Diagnosis not present

## 2023-11-04 DIAGNOSIS — I447 Left bundle-branch block, unspecified: Secondary | ICD-10-CM | POA: Diagnosis present

## 2023-11-04 DIAGNOSIS — G4700 Insomnia, unspecified: Secondary | ICD-10-CM | POA: Diagnosis present

## 2023-11-04 DIAGNOSIS — Z7984 Long term (current) use of oral hypoglycemic drugs: Secondary | ICD-10-CM | POA: Diagnosis not present

## 2023-11-04 HISTORY — PX: LEFT HEART CATH AND CORS/GRAFTS ANGIOGRAPHY: CATH118250

## 2023-11-04 LAB — BASIC METABOLIC PANEL WITH GFR
Anion gap: 12 (ref 5–15)
BUN: 19 mg/dL (ref 8–23)
CO2: 23 mmol/L (ref 22–32)
Calcium: 9.4 mg/dL (ref 8.9–10.3)
Chloride: 107 mmol/L (ref 98–111)
Creatinine, Ser: 0.94 mg/dL (ref 0.61–1.24)
GFR, Estimated: >60 mL/min (ref >60)
Glucose, Bld: 102 mg/dL — ABNORMAL HIGH (ref 70–99)
Potassium: 3.7 mmol/L (ref 3.5–5.1)
Sodium: 142 mmol/L (ref 135–145)

## 2023-11-04 LAB — CBC
HCT: 39.7 % (ref 39.0–52.0)
Hemoglobin: 13.8 g/dL (ref 13.0–17.0)
MCH: 29.6 pg (ref 26.0–34.0)
MCHC: 34.8 g/dL (ref 30.0–36.0)
MCV: 85.2 fL (ref 80.0–100.0)
Platelets: 216 K/uL (ref 150–400)
RBC: 4.66 MIL/uL (ref 4.22–5.81)
RDW: 14.6 % (ref 11.5–15.5)
WBC: 4.9 K/uL (ref 4.0–10.5)
nRBC: 0 % (ref 0.0–0.2)

## 2023-11-04 LAB — TROPONIN I (HIGH SENSITIVITY)
Troponin I (High Sensitivity): 127 ng/L (ref <18)
Troponin I (High Sensitivity): 143 ng/L (ref <18)

## 2023-11-04 LAB — LIPID PANEL
Cholesterol: 94 mg/dL (ref 0–200)
HDL: 25 mg/dL — ABNORMAL LOW (ref >40)
LDL Cholesterol: 43 mg/dL (ref 0–99)
Total CHOL/HDL Ratio: 3.8 ratio
Triglycerides: 130 mg/dL (ref <150)
VLDL: 26 mg/dL (ref 0–40)

## 2023-11-04 LAB — CBG MONITORING, ED: Glucose-Capillary: 73 mg/dL (ref 70–99)

## 2023-11-04 LAB — PROTIME-INR
INR: 1.1 (ref 0.8–1.2)
Prothrombin Time: 14.5 s (ref 11.4–15.2)

## 2023-11-04 LAB — PHOSPHORUS: Phosphorus: 3.2 mg/dL (ref 2.5–4.6)

## 2023-11-04 LAB — GLUCOSE, CAPILLARY
Glucose-Capillary: 91 mg/dL (ref 70–99)
Glucose-Capillary: 141 mg/dL — ABNORMAL HIGH (ref 70–99)

## 2023-11-04 LAB — MAGNESIUM: Magnesium: 2.4 mg/dL (ref 1.7–2.4)

## 2023-11-04 LAB — HEPARIN LEVEL (UNFRACTIONATED): Heparin Unfractionated: 0.27 [IU]/mL — ABNORMAL LOW (ref 0.30–0.70)

## 2023-11-04 SURGERY — LEFT HEART CATH AND CORS/GRAFTS ANGIOGRAPHY
Anesthesia: Moderate Sedation | Laterality: Left

## 2023-11-04 MED ORDER — HYDRALAZINE HCL 20 MG/ML IJ SOLN
INTRAMUSCULAR | Status: DC | PRN
  Administered 2023-11-04: 10 mg via INTRAVENOUS

## 2023-11-04 MED ORDER — VERAPAMIL HCL 2.5 MG/ML IV SOLN
INTRAVENOUS | Status: DC | PRN
Start: 2023-11-04 — End: 2023-11-04
  Administered 2023-11-04: 2.5 mg via INTRAVENOUS

## 2023-11-04 MED ORDER — CLOPIDOGREL BISULFATE 75 MG PO TABS
75.0000 mg | ORAL_TABLET | Freq: Every day | ORAL | Status: DC
Start: 2023-11-05 — End: 2023-11-06
  Administered 2023-11-05 – 2023-11-06 (×2): 75 mg via ORAL
  Filled 2023-11-04 (×2): qty 1

## 2023-11-04 MED ORDER — ISOSORBIDE MONONITRATE ER 30 MG PO TB24: 30.0000 mg | ORAL_TABLET | Freq: Every evening | ORAL | Status: DC

## 2023-11-04 MED ORDER — CLOPIDOGREL BISULFATE 300 MG PO TABS
300.0000 mg | ORAL_TABLET | Freq: Once | ORAL | Status: AC
  Administered 2023-11-04: 300 mg via ORAL

## 2023-11-04 MED ORDER — HEPARIN (PORCINE) 25000 UT/250ML-% IV SOLN: 1200.0000 [IU]/h | INTRAVENOUS | Status: DC

## 2023-11-04 MED ORDER — INSULIN ASPART 100 UNIT/ML IJ SOLN: 0.0000 [IU] | Freq: Every day | INTRAMUSCULAR | Status: DC

## 2023-11-04 MED ORDER — NITROGLYCERIN IN D5W 200-5 MCG/ML-% IV SOLN: 0.0000 ug/min | INTRAVENOUS | Status: DC

## 2023-11-04 MED ORDER — LIDOCAINE HCL (PF) 1 % IJ SOLN
INTRAMUSCULAR | Status: DC | PRN
Start: 2023-11-04 — End: 2023-11-04
  Administered 2023-11-04: 5 mL

## 2023-11-04 MED ORDER — ISOSORBIDE MONONITRATE ER 60 MG PO TB24: 30.0000 mg | ORAL_TABLET | Freq: Every evening | ORAL | Status: DC

## 2023-11-04 MED ORDER — PROSIGHT PO TABS
1.0000 | ORAL_TABLET | Freq: Every day | ORAL | Status: DC
  Administered 2023-11-05 – 2023-11-06 (×2): 1 via ORAL
  Filled 2023-11-04 (×3): qty 1

## 2023-11-04 MED ORDER — HEPARIN SODIUM (PORCINE) 1000 UNIT/ML IJ SOLN
INTRAMUSCULAR | Status: DC | PRN
  Administered 2023-11-04: 4000 [IU] via INTRAVENOUS

## 2023-11-04 MED ORDER — ATORVASTATIN CALCIUM 80 MG PO TABS
80.0000 mg | ORAL_TABLET | Freq: Every day | ORAL | Status: DC
  Administered 2023-11-05 – 2023-11-06 (×2): 80 mg via ORAL
  Filled 2023-11-04 (×2): qty 1

## 2023-11-04 MED ORDER — SODIUM CHLORIDE 0.9% FLUSH: 3.0000 mL | Freq: Two times a day (BID) | INTRAVENOUS | Status: DC

## 2023-11-04 MED ORDER — FENTANYL CITRATE (PF) 100 MCG/2ML IJ SOLN
INTRAMUSCULAR | Status: DC | PRN
  Administered 2023-11-04: 25 ug via INTRAVENOUS

## 2023-11-04 MED ORDER — FENTANYL CITRATE (PF) 100 MCG/2ML IJ SOLN
INTRAMUSCULAR | Status: AC
Start: 2023-11-04 — End: 2023-11-04
  Filled 2023-11-04: qty 2

## 2023-11-04 MED ORDER — SODIUM CHLORIDE 0.9 % IV SOLN: 250.0000 mL | INTRAVENOUS | Status: DC | PRN

## 2023-11-04 MED ORDER — ASPIRIN 81 MG PO TBEC
81.0000 mg | DELAYED_RELEASE_TABLET | Freq: Every day | ORAL | Status: DC
  Administered 2023-11-06: 81 mg via ORAL
  Filled 2023-11-04: qty 1

## 2023-11-04 MED ORDER — IOHEXOL 300 MG/ML  SOLN
INTRAMUSCULAR | Status: DC | PRN
  Administered 2023-11-04: 69 mL

## 2023-11-04 MED ORDER — LABETALOL HCL 5 MG/ML IV SOLN: 10.0000 mg | INTRAVENOUS | Status: DC | PRN

## 2023-11-04 MED ORDER — ONDANSETRON HCL 4 MG/2ML IJ SOLN: 4.0000 mg | Freq: Four times a day (QID) | INTRAMUSCULAR | Status: DC | PRN

## 2023-11-04 MED ORDER — FENOFIBRATE 160 MG PO TABS
160.0000 mg | ORAL_TABLET | Freq: Every day | ORAL | Status: DC
  Administered 2023-11-05 – 2023-11-06 (×2): 160 mg via ORAL
  Filled 2023-11-04 (×2): qty 1

## 2023-11-04 MED ORDER — HEPARIN BOLUS VIA INFUSION
1200.0000 [IU] | Freq: Once | INTRAVENOUS | Status: AC
  Administered 2023-11-04: 1200 [IU] via INTRAVENOUS
  Filled 2023-11-04: qty 1200

## 2023-11-04 MED ORDER — AMLODIPINE BESYLATE 10 MG PO TABS
10.0000 mg | ORAL_TABLET | Freq: Every day | ORAL | Status: DC
  Administered 2023-11-05 – 2023-11-06 (×2): 10 mg via ORAL
  Filled 2023-11-04 (×2): qty 1

## 2023-11-04 MED ORDER — HEPARIN (PORCINE) 25000 UT/250ML-% IV SOLN
1300.0000 [IU]/h | INTRAVENOUS | Status: DC
  Administered 2023-11-04: 1200 [IU]/h via INTRAVENOUS
  Filled 2023-11-04: qty 250

## 2023-11-04 MED ORDER — MIDAZOLAM HCL 2 MG/2ML IJ SOLN
INTRAMUSCULAR | Status: AC
  Filled 2023-11-04: qty 2

## 2023-11-04 MED ORDER — FREE WATER
500.0000 mL | Freq: Once | Status: AC
  Administered 2023-11-04: 500 mL via ORAL

## 2023-11-04 MED ORDER — SODIUM CHLORIDE 0.9% FLUSH: 3.0000 mL | INTRAVENOUS | Status: DC | PRN

## 2023-11-04 MED ORDER — ACETAMINOPHEN 325 MG PO TABS
650.0000 mg | ORAL_TABLET | ORAL | Status: DC | PRN
  Administered 2023-11-04: 650 mg via ORAL
  Filled 2023-11-04: qty 2

## 2023-11-04 MED ORDER — FREE WATER
500.0000 mL | Freq: Once | Status: DC
  Filled 2023-11-04: qty 500

## 2023-11-04 MED ORDER — CLOPIDOGREL BISULFATE 75 MG PO TABS
ORAL_TABLET | ORAL | Status: AC
  Filled 2023-11-04: qty 4

## 2023-11-04 MED ORDER — HEPARIN SODIUM (PORCINE) 1000 UNIT/ML IJ SOLN
INTRAMUSCULAR | Status: AC
  Filled 2023-11-04: qty 10

## 2023-11-04 MED ORDER — VITAMIN B-12 1000 MCG PO TABS
1000.0000 ug | ORAL_TABLET | Freq: Every day | ORAL | Status: DC
  Administered 2023-11-05 – 2023-11-06 (×2): 1000 ug via ORAL
  Filled 2023-11-04 (×2): qty 1

## 2023-11-04 MED ORDER — MIDAZOLAM HCL 2 MG/2ML IJ SOLN
INTRAMUSCULAR | Status: DC | PRN
  Administered 2023-11-04: 1 mg via INTRAVENOUS

## 2023-11-04 MED ORDER — INSULIN ASPART 100 UNIT/ML IJ SOLN: 0.0000 [IU] | Freq: Three times a day (TID) | INTRAMUSCULAR | Status: DC

## 2023-11-04 MED ORDER — INSULIN ASPART 100 UNIT/ML IJ SOLN
0.0000 [IU] | Freq: Three times a day (TID) | INTRAMUSCULAR | Status: DC
  Administered 2023-11-05 – 2023-11-06 (×2): 3 [IU] via SUBCUTANEOUS

## 2023-11-04 MED ORDER — HEPARIN (PORCINE) IN NACL 1000-0.9 UT/500ML-% IV SOLN
INTRAVENOUS | Status: AC
  Filled 2023-11-04: qty 1000

## 2023-11-04 MED ORDER — VERAPAMIL HCL 2.5 MG/ML IV SOLN
INTRAVENOUS | Status: AC
  Filled 2023-11-04: qty 2

## 2023-11-04 MED ORDER — HEPARIN (PORCINE) IN NACL 2000-0.9 UNIT/L-% IV SOLN
INTRAVENOUS | Status: DC | PRN
Start: 2023-11-04 — End: 2023-11-04
  Administered 2023-11-04: 1000 mL

## 2023-11-04 MED ORDER — HYDRALAZINE HCL 20 MG/ML IJ SOLN
INTRAMUSCULAR | Status: AC
  Filled 2023-11-04: qty 1

## 2023-11-04 MED ORDER — ASPIRIN 81 MG PO CHEW: 81.0000 mg | CHEWABLE_TABLET | ORAL | Status: DC

## 2023-11-04 MED ORDER — CARVEDILOL 12.5 MG PO TABS
12.5000 mg | ORAL_TABLET | Freq: Two times a day (BID) | ORAL | Status: DC
  Administered 2023-11-04 – 2023-11-06 (×4): 12.5 mg via ORAL
  Filled 2023-11-04 (×4): qty 1

## 2023-11-04 MED ORDER — CLOPIDOGREL BISULFATE 75 MG PO TABS: 75.0000 mg | ORAL_TABLET | Freq: Once | ORAL | Status: DC

## 2023-11-04 MED ORDER — NITROGLYCERIN IN D5W 200-5 MCG/ML-% IV SOLN
INTRAVENOUS | Status: AC
  Filled 2023-11-04: qty 250

## 2023-11-04 MED ORDER — NITROGLYCERIN 0.4 MG SL SUBL: 0.4000 mg | SUBLINGUAL_TABLET | SUBLINGUAL | Status: DC | PRN

## 2023-11-04 MED ORDER — IRBESARTAN 150 MG PO TABS
150.0000 mg | ORAL_TABLET | Freq: Every day | ORAL | Status: DC
  Administered 2023-11-05 – 2023-11-06 (×2): 150 mg via ORAL
  Filled 2023-11-04 (×2): qty 1

## 2023-11-04 MED ORDER — VITAMIN D 25 MCG (1000 UNIT) PO TABS
1000.0000 [IU] | ORAL_TABLET | Freq: Every day | ORAL | Status: DC
  Administered 2023-11-05: 1000 [IU] via ORAL
  Filled 2023-11-04: qty 1

## 2023-11-04 MED ORDER — LIDOCAINE HCL 1 % IJ SOLN
INTRAMUSCULAR | Status: AC
  Filled 2023-11-04: qty 20

## 2023-11-04 MED ORDER — DAPAGLIFLOZIN PROPANEDIOL 10 MG PO TABS
10.0000 mg | ORAL_TABLET | Freq: Every day | ORAL | Status: DC
  Administered 2023-11-05 – 2023-11-06 (×2): 10 mg via ORAL
  Filled 2023-11-04 (×2): qty 1

## 2023-11-04 MED ORDER — TRAZODONE HCL 50 MG PO TABS
150.0000 mg | ORAL_TABLET | Freq: Every day | ORAL | Status: DC
  Administered 2023-11-04 – 2023-11-05 (×2): 150 mg via ORAL
  Filled 2023-11-04 (×2): qty 1

## 2023-11-04 MED ORDER — HYDRALAZINE HCL 20 MG/ML IJ SOLN: 10.0000 mg | INTRAMUSCULAR | Status: DC | PRN

## 2023-11-04 SURGICAL SUPPLY — 12 items
CATH INFINITI 5 FR IM (CATHETERS) IMPLANT
CATH INFINITI 5 FR MPA2 (CATHETERS) IMPLANT
CATH INFINITI 5FR JL4 (CATHETERS) IMPLANT
CATH INFINITI JR4 5F (CATHETERS) IMPLANT
DEVICE RAD TR BAND REGULAR (VASCULAR PRODUCTS) IMPLANT
DRAPE BRACHIAL (DRAPES) IMPLANT
GLIDESHEATH SLEND SS 6F .021 (SHEATH) IMPLANT
GUIDEWIRE INQWIRE 1.5J.035X260 (WIRE) IMPLANT
KIT SYRINGE INJ CVI SPIKEX1 (MISCELLANEOUS) IMPLANT
PACK CARDIAC CATH (CUSTOM PROCEDURE TRAY) ×1 IMPLANT
SET ATX-X65L (MISCELLANEOUS) IMPLANT
STATION PROTECTION PRESSURIZED (MISCELLANEOUS) IMPLANT

## 2023-11-04 NOTE — Assessment & Plan Note (Signed)
-   Continue statin therapy check fasting lipids.

## 2023-11-04 NOTE — H&P (View-Only) (Signed)
 Cardiology Consultation:   Patient ID: Luis Wise; [id number]; [birth year]   Admit date: 11/03/2023 Date of Consult: 11/04/2023  Primary Care Provider: Cleatus Arlyss RAMAN, MD Primary Cardiologist: Perla Primary Electrophysiologist:  None   Patient Profile:   Luis Wise is a 83 y.o. male with a hx of CAD status post 5 vessel CABG in 01/2020 with LIMA to LAD, SVG to diagonal, sequential SVG to OM1 and OM 2, and SVG to PDA, postoperative atrial fibrillation, DM2, HTN, HLD, and carotid artery stenosis who is being seen today for the evaluation of unstable angina at the request of Dr. Lawence.  History of Present Illness:   Mr. Alvidrez was initially evaluated by Dr. Gollan in 12/2019 for chest pain following the ED visit.  He was noted to be markedly hypertensive with systolic blood pressure in the 190s mmHg and having LBBB.  Initial troponin 47 with a delta troponin 43.  He was started on Imdur  and underwent coronary CTA which showed a calcium  score of 5706 with heavily calcified coronary arteries causing significant/severe stenosis of greater than 70% in the mid LAD, mid RCA, and ostial OM1.  FFR was significant in the mid LAD.  Subsequent LHC showed severe heavily calcified multivessel CAD with CTO of the proximal to mid LAD with bridging collaterals and faint right to left collaterals.  He was referred to CVTS and underwent 5 vessel CABG on 01/30/2020.  Preoperative echo showed an EF of 55 to 60% with mild hypokinesis of the apical septal wall, mild concentric LVH, grade 1 diastolic dysfunction, normal RV systolic function and ventricular cavity size, and no significant valvular abnormalities.  Postoperative course was notable for postoperative A-fib treated with amiodarone  and subsequent conversion to sinus rhythm with redevelopment of A-fib briefly thereafter.  He has not been maintained on OAC.  Most recent echo from 11/2021 showed an EF of 45 to 50% with anteroseptal wall hypokinesis, mild LVH,  grade 1 diastolic dysfunction, normal RV systolic function and ventricular cavity size, mild mitral regurgitation, mild aortic insufficiency, aortic valve sclerosis without evidence of stenosis, and an estimated right atrial pressure of 18 mmHg.  He was seen in the office on 11/01/2023 with a several day history of episodic exertional chest discomfort during his typical walk that would improve with rest concerning for accelerating angina.  Pain was rated a 3/10.  He was without symptoms of angina at time of office visit.  He was scheduled for LHC on 11/04/2023.   He called our office on 11/03/2023 reporting increasing angina,  now occurring at rest and waking him from sleep.  He had taken 9 nitroglycerin  since 8/25 with recommendation to proceed to the ED.  He was subsequently admitted on 11/03/2023 with unstable angina.  EKG showed NSR, 67 bpm, known LBBB.  High sensitivity troponin 112 with a delta troponin 118 currently trended to 143.  Chest x-ray without active disease.  BP has ranged from the 120s to 160s mmHg systolic.  In the ER he was given aspirin  324 mg x 1 and placed on nitro and heparin  drips.  Since his arrival to [facility] he has been without symptoms of angina.     Past Medical History:  Diagnosis Date   Allergy    Amoxicillin   Cataract 2017   Mild progression   Coronary artery disease    a. s/p 5-vessel CABG (LIMA-LAD, SVG-diag, sequential SVG-OM1-OM 2, & SVG-PDA)   Diabetes mellitus with complication (HCC)    Hyperlipidemia  Hypertension    Insomnia    neuropathy    bilateral feet   NSTEMI (non-ST elevated myocardial infarction) (HCC) 11/03/2023   Postoperative atrial fibrillation [facility])     Past Surgical History:  Procedure Laterality Date   CARDIAC CATHETERIZATION     01/19/2020   COLONOSCOPY WITH PROPOFOL  N/A 04/01/2021   Procedure: COLONOSCOPY WITH PROPOFOL ;  Surgeon: Jinny Carmine, MD;  Location: [facility] ENDOSCOPY;  Service: Endoscopy;  Laterality: N/A;   CORONARY ARTERY  BYPASS GRAFT N/A 01/29/2020   Procedure: CORONARY ARTERY BYPASS GRAFTING (CABG) TIMES FIVE USING LIMA to LAD; ENDOSCOPIC HARVESTED GREATER SAPHENOUS VEIN: SVG to PDA; SVG to sequenced OM1 & OM2.;  Surgeon: Fleeta Hanford Coy, MD;  Location: [facility] OR;  Service: Open Heart Surgery;  Laterality: N/A;   ENDOVEIN HARVEST OF GREATER SAPHENOUS VEIN Bilateral 01/29/2020   Procedure: ENDOVEIN HARVEST OF GREATER SAPHENOUS VEIN;  Surgeon: Fleeta Hanford Coy, MD;  Location: [facility] OR;  Service: Open Heart Surgery;  Laterality: Bilateral;   EYE SURGERY  Radial K   1997   LAMINECTOMY  1993   L5-S1   LEFT HEART CATH AND CORONARY ANGIOGRAPHY N/A 01/19/2020   Procedure: LEFT HEART CATH AND CORONARY ANGIOGRAPHY;  Surgeon: Darron Deatrice LABOR, MD;  Location: [facility] INVASIVE CV LAB;  Service: Cardiovascular;  Laterality: N/A;   MENISCUS REPAIR  1996   TEE WITHOUT CARDIOVERSION N/A 01/29/2020   Procedure: TRANSESOPHAGEAL ECHOCARDIOGRAM (TEE);  Surgeon: Fleeta Hanford, Coy, MD;  Location: [facility] OR;  Service: Open Heart Surgery;  Laterality: N/A;   TONSILLECTOMY       Home Meds: Prior to Admission medications   Medication Sig Start Date End Date Taking? Authorizing Provider  amLODipine  (NORVASC ) 10 MG tablet Take 1 tablet (10 mg total) by mouth daily. 02/09/23  Yes Cleatus Arlyss RAMAN, MD  aspirin  EC 81 MG tablet Take 81 mg by mouth daily. Swallow whole.   Yes [provider]  atorvastatin  (LIPITOR) 80 MG tablet Take 1 tablet (80 mg total) by mouth daily. 09/07/23  Yes Gollan, Timothy J, MD  carvedilol  (COREG ) 12.5 MG tablet Take 1 tablet (12.5 mg total) by mouth 2 (two) times daily. 09/07/23  Yes Gollan, Timothy J, MD  cholecalciferol  (VITAMIN D3) 25 MCG (1000 UNIT) tablet Take 1,000 Units by mouth every evening.   Yes [provider]  Coenzyme Q10 (COQ10) 100 MG CAPS Take 100 mg by mouth every evening.   Yes [provider]  cyanocobalamin  (VITAMIN B12) 1000 MCG tablet Take 1,000 mcg by mouth daily.   Yes [provider]  dapagliflozin  propanediol (FARXIGA ) 10 MG TABS tablet TAKE 1 TABLET BY MOUTH EVERY DAY BEFORE BREAKFAST 12/16/22  Yes Trixie File, MD  fenofibrate  160 MG tablet Take 1 tablet (160 mg total) by mouth daily. 02/09/23  Yes Cleatus Arlyss RAMAN, MD  GVOKE HYPOPEN  2-PACK 1 MG/0.2ML SOAJ INJECT 0.2 MG UNDER SKIN AS NEEDED FOR LOW BLOOD SUGARS 06/21/23  Yes Trixie File, MD  insulin  lispro (HUMALOG  KWIKPEN) 200 UNIT/ML KwikPen USE AS INSTRUCTED IN THE INSULIN  PUMP MAX DAILY 145 UNITS 03/22/23  Yes Trixie File, MD  MegaRed Omega-3 Krill Oil 350 MG CAPS Take 350 mg by mouth daily.   Yes [provider]  metFORMIN  (GLUCOPHAGE ) 1000 MG tablet TAKE 1 TABLET BY MOUTH TWICE A DAY Patient taking differently: Take 1,500 mg by mouth daily with breakfast. 06/14/23  Yes Cleatus Arlyss RAMAN, MD  Multiple Vitamins-Minerals (PRESERVISION AREDS 2 PO) Take 1 tablet by mouth 2 (two) times daily.  Yes [provider]  nitroGLYCERIN  (NITROSTAT ) 0.4 MG SL tablet Place 1 tablet (0.4 mg total) under the tongue every 5 (five) minutes as needed for chest pain. 11/01/23 01/30/24 Yes Wittenborn, Barnie, NP  traZODone  (DESYREL ) 150 MG tablet Take 1 tablet (150 mg total) by mouth daily. Patient taking differently: Take 150 mg by mouth at bedtime. 02/09/23  Yes Cleatus Arlyss RAMAN, MD  TURMERIC CURCUMIN PO Take 1,500 mg by mouth in the morning and at bedtime.   Yes [provider]  valsartan  (DIOVAN ) 160 MG tablet Take 1 tablet (160 mg total) by mouth daily. 09/07/23  Yes Gollan, Timothy J, MD  benzonatate  (TESSALON ) 200 MG capsule Take 1 capsule (200 mg total) by mouth 3 (three) times daily as needed. Patient not taking: Reported on 11/02/2023 07/27/23   Vincente Shivers, NP  Continuous Blood Gluc Transmit (DEXCOM G6 TRANSMITTER) MISC 1 Device by Does not apply route every 3 (three) months. E11.65 06/18/21   Trixie File, MD  glucose blood (ONETOUCH ULTRA) test strip Use to check blood sugar  daily. Dx E11.9 08/06/22   Cleatus Arlyss RAMAN, MD  Insulin  Disposable Pump (OMNIPOD 5 G6 PODS, GEN 5,) MISC USE AS INSTRUCTED TO ADMINISTER INSULIN  07/30/22   Trixie File, MD  Insulin  Pen Needle 32G X 4 MM MISC Use 4x a day 05/02/21   Trixie File, MD  sacubitril-valsartan  (ENTRESTO ) 49-51 MG Take 1 tablet by mouth 2 (two) times daily.    [provider]  sildenafil  (VIAGRA ) 100 MG tablet Take 1 tablet (100 mg total) by mouth as needed for erectile dysfunction. 02/09/23   Cleatus Arlyss RAMAN, MD    Inpatient Medications: Scheduled Meds:  amLODipine   10 mg Oral Daily   aspirin  EC  81 mg Oral Daily   atorvastatin   80 mg Oral Daily   carvedilol   12.5 mg Oral BID   cholecalciferol   1,000 Units Oral QPM   cyanocobalamin   1,000 mcg Oral Daily   dapagliflozin  propanediol  10 mg Oral Daily   fenofibrate   160 mg Oral Daily   insulin  aspart  0-5 Units Subcutaneous QHS   insulin  aspart  0-9 Units Subcutaneous TID WC   irbesartan   150 mg Oral Daily   multivitamin  1 tablet Oral BID   traZODone   150 mg Oral QHS   Continuous Infusions:  sodium chloride  100 mL/hr at 11/04/23 0604   heparin  1,200 Units/hr (11/04/23 0655)   nitroGLYCERIN  5 mcg/min (11/04/23 0735)   PRN Meds: acetaminophen , ALPRAZolam , magnesium  hydroxide, nitroGLYCERIN , ondansetron  (ZOFRAN ) IV, traZODone   Allergies:   Allergies  Allergen Reactions   Ozempic  (0.25 Or 0.5 Mg-Dose) [Semaglutide (0.25 Or 0.5mg -Dos)]     Presumed cause of GI upset.     Amoxil [Amoxicillin] Rash    Social History:   Social History   Socioeconomic History   Marital status: Married    Spouse name: Not on file   Number of children: Not on file   Years of education: Not on file   Highest education level: Master's degree (e.g., MA, MS, MEng, MEd, MSW, MBA)  Occupational History   Occupation: CFO  Tobacco Use   Smoking status: Never   Smokeless tobacco: Never  Vaping Use   Vaping status: Never Used  Substance and Sexual Activity    Alcohol use: Not Currently    Alcohol/week: 0.0 standard drinks of alcohol    Comment: Very rare- occasionaly beer or wine    Drug use: No   Sexual activity: Yes    Birth control/protection:  None  Other Topics Concern   Not on file  Social History Narrative   Married 1970.   [employer]  [employer], masters at [employer] .   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   Social Drivers of Corporate investment banker Strain: Low Risk  (02/05/2023)   Overall Financial Resource Strain (CARDIA)    Difficulty of Paying Living Expenses: Not hard at all  Food Insecurity: No Food Insecurity (02/05/2023)   Hunger Vital Sign    Worried About Running Out of Food in the Last Year: Never true    Ran Out of Food in the Last Year: Never true  Transportation Needs: No Transportation Needs (02/05/2023)   PRAPARE - Administrator, Civil Service (Medical): No    Lack of Transportation (Non-Medical): No  Physical Activity: Sufficiently Active (02/05/2023)   Exercise Vital Sign    Days of Exercise per Week: 3 days    Minutes of Exercise per Session: 50 min  Stress: No Stress Concern Present (02/05/2023)   [employer] of Occupational Health - Occupational Stress Questionnaire    Feeling of Stress : Not at all  Social Connections: Socially Integrated (02/05/2023)   Social Connection and Isolation Panel    Frequency of Communication with Friends and Family: More than three times a week    Frequency of Social Gatherings with Friends and Family: Once a week    Attends Religious Services: More than 4 times per year    Active Member of [employer] or Organizations: Yes    Attends Engineer, structural: More than 4 times per year    Marital Status: Married  Catering manager Violence: Not At Risk (12/22/2021)   Humiliation, Afraid, Rape, and Kick questionnaire    Fear of Current or  Ex-Partner: No    Emotionally Abused: No    Physically Abused: No    Sexually Abused: No     Family History:   Family History  Problem Relation Age of Onset   Hypertension Mother    Diabetes Father    Cancer Neg Hx    COPD Neg Hx    Heart disease Neg Hx    Hyperlipidemia Neg Hx    Stroke Neg Hx    Colon cancer Neg Hx    Prostate cancer Neg Hx     ROS:  Review of Systems  Constitutional:  Negative for chills, diaphoresis, fever, malaise/fatigue and weight loss.  HENT:  Negative for congestion.   Eyes:  Negative for discharge and redness.  Respiratory:  Negative for cough, sputum production, shortness of breath and wheezing.   Cardiovascular:  Positive for chest pain. Negative for palpitations, orthopnea, claudication, leg swelling and PND.  Gastrointestinal:  Negative for abdominal pain, heartburn, nausea and vomiting.  Musculoskeletal:  Negative for falls and myalgias.  Skin:  Negative for rash.  Neurological:  Negative for dizziness, tingling, tremors, sensory change, speech change, focal weakness, loss of consciousness and weakness.  Endo/Heme/Allergies:  Does not bruise/bleed easily.  Psychiatric/Behavioral:  Negative for substance abuse. The patient is not nervous/anxious.   All other systems reviewed and are negative.     Physical Exam/Data:   Vitals:   11/04/23 0645 11/04/23 0700 11/04/23 0714 11/04/23 0730  BP:  (!) 164/109  (!) 162/81  Pulse: (!) 53 62 62 87  Resp:    16  Temp:    98.3 F (36.8 C)  TempSrc:  Oral  SpO2: 94% 97% 95% 96%  Weight:      Height:       No intake or output data in the 24 hours ending 11/04/23 0838 Filed Weights   11/03/23 2026  Weight: 81.8 kg   Body mass index is 26.63 kg/m.   Physical Exam: General: Well developed, well nourished, in no acute distress. Head: Normocephalic, atraumatic, sclera non-icteric, no xanthomas, nares without discharge.  Neck: Negative for carotid bruits. JVD not elevated. Lungs: Clear  bilaterally to auscultation without wheezes, rales, or rhonchi. Breathing is unlabored. Heart: RRR with S1 S2. No murmurs, rubs, or gallops appreciated. Abdomen: Soft, non-tender, non-distended with normoactive bowel sounds. No hepatomegaly. No rebound/guarding. No obvious abdominal masses. Msk:  Strength and tone appear normal for age. Extremities: No clubbing or cyanosis. No edema. Distal pedal pulses are 2+ and equal bilaterally. Neuro: Alert and oriented X 3. No facial asymmetry. No focal deficit. Moves all extremities spontaneously. Psych:  Responds to questions appropriately with a normal affect.   EKG:  The EKG was personally reviewed and demonstrates: NSR, 67 bpm, known LBBB Telemetry:  Telemetry was personally reviewed and demonstrates: SR with sinus bradycardia with 1st degree AV block  Weights: Filed Weights   11/03/23 2026  Weight: 81.8 kg    Relevant CV Studies:  Coronary CTA 01/11/2020: Aorta: Normal size. Minimal descending aortic wall calcifications. No dissection.   Aortic Valve:  Trileaflet.  No calcifications.   Coronary Arteries:  Normal coronary origin.  Right dominance.   RCA is a large dominant artery that gives rise to PDA and PLA. Heavily calcified plaque throughout the vessel causing significant stenosis in the mid vessel (>70%)   Left main is a large artery that gives rise to LAD and LCX arteries.   LAD is a large vessel that has calcified plaque causing mild stenosis in the proximal segment (25-49%) and severe stenosis in the mid segment (>70%).   LCX is a non-dominant artery that gives rise two obtuse marginal branches. There is calcified in the proximal LCx segment causing mild stenosis, calcified plaque in the ostial first obtuse marginal causing severe stenosis.   Other findings:   Normal pulmonary vein drainage into the left atrium.   Normal left atrial appendage without a thrombus.   Normal size of the pulmonary artery.    IMPRESSION: 1. Coronary calcium  score of 5706. This was 98th percentile for age and sex matched control. 2. Normal coronary origin with right dominance. 3. Heavily calcified coronary arteries causing significant/severe stenosis (>70%) in the mid LAD, mid RCA, ostial OM1 4. CAD-RADS 4 Severe stenosis. (70-99% or > 50% left main). Cardiac catheterization or CT FFR is recommended. Consider symptom-guided anti-ischemic pharmacotherapy as well as risk factor modification per guideline directed care. Additional analysis with CT FFR will be submitted and reported separately.  ctFFR: 1. Left Main:  No significant stenosis. 2. LAD: Significant stenosis in the mid segment.  FFRct 0.54 3. LCX: No significant stenosis. 4. RCA: No significant stenosis.   IMPRESSION: 1.  CT FFR analysis showed significant stenosis in the mid LAD. __________  [facility] 01/19/2020: Prox RCA lesion is 60% stenosed. Dist RCA lesion is 50% stenosed. RPDA lesion is 50% stenosed. Mid RCA lesion is 70% stenosed. 2nd Mrg lesion is 80% stenosed. Dist Cx lesion is 80% stenosed. Prox LAD to Mid LAD lesion is 100% stenosed. 1st Diag lesion is 80% stenosed. Prox LAD lesion is 70% stenosed. The left ventricular systolic function is normal. LV end diastolic  pressure is mildly elevated. The left ventricular ejection fraction is 55-65% by visual estimate.   1.  Severe heavily calcified three-vessel coronary artery disease.  Chronic occlusion of the proximal/mid LAD with bridging collaterals as well as faint right to left collaterals. 2.  Normal LV systolic function and mildly elevated left ventricular end-diastolic pressure at 13 mmHg.   Recommendations: Given diabetic status, heavily calcified 3 vessels disease and chronic occlusion of the LAD, I think the best option is CABG.  The anterior wall motion appears to be normal and thus the LAD territory seems to be viable. I instructed the patient to discontinue clopidogrel  and  continue aspirin . Consult CVTS for CABG. __________  Echo 01/26/2020: 1. Left ventricular ejection fraction, by estimation, is 55 to 60%. The  left ventricle has normal function. The left ventricle demonstrates  regional wall motion abnormalities (see scoring diagram/findings for  description). There is mild concentric left  ventricular hypertrophy. Left ventricular diastolic parameters are  consistent with Grade I diastolic dysfunction (impaired relaxation). There  is mild hypokinesis of the left ventricular, apical septal wall.   2. Right ventricular systolic function is normal. The right ventricular  size is normal.   3. The mitral valve is normal in structure. Trivial mitral valve  regurgitation. No evidence of mitral stenosis.   4. The aortic valve is normal in structure. Aortic valve regurgitation is  not visualized. No aortic stenosis is present.   5. The inferior vena cava is normal in size with greater than 50%  respiratory variability, suggesting right atrial pressure of 3 mmHg.  __________  Echo 11/27/2021: 1. Left ventricular ejection fraction, by estimation, is 45 to 50%. The  left ventricle has mildly decreased function. The left ventricle  demonstrates regional wall motion abnormalities (anteroseptal wall  hypokinesis, possibly secondary to post-operative   state). There is mild left ventricular hypertrophy. Left ventricular  diastolic parameters are consistent with Grade I diastolic dysfunction  (impaired relaxation).   2. Right ventricular systolic function is normal. The right ventricular  size is normal.   3. The mitral valve is normal in structure. Mild mitral valve  regurgitation. No evidence of mitral stenosis.   4. The aortic valve is normal in structure. There is mild calcification  of the aortic valve. Aortic valve regurgitation is mild. Aortic valve  sclerosis is present, with no evidence of aortic valve stenosis.   5. The inferior vena cava is dilated in  size with >50% respiratory  variability, suggesting right atrial pressure of 8 mmHg.   Comparison(s): 01/26/20-EF 60-65%.  __________  Carotid artery ultrasound 04/08/2023: Summary:  Right Carotid: Velocities in the right ICA are consistent with a 1-39%  stenosis.   Left Carotid: Velocities in the left ICA are consistent with a 1-39%  stenosis.   Vertebrals: Bilateral vertebral arteries demonstrate antegrade flow.  Subclavians: Normal flow hemodynamics were seen in bilateral subclavian arteries.   Laboratory Data:  Chemistry Recent Labs  Lab 11/01/23 1147 11/03/23 2044 11/04/23 0556  NA 140 137 142  K 4.2 4.1 3.7  CL 102 105 107  CO2 21 24 23   GLUCOSE 78 135* 102*  BUN 16 23 19   CREATININE 1.19 1.18 0.94  CALCIUM  9.9 9.9 9.4  GFRNONAA  --  >60 >60  ANIONGAP  --  8 12    No results for input(s): PROT, ALBUMIN , AST, ALT, ALKPHOS, BILITOT in the last 168 hours. Hematology Recent Labs  Lab 11/01/23 1147 11/03/23 2044 11/04/23 0556  WBC 3.4 4.7 4.9  RBC 5.03 4.73 4.66  HGB 14.5 13.7 13.8  HCT 43.3 40.2 39.7  MCV 86 85.0 85.2  MCH 28.8 29.0 29.6  MCHC 33.5 34.1 34.8  RDW 13.6 14.4 14.6  PLT 231 228 216   Cardiac EnzymesNo results for input(s): TROPONINI in the last 168 hours. No results for input(s): TROPIPOC in the last 168 hours.  BNPNo results for input(s): BNP, PROBNP in the last 168 hours.  DDimer No results for input(s): DDIMER in the last 168 hours.  Radiology/Studies:  DG Chest 1 View Result Date: 11/03/2023 IMPRESSION: No active disease. Electronically Signed   By: Elsie Gravely M.D.   On: 11/03/2023 20:37    Assessment and Plan:   1. CAD s/p CABG with unstable angina:  - Currently, without symptoms of angina or cardiac decompensation  - Troponin has trended to 143 - Continue aspirin  81 mg, atorvastatin  80 mg, carvedilol  12.5 mg twice daily, amlodipine  10 mg  - Heparin  drip  - Remains on nitroglycerin  drip, wean - N.p.o.  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 HTN:  - Blood pressure remains elevated - Wean nitroglycerin  drip - Has not yet received amlodipine  or irbesartan  in the ED - PTA amlodipine  10 mg, carvedilol  12.5 mg twice daily, and irbesartan  150 mg  - Escalate antihypertensive therapy as indicated following administration of amlodipine  and irbesartan     4. HLD:  - LDL 43  - LP(a) pending  - PTA atorvastatin  80 mg   5. Carotid artery stenosis:  - 1 to 39% bilateral ICA stenosis in 03/2023  - Aspirin  and atorvastatin  as outlined above    Informed Consent   Shared Decision Making/Informed Consent{  The risks [stroke (1 in 1000), death (1 in 1000), kidney failure [usually temporary] (1 in 500), bleeding (1 in 200), allergic reaction [possibly serious] (1 in 200)], benefits (diagnostic support and management of coronary artery disease) and alternatives of a cardiac catheterization were discussed in detail with Mr. Brogden and he is willing to proceed.       For questions or updates, please contact [facility] Please consult [email or web address] for contact info under Cardiology/STEMI.   Signed, Bernardino Bring, PA-C [facility] Pager: [phone] 11/04/2023, 8:38 AM

## 2023-11-04 NOTE — Assessment & Plan Note (Addendum)
-   The patient will be admitted to a progressive unit bed. - Will follow serial troponins and EKGs. - The patient will be placed on aspirin  as well as p.r.n. sublingual nitroglycerin  and morphine  sulfate for pain. - He will be placed on high-dose statin therapy and continued on beta-blocker therapy with Coreg . - We will obtain a cardiology consult in a.m. for further cardiac risk stratification. - I notified [facility] group about the patient. - Continue IV heparin  and obtain a 2D echo.

## 2023-11-04 NOTE — Telephone Encounter (Signed)
 Wife called.  Patient has just gotten out of the Cath lab.  Blood glucose good.  [place] did not want patient to use the pump during the procedure and therefore it was removed.  Pump alarms are bothering patient.  Shared with wife how to turn off his pump. Leita Constable, RD, LDN, CDCES, DipACLM

## 2023-11-04 NOTE — Assessment & Plan Note (Signed)
-   The patient will be admitted to a progressive unit bed. - Will follow serial troponins and EKGs. - The patient will be placed on aspirin  as well as p.r.n. sublingual nitroglycerin  and morphine  sulfate for pain. - He will be placed on high-dose statin therapy and continued on beta-blocker therapy with Coreg . - We will obtain a cardiology consult in a.m. for further cardiac risk stratification. - I notified [facility] group about the patient. - Continue IV heparin  and obtain a 2D echo.

## 2023-11-04 NOTE — Consult Note (Signed)
 Cardiology Consultation:   Patient ID: Luis Wise; [id number]; [birth year]   Admit date: 11/03/2023 Date of Consult: 11/04/2023  Primary Care Provider: Cleatus Arlyss RAMAN, MD Primary Cardiologist: Perla Primary Electrophysiologist:  None   Patient Profile:   Luis Wise is a 83 y.o. male with a hx of CAD status post 5 vessel CABG in 01/2020 with LIMA to LAD, SVG to diagonal, sequential SVG to OM1 and OM 2, and SVG to PDA, postoperative atrial fibrillation, DM2, HTN, HLD, and carotid artery stenosis who is being seen today for the evaluation of unstable angina at the request of Dr. Lawence.  History of Present Illness:   Mr. Alvidrez was initially evaluated by Dr. Gollan in 12/2019 for chest pain following the ED visit.  He was noted to be markedly hypertensive with systolic blood pressure in the 190s mmHg and having LBBB.  Initial troponin 47 with a delta troponin 43.  He was started on Imdur  and underwent coronary CTA which showed a calcium  score of 5706 with heavily calcified coronary arteries causing significant/severe stenosis of greater than 70% in the mid LAD, mid RCA, and ostial OM1.  FFR was significant in the mid LAD.  Subsequent LHC showed severe heavily calcified multivessel CAD with CTO of the proximal to mid LAD with bridging collaterals and faint right to left collaterals.  He was referred to CVTS and underwent 5 vessel CABG on 01/30/2020.  Preoperative echo showed an EF of 55 to 60% with mild hypokinesis of the apical septal wall, mild concentric LVH, grade 1 diastolic dysfunction, normal RV systolic function and ventricular cavity size, and no significant valvular abnormalities.  Postoperative course was notable for postoperative A-fib treated with amiodarone  and subsequent conversion to sinus rhythm with redevelopment of A-fib briefly thereafter.  He has not been maintained on OAC.  Most recent echo from 11/2021 showed an EF of 45 to 50% with anteroseptal wall hypokinesis, mild LVH,  grade 1 diastolic dysfunction, normal RV systolic function and ventricular cavity size, mild mitral regurgitation, mild aortic insufficiency, aortic valve sclerosis without evidence of stenosis, and an estimated right atrial pressure of 18 mmHg.  He was seen in the office on 11/01/2023 with a several day history of episodic exertional chest discomfort during his typical walk that would improve with rest concerning for accelerating angina.  Pain was rated a 3/10.  He was without symptoms of angina at time of office visit.  He was scheduled for LHC on 11/04/2023.   He called our office on 11/03/2023 reporting increasing angina,  now occurring at rest and waking him from sleep.  He had taken 9 nitroglycerin  since 8/25 with recommendation to proceed to the ED.  He was subsequently admitted on 11/03/2023 with unstable angina.  EKG showed NSR, 67 bpm, known LBBB.  High sensitivity troponin 112 with a delta troponin 118 currently trended to 143.  Chest x-ray without active disease.  BP has ranged from the 120s to 160s mmHg systolic.  In the ER he was given aspirin  324 mg x 1 and placed on nitro and heparin  drips.  Since his arrival to [facility] he has been without symptoms of angina.     Past Medical History:  Diagnosis Date   Allergy    Amoxicillin   Cataract 2017   Mild progression   Coronary artery disease    a. s/p 5-vessel CABG (LIMA-LAD, SVG-diag, sequential SVG-OM1-OM 2, & SVG-PDA)   Diabetes mellitus with complication (HCC)    Hyperlipidemia  Hypertension    Insomnia    neuropathy    bilateral feet   NSTEMI (non-ST elevated myocardial infarction) (HCC) 11/03/2023   Postoperative atrial fibrillation [facility])     Past Surgical History:  Procedure Laterality Date   CARDIAC CATHETERIZATION     01/19/2020   COLONOSCOPY WITH PROPOFOL  N/A 04/01/2021   Procedure: COLONOSCOPY WITH PROPOFOL ;  Surgeon: Jinny Carmine, MD;  Location: [facility] ENDOSCOPY;  Service: Endoscopy;  Laterality: N/A;   CORONARY ARTERY  BYPASS GRAFT N/A 01/29/2020   Procedure: CORONARY ARTERY BYPASS GRAFTING (CABG) TIMES FIVE USING LIMA to LAD; ENDOSCOPIC HARVESTED GREATER SAPHENOUS VEIN: SVG to PDA; SVG to sequenced OM1 & OM2.;  Surgeon: Fleeta Hanford Coy, MD;  Location: [facility] OR;  Service: Open Heart Surgery;  Laterality: N/A;   ENDOVEIN HARVEST OF GREATER SAPHENOUS VEIN Bilateral 01/29/2020   Procedure: ENDOVEIN HARVEST OF GREATER SAPHENOUS VEIN;  Surgeon: Fleeta Hanford Coy, MD;  Location: [facility] OR;  Service: Open Heart Surgery;  Laterality: Bilateral;   EYE SURGERY  Radial K   1997   LAMINECTOMY  1993   L5-S1   LEFT HEART CATH AND CORONARY ANGIOGRAPHY N/A 01/19/2020   Procedure: LEFT HEART CATH AND CORONARY ANGIOGRAPHY;  Surgeon: Darron Deatrice LABOR, MD;  Location: [facility] INVASIVE CV LAB;  Service: Cardiovascular;  Laterality: N/A;   MENISCUS REPAIR  1996   TEE WITHOUT CARDIOVERSION N/A 01/29/2020   Procedure: TRANSESOPHAGEAL ECHOCARDIOGRAM (TEE);  Surgeon: Fleeta Hanford, Coy, MD;  Location: [facility] OR;  Service: Open Heart Surgery;  Laterality: N/A;   TONSILLECTOMY       Home Meds: Prior to Admission medications   Medication Sig Start Date End Date Taking? Authorizing Provider  amLODipine  (NORVASC ) 10 MG tablet Take 1 tablet (10 mg total) by mouth daily. 02/09/23  Yes Cleatus Arlyss RAMAN, MD  aspirin  EC 81 MG tablet Take 81 mg by mouth daily. Swallow whole.   Yes [provider]  atorvastatin  (LIPITOR) 80 MG tablet Take 1 tablet (80 mg total) by mouth daily. 09/07/23  Yes Gollan, Timothy J, MD  carvedilol  (COREG ) 12.5 MG tablet Take 1 tablet (12.5 mg total) by mouth 2 (two) times daily. 09/07/23  Yes Gollan, Timothy J, MD  cholecalciferol  (VITAMIN D3) 25 MCG (1000 UNIT) tablet Take 1,000 Units by mouth every evening.   Yes [provider]  Coenzyme Q10 (COQ10) 100 MG CAPS Take 100 mg by mouth every evening.   Yes [provider]  cyanocobalamin  (VITAMIN B12) 1000 MCG tablet Take 1,000 mcg by mouth daily.   Yes [provider]  dapagliflozin  propanediol (FARXIGA ) 10 MG TABS tablet TAKE 1 TABLET BY MOUTH EVERY DAY BEFORE BREAKFAST 12/16/22  Yes Trixie File, MD  fenofibrate  160 MG tablet Take 1 tablet (160 mg total) by mouth daily. 02/09/23  Yes Cleatus Arlyss RAMAN, MD  GVOKE HYPOPEN  2-PACK 1 MG/0.2ML SOAJ INJECT 0.2 MG UNDER SKIN AS NEEDED FOR LOW BLOOD SUGARS 06/21/23  Yes Trixie File, MD  insulin  lispro (HUMALOG  KWIKPEN) 200 UNIT/ML KwikPen USE AS INSTRUCTED IN THE INSULIN  PUMP MAX DAILY 145 UNITS 03/22/23  Yes Trixie File, MD  MegaRed Omega-3 Krill Oil 350 MG CAPS Take 350 mg by mouth daily.   Yes [provider]  metFORMIN  (GLUCOPHAGE ) 1000 MG tablet TAKE 1 TABLET BY MOUTH TWICE A DAY Patient taking differently: Take 1,500 mg by mouth daily with breakfast. 06/14/23  Yes Cleatus Arlyss RAMAN, MD  Multiple Vitamins-Minerals (PRESERVISION AREDS 2 PO) Take 1 tablet by mouth 2 (two) times daily.  Yes [provider]  nitroGLYCERIN  (NITROSTAT ) 0.4 MG SL tablet Place 1 tablet (0.4 mg total) under the tongue every 5 (five) minutes as needed for chest pain. 11/01/23 01/30/24 Yes Wittenborn, Barnie, NP  traZODone  (DESYREL ) 150 MG tablet Take 1 tablet (150 mg total) by mouth daily. Patient taking differently: Take 150 mg by mouth at bedtime. 02/09/23  Yes Cleatus Arlyss RAMAN, MD  TURMERIC CURCUMIN PO Take 1,500 mg by mouth in the morning and at bedtime.   Yes [provider]  valsartan  (DIOVAN ) 160 MG tablet Take 1 tablet (160 mg total) by mouth daily. 09/07/23  Yes Gollan, Timothy J, MD  benzonatate  (TESSALON ) 200 MG capsule Take 1 capsule (200 mg total) by mouth 3 (three) times daily as needed. Patient not taking: Reported on 11/02/2023 07/27/23   Vincente Shivers, NP  Continuous Blood Gluc Transmit (DEXCOM G6 TRANSMITTER) MISC 1 Device by Does not apply route every 3 (three) months. E11.65 06/18/21   Trixie File, MD  glucose blood (ONETOUCH ULTRA) test strip Use to check blood sugar  daily. Dx E11.9 08/06/22   Cleatus Arlyss RAMAN, MD  Insulin  Disposable Pump (OMNIPOD 5 G6 PODS, GEN 5,) MISC USE AS INSTRUCTED TO ADMINISTER INSULIN  07/30/22   Trixie File, MD  Insulin  Pen Needle 32G X 4 MM MISC Use 4x a day 05/02/21   Trixie File, MD  sacubitril-valsartan  (ENTRESTO ) 49-51 MG Take 1 tablet by mouth 2 (two) times daily.    [provider]  sildenafil  (VIAGRA ) 100 MG tablet Take 1 tablet (100 mg total) by mouth as needed for erectile dysfunction. 02/09/23   Cleatus Arlyss RAMAN, MD    Inpatient Medications: Scheduled Meds:  amLODipine   10 mg Oral Daily   aspirin  EC  81 mg Oral Daily   atorvastatin   80 mg Oral Daily   carvedilol   12.5 mg Oral BID   cholecalciferol   1,000 Units Oral QPM   cyanocobalamin   1,000 mcg Oral Daily   dapagliflozin  propanediol  10 mg Oral Daily   fenofibrate   160 mg Oral Daily   insulin  aspart  0-5 Units Subcutaneous QHS   insulin  aspart  0-9 Units Subcutaneous TID WC   irbesartan   150 mg Oral Daily   multivitamin  1 tablet Oral BID   traZODone   150 mg Oral QHS   Continuous Infusions:  sodium chloride  100 mL/hr at 11/04/23 0604   heparin  1,200 Units/hr (11/04/23 0655)   nitroGLYCERIN  5 mcg/min (11/04/23 0735)   PRN Meds: acetaminophen , ALPRAZolam , magnesium  hydroxide, nitroGLYCERIN , ondansetron  (ZOFRAN ) IV, traZODone   Allergies:   Allergies  Allergen Reactions   Ozempic  (0.25 Or 0.5 Mg-Dose) [Semaglutide (0.25 Or 0.5mg -Dos)]     Presumed cause of GI upset.     Amoxil [Amoxicillin] Rash    Social History:   Social History   Socioeconomic History   Marital status: Married    Spouse name: Not on file   Number of children: Not on file   Years of education: Not on file   Highest education level: Master's degree (e.g., MA, MS, MEng, MEd, MSW, MBA)  Occupational History   Occupation: CFO  Tobacco Use   Smoking status: Never   Smokeless tobacco: Never  Vaping Use   Vaping status: Never Used  Substance and Sexual Activity    Alcohol use: Not Currently    Alcohol/week: 0.0 standard drinks of alcohol    Comment: Very rare- occasionaly beer or wine    Drug use: No   Sexual activity: Yes    Birth control/protection:  None  Other Topics Concern   Not on file  Social History Narrative   Married 1970.   [employer]  [employer], masters at [employer] .   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   Social Drivers of Corporate investment banker Strain: Low Risk  (02/05/2023)   Overall Financial Resource Strain (CARDIA)    Difficulty of Paying Living Expenses: Not hard at all  Food Insecurity: No Food Insecurity (02/05/2023)   Hunger Vital Sign    Worried About Running Out of Food in the Last Year: Never true    Ran Out of Food in the Last Year: Never true  Transportation Needs: No Transportation Needs (02/05/2023)   PRAPARE - Administrator, Civil Service (Medical): No    Lack of Transportation (Non-Medical): No  Physical Activity: Sufficiently Active (02/05/2023)   Exercise Vital Sign    Days of Exercise per Week: 3 days    Minutes of Exercise per Session: 50 min  Stress: No Stress Concern Present (02/05/2023)   [employer] of Occupational Health - Occupational Stress Questionnaire    Feeling of Stress : Not at all  Social Connections: Socially Integrated (02/05/2023)   Social Connection and Isolation Panel    Frequency of Communication with Friends and Family: More than three times a week    Frequency of Social Gatherings with Friends and Family: Once a week    Attends Religious Services: More than 4 times per year    Active Member of [employer] or Organizations: Yes    Attends Engineer, structural: More than 4 times per year    Marital Status: Married  Catering manager Violence: Not At Risk (12/22/2021)   Humiliation, Afraid, Rape, and Kick questionnaire    Fear of Current or  Ex-Partner: No    Emotionally Abused: No    Physically Abused: No    Sexually Abused: No     Family History:   Family History  Problem Relation Age of Onset   Hypertension Mother    Diabetes Father    Cancer Neg Hx    COPD Neg Hx    Heart disease Neg Hx    Hyperlipidemia Neg Hx    Stroke Neg Hx    Colon cancer Neg Hx    Prostate cancer Neg Hx     ROS:  Review of Systems  Constitutional:  Negative for chills, diaphoresis, fever, malaise/fatigue and weight loss.  HENT:  Negative for congestion.   Eyes:  Negative for discharge and redness.  Respiratory:  Negative for cough, sputum production, shortness of breath and wheezing.   Cardiovascular:  Positive for chest pain. Negative for palpitations, orthopnea, claudication, leg swelling and PND.  Gastrointestinal:  Negative for abdominal pain, heartburn, nausea and vomiting.  Musculoskeletal:  Negative for falls and myalgias.  Skin:  Negative for rash.  Neurological:  Negative for dizziness, tingling, tremors, sensory change, speech change, focal weakness, loss of consciousness and weakness.  Endo/Heme/Allergies:  Does not bruise/bleed easily.  Psychiatric/Behavioral:  Negative for substance abuse. The patient is not nervous/anxious.   All other systems reviewed and are negative.     Physical Exam/Data:   Vitals:   11/04/23 0645 11/04/23 0700 11/04/23 0714 11/04/23 0730  BP:  (!) 164/109  (!) 162/81  Pulse: (!) 53 62 62 87  Resp:    16  Temp:    98.3 F (36.8 C)  TempSrc:  Oral  SpO2: 94% 97% 95% 96%  Weight:      Height:       No intake or output data in the 24 hours ending 11/04/23 0838 Filed Weights   11/03/23 2026  Weight: 81.8 kg   Body mass index is 26.63 kg/m.   Physical Exam: General: Well developed, well nourished, in no acute distress. Head: Normocephalic, atraumatic, sclera non-icteric, no xanthomas, nares without discharge.  Neck: Negative for carotid bruits. JVD not elevated. Lungs: Clear  bilaterally to auscultation without wheezes, rales, or rhonchi. Breathing is unlabored. Heart: RRR with S1 S2. No murmurs, rubs, or gallops appreciated. Abdomen: Soft, non-tender, non-distended with normoactive bowel sounds. No hepatomegaly. No rebound/guarding. No obvious abdominal masses. Msk:  Strength and tone appear normal for age. Extremities: No clubbing or cyanosis. No edema. Distal pedal pulses are 2+ and equal bilaterally. Neuro: Alert and oriented X 3. No facial asymmetry. No focal deficit. Moves all extremities spontaneously. Psych:  Responds to questions appropriately with a normal affect.   EKG:  The EKG was personally reviewed and demonstrates: NSR, 67 bpm, known LBBB Telemetry:  Telemetry was personally reviewed and demonstrates: SR with sinus bradycardia with 1st degree AV block  Weights: Filed Weights   11/03/23 2026  Weight: 81.8 kg    Relevant CV Studies:  Coronary CTA 01/11/2020: Aorta: Normal size. Minimal descending aortic wall calcifications. No dissection.   Aortic Valve:  Trileaflet.  No calcifications.   Coronary Arteries:  Normal coronary origin.  Right dominance.   RCA is a large dominant artery that gives rise to PDA and PLA. Heavily calcified plaque throughout the vessel causing significant stenosis in the mid vessel (>70%)   Left main is a large artery that gives rise to LAD and LCX arteries.   LAD is a large vessel that has calcified plaque causing mild stenosis in the proximal segment (25-49%) and severe stenosis in the mid segment (>70%).   LCX is a non-dominant artery that gives rise two obtuse marginal branches. There is calcified in the proximal LCx segment causing mild stenosis, calcified plaque in the ostial first obtuse marginal causing severe stenosis.   Other findings:   Normal pulmonary vein drainage into the left atrium.   Normal left atrial appendage without a thrombus.   Normal size of the pulmonary artery.    IMPRESSION: 1. Coronary calcium  score of 5706. This was 98th percentile for age and sex matched control. 2. Normal coronary origin with right dominance. 3. Heavily calcified coronary arteries causing significant/severe stenosis (>70%) in the mid LAD, mid RCA, ostial OM1 4. CAD-RADS 4 Severe stenosis. (70-99% or > 50% left main). Cardiac catheterization or CT FFR is recommended. Consider symptom-guided anti-ischemic pharmacotherapy as well as risk factor modification per guideline directed care. Additional analysis with CT FFR will be submitted and reported separately.  ctFFR: 1. Left Main:  No significant stenosis. 2. LAD: Significant stenosis in the mid segment.  FFRct 0.54 3. LCX: No significant stenosis. 4. RCA: No significant stenosis.   IMPRESSION: 1.  CT FFR analysis showed significant stenosis in the mid LAD. __________  [facility] 01/19/2020: Prox RCA lesion is 60% stenosed. Dist RCA lesion is 50% stenosed. RPDA lesion is 50% stenosed. Mid RCA lesion is 70% stenosed. 2nd Mrg lesion is 80% stenosed. Dist Cx lesion is 80% stenosed. Prox LAD to Mid LAD lesion is 100% stenosed. 1st Diag lesion is 80% stenosed. Prox LAD lesion is 70% stenosed. The left ventricular systolic function is normal. LV end diastolic  pressure is mildly elevated. The left ventricular ejection fraction is 55-65% by visual estimate.   1.  Severe heavily calcified three-vessel coronary artery disease.  Chronic occlusion of the proximal/mid LAD with bridging collaterals as well as faint right to left collaterals. 2.  Normal LV systolic function and mildly elevated left ventricular end-diastolic pressure at 13 mmHg.   Recommendations: Given diabetic status, heavily calcified 3 vessels disease and chronic occlusion of the LAD, I think the best option is CABG.  The anterior wall motion appears to be normal and thus the LAD territory seems to be viable. I instructed the patient to discontinue clopidogrel  and  continue aspirin . Consult CVTS for CABG. __________  Echo 01/26/2020: 1. Left ventricular ejection fraction, by estimation, is 55 to 60%. The  left ventricle has normal function. The left ventricle demonstrates  regional wall motion abnormalities (see scoring diagram/findings for  description). There is mild concentric left  ventricular hypertrophy. Left ventricular diastolic parameters are  consistent with Grade I diastolic dysfunction (impaired relaxation). There  is mild hypokinesis of the left ventricular, apical septal wall.   2. Right ventricular systolic function is normal. The right ventricular  size is normal.   3. The mitral valve is normal in structure. Trivial mitral valve  regurgitation. No evidence of mitral stenosis.   4. The aortic valve is normal in structure. Aortic valve regurgitation is  not visualized. No aortic stenosis is present.   5. The inferior vena cava is normal in size with greater than 50%  respiratory variability, suggesting right atrial pressure of 3 mmHg.  __________  Echo 11/27/2021: 1. Left ventricular ejection fraction, by estimation, is 45 to 50%. The  left ventricle has mildly decreased function. The left ventricle  demonstrates regional wall motion abnormalities (anteroseptal wall  hypokinesis, possibly secondary to post-operative   state). There is mild left ventricular hypertrophy. Left ventricular  diastolic parameters are consistent with Grade I diastolic dysfunction  (impaired relaxation).   2. Right ventricular systolic function is normal. The right ventricular  size is normal.   3. The mitral valve is normal in structure. Mild mitral valve  regurgitation. No evidence of mitral stenosis.   4. The aortic valve is normal in structure. There is mild calcification  of the aortic valve. Aortic valve regurgitation is mild. Aortic valve  sclerosis is present, with no evidence of aortic valve stenosis.   5. The inferior vena cava is dilated in  size with >50% respiratory  variability, suggesting right atrial pressure of 8 mmHg.   Comparison(s): 01/26/20-EF 60-65%.  __________  Carotid artery ultrasound 04/08/2023: Summary:  Right Carotid: Velocities in the right ICA are consistent with a 1-39%  stenosis.   Left Carotid: Velocities in the left ICA are consistent with a 1-39%  stenosis.   Vertebrals: Bilateral vertebral arteries demonstrate antegrade flow.  Subclavians: Normal flow hemodynamics were seen in bilateral subclavian arteries.   Laboratory Data:  Chemistry Recent Labs  Lab 11/01/23 1147 11/03/23 2044 11/04/23 0556  NA 140 137 142  K 4.2 4.1 3.7  CL 102 105 107  CO2 21 24 23   GLUCOSE 78 135* 102*  BUN 16 23 19   CREATININE 1.19 1.18 0.94  CALCIUM  9.9 9.9 9.4  GFRNONAA  --  >60 >60  ANIONGAP  --  8 12    No results for input(s): PROT, ALBUMIN , AST, ALT, ALKPHOS, BILITOT in the last 168 hours. Hematology Recent Labs  Lab 11/01/23 1147 11/03/23 2044 11/04/23 0556  WBC 3.4 4.7 4.9  RBC 5.03 4.73 4.66  HGB 14.5 13.7 13.8  HCT 43.3 40.2 39.7  MCV 86 85.0 85.2  MCH 28.8 29.0 29.6  MCHC 33.5 34.1 34.8  RDW 13.6 14.4 14.6  PLT 231 228 216   Cardiac EnzymesNo results for input(s): TROPONINI in the last 168 hours. No results for input(s): TROPIPOC in the last 168 hours.  BNPNo results for input(s): BNP, PROBNP in the last 168 hours.  DDimer No results for input(s): DDIMER in the last 168 hours.  Radiology/Studies:  DG Chest 1 View Result Date: 11/03/2023 IMPRESSION: No active disease. Electronically Signed   By: Elsie Gravely M.D.   On: 11/03/2023 20:37    Assessment and Plan:   1. CAD s/p CABG with unstable angina:  - Currently, without symptoms of angina or cardiac decompensation  - Troponin has trended to 143 - Continue aspirin  81 mg, atorvastatin  80 mg, carvedilol  12.5 mg twice daily, amlodipine  10 mg  - Heparin  drip  - Remains on nitroglycerin  drip, wean - N.p.o.  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 HTN:  - Blood pressure remains elevated - Wean nitroglycerin  drip - Has not yet received amlodipine  or irbesartan  in the ED - PTA amlodipine  10 mg, carvedilol  12.5 mg twice daily, and irbesartan  150 mg  - Escalate antihypertensive therapy as indicated following administration of amlodipine  and irbesartan     4. HLD:  - LDL 43  - LP(a) pending  - PTA atorvastatin  80 mg   5. Carotid artery stenosis:  - 1 to 39% bilateral ICA stenosis in 03/2023  - Aspirin  and atorvastatin  as outlined above    Informed Consent   Shared Decision Making/Informed Consent{  The risks [stroke (1 in 1000), death (1 in 1000), kidney failure [usually temporary] (1 in 500), bleeding (1 in 200), allergic reaction [possibly serious] (1 in 200)], benefits (diagnostic support and management of coronary artery disease) and alternatives of a cardiac catheterization were discussed in detail with Mr. Brogden and he is willing to proceed.       For questions or updates, please contact [facility] Please consult [email or web address] for contact info under Cardiology/STEMI.   Signed, Bernardino Bring, PA-C [facility] Pager: [phone] 11/04/2023, 8:38 AM

## 2023-11-04 NOTE — Assessment & Plan Note (Signed)
-   The patient will placed on supplemental coverage with NovoLog . - Will continue Farxiga  and the coverage.

## 2023-11-04 NOTE — Progress Notes (Signed)
 [facility] arrived, in process of transferring patient to their care for transport

## 2023-11-04 NOTE — Interval H&P Note (Signed)
 History and Physical Interval Note:  11/04/2023 11:01 AM  Luis Wise  has presented today for surgery, with the diagnosis of ACS/Unstable angina.  The various methods of treatment have been discussed with the patient and family. After consideration of risks, benefits and other options for treatment, the patient has consented to  Procedure(s): LEFT HEART CATH AND CORS/GRAFTS ANGIOGRAPHY (Left)  PERCUTANEOUS CORONARY INTERVENTION  as a surgical intervention.  The patient's history has been reviewed, patient examined, no change in status, stable for surgery.  I have reviewed the patient's chart and labs.  Questions were answered to the patient's satisfaction.    Cath Lab Visit (complete for each Cath Lab visit)  Clinical Evaluation Leading to the Procedure:   ACS: Yes.    Non-ACS:    Anginal Classification: CCS IV  Anti-ischemic medical therapy: Maximal Therapy (2 or more classes of medications)  Non-Invasive Test Results: No non-invasive testing performed  Prior CABG: Previous CABG    Alm Clay

## 2023-11-04 NOTE — Consult Note (Signed)
 Pharmacy Consult Note - Anticoagulation  Pharmacy Consult for Heparin   Indication: chest pain/ACS  PATIENT MEASUREMENTS: Height: 5' 9 (175.3 cm) Weight: 81.8 kg (180 lb 5.4 oz) IBW/kg (Calculated) : 70.7 HEPARIN  DW (KG): 81.8  VITAL SIGNS: Temp: 98.2 F (36.8 C) (08/28 0339) Temp Source: Oral (08/28 0339) BP: 131/64 (08/28 0330) Pulse Rate: 59 (08/28 0330)  Recent Labs     0000 11/03/23 2044 11/03/23 2120 11/03/23 2207 11/04/23 0224 11/04/23 0556  HGB  --  13.7  --   --   --  13.8  HCT  --  40.2  --   --   --  39.7  PLT  --  228  --   --   --  216  APTT  --   --  26  --   --   --   LABPROT  --   --   --   --   --  14.5  INR  --   --   --   --   --  1.1  HEPARINUNFRC   < >  --  <0.10*  --   --  0.27*  CREATININE  --  1.18  --   --   --   --   TROPONINIHS  --  112*  --    < > 143*  --    < > = values in this interval not displayed.    Estimated Creatinine Clearance: 49.9 mL/min (by C-G formula based on SCr of 1.18 mg/dL).  PAST MEDICAL HISTORY: Past Medical History:  Diagnosis Date   Allergy    Amoxicillin   Cataract 2017   Mild progression   Coronary artery disease    a. s/p 5-vessel CABG (LIMA-LAD, SVG-diag, sequential SVG-OM1-OM 2, & SVG-PDA)   Diabetes mellitus with complication (HCC)    Hyperlipidemia    Hypertension    Insomnia    neuropathy    bilateral feet   NSTEMI (non-ST elevated myocardial infarction) (HCC) 11/03/2023   Postoperative atrial fibrillation [facility])     ASSESSMENT: 83 y.o. male with PMH CABG x5, HTN, diabetes is presenting with chest pain. Patient is not on chronic anticoagulation per chart review. Pharmacy has been consulted to initiate and manage heparin  intravenous infusion.  Pending baseline heparin  level and aPTT  Pertinent medications: (Not in a hospital admission)  Scheduled:   amLODipine   10 mg Oral Daily   aspirin  EC  81 mg Oral Daily   atorvastatin   80 mg Oral Daily   carvedilol   12.5 mg Oral BID   cholecalciferol    1,000 Units Oral QPM   cyanocobalamin   1,000 mcg Oral Daily   dapagliflozin  propanediol  10 mg Oral Daily   fenofibrate   160 mg Oral Daily   insulin  aspart  0-5 Units Subcutaneous QHS   insulin  aspart  0-9 Units Subcutaneous TID WC   irbesartan   150 mg Oral Daily   multivitamin  1 tablet Oral BID   traZODone   150 mg Oral QHS   Infusions:   sodium chloride  100 mL/hr at 11/04/23 0604   heparin  1,000 Units/hr (11/03/23 2203)   nitroGLYCERIN       Goal(s) of therapy: Heparin  level 0.3 - 0.7 units/mL aPTT 66 - 102 seconds Monitor platelets by anticoagulation protocol: Yes   Baseline anticoagulation labs: Recent Labs    11/01/23 1147 11/03/23 2044 11/03/23 2120 11/04/23 0556  APTT  --   --  26  --   INR  --   --   --  1.1  HGB 14.5 13.7  --  13.8  PLT 231 228  --  216     8/28@0556 : HL 0.27, subtherapeutic; 1000 units/hr  PLAN: Give 1200 units bolus x1 Increase heparin  infusion to 1200 units/hour. Check heparin  level in 8 hours, then daily once at least two levels are consecutively therapeutic. Monitor CBC daily while on heparin  infusion.   Luis Wise A Luis Wise, PharmD Clinical Pharmacist 11/04/2023 6:46 AM

## 2023-11-04 NOTE — Brief Op Note (Signed)
 BRIEF CARDIAC CATHETERIZATIONNOTE  11/04/2023  1:10 PM  SURGEON:  Surgeons and Role:    * Anner Alm ORN, MD - Primary  PROCEDURE:  Procedure(s): LEFT HEART CATH AND CORS/GRAFTS ANGIOGRAPHY (Left)  PATIENT:  Luis Wise  83 y.o. male with history of coronary artery disease, history of CABG November 2021 postop atrial fibrillation, diabetes type 2, hypertension hyperlipidemia, PAD, carotid stenosis presenting with worsening unstable angina symptoms over the past week or more  .  He was plan for outpatient catheterization today 11/04/2023 however was taking more more nitroglycerin  over the last several days and was admitted through the ER for ACS/troponin of over 100/non-STEMI.  He now presents for urgent catheterization.  PRE-OPERATIVE DIAGNOSIS: Acute coronary syndrome/unstable angina  POST-OPERATIVE DIAGNOSIS:   Heavily calcified RCA with tandem 70%, 99% and 70% lesions in the proximal to mid RCA with 50% distal stenosis at the bifurcation Known 100% LAD that shortly after small caliber D1 with now 100% occluded OM 2 with severely diseased OM 3 Culprit Lesion: Ostial SVG-RPDA occlusion Widely patent SVG-D1, Seq SVG-OM2-OM3, and LIMA-LAD. Normal LVEDP  PROCEDURE PERFORMED: Time Out: Verified patient identification, verified procedure, site/side was marked, verified correct patient position, special equipment/implants available, medications/allergies/relevent history reviewed, required imaging and test results available. Performed.  Access:  Right Radial Artery: 6 Fr sheath -- Seldinger technique using Micropuncture Kit -- Direct ultrasound guidance used.  Permanent image obtained and placed on chart. -- 10 mL radial cocktail IA; 4000 Units IV Heparin   Left Heart Catheterization: 5Fr Catheters advanced or exchanged over a J-wire under direct fluoroscopic guidance into the ascending aorta; JR4 catheter advanced first.  * LV Hemodynamics (no LV Gram): JR4 catheter * Right  Coronary Artery,  (SVG-D1 & SeqSVG-OM2-OM3)  Cineangiography: JR4 catheter  * Left Coronary Artery Cineangiography: JL 4 catheter catheter  *SVG-RPDA Cineangiography: MPA 1 catheter * LIMA-LAD Cineangiography: JR4 catheter was redirected into Left Subclavian Artery & exchanged over long-exchange wire for IMA catheter.  Review of initial angiography revealed: Findings noted above  Preparations are made for evaluation for possible staged atherectomy PCI of the RCA  Upon completion of Angiogaphy, the catheter was removed completely out of the body over a wire, without complication.  Femoral / Brachial Sheath(s) removed in the Cath Lab with manual pressure for hemostasis.    Radial sheath removed in the Cardiac Catheterization lab with TR Band placed for hemostasis.  TR Band: 1240  Hours; 10 mL   MEDICATIONS SQ Lidocaine  3 mL Radial Cocktail: 3 mg Verapmil in 10 mL NS Heparin : 4000 units  ANESTHESIA:   local and IV sedation; 1 mg Versed , 25 mg fentanyl   EBL:  <50 mL  TOURNIQUET:  * No tourniquets in log *  DICTATION: .Note written in EPIC  PLAN OF CARE: Patient will go to the postprocedural unit and potentially after nursing unit for post-cath care.  Will restart IV heparin  2 hours after TR band removal.  Continue IV nitroglycerin  for blood pressure management and keep pain-free.  The plan will be to arrange for transfer to [facility] for staged atherectomy PCI of the RCA.  Will load with oral Plavix  200 mg today and start 75 mg tomorrow. Currently discussing plans with our team at [facility] to determine potential timing of transfer.  PATIENT DISPOSITION:  PACU - hemodynamically stable.   Delay start of Pharmacological VTE agent (>24hrs) due to surgical blood loss or risk of bleeding: not applicable     Alm Anner, MD

## 2023-11-04 NOTE — Assessment & Plan Note (Signed)
-   Will continue antihypertensive therapy.

## 2023-11-04 NOTE — H&P (Signed)
 Cardiology Admission History and Physical   Patient ID: ELIJAHJAMES FUELLING MRN: [id number]; DOB: 1940-04-13   Admission date: 11/04/2023  PCP:  Cleatus Arlyss RAMAN, MD   [facility] Providers Cardiologist:  Evalene Lunger, MD     Chief Complaint: Chest pain  Patient Profile: Luis Wise is a 83 y.o. male with CAD status post 5 vessel CABG in 01/2020 with LIMA to LAD, SVG to diagonal, sequential SVG to OM1 and OM 2, and SVG to PDA, postoperative atrial fibrillation, DM2, HTN, HLD, and carotid artery stenosis who is being seen 11/04/2023 for the evaluation of NSTEMI.  History of Present Illness: Mr. Kapusta was initially evaluated by Dr. Gollan in 12/2019 for chest pain following the ED visit.  He was noted to be markedly hypertensive with systolic blood pressure in the 190s mmHg and having LBBB.  Initial troponin 47 with a delta troponin 43.  He was started on Imdur  and underwent coronary CTA which showed a calcium  score of 5706 with heavily calcified coronary arteries causing significant/severe stenosis of greater than 70% in the mid LAD, mid RCA, and ostial OM1.  FFR was significant in the mid LAD.  Subsequent LHC showed severe heavily calcified multivessel CAD with CTO of the proximal to mid LAD with bridging collaterals and faint right to left collaterals.  He was referred to CVTS and underwent 5 vessel CABG on 01/30/2020.  Preoperative echo showed an EF of 55 to 60% with mild hypokinesis of the apical septal wall, mild concentric LVH, grade 1 diastolic dysfunction, normal RV systolic function and ventricular cavity size, and no significant valvular abnormalities.  Postoperative course was notable for postoperative A-fib treated with amiodarone  and subsequent conversion to sinus rhythm with redevelopment of A-fib briefly thereafter.  He has not been maintained on OAC.  Most recent echo from 11/2021 showed an EF of 45 to 50% with anteroseptal wall hypokinesis, mild LVH, grade 1 diastolic  dysfunction, normal RV systolic function and ventricular cavity size, mild mitral regurgitation, mild aortic insufficiency, aortic valve sclerosis without evidence of stenosis, and an estimated right atrial pressure of 18 mmHg.  He was seen in the office on 11/01/2023 with a several day history of episodic exertional chest discomfort during his typical walk that would improve with rest concerning for accelerating angina.  Pain was rated a 3/10.  He was without symptoms of angina at time of office visit.  He was scheduled for LHC on 11/04/2023.   He called our office on 11/03/2023 reporting increasing angina,  now occurring at rest and waking him from sleep.  He had taken 9 nitroglycerin  since 8/25 with recommendation to proceed to the ED.  He was subsequently admitted on 11/03/2023 with unstable angina.  EKG showed NSR, 67 bpm, known LBBB.  High sensitivity troponin 112 with a delta troponin 118 currently trended to 143.  Chest x-ray without active disease.  BP has ranged from the 120s to 160s mmHg systolic.  In the ER he was given aspirin  324 mg x 1 and placed on nitro and heparin  drips.  At time of cardiology consult he was without symptoms of angina or cardiac decompensation.  He underwent LHC on 11/04/2023 that showed significant underlying native vessel CAD with flush occlusion of SVG to RCA and recommendation for patient to be transferred to [facility] for consideration of potential atherectomy PCI on 11/05/2023.    Past Medical History:  Diagnosis Date   Allergy    Amoxicillin   Cataract 2017  Mild progression   Coronary artery disease    a. s/p 5-vessel CABG (LIMA-LAD, SVG-diag, sequential SVG-OM1-OM 2, & SVG-PDA)   Diabetes mellitus with complication (HCC)    Hyperlipidemia    Hypertension    Insomnia    neuropathy    bilateral feet   NSTEMI (non-ST elevated myocardial infarction) (HCC) 11/03/2023   Postoperative atrial fibrillation [facility])    Past Surgical History:  Procedure Laterality Date    CARDIAC CATHETERIZATION     01/19/2020   COLONOSCOPY WITH PROPOFOL  N/A 04/01/2021   Procedure: COLONOSCOPY WITH PROPOFOL ;  Surgeon: Jinny Carmine, MD;  Location: [facility] ENDOSCOPY;  Service: Endoscopy;  Laterality: N/A;   CORONARY ARTERY BYPASS GRAFT N/A 01/29/2020   Procedure: CORONARY ARTERY BYPASS GRAFTING (CABG) TIMES FIVE USING LIMA to LAD; ENDOSCOPIC HARVESTED GREATER SAPHENOUS VEIN: SVG to PDA; SVG to sequenced OM1 & OM2.;  Surgeon: Fleeta Hanford Coy, MD;  Location: [facility] OR;  Service: Open Heart Surgery;  Laterality: N/A;   ENDOVEIN HARVEST OF GREATER SAPHENOUS VEIN Bilateral 01/29/2020   Procedure: ENDOVEIN HARVEST OF GREATER SAPHENOUS VEIN;  Surgeon: Fleeta Hanford Coy, MD;  Location: [facility] OR;  Service: Open Heart Surgery;  Laterality: Bilateral;   EYE SURGERY  Radial K   1997   LAMINECTOMY  1993   L5-S1   LEFT HEART CATH AND CORONARY ANGIOGRAPHY N/A 01/19/2020   Procedure: LEFT HEART CATH AND CORONARY ANGIOGRAPHY;  Surgeon: Darron Deatrice LABOR, MD;  Location: [facility] INVASIVE CV LAB;  Service: Cardiovascular;  Laterality: N/A;   MENISCUS REPAIR  1996   TEE WITHOUT CARDIOVERSION N/A 01/29/2020   Procedure: TRANSESOPHAGEAL ECHOCARDIOGRAM (TEE);  Surgeon: Fleeta Hanford, Coy, MD;  Location: [facility] OR;  Service: Open Heart Surgery;  Laterality: N/A;   TONSILLECTOMY       Medications Prior to Admission: Prior to Admission medications   Medication Sig Start Date End Date Taking? Authorizing Provider  amLODipine  (NORVASC ) 10 MG tablet Take 1 tablet (10 mg total) by mouth daily. 02/09/23   Cleatus Arlyss RAMAN, MD  aspirin  EC 81 MG tablet Take 81 mg by mouth daily. Swallow whole.    [provider]  atorvastatin  (LIPITOR) 80 MG tablet Take 1 tablet (80 mg total) by mouth daily. 09/07/23   Gollan, Timothy J, MD  benzonatate  (TESSALON ) 200 MG capsule Take 1 capsule (200 mg total) by mouth 3 (three) times daily as needed. Patient not taking: Reported on 11/02/2023 07/27/23   Vincente Shivers, NP  carvedilol   (COREG ) 12.5 MG tablet Take 1 tablet (12.5 mg total) by mouth 2 (two) times daily. 09/07/23   Gollan, Timothy J, MD  cholecalciferol  (VITAMIN D3) 25 MCG (1000 UNIT) tablet Take 1,000 Units by mouth every evening.    [provider]  Coenzyme Q10 (COQ10) 100 MG CAPS Take 100 mg by mouth every evening.    [provider]  Continuous Blood Gluc Transmit (DEXCOM G6 TRANSMITTER) MISC 1 Device by Does not apply route every 3 (three) months. E11.65 06/18/21   Trixie File, MD  cyanocobalamin  (VITAMIN B12) 1000 MCG tablet Take 1,000 mcg by mouth daily.    [provider]  dapagliflozin  propanediol (FARXIGA ) 10 MG TABS tablet TAKE 1 TABLET BY MOUTH EVERY DAY BEFORE BREAKFAST 12/16/22   Trixie File, MD  fenofibrate  160 MG tablet Take 1 tablet (160 mg total) by mouth daily. 02/09/23   Cleatus Arlyss RAMAN, MD  glucose blood (ONETOUCH ULTRA) test strip Use to check blood sugar daily. Dx E11.9 08/06/22   Cleatus Arlyss RAMAN, MD  GVOKE HYPOPEN  2-PACK 1 MG/0.2ML SOAJ INJECT 0.2 MG UNDER SKIN AS NEEDED FOR LOW BLOOD SUGARS 06/21/23   Trixie File, MD  Insulin  Disposable Pump (OMNIPOD 5 G6 PODS, GEN 5,) MISC USE AS INSTRUCTED TO ADMINISTER INSULIN  07/30/22   Trixie File, MD  insulin  lispro (HUMALOG  KWIKPEN) 200 UNIT/ML KwikPen USE AS INSTRUCTED IN THE INSULIN  PUMP MAX DAILY 145 UNITS 03/22/23   Trixie File, MD  Insulin  Pen Needle 32G X 4 MM MISC Use 4x a day 05/02/21   Trixie File, MD  [facility] Omega-3 Krill Oil 350 MG CAPS Take 350 mg by mouth daily.    [provider]  metFORMIN  (GLUCOPHAGE ) 1000 MG tablet TAKE 1 TABLET BY MOUTH TWICE A DAY Patient taking differently: Take 1,500 mg by mouth daily with breakfast. 06/14/23   Cleatus Arlyss RAMAN, MD  Multiple Vitamins-Minerals (PRESERVISION AREDS 2 PO) Take 1 tablet by mouth 2 (two) times daily.     [provider]  nitroGLYCERIN  (NITROSTAT ) 0.4 MG SL tablet Place 1 tablet (0.4 mg total) under the tongue every 5  (five) minutes as needed for chest pain. 11/01/23 01/30/24  Loistine Sober, NP  sacubitril-valsartan  (ENTRESTO ) 49-51 MG Take 1 tablet by mouth 2 (two) times daily.    [provider]  sildenafil  (VIAGRA ) 100 MG tablet Take 1 tablet (100 mg total) by mouth as needed for erectile dysfunction. 02/09/23   Cleatus Arlyss RAMAN, MD  traZODone  (DESYREL ) 150 MG tablet Take 1 tablet (150 mg total) by mouth daily. Patient taking differently: Take 150 mg by mouth at bedtime. 02/09/23   Cleatus Arlyss RAMAN, MD  TURMERIC CURCUMIN PO Take 1,500 mg by mouth in the morning and at bedtime.    [provider]  valsartan  (DIOVAN ) 160 MG tablet Take 1 tablet (160 mg total) by mouth daily. 09/07/23   Gollan, Timothy J, MD     Allergies:    Allergies  Allergen Reactions   Ozempic  (0.25 Or 0.5 Mg-Dose) [Semaglutide (0.25 Or 0.5mg -Dos)]     Presumed cause of GI upset.     Amoxil [Amoxicillin] Rash    Social History:   Social History   Socioeconomic History   Marital status: Married    Spouse name: Not on file   Number of children: Not on file   Years of education: Not on file   Highest education level: Master's degree (e.g., MA, MS, MEng, MEd, MSW, MBA)  Occupational History   Occupation: CFO  Tobacco Use   Smoking status: Never   Smokeless tobacco: Never  Vaping Use   Vaping status: Never Used  Substance and Sexual Activity   Alcohol use: Not Currently    Alcohol/week: 0.0 standard drinks of alcohol    Comment: Very rare- occasionaly beer or wine    Drug use: No   Sexual activity: Yes    Birth control/protection: None  Other Topics Concern   Not on file  Social History Narrative   Married 1970.   [employer]  [employer], masters at [employer] .   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   Social Drivers of Corporate investment banker Strain: Low Risk  (02/05/2023)   Overall  Financial Resource Strain (CARDIA)    Difficulty of Paying Living Expenses: Not hard at all  Food Insecurity: No Food Insecurity (02/05/2023)   Hunger Vital Sign    Worried About Running Out of Food in the Last Year: Never true  Ran Out of Food in the Last Year: Never true  Transportation Needs: No Transportation Needs (02/05/2023)   PRAPARE - Administrator, Civil Service (Medical): No    Lack of Transportation (Non-Medical): No  Physical Activity: Sufficiently Active (02/05/2023)   Exercise Vital Sign    Days of Exercise per Week: 3 days    Minutes of Exercise per Session: 50 min  Stress: No Stress Concern Present (02/05/2023)   [employer] of Occupational Health - Occupational Stress Questionnaire    Feeling of Stress : Not at all  Social Connections: Socially Integrated (02/05/2023)   Social Connection and Isolation Panel    Frequency of Communication with Friends and Family: More than three times a week    Frequency of Social Gatherings with Friends and Family: Once a week    Attends Religious Services: More than 4 times per year    Active Member of [employer] or Organizations: Yes    Attends Engineer, structural: More than 4 times per year    Marital Status: Married  Catering manager Violence: Not At Risk (12/22/2021)   Humiliation, Afraid, Rape, and Kick questionnaire    Fear of Current or Ex-Partner: No    Emotionally Abused: No    Physically Abused: No    Sexually Abused: No     Family History:   The patient's family history includes Diabetes in his father; Hypertension in his mother. There is no history of Cancer, COPD, Heart disease, Hyperlipidemia, Stroke, Colon cancer, or Prostate cancer.    ROS:  Please see the history of present illness.  All other ROS reviewed and negative.     Physical Exam/Data: Vitals:    11/04/23 0645 11/04/23 0700 11/04/23 0714 11/04/23 0730  BP:   (!) 164/109   (!) 162/81  Pulse: (!) 53 62 62 87  Resp:       16   Temp:       98.3 F (36.8 C)  TempSrc:       Oral  SpO2: 94% 97% 95% 96%  Weight:          Height:            No intake or output data in the 24 hours ending 11/04/23 0838    Filed Weights    11/03/23 2026  Weight: 81.8 kg    Body mass index is 26.63 kg/m.    Physical Exam: General: Well developed, well nourished, in no acute distress. Head: Normocephalic, atraumatic, sclera non-icteric, no xanthomas, nares without discharge.       Neck: Negative for carotid bruits. JVD not elevated. Lungs: Clear bilaterally to auscultation without wheezes, rales, or rhonchi. Breathing is unlabored. Heart: RRR with S1 S2. No murmurs, rubs, or gallops appreciated. Abdomen: Soft, non-tender, non-distended with normoactive bowel sounds. No hepatomegaly. No rebound/guarding. No obvious abdominal masses. Msk:  Strength and tone appear normal for age. Extremities: No clubbing or cyanosis. No edema. Distal pedal pulses are 2+ and equal bilaterally. Neuro: Alert and oriented X 3. No facial asymmetry. No focal deficit. Moves all extremities spontaneously. Psych:  Responds to questions appropriately with a normal affect.  EKG:  The ECG that was done 11/03/2023 was personally reviewed and demonstrates NSR, 67 bpm, known LBBB   Relevant CV Studies: Coronary CTA 01/11/2020: Aorta: Normal size. Minimal descending aortic wall calcifications. No dissection.   Aortic Valve:  Trileaflet.  No calcifications.   Coronary Arteries:  Normal coronary origin.  Right dominance.   RCA is  a large dominant artery that gives rise to PDA and PLA. Heavily calcified plaque throughout the vessel causing significant stenosis in the mid vessel (>70%)   Left main is a large artery that gives rise to LAD and LCX arteries.   LAD is a large vessel that has calcified plaque causing mild stenosis in the proximal segment (25-49%) and severe stenosis in the mid segment (>70%).   LCX is a non-dominant artery that gives rise two  obtuse marginal branches. There is calcified in the proximal LCx segment causing mild stenosis, calcified plaque in the ostial first obtuse marginal causing severe stenosis.   Other findings:   Normal pulmonary vein drainage into the left atrium.   Normal left atrial appendage without a thrombus.   Normal size of the pulmonary artery.   IMPRESSION: 1. Coronary calcium  score of 5706. This was 98th percentile for age and sex matched control. 2. Normal coronary origin with right dominance. 3. Heavily calcified coronary arteries causing significant/severe stenosis (>70%) in the mid LAD, mid RCA, ostial OM1 4. CAD-RADS 4 Severe stenosis. (70-99% or > 50% left main). Cardiac catheterization or CT FFR is recommended. Consider symptom-guided anti-ischemic pharmacotherapy as well as risk factor modification per guideline directed care. Additional analysis with CT FFR will be submitted and reported separately.   ctFFR: 1. Left Main:  No significant stenosis. 2. LAD: Significant stenosis in the mid segment.  FFRct 0.54 3. LCX: No significant stenosis. 4. RCA: No significant stenosis.   IMPRESSION: 1.  CT FFR analysis showed significant stenosis in the mid LAD. __________   [facility] 01/19/2020: Prox RCA lesion is 60% stenosed. Dist RCA lesion is 50% stenosed. RPDA lesion is 50% stenosed. Mid RCA lesion is 70% stenosed. 2nd Mrg lesion is 80% stenosed. Dist Cx lesion is 80% stenosed. Prox LAD to Mid LAD lesion is 100% stenosed. 1st Diag lesion is 80% stenosed. Prox LAD lesion is 70% stenosed. The left ventricular systolic function is normal. LV end diastolic pressure is mildly elevated. The left ventricular ejection fraction is 55-65% by visual estimate.   1.  Severe heavily calcified three-vessel coronary artery disease.  Chronic occlusion of the proximal/mid LAD with bridging collaterals as well as faint right to left collaterals. 2.  Normal LV systolic function and mildly elevated  left ventricular end-diastolic pressure at 13 mmHg.   Recommendations: Given diabetic status, heavily calcified 3 vessels disease and chronic occlusion of the LAD, I think the best option is CABG.  The anterior wall motion appears to be normal and thus the LAD territory seems to be viable. I instructed the patient to discontinue clopidogrel  and continue aspirin . Consult CVTS for CABG. __________   Echo 01/26/2020: 1. Left ventricular ejection fraction, by estimation, is 55 to 60%. The  left ventricle has normal function. The left ventricle demonstrates  regional wall motion abnormalities (see scoring diagram/findings for  description). There is mild concentric left  ventricular hypertrophy. Left ventricular diastolic parameters are  consistent with Grade I diastolic dysfunction (impaired relaxation). There  is mild hypokinesis of the left ventricular, apical septal wall.   2. Right ventricular systolic function is normal. The right ventricular  size is normal.   3. The mitral valve is normal in structure. Trivial mitral valve  regurgitation. No evidence of mitral stenosis.   4. The aortic valve is normal in structure. Aortic valve regurgitation is  not visualized. No aortic stenosis is present.   5. The inferior vena cava is normal in size with greater than 50%  respiratory variability, suggesting right atrial pressure of 3 mmHg.  __________   Echo 11/27/2021: 1. Left ventricular ejection fraction, by estimation, is 45 to 50%. The  left ventricle has mildly decreased function. The left ventricle  demonstrates regional wall motion abnormalities (anteroseptal wall  hypokinesis, possibly secondary to post-operative   state). There is mild left ventricular hypertrophy. Left ventricular  diastolic parameters are consistent with Grade I diastolic dysfunction  (impaired relaxation).   2. Right ventricular systolic function is normal. The right ventricular  size is normal.   3. The mitral  valve is normal in structure. Mild mitral valve  regurgitation. No evidence of mitral stenosis.   4. The aortic valve is normal in structure. There is mild calcification  of the aortic valve. Aortic valve regurgitation is mild. Aortic valve  sclerosis is present, with no evidence of aortic valve stenosis.   5. The inferior vena cava is dilated in size with >50% respiratory  variability, suggesting right atrial pressure of 8 mmHg.   Comparison(s): 01/26/20-EF 60-65%.  __________   Carotid artery ultrasound 04/08/2023: Summary:  Right Carotid: Velocities in the right ICA are consistent with a 1-39%  stenosis.   Left Carotid: Velocities in the left ICA are consistent with a 1-39%  stenosis.   Vertebrals: Bilateral vertebral arteries demonstrate antegrade flow.  Subclavians: Normal flow hemodynamics were seen in bilateral subclavian arteries.  __________  [facility] 11/04/2023: POST-OPERATIVE DIAGNOSIS:   Heavily calcified RCA with tandem 70%, 99% and 70% lesions in the proximal to mid RCA with 50% distal stenosis at the bifurcation Known 100% LAD that shortly after small caliber D1 with now 100% occluded OM 2 with severely diseased OM 3 Culprit Lesion: Ostial SVG-RPDA occlusion Widely patent SVG-D1, Seq SVG-OM2-OM3, and LIMA-LAD. Normal LVEDP   Laboratory Data: High Sensitivity Troponin:   Recent Labs  Lab 11/03/23 2044 11/03/23 2207 11/04/23 0017 11/04/23 0224  TROPONINIHS 112* 118* 127* 143*      Chemistry Recent Labs  Lab 11/03/23 2044 11/04/23 0017 11/04/23 0556  NA 137  --  142  K 4.1  --  3.7  CL 105  --  107  CO2 24  --  23  GLUCOSE 135*  --  102*  BUN 23  --  19  CREATININE 1.18  --  0.94  CALCIUM  9.9  --  9.4  MG  --  2.4  --   GFRNONAA >60  --  >60  ANIONGAP 8  --  12    No results for input(s): PROT, ALBUMIN , AST, ALT, ALKPHOS, BILITOT in the last 168 hours. Lipids  Recent Labs  Lab 11/04/23 0556  CHOL 94  TRIG 130  HDL 25*  LDLCALC 43   CHOLHDL 3.8   Hematology Recent Labs  Lab 11/03/23 2044 11/04/23 0556  WBC 4.7 4.9  RBC 4.73 4.66  HGB 13.7 13.8  HCT 40.2 39.7  MCV 85.0 85.2  MCH 29.0 29.6  MCHC 34.1 34.8  RDW 14.4 14.6  PLT 228 216   Thyroid  No results for input(s): TSH, FREET4 in the last 168 hours. BNPNo results for input(s): BNP, PROBNP in the last 168 hours.  DDimer No results for input(s): DDIMER in the last 168 hours.  Radiology/Studies:  DG Chest 1 View Result Date: 11/03/2023 IMPRESSION: No active disease. Electronically Signed   By: Elsie Gravely M.D.   On: 11/03/2023 20:37     Assessment and Plan:  CAD s/p CABG with NSTEMI:  - Currently, without symptoms of angina or  cardiac decompensation  - LHC at [facility] on 8/28 showed severe native vessel CAD with flush occlusion of SVG to RCA with recommendation for patient to be transferred to [facility] for potential atherectomy PCI, tentatively scheduled for 8/29  - Continue heparin  drip post TR band removal and nitro drip  - Continue aspirin  81 mg, atorvastatin  80 mg, carvedilol  12.5 mg twice daily, amlodipine  10 mg  - N.p.o. 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:  - Euvolemic and well compensated  - Most recent echo from 11/2021 showed a reduction in LV systolic function with an EF of 45 to 50%, previously normal by echo in 2021  - LHC as above  - Echo pending  - PTA carvedilol  12.5 mg twice daily, irbesartan  150 mg daily, and Farxiga  10 mg daily  - Anticipate escalation of GDMT post cath as tolerated and able with possible transition from amlodipine  and addition of MRA and considering transitioning irbesartan  to Entresto  (previously financial constraints limited this)  - Not requiring a standing loop diuretic   HTN:  - Blood pressure remains elevated, though improving  - Currently on nitro drip as above  - Currently on PTA amlodipine  10 mg, carvedilol  12.5 mg twice daily, and irbesartan  150  mg  - Escalate antihypertensive therapy as indicated following administration of amlodipine  and irbesartan     HLD:  - LDL 43  - LP(a) pending  - PTA atorvastatin  80 mg and fenofibrate  160 mg  Carotid artery stenosis:  - 1 to 39% bilateral ICA stenosis in 03/2023  - Aspirin  and atorvastatin  as outlined above     Risk Assessment/Risk Scores:  TIMI Risk Score for Unstable Angina or Non-ST Elevation MI:   The patient's TIMI risk score is 6, which indicates a 41% risk of all cause mortality, new or recurrent myocardial infarction or need for urgent revascularization in the next 14 days.{   [place]  Heart Association (NYHA) Functional Class NYHA Class II   Code Status: Full Code  Severity of Illness: The appropriate patient status for this patient is INPATIENT. Inpatient status is judged to be reasonable and necessary in order to provide the required intensity of service to ensure the patient's safety. The patient's presenting symptoms, physical exam findings, and initial radiographic and laboratory data in the context of their chronic comorbidities is felt to place them at high risk for further clinical deterioration. Furthermore, it is not anticipated that the patient will be medically stable for discharge from the hospital within 2 midnights of admission.   * I certify that at the point of admission it is my clinical judgment that the patient will require inpatient hospital care spanning beyond 2 midnights from the point of admission due to high intensity of service, high risk for further deterioration and high frequency of surveillance required.*  For questions or updates, please contact [facility] Please consult [email or web address] for contact info under     Signed, Bernardino Bring, PA-C  11/04/2023 1:41 PM

## 2023-11-04 NOTE — Discharge Summary (Signed)
 [facility] Discharge Summary   Patient: Luis Wise FMW:[id number]  PCP: Cleatus Arlyss RAMAN, MD  Date of admission: 11/03/2023   Date of discharge:  11/04/2023     Discharge Diagnoses:  Principal Problem:   NSTEMI (non-ST elevated myocardial infarction) [facility]) Active Problems:   Unstable angina (HCC)   Essential hypertension   Dyslipidemia   Type 2 diabetes mellitus without complication, with long-term current use of insulin  (HCC)   Admitted From: Home Disposition:  Home   Recommendations for Outpatient Follow-up:  PCP: in 1wk Follow up LABS/TEST:  as per cards   Follow-up Information     Cleatus Arlyss RAMAN, MD Follow up in 1 week(s).   Specialty: Family Medicine Contact information: [address] [place] [address] [phone]                Diet recommendation: Cardiac and Carb modified diet  Activity: The patient is advised to gradually reintroduce usual activities, as tolerated  Discharge Condition: stable  Code Status: Full code   History of present illness: As per the H and P dictated on admission. Hospital Course:  Luis Wise is a 83 y.o. male with medical history significant for type II diabetes mellitus, hypertension, coronary artery disease status post CABG, dyslipidemia, peripheral neuropathy and atrial fibrillation, who presented to the emergency room with acute onset of midsternal chest pain for the past week started after he had a 3 mile hike but has been fairly frequent since then.  He described it as pressure and graded 2-3/10 in severity with radiation across the chest without nausea or vomiting or diaphoresis.  It has been improving with sublingual medicine.  It became more and more frequent every day.  He was seen by cardiology a few days ago and was scheduled to have a heart catheterization tomorrow.  Today's pain became more severe and more frequent.  Used 7 doses of sublingual nitroglycerin  throughout the day today and  therefore came to the ER.   h or wheezing or hemoptysis.  No leg pain or edema or recent travels or surgeries.  Normal bowel pain.  No dysuria, oliguria or hematuria or flank pain.  No other bleeding diathesis.   ED Course: When he came to the ER, vital signs showed a BP of 143/69 with otherwise normal vital signs.  Labs revealed unremarkable BMP.  High-sensitivity opponent was 112 and later 118.  CBC was normal. EKG as reviewed by me : EKG showed sinus rhythm with a rate of 67 with left bundle branch block. Imaging: Portable chest x-ray showed no acute cardiopulmonary disease.   The patient was given IV heparin  bolus and drip.  He will be admitted to a progressive unit bed for further evaluation and management.  Assessment and Plan:  # NSTEMI (non-ST elevated myocardial infarction) Continue aspirin  81 mg p.o. daily. Continue heparin  IV infusion and nitro infusion. Cardiology was consulted, s/p cardiac cath: CAD with flush occlusion of SVG to RCA and recommendation for patient to be transferred to [facility] for consideration of potential atherectomy PCI on 11/05/2023.  Dr. Alm Clay discussed with Dr. Ozell Fell who accepted patient to do CSI-PCI tomorrow a.m. Patient agreed with the transfer to [facility].  Seems stable to transfer today. Cardiology recommended to continue heparin  IV infusion and nitro infusion for now.    # Type 2 diabetes mellitus without complication, with long-term current use of insulin  [facility]) Patient was using insulin  pump, agreed to use insulin  sliding scale. Start  NovoLog  sliding scale, monitor CBG and continue diabetic diet - continue Farxiga     Dyslipidemia: Continue statin therapy  Essential hypertension: continue antihypertensive therapy.   Body mass index is 26.63 kg/m.  Nutrition Interventions:   On the day of the discharge the patient's vitals were stable, and no other acute medical condition were reported by patient. the patient was felt  safe to be discharged and transferred to [facility] under cardiology service for thrombectomy PCI.   Consultants: Cardiology Procedures: LHC  Discharge Exam: General: Appear in no distress. Cardiovascular: S1 and S2 Present, no Murmur, Respiratory: normal respiratory effort, Bilateral Air entry present and no Crackles, no wheezes Abdomen: Bowel Sound present, Soft and no tenderness, no hernia Extremities: no Pedal edema, no calf tenderness Neurology: alert and oriented to time, place, and person affect appropriate.  Filed Weights   11/03/23 2026  Weight: 81.8 kg   Vitals:   11/04/23 1415 11/04/23 1430  BP: (!) 164/77 (!) 157/73  Pulse: 66 67  Resp: 16 16  Temp:    SpO2: 96% 96%    DISCHARGE MEDICATION: Allergies as of 11/04/2023       Reactions   Ozempic  (0.25 Or 0.5 Mg-dose) [semaglutide (0.25 Or 0.5mg -dos)]    Presumed cause of GI upset.     Amoxil [amoxicillin] Rash        Medication List     STOP taking these medications    benzonatate  200 MG capsule Commonly known as: TESSALON    Entresto  49-51 MG Generic drug: sacubitril-valsartan        TAKE these medications    amLODipine  10 MG tablet Commonly known as: NORVASC  Take 1 tablet (10 mg total) by mouth daily.   aspirin  EC 81 MG tablet Take 81 mg by mouth daily. Swallow whole.   atorvastatin  80 MG tablet Commonly known as: LIPITOR Take 1 tablet (80 mg total) by mouth daily.   carvedilol  12.5 MG tablet Commonly known as: COREG  Take 1 tablet (12.5 mg total) by mouth 2 (two) times daily.   cholecalciferol  25 MCG (1000 UNIT) tablet Commonly known as: VITAMIN D3 Take 1,000 Units by mouth every evening.   clopidogrel  75 MG tablet Commonly known as: PLAVIX  Take 1 tablet (75 mg total) by mouth once for 1 dose. Start taking on: November 05, 2023   CoQ10 100 MG Caps Take 100 mg by mouth every evening.   cyanocobalamin  1000 MCG tablet Commonly known as: VITAMIN B12 Take 1,000 mcg by mouth daily.    Dexcom G6 Transmitter Misc 1 Device by Does not apply route every 3 (three) months. E11.65   Farxiga  10 MG Tabs tablet Generic drug: dapagliflozin  propanediol TAKE 1 TABLET BY MOUTH EVERY DAY BEFORE BREAKFAST   fenofibrate  160 MG tablet Take 1 tablet (160 mg total) by mouth daily.   Gvoke HypoPen  2-Pack 1 MG/0.2ML Soaj Generic drug: Glucagon  INJECT 0.2 MG UNDER SKIN AS NEEDED FOR LOW BLOOD SUGARS   heparin  25000 UT/250ML infusion Inject 1,200 Units/hr into the vein continuous.   HumaLOG  KwikPen 200 UNIT/ML KwikPen Generic drug: insulin  lispro USE AS INSTRUCTED IN THE INSULIN  PUMP MAX DAILY 145 UNITS   Insulin  Pen Needle 32G X 4 MM Misc Use 4x a day   isosorbide  mononitrate 30 MG 24 hr tablet Commonly known as: IMDUR  Take 1 tablet (30 mg total) by mouth every evening. Start taking on: November 05, 2023   MegaRed Omega-3 Krill Oil 350 MG Caps Take 350 mg by mouth daily.   metFORMIN  1000 MG tablet Commonly known  as: GLUCOPHAGE  TAKE 1 TABLET BY MOUTH TWICE A DAY What changed:  how much to take when to take this   nitroGLYCERIN  0.2 mg/mL infusion Inject 0-200 mcg/min into the vein continuous.   nitroGLYCERIN  0.4 MG SL tablet Commonly known as: NITROSTAT  Place 1 tablet (0.4 mg total) under the tongue every 5 (five) minutes as needed for chest pain.   Omnipod 5 DexG7G6 Pods Gen 5 Misc USE AS INSTRUCTED TO ADMINISTER INSULIN    OneTouch Ultra test strip Generic drug: glucose blood Use to check blood sugar daily. Dx E11.9   PRESERVISION AREDS 2 PO Take 1 tablet by mouth 2 (two) times daily.   sildenafil  100 MG tablet Commonly known as: Viagra  Take 1 tablet (100 mg total) by mouth as needed for erectile dysfunction.   traZODone  150 MG tablet Commonly known as: DESYREL  Take 1 tablet (150 mg total) by mouth daily. What changed: when to take this   TURMERIC CURCUMIN PO Take 1,500 mg by mouth in the morning and at bedtime.   valsartan  160 MG tablet Commonly known  as: DIOVAN  Take 1 tablet (160 mg total) by mouth daily.       Allergies  Allergen Reactions   Ozempic  (0.25 Or 0.5 Mg-Dose) [Semaglutide (0.25 Or 0.5mg -Dos)]     Presumed cause of GI upset.     Amoxil [Amoxicillin] Rash   Discharge Instructions     AMB referral to Phase II Cardiac Rehabilitation   Complete by: As directed    Diagnosis:  NSTEMI Coronary Stents     After initial evaluation and assessments completed: Virtual Based Care may be provided alone or in conjunction with Phase 2 Cardiac Rehab based on patient barriers.: Yes   Intensive Cardiac Rehabilitation (ICR) [facility] location only OR Traditional Cardiac Rehabilitation (TCR) *If criteria for ICR are not met will enroll in TCR [facility] only): Yes   Call MD for:  difficulty breathing, headache or visual disturbances   Complete by: As directed    Call MD for:  extreme fatigue   Complete by: As directed    Call MD for:  persistant dizziness or light-headedness   Complete by: As directed    Call MD for:  persistant nausea and vomiting   Complete by: As directed    Call MD for:  severe uncontrolled pain   Complete by: As directed    Call MD for:  temperature >100.4   Complete by: As directed    Diet - low sodium heart healthy   Complete by: As directed    Discharge instructions   Complete by: As directed    Follow cardiology for follow-up instructions after discharge.   Increase activity slowly   Complete by: As directed        The results of significant diagnostics from this hospitalization (including imaging, microbiology, ancillary and laboratory) are listed below for reference.    Significant Diagnostic Studies: DG Chest 1 View Result Date: 11/03/2023 CLINICAL DATA:  Chest pain for weeks, worse tonight. EXAM: CHEST  1 VIEW COMPARISON:  02/28/2020 FINDINGS: Postoperative changes in the mediastinum. Heart size and pulmonary vascularity are normal. Lungs are clear. No pleural effusion or pneumothorax. Mediastinal contours  appear intact. Calcification of the aorta. IMPRESSION: No active disease. Electronically Signed   By: Elsie Gravely M.D.   On: 11/03/2023 20:37    Microbiology: No results found for this or any previous visit (from the past 240 hours).   Labs: CBC: Recent Labs  Lab 11/01/23 1147 11/03/23 2044 11/04/23 9443  WBC 3.4 4.7 4.9  HGB 14.5 13.7 13.8  HCT 43.3 40.2 39.7  MCV 86 85.0 85.2  PLT 231 228 216   Basic Metabolic Panel: Recent Labs  Lab 11/01/23 1147 11/03/23 2044 11/04/23 0017 11/04/23 0556  NA 140 137  --  142  K 4.2 4.1  --  3.7  CL 102 105  --  107  CO2 21 24  --  23  GLUCOSE 78 135*  --  102*  BUN 16 23  --  19  CREATININE 1.19 1.18  --  0.94  CALCIUM  9.9 9.9  --  9.4  MG  --   --  2.4  --   PHOS  --   --  3.2  --    Liver Function Tests: No results for input(s): AST, ALT, ALKPHOS, BILITOT, PROT, ALBUMIN  in the last 168 hours. No results for input(s): LIPASE, AMYLASE in the last 168 hours. No results for input(s): AMMONIA in the last 168 hours. Cardiac Enzymes: No results for input(s): CKTOTAL, CKMB, CKMBINDEX, TROPONINI in the last 168 hours. BNP (last 3 results) No results for input(s): BNP in the last 8760 hours. CBG: Recent Labs  Lab 11/04/23 0215 11/04/23 1310  GLUCAP 73 91    Time spent: 35 minutes  Signed:  Elvan Sor  [facility] 11/04/2023 2:35 PM

## 2023-11-05 ENCOUNTER — Ambulatory Visit (HOSPITAL_COMMUNITY): Admission: RE | Admit: 2023-11-05 | Source: Home / Self Care | Admitting: Cardiovascular Disease

## 2023-11-05 ENCOUNTER — Other Ambulatory Visit: Payer: Self-pay

## 2023-11-05 ENCOUNTER — Encounter: Payer: Self-pay | Admitting: Cardiology

## 2023-11-05 ENCOUNTER — Inpatient Hospital Stay (HOSPITAL_COMMUNITY)

## 2023-11-05 ENCOUNTER — Encounter (HOSPITAL_COMMUNITY)
Admission: EM | Disposition: A | Discharge status: Home / Self Care | Payer: Self-pay | Source: Home / Self Care | Attending: Cardiovascular Disease

## 2023-11-05 DIAGNOSIS — I2510 Atherosclerotic heart disease of native coronary artery without angina pectoris: Secondary | ICD-10-CM

## 2023-11-05 DIAGNOSIS — I214 Non-ST elevation (NSTEMI) myocardial infarction: Secondary | ICD-10-CM | POA: Diagnosis not present

## 2023-11-05 HISTORY — PX: CORONARY ATHERECTOMY: CATH118238

## 2023-11-05 HISTORY — PX: CORONARY STENT INTERVENTION: CATH118234

## 2023-11-05 LAB — BASIC METABOLIC PANEL WITH GFR
Anion gap: 12 (ref 5–15)
BUN: 21 mg/dL (ref 8–23)
CO2: 21 mmol/L — ABNORMAL LOW (ref 22–32)
Calcium: 9.5 mg/dL (ref 8.9–10.3)
Chloride: 104 mmol/L (ref 98–111)
Creatinine, Ser: 1.25 mg/dL — ABNORMAL HIGH (ref 0.61–1.24)
GFR, Estimated: 58 mL/min — ABNORMAL LOW (ref >60)
Glucose, Bld: 141 mg/dL — ABNORMAL HIGH (ref 70–99)
Potassium: 4 mmol/L (ref 3.5–5.1)
Sodium: 137 mmol/L (ref 135–145)

## 2023-11-05 LAB — GLUCOSE, CAPILLARY
Glucose-Capillary: 150 mg/dL — ABNORMAL HIGH (ref 70–99)
Glucose-Capillary: 153 mg/dL — ABNORMAL HIGH (ref 70–99)
Glucose-Capillary: 121 mg/dL — ABNORMAL HIGH (ref 70–99)
Glucose-Capillary: 108 mg/dL — ABNORMAL HIGH (ref 70–99)
Glucose-Capillary: 127 mg/dL — ABNORMAL HIGH (ref 70–99)

## 2023-11-05 LAB — CBC
HCT: 40.1 % (ref 39.0–52.0)
Hemoglobin: 14 g/dL (ref 13.0–17.0)
MCH: 29.3 pg (ref 26.0–34.0)
MCHC: 34.9 g/dL (ref 30.0–36.0)
MCV: 83.9 fL (ref 80.0–100.0)
Platelets: 224 K/uL (ref 150–400)
RBC: 4.78 MIL/uL (ref 4.22–5.81)
RDW: 14.6 % (ref 11.5–15.5)
WBC: 6.7 K/uL (ref 4.0–10.5)
nRBC: 0 % (ref 0.0–0.2)

## 2023-11-05 LAB — POCT ACTIVATED CLOTTING TIME
Activated Clotting Time: 256 s
Activated Clotting Time: 302 s
Activated Clotting Time: 250 s
Activated Clotting Time: 262 s

## 2023-11-05 LAB — HEPARIN LEVEL (UNFRACTIONATED): Heparin Unfractionated: 0.23 [IU]/mL — ABNORMAL LOW (ref 0.30–0.70)

## 2023-11-05 SURGERY — CORONARY STENT INTERVENTION
Anesthesia: LOCAL

## 2023-11-05 MED ORDER — NITROGLYCERIN 1 MG/10 ML FOR IR/CATH LAB
INTRA_ARTERIAL | Status: AC
  Filled 2023-11-05: qty 10

## 2023-11-05 MED ORDER — SODIUM CHLORIDE 0.9 % IV SOLN
INTRAVENOUS | Status: AC | PRN
  Administered 2023-11-05: 390 mg via INTRAVENOUS

## 2023-11-05 MED ORDER — SODIUM CHLORIDE 0.9% FLUSH
3.0000 mL | Freq: Two times a day (BID) | INTRAVENOUS | Status: DC
  Administered 2023-11-05 – 2023-11-06 (×2): 3 mL via INTRAVENOUS

## 2023-11-05 MED ORDER — MIDAZOLAM HCL 2 MG/2ML IJ SOLN
INTRAMUSCULAR | Status: AC
  Filled 2023-11-05: qty 2

## 2023-11-05 MED ORDER — LIDOCAINE HCL (PF) 1 % IJ SOLN
INTRAMUSCULAR | Status: AC
  Filled 2023-11-05: qty 30

## 2023-11-05 MED ORDER — VIPERSLIDE LUBRICANT OPTIME: TOPICAL | Status: DC | PRN

## 2023-11-05 MED ORDER — VERAPAMIL HCL 2.5 MG/ML IV SOLN
INTRAVENOUS | Status: AC
  Filled 2023-11-05: qty 2

## 2023-11-05 MED ORDER — IOHEXOL 350 MG/ML SOLN
INTRAVENOUS | Status: DC | PRN
  Administered 2023-11-05: 90 mL via INTRA_ARTERIAL

## 2023-11-05 MED ORDER — MIDAZOLAM HCL 2 MG/2ML IJ SOLN
INTRAMUSCULAR | Status: DC | PRN
  Administered 2023-11-05: 2 mg via INTRAVENOUS

## 2023-11-05 MED ORDER — VERAPAMIL HCL 2.5 MG/ML IV SOLN: INTRAVENOUS | Status: DC | PRN

## 2023-11-05 MED ORDER — NITROGLYCERIN 1 MG/10 ML FOR IR/CATH LAB
INTRA_ARTERIAL | Status: DC | PRN
  Administered 2023-11-05: 100 ug via INTRACORONARY
  Administered 2023-11-05 (×2): 200 ug via INTRACORONARY

## 2023-11-05 MED ORDER — SODIUM CHLORIDE 0.9% FLUSH: 3.0000 mL | INTRAVENOUS | Status: DC | PRN

## 2023-11-05 MED ORDER — FREE WATER
500.0000 mL | Freq: Once | Status: AC
  Administered 2023-11-05: 500 mL via ORAL

## 2023-11-05 MED ORDER — FENTANYL CITRATE (PF) 100 MCG/2ML IJ SOLN
INTRAMUSCULAR | Status: DC | PRN
  Administered 2023-11-05: 25 ug via INTRAVENOUS

## 2023-11-05 MED ORDER — FENTANYL CITRATE (PF) 100 MCG/2ML IJ SOLN
INTRAMUSCULAR | Status: AC
  Filled 2023-11-05: qty 2

## 2023-11-05 MED ORDER — HEPARIN SODIUM (PORCINE) 1000 UNIT/ML IJ SOLN
INTRAMUSCULAR | Status: AC
  Filled 2023-11-05: qty 10

## 2023-11-05 MED ORDER — HYDRALAZINE HCL 20 MG/ML IJ SOLN: 10.0000 mg | INTRAMUSCULAR | Status: AC | PRN

## 2023-11-05 MED ORDER — SODIUM CHLORIDE 0.9 % IV SOLN
250.0000 mL | INTRAVENOUS | Status: DC | PRN
Start: 2023-11-05 — End: 2023-11-06

## 2023-11-05 MED ORDER — SODIUM CHLORIDE 0.9 % IV SOLN: INTRAVENOUS | Status: AC

## 2023-11-05 MED ORDER — HEPARIN SODIUM (PORCINE) 1000 UNIT/ML IJ SOLN
INTRAMUSCULAR | Status: DC | PRN
  Administered 2023-11-05: 4000 [IU] via INTRAVENOUS
  Administered 2023-11-05: 8000 [IU] via INTRAVENOUS
  Administered 2023-11-05: 2000 [IU] via INTRAVENOUS
  Administered 2023-11-05: 4000 [IU] via INTRAVENOUS

## 2023-11-05 MED ORDER — ASPIRIN 81 MG PO CHEW
81.0000 mg | CHEWABLE_TABLET | ORAL | Status: AC
  Administered 2023-11-05: 81 mg via ORAL
  Filled 2023-11-05: qty 1

## 2023-11-05 MED ORDER — SODIUM CHLORIDE 0.9 % IV SOLN
5.0000 mg/kg | Freq: Once | INTRAVENOUS | Status: DC
  Filled 2023-11-05: qty 15.6

## 2023-11-05 MED ORDER — LABETALOL HCL 5 MG/ML IV SOLN: 10.0000 mg | INTRAVENOUS | Status: AC | PRN

## 2023-11-05 SURGICAL SUPPLY — 23 items
BALLOON SAPPHIRE 3.0X15 (BALLOONS) IMPLANT
BALLOON SAPPHIRE NC24 3.75X15 (BALLOONS) IMPLANT
BALLOON SCOREFLEX 3.0X15 (BALLOONS) IMPLANT
CATH TELEPORT (CATHETERS) IMPLANT
CATH VISTA GUIDE 6FR AL1 MULPK (CATHETERS) IMPLANT
CROWN DIAMONDBACK CLASSIC 1.25 (BURR) IMPLANT
DEVICE RAD COMP TR BAND LRG (VASCULAR PRODUCTS) IMPLANT
ELECT DEFIB PAD ADLT CADENCE (PAD) IMPLANT
GLIDESHEATH SLEND SS 6F .021 (SHEATH) IMPLANT
GUIDEWIRE INQWIRE 1.5J.035X260 (WIRE) IMPLANT
KIT ENCORE 26 ADVANTAGE (KITS) IMPLANT
KIT HEMO VALVE WATCHDOG (MISCELLANEOUS) IMPLANT
KIT SINGLE USE MANIFOLD (KITS) IMPLANT
LUBRICANT VIPERSLIDE CORONARY (MISCELLANEOUS) IMPLANT
PACK CARDIAC CATHETERIZATION (CUSTOM PROCEDURE TRAY) ×1 IMPLANT
SET ATX-X65L (MISCELLANEOUS) IMPLANT
STENT ONYX FRONTIER 3.0X12 (Permanent Stent) IMPLANT
STENT ONYX FRONTIER 3.5X26 (Permanent Stent) IMPLANT
STENT ONYX FRONTIER 3.5X34 (Permanent Stent) IMPLANT
TUBING CIL FLEX 10 FLL-RA (TUBING) IMPLANT
WIRE HI TORQ WHISPER MS 300CM (WIRE) IMPLANT
WIRE RUNTHROUGH .014X180CM (WIRE) IMPLANT
WIRE VIPERWIRE COR FLEX .012 (WIRE) IMPLANT

## 2023-11-05 NOTE — Progress Notes (Addendum)
 Rounding Note   Patient Name: Luis Wise Date of Encounter: 11/05/2023  [facility] Cardiologist: Evalene Lunger, MD   Subjective Resting flat in bed, no chest pain. Ready for cath today.  Scheduled Meds:  amLODipine   10 mg Oral Daily   aspirin  EC  81 mg Oral Daily   atorvastatin   80 mg Oral Daily   carvedilol   12.5 mg Oral BID WC   cholecalciferol   1,000 Units Oral QHS   clopidogrel   75 mg Oral Q breakfast   vitamin B-12  1,000 mcg Oral Daily   dapagliflozin  propanediol  10 mg Oral Daily   fenofibrate   160 mg Oral Daily   insulin  aspart  0-15 Units Subcutaneous TID WC   insulin  aspart  0-5 Units Subcutaneous QHS   irbesartan   150 mg Oral Daily   multivitamin  1 tablet Oral Daily   traZODone   150 mg Oral QHS   Continuous Infusions:  sodium chloride      heparin  1,300 Units/hr (11/05/23 0636)   nitroGLYCERIN  25 mcg/min (11/05/23 0807)   PRN Meds: acetaminophen , nitroGLYCERIN , ondansetron  (ZOFRAN ) IV   Vital Signs  Vitals:   11/05/23 0300 11/05/23 0400 11/05/23 0600 11/05/23 0804  BP: (!) 160/80 134/68  (!) 161/75  Pulse:    63  Resp:  20  18  Temp:  98.1 F (36.7 C)  97.9 F (36.6 C)  TempSrc:  Oral  Oral  SpO2:  98%  97%  Weight:   78 kg   Height:        Intake/Output Summary (Last 24 hours) at 11/05/2023 0923 Last data filed at 11/05/2023 0804 Gross per 24 hour  Intake 1301.3 ml  Output 1090 ml  Net 211.3 ml      11/05/2023    6:00 AM 11/04/2023    4:06 PM 11/03/2023    8:26 PM  Last 3 Weights  Weight (lbs) 172 lb 174 lb 9.7 oz 180 lb 5.4 oz  Weight (kg) 78.019 kg 79.2 kg 81.8 kg      Telemetry SB-SR in the 50s-60s - Personally Reviewed  ECG  SR HR 61, LBBB - Personally Reviewed  Physical Exam  GEN: No acute distress.   Neck: No JVD Cardiac: RRR, no murmurs, rubs, or gallops.  Respiratory: Clear to auscultation bilaterally. GI: Soft, nontender, non-distended  MS: No edema; No deformity. Neuro:  Nonfocal  Psych: Normal affect    Labs High Sensitivity Troponin:   Recent Labs  Lab 11/03/23 2044 11/03/23 2207 11/04/23 0017 11/04/23 0224  TROPONINIHS 112* 118* 127* 143*     Chemistry Recent Labs  Lab 11/03/23 2044 11/04/23 0017 11/04/23 0556 11/05/23 0306  NA 137  --  142 137  K 4.1  --  3.7 4.0  CL 105  --  107 104  CO2 24  --  23 21*  GLUCOSE 135*  --  102* 141*  BUN 23  --  19 21  CREATININE 1.18  --  0.94 1.25*  CALCIUM  9.9  --  9.4 9.5  MG  --  2.4  --   --   GFRNONAA >60  --  >60 58*  ANIONGAP 8  --  12 12    Lipids  Recent Labs  Lab 11/04/23 0556  CHOL 94  TRIG 130  HDL 25*  LDLCALC 43  CHOLHDL 3.8    Hematology Recent Labs  Lab 11/03/23 2044 11/04/23 0556 11/05/23 0306  WBC 4.7 4.9 6.7  RBC 4.73 4.66 4.78  HGB 13.7 13.8  14.0  HCT 40.2 39.7 40.1  MCV 85.0 85.2 83.9  MCH 29.0 29.6 29.3  MCHC 34.1 34.8 34.9  RDW 14.4 14.6 14.6  PLT 228 216 224   Thyroid  No results for input(s): TSH, FREET4 in the last 168 hours.  BNPNo results for input(s): BNP, PROBNP in the last 168 hours.  DDimer No results for input(s): DDIMER in the last 168 hours.   Radiology  CARDIAC CATHETERIZATION Result Date: 11/04/2023 Table formatting from the original result was not included. Images from the original result were not included.   Prox LAD lesion is 70% stenosed. Prox LAD to Mid LAD lesion is 100% stenosed with side branch in 2nd Diag.   1st Diag lesion is 80% stenosed.   2nd Mrg lesion is 100% stenosed.  Dist Cx lesion is 80% stenosed.   Prox RCA lesion is 60% stenosed.  Mid RCA-1 lesion is 90% stenosed.  Mid RCA-2 lesion is 60% stenosed.  Mid RCA to Dist RCA lesion is 50% stenosed.   RPDA lesion is 50% stenosed.   --------------- GRAFTS -----------------------   LIMA-LAD graft was visualized by angiography and is normal in caliber.  The graft exhibits no disease.   SVG graft was visualized by angiography and is normal in caliber.  The graft exhibits no disease.   Seq SVG- OM2-OM3 graft  was visualized by angiography and is normal in caliber.  The graft exhibits no disease.   SVG-rPDA graft was visualized by angiography and is normal in caliber.  The graft exhibits severe disease: Origin lesion is 100% stenosed.   --------------------------------------   LV end diastolic pressure is mildly elevated.   There is no aortic valve stenosis. Dominance: Right  POST-OPERATIVE DIAGNOSIS:  Heavily calcified RCA with tandem 70%, 99% and 70% lesions in the proximal to mid RCA with 50% distal stenosis at the bifurcation Known 100% LAD that shortly after small caliber D1 with now 100% occluded OM 2 with severely diseased OM 3 Culprit Lesion: Ostial SVG-RPDA occlusion Widely patent SVG-D1, Seq SVG-OM2-OM3, and LIMA-LAD. Normal LVEDP PLAN OF CARE: Patient will go to the postprocedural unit and potentially after nursing unit for post-cath care.  Will restart IV heparin  2 hours after TR band removal.  Continue IV nitroglycerin  for blood pressure management and keep pain-free.  The plan will be to arrange for transfer to [facility] for staged atherectomy PCI of the RCA.  Will load with oral Plavix  200 mg today and start 75 mg tomorrow. Currently discussing plans with our team at [facility] to determine potential timing of transfer. Alm MICAEL Clay, MD, MS Alm Clay, M.D., M.S. Interventional Cardiologist [facility] Pager # [phone]   DG Chest 1 View Result Date: 11/03/2023 CLINICAL DATA:  Chest pain for weeks, worse tonight. EXAM: CHEST  1 VIEW COMPARISON:  02/28/2020 FINDINGS: Postoperative changes in the mediastinum. Heart size and pulmonary vascularity are normal. Lungs are clear. No pleural effusion or pneumothorax. Mediastinal contours appear intact. Calcification of the aorta. IMPRESSION: No active disease. Electronically Signed   By: Elsie Gravely M.D.   On: 11/03/2023 20:37    Cardiac Studies  LHC 11/04/23:   Prox LAD lesion is 70% stenosed. Prox LAD to Mid LAD lesion is  100% stenosed with side branch in 2nd Diag.   1st Diag lesion is 80% stenosed.   2nd Mrg lesion is 100% stenosed.  Dist Cx lesion is 80% stenosed.   Prox RCA lesion is 60% stenosed.  Mid RCA-1 lesion is 90%  stenosed.  Mid RCA-2 lesion is 60% stenosed.  Mid RCA to Dist RCA lesion is 50% stenosed.   RPDA lesion is 50% stenosed.   --------------- GRAFTS -----------------------   LIMA-LAD graft was visualized by angiography and is normal in caliber.  The graft exhibits no disease.   SVG graft was visualized by angiography and is normal in caliber.  The graft exhibits no disease.   Seq SVG- OM2-OM3 graft was visualized by angiography and is normal in caliber.  The graft exhibits no disease.   SVG-rPDA graft was visualized by angiography and is normal in caliber.  The graft exhibits severe disease: Origin lesion is 100% stenosed.   --------------------------------------   LV end diastolic pressure is mildly elevated.   There is no aortic valve stenosis.   Dominance: Right      POST-OPERATIVE DIAGNOSIS:   Heavily calcified RCA with tandem 70%, 99% and 70% lesions in the proximal to mid RCA with 50% distal stenosis at the bifurcation Known 100% LAD that shortly after small caliber D1 with now 100% occluded OM 2 with severely diseased OM 3 Culprit Lesion: Ostial SVG-RPDA occlusion Widely patent SVG-D1, Seq SVG-OM2-OM3, and LIMA-LAD. Normal LVEDP   PLAN OF CARE: Patient will go to the postprocedural unit and potentially after nursing unit for post-cath care.  Will restart IV heparin  2 hours after TR band removal.  Continue IV nitroglycerin  for blood pressure management and keep pain-free.  The plan will be to arrange for transfer to [facility] for staged atherectomy PCI of the RCA.  Will load with oral Plavix  200 mg today and start 75 mg tomorrow. Currently discussing plans with our team at [place] to determine potential timing of transfer.   Patient Profile   83 y.o. male with CAD  status post 5 vessel CABG in 01/2020 with LIMA to LAD, SVG to diagonal, sequential SVG to OM1 and OM 2, and SVG to PDA, postoperative atrial fibrillation, DM2, HTN, HLD, and carotid artery stenosis who is being seen 11/04/2023 for the evaluation of NSTEMI.   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      For questions or updates, please contact [facility] Please consult [email or web address] for contact info under     Signed, Jon Nat Hails, PA  11/05/2023, 9:23 AM     ATTENDING ATTESTATION:  After conducting a review of all available clinical information with the care team, interviewing the patient, and performing a physical exam, I agree with the findings and plan described in this note.   GEN: No acute distress, AO x 3 HEENT:  MMM, no JVD, no scleral icterus Cardiac: RRR, no murmurs, rubs, or gallops.  Respiratory: Clear to auscultation bilaterally. GI: Soft, nontender, non-distended  MS: No edema; No deformity. Neuro:  Nonfocal  Vasc:  +2 radial pulses  No acute events overnight.  The patient remains chest pain-free.  I reviewed his angiographic images.  This demonstrates patent LIMA to LAD, patent vein graft to diagonal, and patent sequential vein graft to OM1 and OM 2.  The vein graft to PDA is occluded.  The native right  coronary artery has tandem lesions and is heavily calcified.  He is scheduled for planned PCI with atherectomy later today.  Continue aspirin  81, Plavix  75, and atorvastatin  80.  Right radial site is appropriate for access.  Anticipate discharge tomorrow if no complications with procedure.  Patient seems to be euvolemic on exam.  Continue Farxiga  10, irbesartan  150, and Coreg  12.5 twice daily for moderate LV dysfunction.  I have reviewed the risks, indications, and alternatives to cardiac catheterization, possible angioplasty, and stenting with the patient. Risks include but are not limited to bleeding, infection, vascular injury, stroke, myocardial infection, arrhythmia, kidney injury, radiation-related injury in the case of prolonged fluoroscopy use, emergency cardiac surgery, and death. The patient understands the risks of serious complication is 1-2 in 1000 with diagnostic cardiac cath and 1-2% or less with angioplasty/stenting.    Lurena Red, MD Pager [phone]

## 2023-11-05 NOTE — Interval H&P Note (Signed)
 History and Physical Interval Note:  11/05/2023 12:38 PM  Luis Wise  has presented today for surgery, with the diagnosis of cad.  The various methods of treatment have been discussed with the patient and family. After consideration of risks, benefits and other options for treatment, the patient has consented to  Procedure(s): CORONARY STENT INTERVENTION (N/A) as a surgical intervention.  The patient's history has been reviewed, patient examined, no change in status, stable for surgery.  I have reviewed the patient's chart and labs.  Questions were answered to the patient's satisfaction.     Ozell Fell

## 2023-11-05 NOTE — H&P (View-Only) (Signed)
 Rounding Note   Patient Name: Luis Wise Date of Encounter: 11/05/2023  [facility] Cardiologist: Evalene Lunger, MD   Subjective Resting flat in bed, no chest pain. Ready for cath today.  Scheduled Meds:  amLODipine   10 mg Oral Daily   aspirin  EC  81 mg Oral Daily   atorvastatin   80 mg Oral Daily   carvedilol   12.5 mg Oral BID WC   cholecalciferol   1,000 Units Oral QHS   clopidogrel   75 mg Oral Q breakfast   vitamin B-12  1,000 mcg Oral Daily   dapagliflozin  propanediol  10 mg Oral Daily   fenofibrate   160 mg Oral Daily   insulin  aspart  0-15 Units Subcutaneous TID WC   insulin  aspart  0-5 Units Subcutaneous QHS   irbesartan   150 mg Oral Daily   multivitamin  1 tablet Oral Daily   traZODone   150 mg Oral QHS   Continuous Infusions:  sodium chloride      heparin  1,300 Units/hr (11/05/23 0636)   nitroGLYCERIN  25 mcg/min (11/05/23 0807)   PRN Meds: acetaminophen , nitroGLYCERIN , ondansetron  (ZOFRAN ) IV   Vital Signs  Vitals:   11/05/23 0300 11/05/23 0400 11/05/23 0600 11/05/23 0804  BP: (!) 160/80 134/68  (!) 161/75  Pulse:    63  Resp:  20  18  Temp:  98.1 F (36.7 C)  97.9 F (36.6 C)  TempSrc:  Oral  Oral  SpO2:  98%  97%  Weight:   78 kg   Height:        Intake/Output Summary (Last 24 hours) at 11/05/2023 0923 Last data filed at 11/05/2023 0804 Gross per 24 hour  Intake 1301.3 ml  Output 1090 ml  Net 211.3 ml      11/05/2023    6:00 AM 11/04/2023    4:06 PM 11/03/2023    8:26 PM  Last 3 Weights  Weight (lbs) 172 lb 174 lb 9.7 oz 180 lb 5.4 oz  Weight (kg) 78.019 kg 79.2 kg 81.8 kg      Telemetry SB-SR in the 50s-60s - Personally Reviewed  ECG  SR HR 61, LBBB - Personally Reviewed  Physical Exam  GEN: No acute distress.   Neck: No JVD Cardiac: RRR, no murmurs, rubs, or gallops.  Respiratory: Clear to auscultation bilaterally. GI: Soft, nontender, non-distended  MS: No edema; No deformity. Neuro:  Nonfocal  Psych: Normal affect    Labs High Sensitivity Troponin:   Recent Labs  Lab 11/03/23 2044 11/03/23 2207 11/04/23 0017 11/04/23 0224  TROPONINIHS 112* 118* 127* 143*     Chemistry Recent Labs  Lab 11/03/23 2044 11/04/23 0017 11/04/23 0556 11/05/23 0306  NA 137  --  142 137  K 4.1  --  3.7 4.0  CL 105  --  107 104  CO2 24  --  23 21*  GLUCOSE 135*  --  102* 141*  BUN 23  --  19 21  CREATININE 1.18  --  0.94 1.25*  CALCIUM  9.9  --  9.4 9.5  MG  --  2.4  --   --   GFRNONAA >60  --  >60 58*  ANIONGAP 8  --  12 12    Lipids  Recent Labs  Lab 11/04/23 0556  CHOL 94  TRIG 130  HDL 25*  LDLCALC 43  CHOLHDL 3.8    Hematology Recent Labs  Lab 11/03/23 2044 11/04/23 0556 11/05/23 0306  WBC 4.7 4.9 6.7  RBC 4.73 4.66 4.78  HGB 13.7 13.8  14.0  HCT 40.2 39.7 40.1  MCV 85.0 85.2 83.9  MCH 29.0 29.6 29.3  MCHC 34.1 34.8 34.9  RDW 14.4 14.6 14.6  PLT 228 216 224   Thyroid  No results for input(s): TSH, FREET4 in the last 168 hours.  BNPNo results for input(s): BNP, PROBNP in the last 168 hours.  DDimer No results for input(s): DDIMER in the last 168 hours.   Radiology  CARDIAC CATHETERIZATION Result Date: 11/04/2023 Table formatting from the original result was not included. Images from the original result were not included.   Prox LAD lesion is 70% stenosed. Prox LAD to Mid LAD lesion is 100% stenosed with side branch in 2nd Diag.   1st Diag lesion is 80% stenosed.   2nd Mrg lesion is 100% stenosed.  Dist Cx lesion is 80% stenosed.   Prox RCA lesion is 60% stenosed.  Mid RCA-1 lesion is 90% stenosed.  Mid RCA-2 lesion is 60% stenosed.  Mid RCA to Dist RCA lesion is 50% stenosed.   RPDA lesion is 50% stenosed.   --------------- GRAFTS -----------------------   LIMA-LAD graft was visualized by angiography and is normal in caliber.  The graft exhibits no disease.   SVG graft was visualized by angiography and is normal in caliber.  The graft exhibits no disease.   Seq SVG- OM2-OM3 graft  was visualized by angiography and is normal in caliber.  The graft exhibits no disease.   SVG-rPDA graft was visualized by angiography and is normal in caliber.  The graft exhibits severe disease: Origin lesion is 100% stenosed.   --------------------------------------   LV end diastolic pressure is mildly elevated.   There is no aortic valve stenosis. Dominance: Right  POST-OPERATIVE DIAGNOSIS:  Heavily calcified RCA with tandem 70%, 99% and 70% lesions in the proximal to mid RCA with 50% distal stenosis at the bifurcation Known 100% LAD that shortly after small caliber D1 with now 100% occluded OM 2 with severely diseased OM 3 Culprit Lesion: Ostial SVG-RPDA occlusion Widely patent SVG-D1, Seq SVG-OM2-OM3, and LIMA-LAD. Normal LVEDP PLAN OF CARE: Patient will go to the postprocedural unit and potentially after nursing unit for post-cath care.  Will restart IV heparin  2 hours after TR band removal.  Continue IV nitroglycerin  for blood pressure management and keep pain-free.  The plan will be to arrange for transfer to [facility] for staged atherectomy PCI of the RCA.  Will load with oral Plavix  200 mg today and start 75 mg tomorrow. Currently discussing plans with our team at [facility] to determine potential timing of transfer. Alm MICAEL Clay, MD, MS Alm Clay, M.D., M.S. Interventional Cardiologist [facility] Pager # [phone]   DG Chest 1 View Result Date: 11/03/2023 CLINICAL DATA:  Chest pain for weeks, worse tonight. EXAM: CHEST  1 VIEW COMPARISON:  02/28/2020 FINDINGS: Postoperative changes in the mediastinum. Heart size and pulmonary vascularity are normal. Lungs are clear. No pleural effusion or pneumothorax. Mediastinal contours appear intact. Calcification of the aorta. IMPRESSION: No active disease. Electronically Signed   By: Elsie Gravely M.D.   On: 11/03/2023 20:37    Cardiac Studies  LHC 11/04/23:   Prox LAD lesion is 70% stenosed. Prox LAD to Mid LAD lesion is  100% stenosed with side branch in 2nd Diag.   1st Diag lesion is 80% stenosed.   2nd Mrg lesion is 100% stenosed.  Dist Cx lesion is 80% stenosed.   Prox RCA lesion is 60% stenosed.  Mid RCA-1 lesion is 90%  stenosed.  Mid RCA-2 lesion is 60% stenosed.  Mid RCA to Dist RCA lesion is 50% stenosed.   RPDA lesion is 50% stenosed.   --------------- GRAFTS -----------------------   LIMA-LAD graft was visualized by angiography and is normal in caliber.  The graft exhibits no disease.   SVG graft was visualized by angiography and is normal in caliber.  The graft exhibits no disease.   Seq SVG- OM2-OM3 graft was visualized by angiography and is normal in caliber.  The graft exhibits no disease.   SVG-rPDA graft was visualized by angiography and is normal in caliber.  The graft exhibits severe disease: Origin lesion is 100% stenosed.   --------------------------------------   LV end diastolic pressure is mildly elevated.   There is no aortic valve stenosis.   Dominance: Right      POST-OPERATIVE DIAGNOSIS:   Heavily calcified RCA with tandem 70%, 99% and 70% lesions in the proximal to mid RCA with 50% distal stenosis at the bifurcation Known 100% LAD that shortly after small caliber D1 with now 100% occluded OM 2 with severely diseased OM 3 Culprit Lesion: Ostial SVG-RPDA occlusion Widely patent SVG-D1, Seq SVG-OM2-OM3, and LIMA-LAD. Normal LVEDP   PLAN OF CARE: Patient will go to the postprocedural unit and potentially after nursing unit for post-cath care.  Will restart IV heparin  2 hours after TR band removal.  Continue IV nitroglycerin  for blood pressure management and keep pain-free.  The plan will be to arrange for transfer to [facility] for staged atherectomy PCI of the RCA.  Will load with oral Plavix  200 mg today and start 75 mg tomorrow. Currently discussing plans with our team at [facility] to determine potential timing of transfer.   Patient Profile   83 y.o. male with CAD  status post 5 vessel CABG in 01/2020 with LIMA to LAD, SVG to diagonal, sequential SVG to OM1 and OM 2, and SVG to PDA, postoperative atrial fibrillation, DM2, HTN, HLD, and carotid artery stenosis who is being seen 11/04/2023 for the evaluation of NSTEMI.   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      For questions or updates, please contact [facility] Please consult [email or web address] for contact info under     Signed, Jon Nat Hails, PA  11/05/2023, 9:23 AM     ATTENDING ATTESTATION:  After conducting a review of all available clinical information with the care team, interviewing the patient, and performing a physical exam, I agree with the findings and plan described in this note.   GEN: No acute distress, AO x 3 HEENT:  MMM, no JVD, no scleral icterus Cardiac: RRR, no murmurs, rubs, or gallops.  Respiratory: Clear to auscultation bilaterally. GI: Soft, nontender, non-distended  MS: No edema; No deformity. Neuro:  Nonfocal  Vasc:  +2 radial pulses  No acute events overnight.  The patient remains chest pain-free.  I reviewed his angiographic images.  This demonstrates patent LIMA to LAD, patent vein graft to diagonal, and patent sequential vein graft to OM1 and OM 2.  The vein graft to PDA is occluded.  The native right  coronary artery has tandem lesions and is heavily calcified.  He is scheduled for planned PCI with atherectomy later today.  Continue aspirin  81, Plavix  75, and atorvastatin  80.  Right radial site is appropriate for access.  Anticipate discharge tomorrow if no complications with procedure.  Patient seems to be euvolemic on exam.  Continue Farxiga  10, irbesartan  150, and Coreg  12.5 twice daily for moderate LV dysfunction.  I have reviewed the risks, indications, and alternatives to cardiac catheterization, possible angioplasty, and stenting with the patient. Risks include but are not limited to bleeding, infection, vascular injury, stroke, myocardial infection, arrhythmia, kidney injury, radiation-related injury in the case of prolonged fluoroscopy use, emergency cardiac surgery, and death. The patient understands the risks of serious complication is 1-2 in 1000 with diagnostic cardiac cath and 1-2% or less with angioplasty/stenting.    Lurena Red, MD Pager [phone]

## 2023-11-05 NOTE — Progress Notes (Signed)
 PHARMACY - ANTICOAGULATION CONSULT NOTE  Pharmacy Consult for heparin  Indication: CAD awaiting atherectomy PCI  Labs: Recent Labs    11/03/23 2044 11/03/23 2120 11/03/23 2207 11/04/23 0017 11/04/23 0224 11/04/23 0556 11/05/23 0306  HGB 13.7  --   --   --   --  13.8 14.0  HCT 40.2  --   --   --   --  39.7 40.1  PLT 228  --   --   --   --  216 224  APTT  --  26  --   --   --   --   --   LABPROT  --   --   --   --   --  14.5  --   INR  --   --   --   --   --  1.1  --   HEPARINUNFRC  --  <0.10*  --   --   --  0.27* 0.23*  CREATININE 1.18  --   --   --   --  0.94 1.25*  TROPONINIHS 112*  --  118* 127* 143*  --   --    Assessment: 82yo male subtherapeutic on heparin  after resuming post cath, now tx'd to [facility] for possible atherectomy PCI; no infusion issues or signs of bleeding per RN.  Goal of Therapy:  Heparin  level 0.3-0.7 units/ml   Plan:  Increase heparin  infusion by 1-2 units/kg/hr to 1300 units/hr. Check level in 8 hours.   Marvetta Dauphin, PharmD, BCPS 11/05/2023 4:06 AM

## 2023-11-05 NOTE — Inpatient Diabetes Management (Addendum)
 Inpatient Diabetes Program Recommendations  AACE/ADA: New Consensus Statement on Inpatient Glycemic Control (2015)  Target Ranges:  Prepandial:   less than 140 mg/dL      Peak postprandial:   less than 180 mg/dL (1-2 hours)      Critically ill patients:  140 - 180 mg/dL   Lab Results  Component Value Date   GLUCAP 153 (H) 11/05/2023   HGBA1C 5.8 (A) 10/14/2023    Review of Glycemic Control  Diabetes history: DM2 Outpatient Diabetes medications: T slim insulin  pump,(per patient) Dexcom G7, Metformin  1500 mg daily, Farxiga  10 mg daily Current orders for Inpatient glycemic control: Novolog  0-15 units correction scale TID, Novolog  0-5 units HS scale, Farxiga  10 mg daily  Inpatient Diabetes Program Recommendations:   Received diabetes coordinator consult for insulin  pump.  Noted that Dr. Tawni Lands charted insulin  pump settings changes: (from 10/14/23) using Humalog  U-200/ml insulin  in pump:   Basal rates: 12 am: 1.8 6 am: 2.0 >> 1.8                                     5 pm: 1.7 - Insulin  to carb ratio: 12 am: 1:8 6 am: 1:7 >> 1:6.5 12 pm: 1:6 5 pm: 1:9 - Target: 12 am: 110 - Correction factor (insulin  sensitivity factor):  12 am: 70 >> 80  Total basal: 42.5 units/24 hours per current settings as above.  ADDENDUM: spoke to patient at the bedside. Patient confirms that he uses a T Slim insulin  pump at home. He is not wearing the pump at this time. Wife states that she took pump and supplies home with her. Noted that blood sugars are trending within normal limits at this time on current insulin  orders.  Patient has been on t-slim insulin  pump for about 14 months. Sees Dr. Tawni Lands as endocrinologist.   Will continue to monitor blood sugars while in the hospital.  Marjorie Lunger RN BSN CDE Diabetes Coordinator Pager: [phone]  8am-5pm

## 2023-11-05 NOTE — Progress Notes (Signed)
 Called to see patient for hematoma at cath site, small area of enlargement proximal to TR band. Per nurse, slow deflation reported due to slight capillary oozing which has resolved, then hematoma formation. Patient reports only minimal discomfort, feeling great otherwise, VSS, neurovascularly intact with good cap refill. Area has been marked to track enlargement. Photo taken under Media. Discussed and reviewed with Dr. Wendel who recommends continued conservative approach, continue TR band protocol as per usual, monitor for any further enlargement, worsening pain, or neurovascular changes which would warrant US  to exclude pseudoaneurysm. Patient also reminded of arm precautions as well.

## 2023-11-05 NOTE — Plan of Care (Signed)

## 2023-11-05 NOTE — Discharge Instructions (Signed)

## 2023-11-06 ENCOUNTER — Encounter (HOSPITAL_COMMUNITY): Payer: Self-pay | Admitting: Cardiovascular Disease

## 2023-11-06 ENCOUNTER — Other Ambulatory Visit (HOSPITAL_COMMUNITY): Payer: Self-pay

## 2023-11-06 ENCOUNTER — Telehealth: Payer: Self-pay | Admitting: Home Health

## 2023-11-06 ENCOUNTER — Inpatient Hospital Stay (HOSPITAL_COMMUNITY)

## 2023-11-06 DIAGNOSIS — I214 Non-ST elevation (NSTEMI) myocardial infarction: Secondary | ICD-10-CM

## 2023-11-06 LAB — BASIC METABOLIC PANEL WITH GFR
Anion gap: 9 (ref 5–15)
BUN: 20 mg/dL (ref 8–23)
CO2: 22 mmol/L (ref 22–32)
Calcium: 8.7 mg/dL — ABNORMAL LOW (ref 8.9–10.3)
Chloride: 105 mmol/L (ref 98–111)
Creatinine, Ser: 1.04 mg/dL (ref 0.61–1.24)
GFR, Estimated: >60 mL/min (ref >60)
Glucose, Bld: 123 mg/dL — ABNORMAL HIGH (ref 70–99)
Potassium: 3.6 mmol/L (ref 3.5–5.1)
Sodium: 136 mmol/L (ref 135–145)

## 2023-11-06 LAB — LIPOPROTEIN A (LPA): Lipoprotein (a): 53.9 nmol/L — ABNORMAL HIGH (ref <75.0)

## 2023-11-06 LAB — CBC
HCT: 39.1 % (ref 39.0–52.0)
Hemoglobin: 13.4 g/dL (ref 13.0–17.0)
MCH: 28.9 pg (ref 26.0–34.0)
MCHC: 34.3 g/dL (ref 30.0–36.0)
MCV: 84.4 fL (ref 80.0–100.0)
Platelets: 197 K/uL (ref 150–400)
RBC: 4.63 MIL/uL (ref 4.22–5.81)
RDW: 14.4 % (ref 11.5–15.5)
WBC: 6.3 K/uL (ref 4.0–10.5)
nRBC: 0 % (ref 0.0–0.2)

## 2023-11-06 LAB — ECHOCARDIOGRAM COMPLETE
Area-P 1/2: 1.88 cm2
Height: 69 in
S' Lateral: 3 cm
Weight: 2752 [oz_av]

## 2023-11-06 LAB — GLUCOSE, CAPILLARY
Glucose-Capillary: 99 mg/dL (ref 70–99)
Glucose-Capillary: 123 mg/dL — ABNORMAL HIGH (ref 70–99)

## 2023-11-06 LAB — HEMOGLOBIN A1C
Hgb A1c MFr Bld: 5.6 % (ref 4.8–5.6)
Mean Plasma Glucose: 114.02 mg/dL

## 2023-11-06 MED ORDER — SPIRONOLACTONE 25 MG PO TABS
25.0000 mg | ORAL_TABLET | Freq: Every day | ORAL | 2 refills | Status: DC
Start: 2023-11-06 — End: 2023-12-22
  Filled 2023-11-06: qty 30, 30d supply, fill #0

## 2023-11-06 MED ORDER — CLOPIDOGREL BISULFATE 75 MG PO TABS
75.0000 mg | ORAL_TABLET | Freq: Every day | ORAL | 3 refills | Status: AC
  Filled 2023-11-06: qty 90, 90d supply, fill #0

## 2023-11-06 MED ORDER — SPIRONOLACTONE 25 MG PO TABS
25.0000 mg | ORAL_TABLET | Freq: Every day | ORAL | Status: DC
  Administered 2023-11-06: 25 mg via ORAL
  Filled 2023-11-06: qty 1

## 2023-11-06 NOTE — Progress Notes (Signed)
 Echocardiogram 2D Echocardiogram has been performed.  Damien FALCON Galya Dunnigan RDCS 11/06/2023, 8:27 AM

## 2023-11-06 NOTE — Progress Notes (Signed)
   Rounding Note    Patient Name: Luis Wise Date of Encounter: 11/06/2023  [facility] Cardiologist: Evalene Lunger, MD   Subjective   No acute vents or night.  Doing well after PCI yesterday.  Vital Signs    Vitals:   11/05/23 1653 11/05/23 1957 11/05/23 2338 11/06/23 0411  BP: (!) 147/66 (!) 144/79 (!) 158/69 (!) 158/82  Pulse: 61 (!) 59 75 71  Resp: 16 18 16 18   Temp: 98.1 F (36.7 C) 98.4 F (36.9 C) 98.1 F (36.7 C) 97.8 F (36.6 C)  TempSrc: Oral Oral Oral Oral  SpO2: 94% 93% 97% 95%  Weight:      Height:        Intake/Output Summary (Last 24 hours) at 11/06/2023 0914 Last data filed at 11/06/2023 0031 Gross per 24 hour  Intake 9853.53 ml  Output 1350 ml  Net 8503.53 ml      11/05/2023    6:00 AM 11/04/2023    4:06 PM 11/03/2023    8:26 PM  Last 3 Weights  Weight (lbs) 172 lb 174 lb 9.7 oz 180 lb 5.4 oz  Weight (kg) 78.019 kg 79.2 kg 81.8 kg      Telemetry    Personally Reviewed  ECG    Personally Reviewed  Physical Exam   GEN: No acute distress.   Cardiac: RRR, no murmurs, rubs, or gallops.  Upper extremity access sites without significant hematoma.  Some scattered ecchymoses.  Distal sensation intact. Respiratory: Clear to auscultation bilaterally. Psych: Normal affect   Assessment & Plan    #NSTEMI #Coronary artery disease Continue aspirin /Plavix   Okay to discharge.    Ole T. Cindie, MD, [facility], [facility] Cardiac Electrophysiology

## 2023-11-06 NOTE — Discharge Summary (Signed)
 Discharge Summary   Patient ID: Luis Wise MRN: [id number]; DOB: 05-15-1940  Admit date: 11/04/2023 Discharge date: 11/06/2023  PCP:  Cleatus Arlyss RAMAN, MD   [facility] Providers Cardiologist:  Evalene Lunger, MD     Discharge Diagnoses  Principal Problem:   NSTEMI (non-ST elevated myocardial infarction) [facility])   Diagnostic Studies/Procedures   Echo from today:  1. Left ventricular ejection fraction, by estimation, is 60 to 65%. The  left ventricle has normal function. The left ventricle has no regional  wall motion abnormalities. There is mild concentric left ventricular  hypertrophy. Left ventricular diastolic  parameters are consistent with Grade I diastolic dysfunction (impaired  relaxation). Abnormal (paradoxical) septal motion consistent with  post-operative status.   2. Right ventricular systolic function is normal. The right ventricular  size is normal. Tricuspid regurgitation signal is inadequate for assessing  PA pressure.   3. The mitral valve is normal in structure. No evidence of mitral valve  regurgitation. No evidence of mitral stenosis.   4. The aortic valve has an indeterminant number of cusps. Aortic valve  regurgitation is not visualized. Aortic valve sclerosis is present, with  no evidence of aortic valve stenosis.   5. The inferior vena cava is normal in size with greater than 50%  respiratory variability, suggesting right atrial pressure of 3 mmHg.   Comparison(s): Prior images reviewed side by side. Aortic regurgitation is  not seen in this study. Stroke volume index has increased in this study.     Staged PCI 11/05/2023:   Diagnostic Dominance: Right  Intervention    Successful CSI orbital atherectomy and stenting of severe stenoses throughout the mid and distal RCA and the ostium of the PDA.   Ostial PDA lesion 80% baseline, 0% residual stenosis after a 3.0 x 12 mm resolute Onyx DES   Distal RCA lesion 80% baseline, 0%  residual after a 3.5 x 26 mm resolute Onyx DES   Mid RCA lesion 99% severe hypodense calcium , 0% residual stenosis after a 3.5 x 34 mm resolute Onyx DES   Recommend: DAPT with aspirin  and clopidogrel  minimum 12 months consider long-term P2 Y12 inhibition.  Hospital discharge tomorrow if no complications arise.    LHC  11/04/23:    Prox LAD lesion is 70% stenosed. Prox LAD to Mid LAD lesion is 100% stenosed with side branch in 2nd Diag.   1st Diag lesion is 80% stenosed.   2nd Mrg lesion is 100% stenosed.  Dist Cx lesion is 80% stenosed.   Prox RCA lesion is 60% stenosed.  Mid RCA-1 lesion is 90% stenosed.  Mid RCA-2 lesion is 60% stenosed.  Mid RCA to Dist RCA lesion is 50% stenosed.   RPDA lesion is 50% stenosed.   --------------- GRAFTS -----------------------   LIMA-LAD graft was visualized by angiography and is normal in caliber.  The graft exhibits no disease.   SVG graft was visualized by angiography and is normal in caliber.  The graft exhibits no disease.   Seq SVG- OM2-OM3 graft was visualized by angiography and is normal in caliber.  The graft exhibits no disease.   SVG-rPDA graft was visualized by angiography and is normal in caliber.  The graft exhibits severe disease: Origin lesion is 100% stenosed.   --------------------------------------   LV end diastolic pressure is mildly elevated.   There is no aortic valve stenosis.   Dominance: Right      POST-OPERATIVE DIAGNOSIS:   Heavily calcified RCA with tandem 70%, 99% and 70% lesions  in the proximal to mid RCA with 50% distal stenosis at the bifurcation Known 100% LAD that shortly after small caliber D1 with now 100% occluded OM 2 with severely diseased OM 3 Culprit Lesion: Ostial SVG-RPDA occlusion Widely patent SVG-D1, Seq SVG-OM2-OM3, and LIMA-LAD. Normal LVEDP   PLAN OF CARE: Patient will go to the postprocedural unit and potentially after nursing unit for post-cath care.  Will restart IV heparin  2 hours after TR band  removal.  Continue IV nitroglycerin  for blood pressure management and keep pain-free.  The plan will be to arrange for transfer to [facility] for staged atherectomy PCI of the RCA.  Will load with oral Plavix  200 mg today and start 75 mg tomorrow. Currently discussing plans with our team at [facility] to determine potential timing of transfer.  _____________   History of Present Illness    Per admission H&P by Abigail Motto, PA-C on 11/04/2023:  Luis Wise is a 83 y.o. male with CAD status post 5 vessel CABG in 01/2020 with LIMA to LAD, SVG to diagonal, sequential SVG to OM1 and OM 2, and SVG to PDA, postoperative atrial fibrillation, DM2, HTN, HLD, and carotid artery stenosis who was seen on 11/04/2023 for the evaluation of NSTEMI.   Luis Wise was initially evaluated by Dr. Gollan in 12/2019 for chest pain following the ED visit.  He was noted to be markedly hypertensive with systolic blood pressure in the 190s mmHg and having LBBB.  Initial troponin 47 with a delta troponin 43.  He was started on Imdur  and underwent coronary CTA which showed a calcium  score of 5706 with heavily calcified coronary arteries causing significant/severe stenosis of greater than 70% in the mid LAD, mid RCA, and ostial OM1.  FFR was significant in the mid LAD.  Subsequent LHC showed severe heavily calcified multivessel CAD with CTO of the proximal to mid LAD with bridging collaterals and faint right to left collaterals.  He was referred to CVTS and underwent 5 vessel CABG on 01/30/2020.  Preoperative echo showed an EF of 55 to 60% with mild hypokinesis of the apical septal wall, mild concentric LVH, grade 1 diastolic dysfunction, normal RV systolic function and ventricular cavity size, and no significant valvular abnormalities.  Postoperative course was notable for postoperative A-fib treated with amiodarone  and subsequent conversion to sinus rhythm with redevelopment of A-fib briefly thereafter.  He has not been  maintained on OAC.  Most recent echo from 11/2021 showed an EF of 45 to 50% with anteroseptal wall hypokinesis, mild LVH, grade 1 diastolic dysfunction, normal RV systolic function and ventricular cavity size, mild mitral regurgitation, mild aortic insufficiency, aortic valve sclerosis without evidence of stenosis, and an estimated right atrial pressure of 18 mmHg.  He was seen in the office on 11/01/2023 with a several day history of episodic exertional chest discomfort during his typical walk that would improve with rest concerning for accelerating angina.  Pain was rated a 3/10.  He was without symptoms of angina at time of office visit.  He was scheduled for LHC on 11/04/2023.   He called our office on 11/03/2023 reporting increasing angina,  now occurring at rest and waking him from sleep.  He had taken 9 nitroglycerin  since 8/25 with recommendation to proceed to the ED.  He was subsequently admitted on 11/03/2023 with unstable angina.  EKG showed NSR, 67 bpm, known LBBB.  High sensitivity troponin 112 with a delta troponin 118 currently trended to 143.  Chest x-ray without  active disease.  BP has ranged from the 120s to 160s mmHg systolic.  In the ER he was given aspirin  324 mg x 1 and placed on nitro and heparin  drips.  At time of cardiology consult he was without symptoms of angina or cardiac decompensation.  He underwent LHC on 11/04/2023 that showed significant underlying native vessel CAD with flush occlusion of SVG to RCA and recommendation for patient to be transferred to [facility] for consideration of potential atherectomy PCI on 11/05/2023.    Hospital Course   Consultants: N/A    NSTEMI CAD s/p CABG 2021 (LIMA-LAD, SVG-Diag, SVG-OM1-OM2, SVG-PDA)  - presented to the office 11/01/23 with exertional chest pain, arranged for [facility] 11/04/23, escalating angina leading to admission on 11/03/23, Hs trop 112>118>127>143;  LHC 11/04/23 showed widely patent SVG-D1, Seq SVG-OM2-OM3, and LIMA-LAD; Culprit Lesion  was Ostial SVG-RPDA occlusion with heavily calcified RCA with tandem 70%, 99% and 70% lesions in the proximal to mid RCA with 50% distal stenosis at the bifurcation; he was transferred to [facility] for staged atherectomy PCI of the RCA  - stage PCI 11/05/23: Successful CSI orbital atherectomy and stenting of severe stenoses throughout the mid and distal RCA and the ostium of the PDA, DES placed to Ostial PDA and distal RCA and mid RCA  - Echo today showed LVEF 60 to 65%, no regional wall motion abnormality, mild concentric LVH, grade 1 DD, abnormal septal motion consistent with postop status, normal RV, aortic sclerosis, IVC normal - LDL 43, Hgb A1C added today 5.6% - Post cath 11/05/23 he developed 3x3cm hematoma, which had resolved with TR band application - Post cath care discussed in detail via phone  - Medical therapy: s/p heparin  and nitroglycerin  gtt, continue DAPT with ASA and Plavix  for 12 months;  atorvastatin  80 mg, fenofibrate  160mg  daily, carvedilol  12.5 mg twice daily, amlodipine  10 mg, valsartan  160 mg, PRN nitroglycerin  (all new script sent to [facility] pharmacy today), STOP PTA Imdur   - Follow up had already been arranged on 11/25/23, will keep this appointment   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 - Euvolemic and well compensated  - Most recent echo from 11/2021 showed a reduction in LV systolic function with an EF of 45 to 50%, previously normal by echo in 2021  - LHC as above  - Echo repeat as above, EF has improved  - not on baseline loop diuretics  - GDMT: continued on PTA carvedilol  12.5 mg twice daily, valsartan  160 mg, and Farxiga  10 mg; will add spironolactone  25mg  daily given elevated BP now; consider transition irbesartan  to Entresto  at next follow up appointment if cost is not an issue; consider BMP at next visit   HTN - Blood pressure remains elevated - Currently on PTA amlodipine  10 mg, carvedilol  12.5 mg twice daily, and valsartan  160 mg; will add spironolactone  25mg  daily today  - advised  patient to bring BP log to next appointment  HLD - LDL 43  - LP(a) 53.9 - continue PTA atorvastatin  80 mg and fenofibrate  160 mg  Carotid artery stenosis - 1 to 39% bilateral ICA stenosis in 03/2023  - continue PTA Aspirin  and atorvastatin     Post op A fib (following CABG 2021) - no recurrence so far, historically not on anticoagulation, follow up with primary cardiologist   Type 2 DM - HgbA1C 5.6%  - advised the patient to resume metformin  on 11/09/23 (48hrs post cath) - continue farxiga  and home insulin  regimen, follow up with PCP as scheduled  Did the patient have an acute coronary syndrome (MI, NSTEMI, STEMI, etc) this admission?:  Yes                               AHA/ACC ACS Clinical Performance & Quality Measures: Aspirin  prescribed? - Yes ADP Receptor Inhibitor (Plavix /Clopidogrel , Brilinta/Ticagrelor or Effient/Prasugrel) prescribed (includes medically managed patients)? - Yes Beta Blocker prescribed? - Yes High Intensity Statin (Lipitor 40-80mg  or Crestor 20-40mg ) prescribed? - Yes EF assessed during THIS hospitalization? - Yes For EF <40%, was ACEI/ARB prescribed? - Yes For EF <40%, Aldosterone Antagonist (Spironolactone  or Eplerenone) prescribed? - Yes Cardiac Rehab Phase II ordered (including medically managed patients)? - Yes       The patient will be scheduled for a TOC follow up appointment in 18 days.  A message has been sent to the [facility] and Scheduling Pool at the office where the patient should be seen for follow up.  _____________  Discharge Vitals Blood pressure (!) 160/73, pulse 64, temperature 97.6 F (36.4 C), temperature source Oral, resp. rate 15, height 5' 9 (1.753 m), weight 78 kg, SpO2 95%.  Filed Weights   11/04/23 1606 11/05/23 0600  Weight: 79.2 kg 78 kg    See attending progress note today for physical exam    Labs & Radiologic Studies  CBC Recent Labs    11/05/23 0306 11/06/23 0441  WBC 6.7 6.3  HGB 14.0 13.4  HCT 40.1  39.1  MCV 83.9 84.4  PLT 224 197   Basic Metabolic Panel Recent Labs    91/71/74 0017 11/04/23 0556 11/05/23 0306 11/06/23 0441  NA  --    < > 137 136  K  --    < > 4.0 3.6  CL  --    < > 104 105  CO2  --    < > 21* 22  GLUCOSE  --    < > 141* 123*  BUN  --    < > 21 20  CREATININE  --    < > 1.25* 1.04  CALCIUM   --    < > 9.5 8.7*  MG 2.4  --   --   --   PHOS 3.2  --   --   --    < > = values in this interval not displayed.   Liver Function Tests No results for input(s): AST, ALT, ALKPHOS, BILITOT, PROT, ALBUMIN  in the last 72 hours. No results for input(s): LIPASE, AMYLASE in the last 72 hours. High Sensitivity Troponin:   Recent Labs  Lab 11/03/23 2044 11/03/23 2207 11/04/23 0017 11/04/23 0224  TROPONINIHS 112* 118* 127* 143*    No results for input(s): TRNPT in the last 720 hours.  BNP Invalid input(s): POCBNP No results for input(s): PROBNP in the last 72 hours.  No results for input(s): BNP in the last 72 hours.  D-Dimer No results for input(s): DDIMER in the last 72 hours. Hemoglobin A1C Recent Labs    11/06/23 0441  HGBA1C 5.6   Fasting Lipid Panel Recent Labs    11/04/23 0556  CHOL 94  HDL 25*  LDLCALC 43  TRIG 869  CHOLHDL 3.8   Lipoprotein (a)  Date/Time Value Ref Range Status  11/04/2023 05:56 AM 53.9 (H) <75.0 nmol/L Final    Comment:    (NOTE) This test was developed and its performance characteristics determined by [facility]. It has not been cleared or approved by the Food and  Drug Administration. Note:  Values greater than or equal to 75.0 nmol/L may       indicate an independent risk factor for CHD,       but must be evaluated with caution when applied       to non-Caucasian populations due to the       influence of genetic factors on Lp(a) across       ethnicities. Performed At: [facility] [address] [place], [place] [id number] Jennette Shorter MD Ey:[id number]     Thyroid  Function  Tests No results for input(s): TSH, T4TOTAL, T3FREE, THYROIDAB in the last 72 hours.  Invalid input(s): FREET3 _____________  ECHOCARDIOGRAM COMPLETE Result Date: 11/06/2023    ECHOCARDIOGRAM REPORT   Patient Name:   SAMANYU TINNELL Date of Exam: 11/06/2023 Medical Rec #:  [id number]     Height:       69.0 in Accession #:    [id number]    Weight:       172.0 lb Date of Birth:  January 23, 1941     BSA:          1.938 m Patient Age:    82 years      BP:           158/82 mmHg Patient Gender: M             HR:           69 bpm. Exam Location:  Inpatient Procedure: 2D Echo, Cardiac Doppler and Color Doppler (Both Spectral and Color            Flow Doppler were utilized during procedure). Indications:    NSTEMI i21.4  History:        Patient has prior history of Echocardiogram examinations, most                 recent 11/27/2021. CAD, Prior CABG, Arrythmias:Atrial                 Fibrillation; Risk Factors:Hypertension, Diabetes and                 Dyslipidemia.  Sonographer:    Damien Senior RDCS Referring Phys: [id number] MICHAEL COOPER IMPRESSIONS  1. Left ventricular ejection fraction, by estimation, is 60 to 65%. The left ventricle has normal function. The left ventricle has no regional wall motion abnormalities. There is mild concentric left ventricular hypertrophy. Left ventricular diastolic parameters are consistent with Grade I diastolic dysfunction (impaired relaxation). Abnormal (paradoxical) septal motion consistent with post-operative status.  2. Right ventricular systolic function is normal. The right ventricular size is normal. Tricuspid regurgitation signal is inadequate for assessing PA pressure.  3. The mitral valve is normal in structure. No evidence of mitral valve regurgitation. No evidence of mitral stenosis.  4. The aortic valve has an indeterminant number of cusps. Aortic valve regurgitation is not visualized. Aortic valve sclerosis is present, with no evidence of aortic valve stenosis.  5. The  inferior vena cava is normal in size with greater than 50% respiratory variability, suggesting right atrial pressure of 3 mmHg. Comparison(s): Prior images reviewed side by side. Aortic regurgitation is not seen in this study. Stroke volume index has increased in this study. FINDINGS  Left Ventricle: Left ventricular ejection fraction, by estimation, is 60 to 65%. The left ventricle has normal function. The left ventricle has no regional wall motion abnormalities. The left ventricular internal cavity size was normal in size. There is  mild concentric left ventricular hypertrophy. Left ventricular  diastolic parameters are consistent with Grade I diastolic dysfunction (impaired relaxation). Right Ventricle: The right ventricular size is normal. No increase in right ventricular wall thickness. Right ventricular systolic function is normal. Tricuspid regurgitation signal is inadequate for assessing PA pressure. Left Atrium: Left atrial size was normal in size. Right Atrium: Right atrial size was normal in size. Pericardium: There is no evidence of pericardial effusion. Mitral Valve: The mitral valve is normal in structure. No evidence of mitral valve regurgitation. No evidence of mitral valve stenosis. Tricuspid Valve: The tricuspid valve is normal in structure. Tricuspid valve regurgitation is trivial. No evidence of tricuspid stenosis. Aortic Valve: The aortic valve has an indeterminant number of cusps. Aortic valve regurgitation is not visualized. Aortic valve sclerosis is present, with no evidence of aortic valve stenosis. Pulmonic Valve: The pulmonic valve was grossly normal. Pulmonic valve regurgitation is not visualized. Aorta: The aortic root and ascending aorta are structurally normal, with no evidence of dilitation. Venous: The inferior vena cava is normal in size with greater than 50% respiratory variability, suggesting right atrial pressure of 3 mmHg. IAS/Shunts: No atrial level shunt detected by color flow  Doppler.  LEFT VENTRICLE PLAX 2D LVIDd:         4.60 cm   Diastology LVIDs:         3.00 cm   LV e' medial:    4.79 cm/s LV PW:         1.30 cm   LV E/e' medial:  13.0 LV IVS:        1.30 cm   LV e' lateral:   10.30 cm/s LVOT diam:     2.30 cm   LV E/e' lateral: 6.0 LV SV:         115 LV SV Index:   59 LVOT Area:     4.15 cm  RIGHT VENTRICLE RV S prime:     10.20 cm/s TAPSE (M-mode): 1.8 cm LEFT ATRIUM             Index        RIGHT ATRIUM           Index LA diam:        3.40 cm 1.75 cm/m   RA Area:     17.10 cm LA Vol (A2C):   44.3 ml 22.86 ml/m  RA Volume:   40.60 ml  20.95 ml/m LA Vol (A4C):   60.1 ml 31.02 ml/m LA Biplane Vol: 54.0 ml 27.87 ml/m  AORTIC VALVE LVOT Vmax:   125.00 cm/s LVOT Vmean:  101.000 cm/s LVOT VTI:    0.276 m  AORTA Ao Root diam: 3.20 cm Ao Asc diam:  3.30 cm MITRAL VALVE MV Area (PHT): 1.88 cm     SHUNTS MV Decel Time: 404 msec     Systemic VTI:  0.28 m MV E velocity: 62.20 cm/s   Systemic Diam: 2.30 cm MV A velocity: 103.00 cm/s MV E/A ratio:  0.60 Stanly Leavens MD Electronically signed by Stanly Leavens MD Signature Date/Time: 11/06/2023/10:20:26 AM    Final    CARDIAC CATHETERIZATION Result Date: 11/05/2023 Successful CSI orbital atherectomy and stenting of severe stenoses throughout the mid and distal RCA and the ostium of the PDA. Ostial PDA lesion 80% baseline, 0% residual stenosis after a 3.0 x 12 mm resolute Onyx DES Distal RCA lesion 80% baseline, 0% residual after a 3.5 x 26 mm resolute Onyx DES Mid RCA lesion 99% severe hypodense calcium , 0% residual stenosis after a 3.5 x 34  mm resolute Onyx DES Recommend: DAPT with aspirin  and clopidogrel  minimum 12 months consider long-term P2 Y12 inhibition.  Hospital discharge tomorrow if no complications arise.   CARDIAC CATHETERIZATION Result Date: 11/04/2023 Table formatting from the original result was not included. Images from the original result were not included.   Prox LAD lesion is 70% stenosed. Prox LAD to  Mid LAD lesion is 100% stenosed with side branch in 2nd Diag.   1st Diag lesion is 80% stenosed.   2nd Mrg lesion is 100% stenosed.  Dist Cx lesion is 80% stenosed.   Prox RCA lesion is 60% stenosed.  Mid RCA-1 lesion is 90% stenosed.  Mid RCA-2 lesion is 60% stenosed.  Mid RCA to Dist RCA lesion is 50% stenosed.   RPDA lesion is 50% stenosed.   --------------- GRAFTS -----------------------   LIMA-LAD graft was visualized by angiography and is normal in caliber.  The graft exhibits no disease.   SVG graft was visualized by angiography and is normal in caliber.  The graft exhibits no disease.   Seq SVG- OM2-OM3 graft was visualized by angiography and is normal in caliber.  The graft exhibits no disease.   SVG-rPDA graft was visualized by angiography and is normal in caliber.  The graft exhibits severe disease: Origin lesion is 100% stenosed.   --------------------------------------   LV end diastolic pressure is mildly elevated.   There is no aortic valve stenosis. Dominance: Right  POST-OPERATIVE DIAGNOSIS:  Heavily calcified RCA with tandem 70%, 99% and 70% lesions in the proximal to mid RCA with 50% distal stenosis at the bifurcation Known 100% LAD that shortly after small caliber D1 with now 100% occluded OM 2 with severely diseased OM 3 Culprit Lesion: Ostial SVG-RPDA occlusion Widely patent SVG-D1, Seq SVG-OM2-OM3, and LIMA-LAD. Normal LVEDP PLAN OF CARE: Patient will go to the postprocedural unit and potentially after nursing unit for post-cath care.  Will restart IV heparin  2 hours after TR band removal.  Continue IV nitroglycerin  for blood pressure management and keep pain-free.  The plan will be to arrange for transfer to [facility] for staged atherectomy PCI of the RCA.  Will load with oral Plavix  200 mg today and start 75 mg tomorrow. Currently discussing plans with our team at  to determine potential timing of transfer. Alm MICAEL Clay, MD, MS Alm Clay, M.D., M.S. Interventional  Cardiologist [facility] Pager # [phone]   DG Chest 1 View Result Date: 11/03/2023 CLINICAL DATA:  Chest pain for weeks, worse tonight. EXAM: CHEST  1 VIEW COMPARISON:  02/28/2020 FINDINGS: Postoperative changes in the mediastinum. Heart size and pulmonary vascularity are normal. Lungs are clear. No pleural effusion or pneumothorax. Mediastinal contours appear intact. Calcification of the aorta. IMPRESSION: No active disease. Electronically Signed   By: Elsie Gravely M.D.   On: 11/03/2023 20:37    Disposition Patient seen by Dr. Cindie today, deemed stable for discharge to home .  All new medications had been sent to [facility] pharmacy today.  Post-cath instructions reviewed with the patient over the phone.  Follow-up has been arranged.  All question answered to satisfaction.  Pt is being discharged home today in good condition.  Follow-up Plans & Appointments  Follow-up Information     Loistine Sober, NP Follow up on 11/25/2023.   Specialty: Cardiology Why: at 3:35pm for your cardiology follow up appointment Contact information: [address] [place] [address] [phone]  Discharge Instructions     Amb Referral to Cardiac Rehabilitation   Complete by: As directed    Diagnosis:  Coronary Stents NSTEMI     After initial evaluation and assessments completed: Virtual Based Care may be provided alone or in conjunction with Phase 2 Cardiac Rehab based on patient barriers.: Yes   Intensive Cardiac Rehabilitation (ICR) [facility] location only OR Traditional Cardiac Rehabilitation (TCR) *If criteria for ICR are not met will enroll in TCR [facility] only): Yes   Diet - low sodium heart healthy   Complete by: As directed    Discharge instructions   Complete by: As directed    Radial Site Care  Refer to this sheet in the next few weeks. These instructions provide you with information on caring for yourself after your procedure. Your caregiver  may also give you more specific instructions. Your treatment has been planned according to current medical practices, but problems sometimes occur. Call your caregiver if you have any problems or questions after your procedure.  HOME CARE INSTRUCTIONS You may shower the day after the procedure. Remove the bandage (dressing) and gently wash the site with plain soap and water . Gently pat the site dry.  Do not apply powder or lotion to the site.  Do not submerge the affected site in water  for 3 to 5 days.  Inspect the site at least twice daily.  Do not flex or bend the affected arm for 24 hours.  No lifting over 5 pounds (2.3 kg) for 5 days after your procedure.  Do not drive home if you are discharged the same day of the procedure. Have someone else drive you.  You may drive 24 hours after the procedure unless otherwise instructed by your caregiver.   What to expect: Any bruising will usually fade within 1 to 2 weeks.  Blood that collects in the tissue (hematoma) may be painful to the touch. It should usually decrease in size and tenderness within 1 to 2 weeks.   SEEK IMMEDIATE MEDICAL CARE IF: You have unusual pain at the radial site.  You have redness, warmth, swelling, or pain at the radial site.  You have drainage (other than a small amount of blood on the dressing).  You have chills.  You have a fever or persistent symptoms for more than 72 hours.  You have a fever and your symptoms suddenly get worse.  Your arm becomes pale, cool, tingly, or numb.  You have heavy bleeding from the site. Hold pressure on the site.     PLEASE DO NOT MISS ANY DOSES OF YOUR ASPIRIN //PLAVIX  !!!!!   Also keep a log of you blood pressures and bring back to your follow up appt. Please call the office with any questions.   Patients taking blood thinners should generally stay away from medicines like ibuprofen, Advil, Motrin, naproxen, and Aleve due to risk of stomach bleeding. You may take Tylenol  as  directed or talk to your primary doctor about alternatives.  PLEASE ENSURE THAT YOU DO NOT RUN OUT OF YOUR ASPIRIN /PLAVIX . This medication is very important to remain on for at least one year. IF you have issues obtaining this medication due to cost please CALL the office 3-5 business days prior to running out in order to prevent missing doses of this medication.   PLEASE REMEMBER TO BRING ALL OF YOUR MEDICATIONS TO EACH OF YOUR FOLLOW-UP OFFICE VISITS.  PLEASE ATTEND ALL SCHEDULED FOLLOW-UP APPOINTMENTS.   Activity: Increase activity slowly as tolerated. You may shower,  but no soaking baths (or swimming) for 1 week. No driving for 24 hours. No lifting over 5 lbs for 1 week. No sexual activity for 1 week.   You May Return to Work: in 2 weeks if applicable   Wound Care: You may wash cath site gently with soap and water . Keep cath site clean and dry. If you notice pain, swelling, bleeding or pus at your cath site, please call [phone].   Increase activity slowly   Complete by: As directed        Discharge Medications Allergies as of 11/06/2023       Reactions   Ozempic  (0.25 Or 0.5 Mg-dose) [semaglutide (0.25 Or 0.5mg -dos)]    Presumed cause of GI upset.     Amoxil [amoxicillin] Rash        Medication List     STOP taking these medications    heparin  25000 UT/250ML infusion   isosorbide  mononitrate 30 MG 24 hr tablet Commonly known as: IMDUR    nitroGLYCERIN  0.2 mg/mL infusion   TURMERIC CURCUMIN PO       TAKE these medications    amLODipine  10 MG tablet Commonly known as: NORVASC  Take 1 tablet (10 mg total) by mouth daily.   aspirin  EC 81 MG tablet Take 81 mg by mouth daily. Swallow whole.   atorvastatin  80 MG tablet Commonly known as: LIPITOR Take 1 tablet (80 mg total) by mouth daily.   carvedilol  12.5 MG tablet Commonly known as: COREG  Take 1 tablet (12.5 mg total) by mouth 2 (two) times daily.   cholecalciferol  25 MCG (1000 UNIT) tablet Commonly  known as: VITAMIN D3 Take 1,000 Units by mouth every evening.   clopidogrel  75 MG tablet Commonly known as: PLAVIX  Take 1 tablet (75 mg total) by mouth daily with breakfast. What changed: when to take this   CoQ10 100 MG Caps Take 100 mg by mouth every evening.   cyanocobalamin  1000 MCG tablet Commonly known as: VITAMIN B12 Take 1,000 mcg by mouth daily.   Dexcom G6 Transmitter Misc 1 Device by Does not apply route every 3 (three) months. E11.65   Farxiga  10 MG Tabs tablet Generic drug: dapagliflozin  propanediol TAKE 1 TABLET BY MOUTH EVERY DAY BEFORE BREAKFAST   fenofibrate  160 MG tablet Take 1 tablet (160 mg total) by mouth daily.   Gvoke HypoPen  2-Pack 1 MG/0.2ML Soaj Generic drug: Glucagon  INJECT 0.2 MG UNDER SKIN AS NEEDED FOR LOW BLOOD SUGARS   HumaLOG  KwikPen 200 UNIT/ML KwikPen Generic drug: insulin  lispro USE AS INSTRUCTED IN THE INSULIN  PUMP MAX DAILY 145 UNITS   Insulin  Pen Needle 32G X 4 MM Misc Use 4x a day   MegaRed Omega-3 Krill Oil 350 MG Caps Take 350 mg by mouth daily.   metFORMIN  1000 MG tablet Commonly known as: GLUCOPHAGE  TAKE 1 TABLET BY MOUTH TWICE A DAY What changed:  how much to take when to take this   nitroGLYCERIN  0.4 MG SL tablet Commonly known as: NITROSTAT  Place 1 tablet (0.4 mg total) under the tongue every 5 (five) minutes as needed for chest pain.   Omnipod 5 DexG7G6 Pods Gen 5 Misc USE AS INSTRUCTED TO ADMINISTER INSULIN    OneTouch Ultra test strip Generic drug: glucose blood Use to check blood sugar daily. Dx E11.9   PRESERVISION AREDS 2 PO Take 1 tablet by mouth 2 (two) times daily.   sildenafil  100 MG tablet Commonly known as: Viagra  Take 1 tablet (100 mg total) by mouth as needed for erectile dysfunction.   spironolactone   25 MG tablet Commonly known as: ALDACTONE  Take 1 tablet (25 mg total) by mouth daily.   traZODone  150 MG tablet Commonly known as: DESYREL  Take 1 tablet (150 mg total) by mouth daily. What  changed: when to take this   valsartan  160 MG tablet Commonly known as: DIOVAN  Take 1 tablet (160 mg total) by mouth daily.         Outstanding Labs/Studies N/A   Duration of Discharge Encounter: APP Time: 40 minutes   Signed, Shirl Fruits, NP 11/06/2023, 10:31 AM

## 2023-11-06 NOTE — Progress Notes (Signed)
 Patient had a 3 x 3 cm hematoma that was marked and hard to the touch. Once the TR band was taken off the hematoma spread out from where it was marked and become soft. It appears that once the TR band was taken off the hematoma dissipated becoming less swollen and softer but slightly larger than the marked area. Patient has minimal discomfort and it appears no new bleeding occurred after the original hematoma formation. Day shift was informed and assessed the site.

## 2023-11-06 NOTE — Progress Notes (Signed)
 Escorted via wheel chair to private auto for discharge.  Went by [facility] pharmacy on the way out

## 2023-11-06 NOTE — Progress Notes (Signed)
 CARDIAC REHAB PHASE I   PRE:  Rate/Rhythm: 72 NSR  BP:  Sitting: 159/70      SpO2: 96 RA  MODE:  Ambulation: 470 ft    POST:  Rate/Rhythm: 76 NSR  BP:  Sitting: 170/77      SpO2: 96 RA  Pt ambulated with supervision assistance, pt walked well w/o symptoms.  Pt was educated on NSTEMI, stent card, stent location, Antiplatelet and ASA use, wt restrictions, no baths/daily wash-ups, s/s of infection, ex guidelines, s/s to stop exercising, NTG use and calling 911, heart healthy diet, risk factors and CRPII. Pt received MI book and materials on exercise, diet, and CRPII. Will refer to [facility].    Garen FORBES Candy  MS, ACSM-CEP 9:00 AM 11/06/2023    Service time is from 0830 to 0900.

## 2023-11-06 NOTE — Telephone Encounter (Signed)
   Transition of Care Follow-up Phone Call Request    Patient Name: Luis Wise Date of Birth: December 21, 1940 Date of Encounter: 11/06/2023  Primary Care Provider:  Cleatus Arlyss RAMAN, MD Primary Cardiologist:  Timothy Gollan, MD  [name] has been scheduled for a transition of care follow up appointment with a [facility] provider:  Loistine Sober, NP   Please reach out to [name] within 48 hours of discharge to confirm appointment and review transition of care protocol questionnaire. Anticipated discharge date: 11/06/23  Shirl Fruits, NP  11/06/2023, 10:35 AM

## 2023-11-07 ENCOUNTER — Encounter: Payer: Self-pay | Admitting: Internal Medicine

## 2023-11-07 LAB — LIPOPROTEIN A (LPA): Lipoprotein (a): 59.7 nmol/L — ABNORMAL HIGH (ref <75.0)

## 2023-11-08 ENCOUNTER — Ambulatory Visit: Payer: Self-pay | Admitting: Family Medicine

## 2023-11-09 ENCOUNTER — Telehealth: Payer: Self-pay

## 2023-11-09 NOTE — Transitions of Care (Post Inpatient/ED Visit) (Signed)
 11/09/2023  Name: Luis Wise MRN: [id number] DOB: [birth year]  Today's TOC FU Call Status: Today's TOC FU Call Status:: Successful TOC FU Call Completed TOC FU Call Complete Date: 11/09/23 Patient's Name and Date of Birth confirmed.  Transition Care Management Follow-up Telephone Call Date of Discharge: 11/06/23 Discharge Facility: [facility]) Type of Discharge: Inpatient Admission Primary Inpatient Discharge Diagnosis:: NSTEMI How have you been since you were released from the hospital?:  (patient states,  I am feeling fantastic.) Any questions or concerns?: No  Items Reviewed: Did you receive and understand the discharge instructions provided?: Yes Medications obtained,verified, and reconciled?: Yes (Medications Reviewed) Any new allergies since your discharge?: No Dietary orders reviewed?: Yes Type of Diet Ordered:: low salt heart healthy Do you have support at home?: Yes People in Home [RPT]: spouse Name of Support/Comfort Primary Source: Emerick Holms  Medications Reviewed Today: Medications Reviewed Today     Reviewed by Oberon Hehir E, RN (Registered Nurse) on 11/09/23 at 1046  Med List Status: <None>   Medication Order Taking? Sig Documenting Provider Last Dose Status Informant  amLODipine  (NORVASC ) 10 MG tablet [id number] Yes Take 1 tablet (10 mg total) by mouth daily. Cleatus Arlyss RAMAN, MD  Active Self  aspirin  EC 81 MG tablet [id number] Yes Take 81 mg by mouth daily. Swallow whole. [provider]  Active Self  atorvastatin  (LIPITOR) 80 MG tablet [id number] Yes Take 1 tablet (80 mg total) by mouth daily. Gollan, Timothy J, MD  Active Self  carvedilol  (COREG ) 12.5 MG tablet [id number] Yes Take 1 tablet (12.5 mg total) by mouth 2 (two) times daily. Gollan, Timothy J, MD  Active Self  cholecalciferol  (VITAMIN D3) 25 MCG (1000 UNIT) tablet [id number] Yes Take 1,000 Units by mouth every evening. [provider]  Active Self  clopidogrel  (PLAVIX ) 75 MG  tablet [id number] Yes Take 1 tablet (75 mg total) by mouth daily with breakfast. Zhao, Xika, NP  Active   Coenzyme Q10 (COQ10) 100 MG CAPS [id number] Yes Take 100 mg by mouth every evening. [provider]  Active Self  Continuous Blood Gluc Transmit (DEXCOM G6 TRANSMITTER) MISC [id number] Yes 1 Device by Does not apply route every 3 (three) months. E11.65 Trixie File, MD  Active Self  cyanocobalamin  (VITAMIN B12) 1000 MCG tablet [id number] Yes Take 1,000 mcg by mouth daily. [provider]  Active Self  dapagliflozin  propanediol (FARXIGA ) 10 MG TABS tablet [id number] Yes TAKE 1 TABLET BY MOUTH EVERY DAY BEFORE OFILIA Trixie File, MD  Active Self  fenofibrate  160 MG tablet [id number] Yes Take 1 tablet (160 mg total) by mouth daily. Cleatus Arlyss RAMAN, MD  Active Self  glucose blood [facility] ULTRA) test strip [id number] Yes Use to check blood sugar daily. Dx E11.9 Cleatus Arlyss RAMAN, MD  Active Self  GVOKE HYPOPEN  2-PACK 1 MG/0.2ML EMMANUEL [id number] Yes INJECT 0.2 MG UNDER SKIN AS NEEDED FOR LOW BLOOD SUGARS Trixie File, MD  Active Self  Insulin  Disposable Pump (OMNIPOD 5 G6 PODS, GEN 5,) MISC [id number]  USE AS INSTRUCTED TO ADMINISTER INSULIN  Trixie File, MD  Active Self  insulin  lispro (HUMALOG  KWIKPEN) 200 UNIT/ML KwikPen [id number] Yes USE AS INSTRUCTED IN THE INSULIN  PUMP MAX DAILY 145 UNITS Trixie File, MD  Active Self           Med Note (SATTERFIELD, TEENA BRAVO   Thu Nov 04, 2023  5:28 PM) Patient verified this medication is correct   Insulin  Pen Needle 32G X 4 MM MISC [id number] Yes Use  4x a day Trixie File, MD  Active Self  MegaRed Omega-3 Krill Oil 350 MG CAPS [id number] Yes Take 350 mg by mouth daily. [provider]  Active Self  metFORMIN  (GLUCOPHAGE ) 1000 MG tablet [id number] Yes TAKE 1 TABLET BY MOUTH TWICE A DAY Cleatus Arlyss RAMAN, MD  Active Self  Multiple Vitamins-Minerals (PRESERVISION AREDS 2 PO) [id number] Yes Take 1 tablet by mouth 2  (two) times daily.  [provider]  Active Self  nitroGLYCERIN  (NITROSTAT ) 0.4 MG SL tablet [id number] Yes Place 1 tablet (0.4 mg total) under the tongue every 5 (five) minutes as needed for chest pain. Loistine Sober, NP  Active Self  sildenafil  (VIAGRA ) 100 MG tablet [id number] Yes Take 1 tablet (100 mg total) by mouth as needed for erectile dysfunction. Cleatus Arlyss RAMAN, MD  Active Self  spironolactone  (ALDACTONE ) 25 MG tablet [id number] Yes Take 1 tablet (25 mg total) by mouth daily. Zhao, Xika, NP  Active   traZODone  (DESYREL ) 150 MG tablet [id number] Yes Take 1 tablet (150 mg total) by mouth daily. Cleatus Arlyss RAMAN, MD  Active Self  valsartan  (DIOVAN ) 160 MG tablet [id number] Yes Take 1 tablet (160 mg total) by mouth daily. Gollan, Timothy J, MD  Active Self            Home Care and Equipment/Supplies: Were Home Health Services Ordered?: No Any new equipment or medical supplies ordered?: No  Functional Questionnaire: Do you need assistance with bathing/showering or dressing?: No Do you need assistance with meal preparation?: No Do you need assistance with eating?: No Do you have difficulty maintaining continence: No Do you need assistance with getting out of bed/getting out of a chair/moving?: No Do you have difficulty managing or taking your medications?: No  Follow up appointments reviewed: PCP Follow-up appointment confirmed?: No (offered to schedule hospital follow up visit with patient. Patient declined stating he or his wife will schedule.) Specialist Hospital Follow-up appointment confirmed?: Yes Date of Specialist follow-up appointment?: 11/25/23 Follow-Up Specialty Provider:: Loistine Sober, NP Do you need transportation to your follow-up appointment?: No Do you understand care options if your condition(s) worsen?: Yes-patient verbalized understanding  SDOH Interventions Today    Flowsheet Row Most Recent Value  SDOH Interventions   Food Insecurity  Interventions Intervention Not Indicated  Housing Interventions Intervention Not Indicated  Transportation Interventions Intervention Not Indicated  Utilities Interventions Intervention Not Indicated   Discussed and offered 30 day TOC program.  Patient  declined.  The patient has been provided with contact information for the care management team and has been advised to call with any health -related questions or concerns.  The patient verbalized understanding with current plan of care.  The patient is directed to their insurance card regarding availability of benefits coverage.    Arvin Seip RN, BSN, CCM [employer], Population Health Case Manager Phone: [phone]

## 2023-11-09 NOTE — Patient Instructions (Signed)
 Visit Information  Thank you for taking time to visit with me today. Please don't hesitate to contact me if I can be of assistance to you   Patient instructions:   Schedule hospital follow up visit with primary care provider.  monitor catheter site for increase in bruising, swelling, bleeding, pain.  Seek emergency medical services for chest pain, increase in shortness of breath, stroke like symptoms Notify provider for any new/ ongoing symptoms.    Patient verbalizes understanding of instructions and care plan provided today and agrees to view in MyChart. Active MyChart status and patient understanding of how to access instructions and care plan via MyChart confirmed with patient.     The patient has been provided with contact information for the care management team and has been advised to call with any health related questions or concerns.  No further follow up required: patient advised to schedule hospital follow up visit with primary care provider.   Please call the care guide team at [phone] if you need to cancel or reschedule your appointment.   Please call the Suicide and Crisis Lifeline: 988 call the [place]  National Suicide Prevention Lifeline: (770)324-9607 or TTY: 417-319-4823 TTY 8316081384) to talk to a trained counselor call 1-800-273-TALK (toll free, 24 hour hotline) go to [facility] [address] [place] [phone]) if you are experiencing a Mental Health or Behavioral Health Crisis or need someone to talk to.  Arvin Seip RN, BSN, CCM [employer], Population Health Case Manager Phone: [phone]

## 2023-11-18 ENCOUNTER — Encounter: Attending: Cardiovascular Disease | Admitting: *Deleted

## 2023-11-18 DIAGNOSIS — I25708 Atherosclerosis of coronary artery bypass graft(s), unspecified, with other forms of angina pectoris: Secondary | ICD-10-CM | POA: Insufficient documentation

## 2023-11-18 DIAGNOSIS — Z955 Presence of coronary angioplasty implant and graft: Secondary | ICD-10-CM | POA: Insufficient documentation

## 2023-11-18 DIAGNOSIS — I214 Non-ST elevation (NSTEMI) myocardial infarction: Secondary | ICD-10-CM | POA: Insufficient documentation

## 2023-11-18 NOTE — Progress Notes (Signed)
 Initial orientation completed. Diagnosis can be found in 8/28. EP orientation scheduled for Wednesday 9/17 at 8am.

## 2023-11-22 NOTE — Progress Notes (Unsigned)
 Cardiology Clinic Note   Date: 11/25/2023 ID: Luis Wise, DOB Dec 16, 1940, MRN [id number]  Primary Cardiologist:  Evalene Lunger, MD  Chief Complaint   Luis Wise is a 83 y.o. male who presents to the clinic today for hospital follow up.   Patient Profile   Luis Wise is followed by Dr. Gollan for the history outlined below.       Past medical history significant for: CAD. LHC 01/19/2020 (abnormal coronary CTA): Proximal RCA 60%.  Distal RCA 50%.  RPDA 50%.  Mid RCA 70%.  OM2 80%.  Distal LCx 80%.  Proximal to mid LAD 100%.  D1 80%.  Proximal LAD 70%.  Severe heavily calcified three-vessel CAD.  Chronic occlusion of proximal to mid LAD with bridging collaterals as well as faint right to left collaterals.  Mildly elevated LVEDP.  Normal LV systolic function.  Recommend CT consult. CABG x 5 01/30/2020: LIMA to LAD, SVG to diagonal, sequential SVG to OM1 and OM 2, SVG to posterior descending. LHC 11/04/2023 (angina): Proximal LAD 70%.  Proximal to mid LAD 100%.  D1 80%.  OM 2 100%.  Distal LCx 80%.  Proximal RCA 60%.  Mid RCA #1 90%, #2 60%.  Mid to distal RCA 50%.  RPDA 50%.  Patent LIMA to LAD.  Sequential SVG to OM 2 and OM 3 patent.  SVG to RPDA with 100% origin lesion. Staged PCI 11/05/2023: Successful CSI orbital atherectomy and DES 3 x 12 mm to mid and distal RCA and ostium of PDA.  PCI with DES 3.5 x 26 mm to distal RCA.  DES 3.5 x 34 mm to mid RCA. Echo 11/06/2023: EF 60 to 65%.  No RWMA.  Mild concentric LVH.  Grade I DD.  Abnormal (paradoxical) septal motion consistent with postoperative status.  Normal RV size/function.  Aortic valve with indeterminate number of cusps.  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 Postop PAF. Carotid artery disease. Carotid ultrasound 04/08/2023: Bilateral ICA 1 to 39%. Hypertension. Hyperlipidemia. LPa 11/04/2023: 53.9. Lipid panel 11/04/2023: LDL 43, HDL 25, TG 130, total 94. T2DM.  In summary, patient was first evaluated by Dr. Gollan on  01/02/2020 for chest pain after ED visit. BP was noted to be significantly elevated with SBP in the 190s.  He was noted to have an LBBB.  Initial troponin 47 with delta of 44.  He was started on isosorbide  and underwent coronary CTA which demonstrated calcium  score of 5706 with heavily calcified coronary arteries causing significant/severe stenosis of greater than 70% in the mid LAD, mid RCA, and ostial OM1.  FFR was significant in the mid LAD.  He underwent LHC which showed severe heavily calcified three-vessel CAD with CTO of proximal to mid LAD with bridging collaterals and faint right to left collaterals.  He was referred to CT surgery and underwent CABG x 5 on 01/30/2020.  Preoperative echo demonstrated EF 55 to 60% as detailed above.  His operative course was complicated by A-fib treated with amiodarone  with subsequent conversion to sinus rhythm.  Upon transferring to the floor he redeveloped A-fib briefly and converted to sinus rhythm with oral medications.  Patient was last seen in the office by Dr. Gollan on 09/07/2023 for routine follow-up.  He was doing well at that time.  He reported walking 3 miles in the park 3 times a [place].  He was hypertensive with initial blood pressure with some improvement on recheck.  He was  instructed to monitor BP at home.  Patient contacted the office on 11/01/2023 with complaints of chest pain.  Per triage RN The patient called to report experiencing chest pain a couple of times in the morning over the weekend. He stated the symptoms occurred upon waking and described the pain as a tightness that radiated across his chest. The patient was unable to identify any precipitating factors but noted that the symptoms relieved with Motrin. He denies any other symptoms during that time and currently denies chest pain. Additionally, the patient reported that his blood pressure has been within normal limits.   Patient was last seen in  the office by me on 11/01/2023 for evaluation of chest pain.  Patient described 3 days of chest tightness that felt like a weight on his chest that began upon awakening and was more evident with exertion and eased with rest.  He reported another episode the night previous and his wife consider taking him to the ED but he was able to go to sleep.  He was pain-free at the time of his visit.  Given anginal equivalent he was scheduled for LHC and Rx'd as needed SL NTG.  Patient continued to have episodic chest pain prior to his scheduled LHC taking NTG x 9 over 3 days.  He was instructed to present to the ED for accelerating chest pain.  Patient presented to the ED on 11/03/2023 (the day prior to scheduled heart catheterization) for worsening chest pain.  Patient was admitted.  Troponin 112>> 118>> 127>> 143.  LHC demonstrated occluded SVG to RPDA and heavily calcified RCA with tandem lesions as detailed above.  Patient was transferred to [facility] for staged high risk PCI.  He underwent staged PCI on 11/05/2023 with orbital arthrectomy and DES x 3 as detailed above.  Echo demonstrated normal LV/RV function.  Patient was discharged on 11/06/2023.     History of Present Illness    Today, patient is accompanied by his wife. He is doing very well since stent placement. He has not needed any prn SL NTG since hospital discharge. Patient denies shortness of breath, dyspnea on exertion, lower extremity edema, orthopnea or PND. No chest pain, pressure, or tightness. No palpitations. He has started cardiac rehab and is resuming his usual activities.     ROS: All other systems reviewed and are otherwise negative except as noted in History of Present Illness.  EKGs/Labs Reviewed    EKG Interpretation Date/Time:  Thursday November 25 2023 15:44:11 EDT Ventricular Rate:  66 PR Interval:  186 QRS Duration:  140 QT Interval:  412 QTC Calculation: 431 R Axis:   -38  Text Interpretation: Normal sinus rhythm Left  axis deviation Left bundle branch block When compared with ECG of 06-Nov-2023 04:18, Nonspecific T wave abnormality now evident in Inferior leads Confirmed by Loistine Sober [phone]) on 11/25/2023 3:54:52 PM   02/02/2023: ALT 30; AST 23 11/06/2023: BUN 20; Creatinine, Ser 1.04; Potassium 3.6; Sodium 136   11/06/2023: Hemoglobin 13.4; WBC 6.3   02/02/2023: TSH 1.47    Physical Exam    VS:  BP 136/70 (BP Location: Left Arm, Patient Position: Sitting, Cuff Size: Normal)   Pulse 66   Ht 5' 9.5 (1.765 m)   Wt 175 lb 6.4 oz (79.6 kg)   SpO2 96%   BMI 25.53 kg/m  , BMI Body mass index is 25.53 kg/m.  GEN: Well nourished, well developed, in no acute distress. Neck: No JVD or carotid bruits. Cardiac:  RRR.  No murmur. No rubs or gallops.   Respiratory:  Respirations regular and unlabored. Clear to auscultation without rales, wheezing or rhonchi. GI: Soft, nontender, nondistended. Extremities: Radials/DP/PT 2+ and equal bilaterally. No clubbing or cyanosis. No edema.  Skin: Warm and dry, no rash. Neuro: Strength intact.  Assessment & Plan   CAD S/p CABG x 12 January 2020.  Echo demonstrated EF 55 to 60%, mild hypokinesis of left ventricular apical septal wall, mild concentric LVH, Grade I DD, normal RV size/function, no significant valvular abnormalities.  LHC August 2025 for unstable angina demonstrated occluded SVG to RPDA, remaining grafts patent, proximal RCA 60%, mid RCA #1 90%, #2 60%, mid to distal RCA 50%, RPDA 50%.  Patient transferred to [facility] for staged PCI with atherectomy and DES x 3 to distal RCA, mid RCA and distal RCA to ostium of PDA.  Echo demonstrated normal LV/RV function, Grade I DD, abnormal septal motion consistent with postoperative status.  Patient denies chest pain, pressure or tightness. No shortness of breath. Bilateral wrist cath sites are well healed without erythema, edema, ecchymosis, drainage or tenderness.  - Continue aspirin , Plavix , amlodipine ,  carvedilol , atorvastatin , fenofibrate  as needed SL NTG.   Hypertension BP today 158/74 on intake and 136/70. Home BP was initially elevated after hospital discharge but has been trending down to 130-140s systolic.  - Continue valsartan , carvedilol , amlodipine , spironolactone .    Hyperlipidemia LDL 43, TG 130 August 2025, at goal. - Continue atorvastatin  and fenofibrate .  Disposition: CBC and BMP today. Return in 3 months or sooner as needed.          Signed, Luis Wise. Audy Dauphine, DNP, NP-C

## 2023-11-24 ENCOUNTER — Encounter

## 2023-11-24 VITALS — Ht 70.5 in | Wt 174.9 lb

## 2023-11-24 DIAGNOSIS — Z955 Presence of coronary angioplasty implant and graft: Secondary | ICD-10-CM

## 2023-11-24 DIAGNOSIS — I214 Non-ST elevation (NSTEMI) myocardial infarction: Secondary | ICD-10-CM

## 2023-11-24 DIAGNOSIS — I25708 Atherosclerosis of coronary artery bypass graft(s), unspecified, with other forms of angina pectoris: Secondary | ICD-10-CM | POA: Diagnosis not present

## 2023-11-24 NOTE — Patient Instructions (Addendum)
 Patient Instructions  Patient Details  Name: Luis Wise MRN: [id number] Date of Birth: [birth year] Referring Provider:  Perla Evalene PARAS, MD  Below are your personal goals for exercise, nutrition, and risk factors. Our goal is to help you stay on track towards obtaining and maintaining these goals. We will be discussing your progress on these goals with you throughout the program.  Initial Exercise Prescription:  Initial Exercise Prescription - 11/24/23 0900       Date of Initial Exercise RX and Referring Provider   Date 11/24/23    Referring Provider Dr. Timothy Gollan, MD      Oxygen   Maintain Oxygen Saturation 88% or higher      Treadmill   MPH 2.5    Grade 1    Minutes 15    METs 3.26      REL-XR   Level 3    Speed 50    Minutes 15    METs 2.49      T5 Nustep   Level 2    SPM 80    Minutes 15    METs 2.49      Prescription Details   Frequency (times per week) 3    Duration Progress to 30 minutes of continuous aerobic without signs/symptoms of physical distress      Intensity   THRR 40-80% of Max Heartrate 95-125    Ratings of Perceived Exertion 11-13    Perceived Dyspnea 0-4      Progression   Progression Continue to progress workloads to maintain intensity without signs/symptoms of physical distress.      Resistance Training   Training Prescription Yes    Weight 6 lb    Reps 10-15          Exercise Goals: Frequency: Be able to perform aerobic exercise two to three times per week in program working toward 2-5 days per week of home exercise.  Intensity: Work with a perceived exertion of 11 (fairly light) - 15 (hard) while following your exercise prescription.  We will make changes to your prescription with you as you progress through the program.   Duration: Be able to do 30 to 45 minutes of continuous aerobic exercise in addition to a 5 minute warm-up and a 5 minute cool-down routine.   Nutrition Goals: Your personal nutrition goals will be  established when you do your nutrition analysis with the dietician.  The following are general nutrition guidelines to follow: Cholesterol < 200mg /day Sodium < 1500mg /day Fiber: Men over 50 yrs - 30 grams per day  Personal Goals:  Personal Goals and Risk Factors at Admission - 11/18/23 1617       Core Components/Risk Factors/Patient Goals on Admission    Weight Management Yes;Weight Maintenance    Intervention Weight Management: Develop a combined nutrition and exercise program designed to reach desired caloric intake, while maintaining appropriate intake of nutrient and fiber, sodium and fats, and appropriate energy expenditure required for the weight goal.;Weight Management: Provide education and appropriate resources to help participant work on and attain dietary goals.;Weight Management/Obesity: Establish reasonable short term and long term weight goals.    Expected Outcomes Short Term: Continue to assess and modify interventions until short term weight is achieved;Long Term: Adherence to nutrition and physical activity/exercise program aimed toward attainment of established weight goal;Weight Maintenance: Understanding of the daily nutrition guidelines, which includes 25-35% calories from fat, 7% or less cal from saturated fats, less than 200mg  cholesterol, less than 1.5gm of sodium, & 5  or more servings of fruits and vegetables daily;Understanding recommendations for meals to include 15-35% energy as protein, 25-35% energy from fat, 35-60% energy from carbohydrates, less than 200mg  of dietary cholesterol, 20-35 gm of total fiber daily;Understanding of distribution of calorie intake throughout the day with the consumption of 4-5 meals/snacks    Diabetes Yes    Intervention Provide education about signs/symptoms and action to take for hypo/hyperglycemia.;Provide education about proper nutrition, including hydration, and aerobic/resistive exercise prescription along with prescribed medications to  achieve blood glucose in normal ranges: Fasting glucose 65-99 mg/dL    Expected Outcomes Short Term: Participant verbalizes understanding of the signs/symptoms and immediate care of hyper/hypoglycemia, proper foot care and importance of medication, aerobic/resistive exercise and nutrition plan for blood glucose control.;Long Term: Attainment of HbA1C < 7%.    Hypertension Yes    Intervention Provide education on lifestyle modifcations including regular physical activity/exercise, weight management, moderate sodium restriction and increased consumption of fresh fruit, vegetables, and low fat dairy, alcohol moderation, and smoking cessation.;Monitor prescription use compliance.    Expected Outcomes Short Term: Continued assessment and intervention until BP is < 140/38mm HG in hypertensive participants. < 130/21mm HG in hypertensive participants with diabetes, heart failure or chronic kidney disease.;Long Term: Maintenance of blood pressure at goal levels.    Lipids Yes    Intervention Provide education and support for participant on nutrition & aerobic/resistive exercise along with prescribed medications to achieve LDL 70mg , HDL >40mg .    Expected Outcomes Short Term: Participant states understanding of desired cholesterol values and is compliant with medications prescribed. Participant is following exercise prescription and nutrition guidelines.;Long Term: Cholesterol controlled with medications as prescribed, with individualized exercise RX and with personalized nutrition plan. Value goals: LDL < 70mg , HDL > 40 mg.         Exercise Goals and Review:  Exercise Goals     Row Name 11/24/23 0929             Exercise Goals   Increase Physical Activity Yes       Intervention Provide advice, education, support and counseling about physical activity/exercise needs.;Develop an individualized exercise prescription for aerobic and resistive training based on initial evaluation findings, risk  stratification, comorbidities and participant's personal goals.       Expected Outcomes Short Term: Attend rehab on a regular basis to increase amount of physical activity.;Long Term: Add in home exercise to make exercise part of routine and to increase amount of physical activity.;Long Term: Exercising regularly at least 3-5 days a week.       Increase Strength and Stamina Yes       Intervention Provide advice, education, support and counseling about physical activity/exercise needs.;Develop an individualized exercise prescription for aerobic and resistive training based on initial evaluation findings, risk stratification, comorbidities and participant's personal goals.       Expected Outcomes Short Term: Perform resistance training exercises routinely during rehab and add in resistance training at home;Short Term: Increase workloads from initial exercise prescription for resistance, speed, and METs.;Long Term: Improve cardiorespiratory fitness, muscular endurance and strength as measured by increased METs and functional capacity ( )       Able to understand and use rate of perceived exertion (RPE) scale Yes       Intervention Provide education and explanation on how to use RPE scale       Expected Outcomes Short Term: Able to use RPE daily in rehab to express subjective intensity level;Long Term:  Able to use RPE to  guide intensity level when exercising independently       Able to understand and use Dyspnea scale Yes       Intervention Provide education and explanation on how to use Dyspnea scale       Expected Outcomes Short Term: Able to use Dyspnea scale daily in rehab to express subjective sense of shortness of breath during exertion;Long Term: Able to use Dyspnea scale to guide intensity level when exercising independently       Knowledge and understanding of Target Heart Rate Range (THRR) Yes       Intervention Provide education and explanation of THRR including how the numbers were predicted  and where they are located for reference       Expected Outcomes Short Term: Able to state/look up THRR;Short Term: Able to use daily as guideline for intensity in rehab;Long Term: Able to use THRR to govern intensity when exercising independently       Able to check pulse independently Yes       Intervention Provide education and demonstration on how to check pulse in carotid and radial arteries.;Review the importance of being able to check your own pulse for safety during independent exercise       Expected Outcomes Short Term: Able to explain why pulse checking is important during independent exercise;Long Term: Able to check pulse independently and accurately       Understanding of Exercise Prescription Yes       Intervention Provide education, explanation, and written materials on patient's individual exercise prescription       Expected Outcomes Short Term: Able to explain program exercise prescription;Long Term: Able to explain home exercise prescription to exercise independently

## 2023-11-24 NOTE — Progress Notes (Signed)
 Cardiac Individual Treatment Plan  Patient Details  Name: Luis Wise MRN: [id number] Date of Birth: [birth year] Referring Provider:   Flowsheet Row Cardiac Rehab from 11/24/2023 in [facility] Cardiac and Pulmonary Rehab  Referring Provider Dr. Evalene Lunger, MD    Initial Encounter Date:  Flowsheet Row Cardiac Rehab from 11/24/2023 in [facility] Cardiac and Pulmonary Rehab  Date 11/24/23    Visit Diagnosis: NSTEMI (non-ST elevated myocardial infarction) [facility])  Status post coronary artery stent placement  Patient's Home Medications on Admission:  Current Outpatient Medications:    amLODipine  (NORVASC ) 10 MG tablet, Take 1 tablet (10 mg total) by mouth daily., Disp: 90 tablet, Rfl: 3   aspirin  EC 81 MG tablet, Take 81 mg by mouth daily. Swallow whole., Disp: , Rfl:    atorvastatin  (LIPITOR) 80 MG tablet, Take 1 tablet (80 mg total) by mouth daily., Disp: 90 tablet, Rfl: 3   carvedilol  (COREG ) 12.5 MG tablet, Take 1 tablet (12.5 mg total) by mouth 2 (two) times daily., Disp: 180 tablet, Rfl: 3   cholecalciferol  (VITAMIN D3) 25 MCG (1000 UNIT) tablet, Take 1,000 Units by mouth every evening., Disp: , Rfl:    clopidogrel  (PLAVIX ) 75 MG tablet, Take 1 tablet (75 mg total) by mouth daily with breakfast., Disp: 90 tablet, Rfl: 3   Coenzyme Q10 (COQ10) 100 MG CAPS, Take 100 mg by mouth every evening., Disp: , Rfl:    Continuous Blood Gluc Transmit (DEXCOM G6 TRANSMITTER) MISC, 1 Device by Does not apply route every 3 (three) months. E11.65, Disp: 1 each, Rfl: 3   cyanocobalamin  (VITAMIN B12) 1000 MCG tablet, Take 1,000 mcg by mouth daily., Disp: , Rfl:    dapagliflozin  propanediol (FARXIGA ) 10 MG TABS tablet, TAKE 1 TABLET BY MOUTH EVERY DAY BEFORE BREAKFAST, Disp: 90 tablet, Rfl: 3   fenofibrate  160 MG tablet, Take 1 tablet (160 mg total) by mouth daily., Disp: 90 tablet, Rfl: 3   glucose blood (ONETOUCH ULTRA) test strip, Use to check blood sugar daily. Dx E11.9, Disp: 200 strip, Rfl: 3   GVOKE  HYPOPEN 2-PACK 1 MG/0.2ML SOAJ, INJECT 0.2 MG UNDER SKIN AS NEEDED FOR LOW BLOOD SUGARS, Disp: 0.4 mL, Rfl: PRN   Insulin  Disposable Pump (OMNIPOD 5 G6 PODS, GEN 5,) MISC, USE AS INSTRUCTED TO ADMINISTER INSULIN , Disp: 30 each, Rfl: 2   insulin  lispro (HUMALOG  KWIKPEN) 200 UNIT/ML KwikPen, USE AS INSTRUCTED IN THE INSULIN  PUMP MAX DAILY 145 UNITS, Disp: 72 mL, Rfl: 3   Insulin  Pen Needle 32G X 4 MM MISC, Use 4x a day, Disp: 300 each, Rfl: 3   MegaRed Omega-3 Krill Oil 350 MG CAPS, Take 350 mg by mouth daily., Disp: , Rfl:    metFORMIN  (GLUCOPHAGE ) 1000 MG tablet, TAKE 1 TABLET BY MOUTH TWICE A DAY, Disp: 180 tablet, Rfl: 3   Multiple Vitamins-Minerals (PRESERVISION AREDS 2 PO), Take 1 tablet by mouth 2 (two) times daily. , Disp: , Rfl:    nitroGLYCERIN  (NITROSTAT ) 0.4 MG SL tablet, Place 1 tablet (0.4 mg total) under the tongue every 5 (five) minutes as needed for chest pain., Disp: 25 tablet, Rfl: 1   sildenafil  (VIAGRA ) 100 MG tablet, Take 1 tablet (100 mg total) by mouth as needed for erectile dysfunction., Disp: 30 tablet, Rfl: 6   spironolactone  (ALDACTONE ) 25 MG tablet, Take 1 tablet (25 mg total) by mouth daily., Disp: 30 tablet, Rfl: 2   traZODone  (DESYREL ) 150 MG tablet, Take 1 tablet (150 mg total) by mouth daily., Disp: 90 tablet, Rfl: 3  valsartan  (DIOVAN ) 160 MG tablet, Take 1 tablet (160 mg total) by mouth daily., Disp: 90 tablet, Rfl: 3  Past Medical History: Past Medical History:  Diagnosis Date   Allergy    Amoxicillin   Cataract 2017   Mild progression   Coronary artery disease    a. s/p 5-vessel CABG (LIMA-LAD, SVG-diag, sequential SVG-OM1-OM 2, & SVG-PDA)   Diabetes mellitus with complication (HCC)    Hyperlipidemia    Hypertension    Insomnia    neuropathy    bilateral feet   NSTEMI (non-ST elevated myocardial infarction) (HCC) 11/03/2023   Postoperative atrial fibrillation (HCC)     Tobacco Use: Social History   Tobacco Use  Smoking Status Never  Smokeless  Tobacco Never    Labs: Review Flowsheet  More data exists      Latest Ref Rng & Units 02/02/2023 06/03/2023 10/14/2023 11/04/2023 11/06/2023  Labs for ITP Cardiac and Pulmonary Rehab  Cholestrol 0 - 200 mg/dL 91  - - 94  -  LDL (calc) 0 - 99 mg/dL 49  - - 43  -  HDL-C >59 mg/dL 76.79  - - 25  -  Trlycerides <150 mg/dL 01.9  - - 869  -  Hemoglobin A1c 4.8 - 5.6 % - 5.5  5.8  - 5.6      Exercise Target Goals: Exercise Program Goal: Individual exercise prescription set using results from initial 6 min walk test and THRR while considering  patient's activity barriers and safety.   Exercise Prescription Goal: Initial exercise prescription builds to 30-45 minutes a day of aerobic activity, 2-3 days per week.  Home exercise guidelines will be given to patient during program as part of exercise prescription that the participant will acknowledge.   Education: Aerobic Exercise: - Group verbal and visual presentation on the components of exercise prescription. Introduces F.I.T.T principle from ACSM for exercise prescriptions.  Reviews F.I.T.T. principles of aerobic exercise including progression. Written material provided at class time. Flowsheet Row Cardiac Rehab from 04/24/2020 in [facility] Cardiac and Pulmonary Rehab  Date 04/24/20  Educator jh  Instruction Review Code 1- Verbalizes Understanding    Education: Resistance Exercise: - Group verbal and visual presentation on the components of exercise prescription. Introduces F.I.T.T principle from ACSM for exercise prescriptions  Reviews F.I.T.T. principles of resistance exercise including progression. Written material provided at class time.    Education: Exercise & Equipment Safety: - Individual verbal instruction and demonstration of equipment use and safety with use of the equipment. Flowsheet Row Cardiac Rehab from 11/24/2023 in [facility] Cardiac and Pulmonary Rehab  Date 11/24/23  Educator NT  Instruction Review Code 1- Verbalizes Understanding     Education: Exercise Physiology & General Exercise Guidelines: - Group verbal and written instruction with models to review the exercise physiology of the cardiovascular system and associated critical values. Provides general exercise guidelines with specific guidelines to those with heart or lung disease. Written material provided at class time. Flowsheet Row Cardiac Rehab from 04/24/2020 in [facility] Cardiac and Pulmonary Rehab  Date 04/17/20  Educator [facility]  Instruction Review Code 1- Verbalizes Understanding    Education: Flexibility, Balance, Mind/Body Relaxation: - Group verbal and visual presentation with interactive activity on the components of exercise prescription. Introduces F.I.T.T principle from ACSM for exercise prescriptions. Reviews F.I.T.T. principles of flexibility and balance exercise training including progression. Also discusses the mind body connection.  Reviews various relaxation techniques to help reduce and manage stress (i.e. Deep breathing, progressive muscle relaxation, and visualization). Balance handout provided  to take home. Written material provided at class time.   Activity Barriers & Risk Stratification:  Activity Barriers & Cardiac Risk Stratification - 11/18/23 1612       Activity Barriers & Cardiac Risk Stratification   Activity Barriers None    Cardiac Risk Stratification High          6 Minute Walk:  6 Minute Walk     Row Name 11/24/23 0928         6 Minute Walk   Distance 1350 feet     Walk Time 6 minutes     # of Rest Breaks 0     MPH 2.56     METS 2.49     RPE 12     Perceived Dyspnea  0     VO2 Peak 8.71     Symptoms No     Resting HR 66 bpm     Resting BP 132/64     Resting Oxygen Saturation  95 %     Exercise Oxygen Saturation  during 6 min walk 94 %     Max Ex. HR 83 bpm     Max Ex. BP 128/58     2 Minute Post BP 118/60        Oxygen Initial Assessment:   Oxygen Re-Evaluation:   Oxygen Discharge (Final Oxygen  Re-Evaluation):   Initial Exercise Prescription:  Initial Exercise Prescription - 11/24/23 0900       Date of Initial Exercise RX and Referring Provider   Date 11/24/23    Referring Provider Dr. Timothy Gollan, MD      Oxygen   Maintain Oxygen Saturation 88% or higher      Treadmill   MPH 2.5    Grade 1    Minutes 15    METs 3.26      REL-XR   Level 3    Speed 50    Minutes 15    METs 2.49      T5 Nustep   Level 2    SPM 80    Minutes 15    METs 2.49      Prescription Details   Frequency (times per week) 3    Duration Progress to 30 minutes of continuous aerobic without signs/symptoms of physical distress      Intensity   THRR 40-80% of Max Heartrate 95-125    Ratings of Perceived Exertion 11-13    Perceived Dyspnea 0-4      Progression   Progression Continue to progress workloads to maintain intensity without signs/symptoms of physical distress.      Resistance Training   Training Prescription Yes    Weight 6 lb    Reps 10-15          Perform Capillary Blood Glucose checks as needed.  Exercise Prescription Changes:   Exercise Prescription Changes     Row Name 11/24/23 0900             Response to Exercise   Blood Pressure (Admit) 132/64       Blood Pressure (Exercise) 128/58       Blood Pressure (Exit) 118/60       Heart Rate (Admit) 66 bpm       Heart Rate (Exercise) 83 bpm       Heart Rate (Exit) 66 bpm       Oxygen Saturation (Admit) 95 %       Oxygen Saturation (Exercise) 94 %       Rating  of Perceived Exertion (Exercise) 12       Perceived Dyspnea (Exercise) 0       Symptoms none       Comments Results          Exercise Comments:   Exercise Goals and Review:   Exercise Goals     Row Name 11/24/23 0929             Exercise Goals   Increase Physical Activity Yes       Intervention Provide advice, education, support and counseling about physical activity/exercise needs.;Develop an individualized exercise  prescription for aerobic and resistive training based on initial evaluation findings, risk stratification, comorbidities and participant's personal goals.       Expected Outcomes Short Term: Attend rehab on a regular basis to increase amount of physical activity.;Long Term: Add in home exercise to make exercise part of routine and to increase amount of physical activity.;Long Term: Exercising regularly at least 3-5 days a week.       Increase Strength and Stamina Yes       Intervention Provide advice, education, support and counseling about physical activity/exercise needs.;Develop an individualized exercise prescription for aerobic and resistive training based on initial evaluation findings, risk stratification, comorbidities and participant's personal goals.       Expected Outcomes Short Term: Perform resistance training exercises routinely during rehab and add in resistance training at home;Short Term: Increase workloads from initial exercise prescription for resistance, speed, and METs.;Long Term: Improve cardiorespiratory fitness, muscular endurance and strength as measured by increased METs and functional capacity ( )       Able to understand and use rate of perceived exertion (RPE) scale Yes       Intervention Provide education and explanation on how to use RPE scale       Expected Outcomes Short Term: Able to use RPE daily in rehab to express subjective intensity level;Long Term:  Able to use RPE to guide intensity level when exercising independently       Able to understand and use Dyspnea scale Yes       Intervention Provide education and explanation on how to use Dyspnea scale       Expected Outcomes Short Term: Able to use Dyspnea scale daily in rehab to express subjective sense of shortness of breath during exertion;Long Term: Able to use Dyspnea scale to guide intensity level when exercising independently       Knowledge and understanding of Target Heart Rate Range (THRR) Yes        Intervention Provide education and explanation of THRR including how the numbers were predicted and where they are located for reference       Expected Outcomes Short Term: Able to state/look up THRR;Short Term: Able to use daily as guideline for intensity in rehab;Long Term: Able to use THRR to govern intensity when exercising independently       Able to check pulse independently Yes       Intervention Provide education and demonstration on how to check pulse in carotid and radial arteries.;Review the importance of being able to check your own pulse for safety during independent exercise       Expected Outcomes Short Term: Able to explain why pulse checking is important during independent exercise;Long Term: Able to check pulse independently and accurately       Understanding of Exercise Prescription Yes       Intervention Provide education, explanation, and written materials on patient's individual exercise prescription  Expected Outcomes Short Term: Able to explain program exercise prescription;Long Term: Able to explain home exercise prescription to exercise independently          Exercise Goals Re-Evaluation :   Discharge Exercise Prescription (Final Exercise Prescription Changes):  Exercise Prescription Changes - 11/24/23 0900       Response to Exercise   Blood Pressure (Admit) 132/64    Blood Pressure (Exercise) 128/58    Blood Pressure (Exit) 118/60    Heart Rate (Admit) 66 bpm    Heart Rate (Exercise) 83 bpm    Heart Rate (Exit) 66 bpm    Oxygen Saturation (Admit) 95 %    Oxygen Saturation (Exercise) 94 %    Rating of Perceived Exertion (Exercise) 12    Perceived Dyspnea (Exercise) 0    Symptoms none    Comments Results          Nutrition:  Target Goals: Understanding of nutrition guidelines, daily intake of sodium 1500mg , cholesterol 200mg , calories 30% from fat and 7% or less from saturated fats, daily to have 5 or more servings of fruits and  vegetables.  Education: Nutrition 1 -Group instruction provided by verbal, written material, interactive activities, discussions, models, and posters to present general guidelines for heart healthy nutrition including macronutrients, label reading, and promoting whole foods over processed counterparts. Education serves as Pensions consultant of discussion of heart healthy eating for all. Written material provided at class time.    Education: Nutrition 2 -Group instruction provided by verbal, written material, interactive activities, discussions, models, and posters to present general guidelines for heart healthy nutrition including sodium, cholesterol, and saturated fat. Providing guidance of habit forming to improve blood pressure, cholesterol, and body weight. Written material provided at class time.     Biometrics:  Pre Biometrics - 11/24/23 0930       Pre Biometrics   Height 5' 10.5 (1.791 m)    Weight 174 lb 14.4 oz (79.3 kg)    Waist Circumference 38 inches    Hip Circumference 37 inches    Waist to Hip Ratio 1.03 %    BMI (Calculated) 24.73    Single Leg Stand 25.9 seconds           Nutrition Therapy Plan and Nutrition Goals:  Nutrition Therapy & Goals - 11/24/23 0927       Nutrition Therapy   RD appointment deferred Yes      Intervention Plan   Intervention Prescribe, educate and counsel regarding individualized specific dietary modifications aiming towards targeted core components such as weight, hypertension, lipid management, diabetes, heart failure and other comorbidities.    Expected Outcomes Short Term Goal: Understand basic principles of dietary content, such as calories, fat, sodium, cholesterol and nutrients.;Short Term Goal: A plan has been developed with personal nutrition goals set during dietitian appointment.;Long Term Goal: Adherence to prescribed nutrition plan.          Nutrition Assessments:  MEDIFICTS Score Key: >=70 Need to make dietary changes   40-70 Heart Healthy Diet <= 40 Therapeutic Level Cholesterol Diet  Flowsheet Row Cardiac Rehab from 11/24/2023 in [facility] Cardiac and Pulmonary Rehab  Picture Your Plate Total Score on Admission 73   Picture Your Plate Scores: <59 Unhealthy dietary pattern with much room for improvement. 41-50 Dietary pattern unlikely to meet recommendations for good health and room for improvement. 51-60 More healthful dietary pattern, with some room for improvement.  >60 Healthy dietary pattern, although there may be some specific behaviors that could be improved.  Nutrition Goals Re-Evaluation:   Nutrition Goals Discharge (Final Nutrition Goals Re-Evaluation):   Psychosocial: Target Goals: Acknowledge presence or absence of significant depression and/or stress, maximize coping skills, provide positive support system. Participant is able to verbalize types and ability to use techniques and skills needed for reducing stress and depression.   Education: Stress, Anxiety, and Depression - Group verbal and visual presentation to define topics covered.  Reviews how body is impacted by stress, anxiety, and depression.  Also discusses healthy ways to reduce stress and to treat/manage anxiety and depression. Written material provided at class time.   Education: Sleep Hygiene -Provides group verbal and written instruction about how sleep can affect your health.  Define sleep hygiene, discuss sleep cycles and impact of sleep habits. Review good sleep hygiene tips.   Initial Review & Psychosocial Screening:  Initial Psych Review & Screening - 11/18/23 1631       Initial Review   Current issues with None Identified      Family Dynamics   Good Support System? Yes      Barriers   Psychosocial barriers to participate in program There are no identifiable barriers or psychosocial needs.      Screening Interventions   Interventions Encouraged to exercise;To provide support and resources with identified  psychosocial needs;Provide feedback about the scores to participant    Expected Outcomes Short Term goal: Utilizing psychosocial counselor, staff and physician to assist with identification of specific Stressors or current issues interfering with healing process. Setting desired goal for each stressor or current issue identified.;Long Term Goal: Stressors or current issues are controlled or eliminated.;Short Term goal: Identification and review with participant of any Quality of Life or Depression concerns found by scoring the questionnaire.;Long Term goal: The participant improves quality of Life and PHQ9 Scores as seen by post scores and/or verbalization of changes          Quality of Life Scores:   Quality of Life - 11/24/23 0925       Quality of Life   Select Quality of Life      Quality of Life Scores   Health/Function Pre 25.7 %    Socioeconomic Pre 24.5 %    Psych/Spiritual Pre 23.71 %    Family Pre 28.8 %    GLOBAL Pre 25.47 %         Scores of 19 and below usually indicate a poorer quality of life in these areas.  A difference of  2-3 points is a clinically meaningful difference.  A difference of 2-3 points in the total score of the Quality of Life Index has been associated with significant improvement in overall quality of life, self-image, physical symptoms, and general health in studies assessing change in quality of life.  PHQ-9: Review Flowsheet  More data exists      11/24/2023 11/09/2023 02/09/2023 08/06/2022 12/22/2021  Depression screen PHQ 2/9  Decreased Interest 0 0 0 0 0  Down, Depressed, Hopeless 0 0 0 0 0  PHQ - 2 Score 0 0 0 0 0  Altered sleeping 0 - 0 0 1  Tired, decreased energy 0 - 0 0 0  Change in appetite 0 - 0 0 0  Feeling bad or failure about yourself  0 - 0 0 0  Trouble concentrating 0 - 0 0 0  Moving slowly or fidgety/restless 0 - 0 0 0  Suicidal thoughts 0 - 0 0 -  PHQ-9 Score 0 - 0 0 1  Difficult doing work/chores - -  Not difficult at all Not  difficult at all Not difficult at all   Interpretation of Total Score  Total Score Depression Severity:  1-4 = Minimal depression, 5-9 = Mild depression, 10-14 = Moderate depression, 15-19 = Moderately severe depression, 20-27 = Severe depression   Psychosocial Evaluation and Intervention:  Psychosocial Evaluation - 11/18/23 1632       Psychosocial Evaluation & Interventions   Comments Mr. Overfelt is coming to cardiac rehab post NSTEMI and stent. [place] where he noticed his increase in symptoms. His [place] is [place] He reports not stress concerns at this time. His wife is a retired Engineer, civil (consulting) and helps with managing his heart healthy living.    Expected Outcomes Short: attend cardiac rehab for education and exercise Long: develop and maintain positive self care habits    Continue Psychosocial Services  Follow up required by staff          Psychosocial Re-Evaluation:   Psychosocial Discharge (Final Psychosocial Re-Evaluation):   Vocational Rehabilitation: Provide vocational rehab assistance to qualifying candidates.   Vocational Rehab Evaluation & Intervention:  Vocational Rehab - 11/18/23 1631       Initial Vocational Rehab Evaluation & Intervention   Assessment shows need for Vocational Rehabilitation No          Education: Education Goals: Education classes will be provided on a variety of topics geared toward better understanding of heart health and risk factor modification. Participant will state understanding/return demonstration of topics presented as noted by education test scores.  Learning Barriers/Preferences:  Learning Barriers/Preferences - 11/18/23 1631       Learning Barriers/Preferences   Learning Barriers None    Learning Preferences None          General Cardiac Education Topics:  AED/CPR: - Group verbal and written instruction with the  use of models to demonstrate the basic use of the AED with the basic ABC's of resuscitation.   Test and Procedures: - Group verbal and visual presentation and models provide information about basic cardiac anatomy and function. Reviews the testing methods done to diagnose heart disease and the outcomes of the test results. Describes the treatment choices: Medical Management, Angioplasty, or Coronary Bypass Surgery for treating various heart conditions including Myocardial Infarction, Angina, Valve Disease, and Cardiac Arrhythmias. Written material provided at class time. Flowsheet Row Cardiac Rehab from 11/24/2023 in [facility] Cardiac and Pulmonary Rehab  Education need identified 11/24/23    Medication Safety: - Group verbal and visual instruction to review commonly prescribed medications for heart and lung disease. Reviews the medication, class of the drug, and side effects. Includes the steps to properly store meds and maintain the prescription regimen. Written material provided at class time. Flowsheet Row Cardiac Rehab from 04/24/2020 in [facility] Cardiac and Pulmonary Rehab  Date 03/27/20  Educator SB  Instruction Review Code 1- Verbalizes Understanding    Intimacy: - Group verbal instruction through game format to discuss how heart and lung disease can affect sexual intimacy. Written material provided at class time. Flowsheet Row Cardiac Rehab from 04/24/2020 in [facility] Cardiac and Pulmonary Rehab  Date 04/24/20  Educator jh  Instruction Review Code 1- Verbalizes Understanding    Know Your Numbers and Heart Failure: - Group verbal and visual instruction to discuss disease risk factors for cardiac and pulmonary disease and treatment options.  Reviews associated critical values for  Overweight/Obesity, Hypertension, Cholesterol, and Diabetes.  Discusses basics of heart failure: signs/symptoms and treatments.  Introduces Heart Failure Zone chart for action plan for heart failure. Written material  provided at class time.   Infection Prevention: - Provides verbal and written material to individual with discussion of infection control including proper hand washing and proper equipment cleaning during exercise session. Flowsheet Row Cardiac Rehab from 11/24/2023 in [facility] Cardiac and Pulmonary Rehab  Date 11/24/23  Educator NT  Instruction Review Code 1- Verbalizes Understanding    Falls Prevention: - Provides verbal and written material to individual with discussion of falls prevention and safety. Flowsheet Row Cardiac Rehab from 11/24/2023 in [facility] Cardiac and Pulmonary Rehab  Date 11/24/23  Educator NT  Instruction Review Code 1- Verbalizes Understanding    Other: -Provides group and verbal instruction on various topics (see comments)   Knowledge Questionnaire Score:  Knowledge Questionnaire Score - 11/24/23 0925       Knowledge Questionnaire Score   Pre Score 24/26          Core Components/Risk Factors/Patient Goals at Admission:  Personal Goals and Risk Factors at Admission - 11/18/23 1617       Core Components/Risk Factors/Patient Goals on Admission    Weight Management Yes;Weight Maintenance    Intervention Weight Management: Develop a combined nutrition and exercise program designed to reach desired caloric intake, while maintaining appropriate intake of nutrient and fiber, sodium and fats, and appropriate energy expenditure required for the weight goal.;Weight Management: Provide education and appropriate resources to help participant work on and attain dietary goals.;Weight Management/Obesity: Establish reasonable short term and long term weight goals.    Expected Outcomes Short Term: Continue to assess and modify interventions until short term weight is achieved;Long Term: Adherence to nutrition and physical activity/exercise program aimed toward attainment of established weight goal;Weight Maintenance: Understanding of the daily nutrition guidelines, which includes  25-35% calories from fat, 7% or less cal from saturated fats, less than 200mg  cholesterol, less than 1.5gm of sodium, & 5 or more servings of fruits and vegetables daily;Understanding recommendations for meals to include 15-35% energy as protein, 25-35% energy from fat, 35-60% energy from carbohydrates, less than 200mg  of dietary cholesterol, 20-35 gm of total fiber daily;Understanding of distribution of calorie intake throughout the day with the consumption of 4-5 meals/snacks    Diabetes Yes    Intervention Provide education about signs/symptoms and action to take for hypo/hyperglycemia.;Provide education about proper nutrition, including hydration, and aerobic/resistive exercise prescription along with prescribed medications to achieve blood glucose in normal ranges: Fasting glucose 65-99 mg/dL    Expected Outcomes Short Term: Participant verbalizes understanding of the signs/symptoms and immediate care of hyper/hypoglycemia, proper foot care and importance of medication, aerobic/resistive exercise and nutrition plan for blood glucose control.;Long Term: Attainment of HbA1C < 7%.    Hypertension Yes    Intervention Provide education on lifestyle modifcations including regular physical activity/exercise, weight management, moderate sodium restriction and increased consumption of fresh fruit, vegetables, and low fat dairy, alcohol moderation, and smoking cessation.;Monitor prescription use compliance.    Expected Outcomes Short Term: Continued assessment and intervention until BP is < 140/45mm HG in hypertensive participants. < 130/36mm HG in hypertensive participants with diabetes, heart failure or chronic kidney disease.;Long Term: Maintenance of blood pressure at goal levels.    Lipids Yes    Intervention Provide education and support for participant on nutrition & aerobic/resistive exercise along with prescribed medications to achieve LDL 70mg , HDL >40mg .    Expected Outcomes  Short Term: Participant  states understanding of desired cholesterol values and is compliant with medications prescribed. Participant is following exercise prescription and nutrition guidelines.;Long Term: Cholesterol controlled with medications as prescribed, with individualized exercise RX and with personalized nutrition plan. Value goals: LDL < 70mg , HDL > 40 mg.          Education:Diabetes - Individual verbal and written instruction to review signs/symptoms of diabetes, desired ranges of glucose level fasting, after meals and with exercise. Acknowledge that pre and post exercise glucose checks will be done for 3 sessions at entry of program. Flowsheet Row Cardiac Rehab from 04/24/2020 in [facility] Cardiac and Pulmonary Rehab  Date 03/18/20  Educator [facility]  Instruction Review Code 1- Verbalizes Understanding    Core Components/Risk Factors/Patient Goals Review:    Core Components/Risk Factors/Patient Goals at Discharge (Final Review):    ITP Comments:  ITP Comments     Row Name 11/18/23 1631 11/24/23 0922         ITP Comments Initial orientation completed. Diagnosis can be found in 8/28. EP orientation scheduled for Wednesday 9/17 at 8am. Completed and gym orientation for cardiac rehab. Initial ITP created and sent for review to Dr. Oneil Pinal, Medical Director.         Comments: Initial ITP

## 2023-11-25 ENCOUNTER — Encounter: Admitting: Emergency Medicine

## 2023-11-25 ENCOUNTER — Ambulatory Visit: Attending: Student | Admitting: Student

## 2023-11-25 ENCOUNTER — Encounter: Payer: Self-pay | Admitting: Student

## 2023-11-25 ENCOUNTER — Other Ambulatory Visit
Admission: RE | Admit: 2023-11-25 | Discharge: 2023-11-25 | Disposition: A | Discharge status: Home / Self Care | Source: Ambulatory Visit | Attending: Student | Admitting: Student

## 2023-11-25 VITALS — BP 136/70 | HR 66 | Ht 69.5 in | Wt 175.4 lb

## 2023-11-25 DIAGNOSIS — Z955 Presence of coronary angioplasty implant and graft: Secondary | ICD-10-CM

## 2023-11-25 DIAGNOSIS — I214 Non-ST elevation (NSTEMI) myocardial infarction: Secondary | ICD-10-CM | POA: Diagnosis not present

## 2023-11-25 DIAGNOSIS — I25708 Atherosclerosis of coronary artery bypass graft(s), unspecified, with other forms of angina pectoris: Secondary | ICD-10-CM | POA: Diagnosis not present

## 2023-11-25 DIAGNOSIS — E785 Hyperlipidemia, unspecified: Secondary | ICD-10-CM | POA: Insufficient documentation

## 2023-11-25 DIAGNOSIS — I10 Essential (primary) hypertension: Secondary | ICD-10-CM | POA: Diagnosis not present

## 2023-11-25 LAB — BASIC METABOLIC PANEL WITH GFR
Anion gap: 10 (ref 5–15)
BUN: 29 mg/dL — ABNORMAL HIGH (ref 8–23)
CO2: 25 mmol/L (ref 22–32)
Calcium: 9.6 mg/dL (ref 8.9–10.3)
Chloride: 103 mmol/L (ref 98–111)
Creatinine, Ser: 1.12 mg/dL (ref 0.61–1.24)
GFR, Estimated: >60 mL/min (ref >60)
Glucose, Bld: 60 mg/dL — ABNORMAL LOW (ref 70–99)
Potassium: 3.6 mmol/L (ref 3.5–5.1)
Sodium: 138 mmol/L (ref 135–145)

## 2023-11-25 LAB — CBC
HCT: 43 % (ref 39.0–52.0)
Hemoglobin: 14.5 g/dL (ref 13.0–17.0)
MCH: 28.7 pg (ref 26.0–34.0)
MCHC: 33.7 g/dL (ref 30.0–36.0)
MCV: 85.1 fL (ref 80.0–100.0)
Platelets: 279 K/uL (ref 150–400)
RBC: 5.05 MIL/uL (ref 4.22–5.81)
RDW: 14.2 % (ref 11.5–15.5)
WBC: 5.1 K/uL (ref 4.0–10.5)
nRBC: 0 % (ref 0.0–0.2)

## 2023-11-25 LAB — GLUCOSE, CAPILLARY
Glucose-Capillary: 158 mg/dL — ABNORMAL HIGH (ref 70–99)
Glucose-Capillary: 84 mg/dL (ref 70–99)

## 2023-11-25 NOTE — Progress Notes (Signed)
 Daily Session Note  Patient Details  Name: Luis Wise MRN: [id number] Date of Birth: 03-26-1940 Referring Provider:   Flowsheet Row Cardiac Rehab from 11/24/2023 in [facility] Cardiac and Pulmonary Rehab  Referring Provider Dr. Evalene Lunger, MD    Encounter Date: 11/25/2023  Check In:  Session Check In - 11/25/23 0929       Check-In   Supervising physician immediately available to respond to emergencies See telemetry face sheet for immediately available ER MD    Location [facility]-Cardiac & Pulmonary Rehab    Staff Present Fairy Plater RCP,RRT,BSRT;Shalin Vonbargen RN,BSN;Margaret Best, MS, Exercise Physiologist;Jason Elnor RDN,LDN    Virtual Visit No    Medication changes reported     No    Fall or balance concerns reported    No    Warm-up and Cool-down Performed on first and last piece of equipment    Resistance Training Performed Yes    VAD Patient? No    PAD/SET Patient? No      Pain Assessment   Currently in Pain? No/denies             Social History   Tobacco Use  Smoking Status Never  Smokeless Tobacco Never    Goals Met:  Independence with exercise equipment Exercise tolerated well No report of concerns or symptoms today Strength training completed today  Goals Unmet:  Not Applicable  Comments: First full day of exercise!  Patient was oriented to gym and equipment including functions, settings, policies, and procedures.  Patient's individual exercise prescription and treatment plan were reviewed.  All starting workloads were established based on the results of the 6 minute walk test done at initial orientation visit.  The plan for exercise progression was also introduced and progression will be customized based on patient's performance and goals.    Dr. Oneil Pinal is Medical Director for [facility] Cardiac Rehabilitation.  Dr. Fuad Aleskerov is Medical Director for [facility] Pulmonary Rehabilitation.

## 2023-11-25 NOTE — Patient Instructions (Signed)
 Medication Instructions:  Your physician recommends that you continue on your current medications as directed. Please refer to the Current Medication list given to you today.   *If you need a refill on your cardiac medications before your next appointment, please call your pharmacy*  Lab Work: Your provider would like for you to have following labs drawn today CBC, CMP.   If you have labs (blood work) drawn today and your tests are completely normal, you will receive your results only by: MyChart Message (if you have MyChart) OR A paper copy in the mail If you have any lab test that is abnormal or we need to change your treatment, we will call you to review the results.  Testing/Procedures: No test ordered today   Follow-Up: At [facility], you and your health needs are our priority.  As part of our continuing mission to provide you with exceptional heart care, our providers are all part of one team.  This team includes your primary Cardiologist (physician) and Advanced Practice Providers or APPs (Physician Assistants and Nurse Practitioners) who all work together to provide you with the care you need, when you need it.  Your next appointment:   3 month(s)  Provider:   You may see Timothy Gollan, MD or one of the following Advanced Practice Providers on your designated Care Team:   Lonni Meager, NP Lesley Maffucci, PA-C Bernardino Bring, PA-C Cadence Highspire, PA-C Tylene Lunch, NP Barnie Hila, NP

## 2023-11-26 ENCOUNTER — Ambulatory Visit: Payer: Self-pay | Admitting: Student

## 2023-11-26 NOTE — Progress Notes (Signed)
 Last read by [name] at 8:57AM on 11/26/2023.

## 2023-11-29 ENCOUNTER — Other Ambulatory Visit (HOSPITAL_COMMUNITY): Payer: Self-pay

## 2023-11-29 ENCOUNTER — Encounter

## 2023-11-29 DIAGNOSIS — I25708 Atherosclerosis of coronary artery bypass graft(s), unspecified, with other forms of angina pectoris: Secondary | ICD-10-CM | POA: Diagnosis not present

## 2023-11-29 DIAGNOSIS — Z955 Presence of coronary angioplasty implant and graft: Secondary | ICD-10-CM

## 2023-11-29 DIAGNOSIS — I214 Non-ST elevation (NSTEMI) myocardial infarction: Secondary | ICD-10-CM | POA: Diagnosis not present

## 2023-11-29 LAB — GLUCOSE, CAPILLARY
Glucose-Capillary: 108 mg/dL — ABNORMAL HIGH (ref 70–99)
Glucose-Capillary: 178 mg/dL — ABNORMAL HIGH (ref 70–99)

## 2023-11-29 NOTE — Progress Notes (Signed)
 Daily Session Note  Patient Details  Name: Luis Wise MRN: [id number] Date of Birth: 04/11/1940 Referring Provider:   Flowsheet Row Cardiac Rehab from 11/24/2023 in [facility] Cardiac and Pulmonary Rehab  Referring Provider Dr. Evalene Lunger, MD    Encounter Date: 11/29/2023  Check In:  Session Check In - 11/29/23 0933       Check-In   Supervising physician immediately available to respond to emergencies See telemetry face sheet for immediately available ER MD    Location [facility]-Cardiac & Pulmonary Rehab    Staff Present Burnard Davenport RN,BSN,MPA;[facility] BS, ACSM CEP, Exercise Physiologist;Jason Elnor RDN,LDN    Virtual Visit No    Medication changes reported     No    Fall or balance concerns reported    No    Warm-up and Cool-down Performed on first and last piece of equipment    Resistance Training Performed Yes    VAD Patient? No    PAD/SET Patient? No      Pain Assessment   Currently in Pain? No/denies             Social History   Tobacco Use  Smoking Status Never  Smokeless Tobacco Never    Goals Met:  Independence with exercise equipment Exercise tolerated well No report of concerns or symptoms today Strength training completed today  Goals Unmet:  Not Applicable  Comments: Pt able to follow exercise prescription today without complaint.  Will continue to monitor for progression.    Dr. Oneil Pinal is Medical Director for [facility] Cardiac Rehabilitation.  Dr. Fuad Aleskerov is Medical Director for [facility] Pulmonary Rehabilitation.

## 2023-11-30 DIAGNOSIS — Z23 Encounter for immunization: Secondary | ICD-10-CM | POA: Diagnosis not present

## 2023-12-01 ENCOUNTER — Encounter

## 2023-12-01 DIAGNOSIS — I25708 Atherosclerosis of coronary artery bypass graft(s), unspecified, with other forms of angina pectoris: Secondary | ICD-10-CM | POA: Diagnosis not present

## 2023-12-01 DIAGNOSIS — I214 Non-ST elevation (NSTEMI) myocardial infarction: Secondary | ICD-10-CM

## 2023-12-01 DIAGNOSIS — Z955 Presence of coronary angioplasty implant and graft: Secondary | ICD-10-CM

## 2023-12-01 LAB — GLUCOSE, CAPILLARY
Glucose-Capillary: 128 mg/dL — ABNORMAL HIGH (ref 70–99)
Glucose-Capillary: 81 mg/dL (ref 70–99)

## 2023-12-01 NOTE — Progress Notes (Signed)
 Cardiac Individual Treatment Plan  Patient Details  Name: Luis Wise MRN: [id number] Date of Birth: [birth year] Referring Provider:   Flowsheet Row Cardiac Rehab from 11/24/2023 in [facility] Cardiac and Pulmonary Rehab  Referring Provider Dr. Evalene Lunger, MD    Initial Encounter Date:  Flowsheet Row Cardiac Rehab from 11/24/2023 in [facility] Cardiac and Pulmonary Rehab  Date 11/24/23    Visit Diagnosis: Status post coronary artery stent placement  NSTEMI (non-ST elevated myocardial infarction) [facility])  Patient's Home Medications on Admission:  Current Outpatient Medications:    amLODipine  (NORVASC ) 10 MG tablet, Take 1 tablet (10 mg total) by mouth daily., Disp: 90 tablet, Rfl: 3   aspirin  EC 81 MG tablet, Take 81 mg by mouth daily. Swallow whole., Disp: , Rfl:    atorvastatin  (LIPITOR) 80 MG tablet, Take 1 tablet (80 mg total) by mouth daily., Disp: 90 tablet, Rfl: 3   carvedilol  (COREG ) 12.5 MG tablet, Take 1 tablet (12.5 mg total) by mouth 2 (two) times daily., Disp: 180 tablet, Rfl: 3   cholecalciferol  (VITAMIN D3) 25 MCG (1000 UNIT) tablet, Take 1,000 Units by mouth every evening., Disp: , Rfl:    clopidogrel  (PLAVIX ) 75 MG tablet, Take 1 tablet (75 mg total) by mouth daily with breakfast., Disp: 90 tablet, Rfl: 3   Coenzyme Q10 (COQ10) 100 MG CAPS, Take 100 mg by mouth every evening., Disp: , Rfl:    Continuous Blood Gluc Transmit (DEXCOM G6 TRANSMITTER) MISC, 1 Device by Does not apply route every 3 (three) months. E11.65 (Patient taking differently: 1 Device by Does not apply route every 3 (three) months. E11.65, G7), Disp: 1 each, Rfl: 3   cyanocobalamin  (VITAMIN B12) 1000 MCG tablet, Take 1,000 mcg by mouth daily., Disp: , Rfl:    dapagliflozin  propanediol (FARXIGA ) 10 MG TABS tablet, TAKE 1 TABLET BY MOUTH EVERY DAY BEFORE BREAKFAST, Disp: 90 tablet, Rfl: 3   fenofibrate  160 MG tablet, Take 1 tablet (160 mg total) by mouth daily., Disp: 90 tablet, Rfl: 3   glucose blood (ONETOUCH  ULTRA) test strip, Use to check blood sugar daily. Dx E11.9, Disp: 200 strip, Rfl: 3   GVOKE HYPOPEN  2-PACK 1 MG/0.2ML SOAJ, INJECT 0.2 MG UNDER SKIN AS NEEDED FOR LOW BLOOD SUGARS (Patient taking differently: as needed.), Disp: 0.4 mL, Rfl: PRN   Insulin  Disposable Pump (OMNIPOD 5 G6 PODS, GEN 5,) MISC, USE AS INSTRUCTED TO ADMINISTER INSULIN , Disp: 30 each, Rfl: 2   insulin  lispro (HUMALOG  KWIKPEN) 200 UNIT/ML KwikPen, USE AS INSTRUCTED IN THE INSULIN  PUMP MAX DAILY 145 UNITS, Disp: 72 mL, Rfl: 3   Insulin  Pen Needle 32G X 4 MM MISC, Use 4x a day, Disp: 300 each, Rfl: 3   MegaRed Omega-3 Krill Oil 350 MG CAPS, Take 350 mg by mouth daily., Disp: , Rfl:    metFORMIN  (GLUCOPHAGE ) 1000 MG tablet, TAKE 1 TABLET BY MOUTH TWICE A DAY (Patient taking differently: Take 1,500 mg by mouth every morning.), Disp: 180 tablet, Rfl: 3   Misc Natural Products (URINOZINC PO), Take 1 tablet by mouth 2 (two) times daily., Disp: , Rfl:    Multiple Vitamins-Minerals (PRESERVISION AREDS 2 PO), Take 1 tablet by mouth 2 (two) times daily. , Disp: , Rfl:    Multiple Vitamins-Minerals (ZINC  PO), Take 1 tablet by mouth daily., Disp: , Rfl:    nitroGLYCERIN  (NITROSTAT ) 0.4 MG SL tablet, Place 1 tablet (0.4 mg total) under the tongue every 5 (five) minutes as needed for chest pain., Disp: 25 tablet, Rfl: 1  sildenafil  (VIAGRA ) 100 MG tablet, Take 1 tablet (100 mg total) by mouth as needed for erectile dysfunction., Disp: 30 tablet, Rfl: 6   spironolactone  (ALDACTONE ) 25 MG tablet, Take 1 tablet (25 mg total) by mouth daily., Disp: 30 tablet, Rfl: 2   traZODone  (DESYREL ) 150 MG tablet, Take 1 tablet (150 mg total) by mouth daily., Disp: 90 tablet, Rfl: 3   valsartan  (DIOVAN ) 160 MG tablet, Take 1 tablet (160 mg total) by mouth daily., Disp: 90 tablet, Rfl: 3  Past Medical History: Past Medical History:  Diagnosis Date   Allergy    Amoxicillin   Cataract 2017   Mild progression   Coronary artery disease    a. s/p  5-vessel CABG (LIMA-LAD, SVG-diag, sequential SVG-OM1-OM 2, & SVG-PDA)   Diabetes mellitus with complication (HCC)    Hyperlipidemia    Hypertension    Insomnia    neuropathy    bilateral feet   NSTEMI (non-ST elevated myocardial infarction) (HCC) 11/03/2023   Postoperative atrial fibrillation (HCC)     Tobacco Use: Social History   Tobacco Use  Smoking Status Never  Smokeless Tobacco Never    Labs: Review Flowsheet  More data exists      Latest Ref Rng & Units 02/02/2023 06/03/2023 10/14/2023 11/04/2023 11/06/2023  Labs for ITP Cardiac and Pulmonary Rehab  Cholestrol 0 - 200 mg/dL 91  - - 94  -  LDL (calc) 0 - 99 mg/dL 49  - - 43  -  HDL-C >59 mg/dL 76.79  - - 25  -  Trlycerides <150 mg/dL 01.9  - - 869  -  Hemoglobin A1c 4.8 - 5.6 % - 5.5  5.8  - 5.6      Exercise Target Goals: Exercise Program Goal: Individual exercise prescription set using results from initial 6 min walk test and THRR while considering  patient's activity barriers and safety.   Exercise Prescription Goal: Initial exercise prescription builds to 30-45 minutes a day of aerobic activity, 2-3 days per week.  Home exercise guidelines will be given to patient during program as part of exercise prescription that the participant will acknowledge.   Education: Aerobic Exercise: - Group verbal and visual presentation on the components of exercise prescription. Introduces F.I.T.T principle from ACSM for exercise prescriptions.  Reviews F.I.T.T. principles of aerobic exercise including progression. Written material provided at class time. Flowsheet Row Cardiac Rehab from 04/24/2020 in [facility] Cardiac and Pulmonary Rehab  Date 04/24/20  Educator jh  Instruction Review Code 1- Verbalizes Understanding    Education: Resistance Exercise: - Group verbal and visual presentation on the components of exercise prescription. Introduces F.I.T.T principle from ACSM for exercise prescriptions  Reviews F.I.T.T. principles of  resistance exercise including progression. Written material provided at class time.    Education: Exercise & Equipment Safety: - Individual verbal instruction and demonstration of equipment use and safety with use of the equipment. Flowsheet Row Cardiac Rehab from 12/01/2023 in [facility] Cardiac and Pulmonary Rehab  Date 11/24/23  Educator NT  Instruction Review Code 1- Verbalizes Understanding    Education: Exercise Physiology & General Exercise Guidelines: - Group verbal and written instruction with models to review the exercise physiology of the cardiovascular system and associated critical values. Provides general exercise guidelines with specific guidelines to those with heart or lung disease. Written material provided at class time. Flowsheet Row Cardiac Rehab from 04/24/2020 in [facility] Cardiac and Pulmonary Rehab  Date 04/17/20  Educator [facility]  Instruction Review Code 1- Verbalizes Understanding  Education: Flexibility, Balance, Mind/Body Relaxation: - Group verbal and visual presentation with interactive activity on the components of exercise prescription. Introduces F.I.T.T principle from ACSM for exercise prescriptions. Reviews F.I.T.T. principles of flexibility and balance exercise training including progression. Also discusses the mind body connection.  Reviews various relaxation techniques to help reduce and manage stress (i.e. Deep breathing, progressive muscle relaxation, and visualization). Balance handout provided to take home. Written material provided at class time.   Activity Barriers & Risk Stratification:  Activity Barriers & Cardiac Risk Stratification - 11/18/23 1612       Activity Barriers & Cardiac Risk Stratification   Activity Barriers None    Cardiac Risk Stratification High          6 Minute Walk:  6 Minute Walk     Row Name 11/24/23 0928         6 Minute Walk   Distance 1350 feet     Walk Time 6 minutes     # of Rest Breaks 0     MPH 2.56     METS  2.49     RPE 12     Perceived Dyspnea  0     VO2 Peak 8.71     Symptoms No     Resting HR 66 bpm     Resting BP 132/64     Resting Oxygen Saturation  95 %     Exercise Oxygen Saturation  during 6 min walk 94 %     Max Ex. HR 83 bpm     Max Ex. BP 128/58     2 Minute Post BP 118/60        Oxygen Initial Assessment:   Oxygen Re-Evaluation:   Oxygen Discharge (Final Oxygen Re-Evaluation):   Initial Exercise Prescription:  Initial Exercise Prescription - 11/24/23 0900       Date of Initial Exercise RX and Referring Provider   Date 11/24/23    Referring Provider Dr. Timothy Gollan, MD      Oxygen   Maintain Oxygen Saturation 88% or higher      Treadmill   MPH 2.5    Grade 1    Minutes 15    METs 3.26      REL-XR   Level 3    Speed 50    Minutes 15    METs 2.49      T5 Nustep   Level 2    SPM 80    Minutes 15    METs 2.49      Prescription Details   Frequency (times per week) 3    Duration Progress to 30 minutes of continuous aerobic without signs/symptoms of physical distress      Intensity   THRR 40-80% of Max Heartrate 95-125    Ratings of Perceived Exertion 11-13    Perceived Dyspnea 0-4      Progression   Progression Continue to progress workloads to maintain intensity without signs/symptoms of physical distress.      Resistance Training   Training Prescription Yes    Weight 6 lb    Reps 10-15          Perform Capillary Blood Glucose checks as needed.  Exercise Prescription Changes:   Exercise Prescription Changes     Row Name 11/24/23 0900             Response to Exercise   Blood Pressure (Admit) 132/64       Blood Pressure (Exercise) 128/58  Blood Pressure (Exit) 118/60       Heart Rate (Admit) 66 bpm       Heart Rate (Exercise) 83 bpm       Heart Rate (Exit) 66 bpm       Oxygen Saturation (Admit) 95 %       Oxygen Saturation (Exercise) 94 %       Rating of Perceived Exertion (Exercise) 12       Perceived Dyspnea  (Exercise) 0       Symptoms none       Comments Results          Exercise Comments:   Exercise Comments     Row Name 11/25/23 0930           Exercise Comments First full day of exercise!  Patient was oriented to gym and equipment including functions, settings, policies, and procedures.  Patient's individual exercise prescription and treatment plan were reviewed.  All starting workloads were established based on the results of the 6 minute walk test done at initial orientation visit.  The plan for exercise progression was also introduced and progression will be customized based on patient's performance and goals.          Exercise Goals and Review:   Exercise Goals     Row Name 11/24/23 0929             Exercise Goals   Increase Physical Activity Yes       Intervention Provide advice, education, support and counseling about physical activity/exercise needs.;Develop an individualized exercise prescription for aerobic and resistive training based on initial evaluation findings, risk stratification, comorbidities and participant's personal goals.       Expected Outcomes Short Term: Attend rehab on a regular basis to increase amount of physical activity.;Long Term: Add in home exercise to make exercise part of routine and to increase amount of physical activity.;Long Term: Exercising regularly at least 3-5 days a week.       Increase Strength and Stamina Yes       Intervention Provide advice, education, support and counseling about physical activity/exercise needs.;Develop an individualized exercise prescription for aerobic and resistive training based on initial evaluation findings, risk stratification, comorbidities and participant's personal goals.       Expected Outcomes Short Term: Perform resistance training exercises routinely during rehab and add in resistance training at home;Short Term: Increase workloads from initial exercise prescription for resistance, speed, and  METs.;Long Term: Improve cardiorespiratory fitness, muscular endurance and strength as measured by increased METs and functional capacity ( )       Able to understand and use rate of perceived exertion (RPE) scale Yes       Intervention Provide education and explanation on how to use RPE scale       Expected Outcomes Short Term: Able to use RPE daily in rehab to express subjective intensity level;Long Term:  Able to use RPE to guide intensity level when exercising independently       Able to understand and use Dyspnea scale Yes       Intervention Provide education and explanation on how to use Dyspnea scale       Expected Outcomes Short Term: Able to use Dyspnea scale daily in rehab to express subjective sense of shortness of breath during exertion;Long Term: Able to use Dyspnea scale to guide intensity level when exercising independently       Knowledge and understanding of Target Heart Rate Range (THRR) Yes  Intervention Provide education and explanation of THRR including how the numbers were predicted and where they are located for reference       Expected Outcomes Short Term: Able to state/look up THRR;Short Term: Able to use daily as guideline for intensity in rehab;Long Term: Able to use THRR to govern intensity when exercising independently       Able to check pulse independently Yes       Intervention Provide education and demonstration on how to check pulse in carotid and radial arteries.;Review the importance of being able to check your own pulse for safety during independent exercise       Expected Outcomes Short Term: Able to explain why pulse checking is important during independent exercise;Long Term: Able to check pulse independently and accurately       Understanding of Exercise Prescription Yes       Intervention Provide education, explanation, and written materials on patient's individual exercise prescription       Expected Outcomes Short Term: Able to explain program  exercise prescription;Long Term: Able to explain home exercise prescription to exercise independently          Exercise Goals Re-Evaluation :  Exercise Goals Re-Evaluation     Row Name 11/25/23 0930             Exercise Goal Re-Evaluation   Exercise Goals Review Increase Physical Activity;Able to understand and use rate of perceived exertion (RPE) scale;Knowledge and understanding of Target Heart Rate Range (THRR);Understanding of Exercise Prescription;Increase Strength and Stamina;Able to check pulse independently;Able to understand and use Dyspnea scale       Comments Reviewed RPE and dyspnea scale, THR and program prescription with pt today.  Pt voiced understanding and was given a copy of goals to take home.       Expected Outcomes Short: Use RPE daily to regulate intensity.  Long: Follow program prescription in THR.          Discharge Exercise Prescription (Final Exercise Prescription Changes):  Exercise Prescription Changes - 11/24/23 0900       Response to Exercise   Blood Pressure (Admit) 132/64    Blood Pressure (Exercise) 128/58    Blood Pressure (Exit) 118/60    Heart Rate (Admit) 66 bpm    Heart Rate (Exercise) 83 bpm    Heart Rate (Exit) 66 bpm    Oxygen Saturation (Admit) 95 %    Oxygen Saturation (Exercise) 94 %    Rating of Perceived Exertion (Exercise) 12    Perceived Dyspnea (Exercise) 0    Symptoms none    Comments Results          Nutrition:  Target Goals: Understanding of nutrition guidelines, daily intake of sodium 1500mg , cholesterol 200mg , calories 30% from fat and 7% or less from saturated fats, daily to have 5 or more servings of fruits and vegetables.  Education: Nutrition 1 -Group instruction provided by verbal, written material, interactive activities, discussions, models, and posters to present general guidelines for heart healthy nutrition including macronutrients, label reading, and promoting whole foods over processed counterparts.  Education serves as Pensions consultant of discussion of heart healthy eating for all. Written material provided at class time.    Education: Nutrition 2 -Group instruction provided by verbal, written material, interactive activities, discussions, models, and posters to present general guidelines for heart healthy nutrition including sodium, cholesterol, and saturated fat. Providing guidance of habit forming to improve blood pressure, cholesterol, and body weight. Written material provided at class time.  Biometrics:  Pre Biometrics - 11/24/23 0930       Pre Biometrics   Height 5' 10.5 (1.791 m)    Weight 174 lb 14.4 oz (79.3 kg)    Waist Circumference 38 inches    Hip Circumference 37 inches    Waist to Hip Ratio 1.03 %    BMI (Calculated) 24.73    Single Leg Stand 25.9 seconds           Nutrition Therapy Plan and Nutrition Goals:  Nutrition Therapy & Goals - 11/24/23 0927       Nutrition Therapy   RD appointment deferred Yes      Intervention Plan   Intervention Prescribe, educate and counsel regarding individualized specific dietary modifications aiming towards targeted core components such as weight, hypertension, lipid management, diabetes, heart failure and other comorbidities.    Expected Outcomes Short Term Goal: Understand basic principles of dietary content, such as calories, fat, sodium, cholesterol and nutrients.;Short Term Goal: A plan has been developed with personal nutrition goals set during dietitian appointment.;Long Term Goal: Adherence to prescribed nutrition plan.          Nutrition Assessments:  MEDIFICTS Score Key: >=70 Need to make dietary changes  40-70 Heart Healthy Diet <= 40 Therapeutic Level Cholesterol Diet  Flowsheet Row Cardiac Rehab from 11/24/2023 in [facility] Cardiac and Pulmonary Rehab  Picture Your Plate Total Score on Admission 73   Picture Your Plate Scores: <59 Unhealthy dietary pattern with much room for improvement. 41-50 Dietary  pattern unlikely to meet recommendations for good health and room for improvement. 51-60 More healthful dietary pattern, with some room for improvement.  >60 Healthy dietary pattern, although there may be some specific behaviors that could be improved.    Nutrition Goals Re-Evaluation:   Nutrition Goals Discharge (Final Nutrition Goals Re-Evaluation):   Psychosocial: Target Goals: Acknowledge presence or absence of significant depression and/or stress, maximize coping skills, provide positive support system. Participant is able to verbalize types and ability to use techniques and skills needed for reducing stress and depression.   Education: Stress, Anxiety, and Depression - Group verbal and visual presentation to define topics covered.  Reviews how body is impacted by stress, anxiety, and depression.  Also discusses healthy ways to reduce stress and to treat/manage anxiety and depression. Written material provided at class time. Flowsheet Row Cardiac Rehab from 12/01/2023 in [facility] Cardiac and Pulmonary Rehab  Date 12/01/23  Educator kb  Instruction Review Code 1- [employer] Understanding    Education: Sleep Hygiene -Provides group verbal and written instruction about how sleep can affect your health.  Define sleep hygiene, discuss sleep cycles and impact of sleep habits. Review good sleep hygiene tips.   Initial Review & Psychosocial Screening:  Initial Psych Review & Screening - 11/18/23 1631       Initial Review   Current issues with None Identified      Family Dynamics   Good Support System? Yes      Barriers   Psychosocial barriers to participate in program There are no identifiable barriers or psychosocial needs.      Screening Interventions   Interventions Encouraged to exercise;To provide support and resources with identified psychosocial needs;Provide feedback about the scores to participant    Expected Outcomes Short Term goal: Utilizing psychosocial counselor, staff  and physician to assist with identification of specific Stressors or current issues interfering with healing process. Setting desired goal for each stressor or current issue identified.;Long Term Goal: Stressors or current issues are  controlled or eliminated.;Short Term goal: Identification and review with participant of any Quality of Life or Depression concerns found by scoring the questionnaire.;Long Term goal: The participant improves quality of Life and PHQ9 Scores as seen by post scores and/or verbalization of changes          Quality of Life Scores:   Quality of Life - 11/24/23 0925       Quality of Life   Select Quality of Life      Quality of Life Scores   Health/Function Pre 25.7 %    Socioeconomic Pre 24.5 %    Psych/Spiritual Pre 23.71 %    Family Pre 28.8 %    GLOBAL Pre 25.47 %         Scores of 19 and below usually indicate a poorer quality of life in these areas.  A difference of  2-3 points is a clinically meaningful difference.  A difference of 2-3 points in the total score of the Quality of Life Index has been associated with significant improvement in overall quality of life, self-image, physical symptoms, and general health in studies assessing change in quality of life.  PHQ-9: Review Flowsheet  More data exists      11/24/2023 11/09/2023 02/09/2023 08/06/2022 12/22/2021  Depression screen PHQ 2/9  Decreased Interest 0 0 0 0 0  Down, Depressed, Hopeless 0 0 0 0 0  PHQ - 2 Score 0 0 0 0 0  Altered sleeping 0 - 0 0 1  Tired, decreased energy 0 - 0 0 0  Change in appetite 0 - 0 0 0  Feeling bad or failure about yourself  0 - 0 0 0  Trouble concentrating 0 - 0 0 0  Moving slowly or fidgety/restless 0 - 0 0 0  Suicidal thoughts 0 - 0 0 -  PHQ-9 Score 0 - 0 0 1  Difficult doing work/chores - - Not difficult at all Not difficult at all Not difficult at all   Interpretation of Total Score  Total Score Depression Severity:  1-4 = Minimal depression, 5-9 = Mild  depression, 10-14 = Moderate depression, 15-19 = Moderately severe depression, 20-27 = Severe depression   Psychosocial Evaluation and Intervention:  Psychosocial Evaluation - 11/18/23 1632       Psychosocial Evaluation & Interventions   Comments Luis Wise is coming to cardiac rehab post NSTEMI and stent. [place] where he noticed his increase in symptoms. His [place] He reports not stress concerns at this time. His wife is a retired Engineer, civil (consulting) and helps with managing his heart healthy living.    Expected Outcomes Short: attend cardiac rehab for education and exercise Long: develop and maintain positive self care habits    Continue Psychosocial Services  Follow up required by staff          Psychosocial Re-Evaluation:   Psychosocial Discharge (Final Psychosocial Re-Evaluation):   Vocational Rehabilitation: Provide vocational rehab assistance to qualifying candidates.   Vocational Rehab Evaluation & Intervention:  Vocational Rehab - 11/18/23 1631       Initial Vocational Rehab Evaluation & Intervention   Assessment shows need for Vocational Rehabilitation No          Education: Education Goals: Education classes will be provided on a variety of topics geared toward better understanding of heart health and risk factor modification. Participant will  state understanding/return demonstration of topics presented as noted by education test scores.  Learning Barriers/Preferences:  Learning Barriers/Preferences - 11/18/23 1631       Learning Barriers/Preferences   Learning Barriers None    Learning Preferences None          General Cardiac Education Topics:  AED/CPR: - Group verbal and written instruction with the use of models to demonstrate the basic use of the AED with the basic ABC's of resuscitation.   Test and Procedures: - Group verbal and visual  presentation and models provide information about basic cardiac anatomy and function. Reviews the testing methods done to diagnose heart disease and the outcomes of the test results. Describes the treatment choices: Medical Management, Angioplasty, or Coronary Bypass Surgery for treating various heart conditions including Myocardial Infarction, Angina, Valve Disease, and Cardiac Arrhythmias. Written material provided at class time. Flowsheet Row Cardiac Rehab from 12/01/2023 in [facility] Cardiac and Pulmonary Rehab  Education need identified 11/24/23    Medication Safety: - Group verbal and visual instruction to review commonly prescribed medications for heart and lung disease. Reviews the medication, class of the drug, and side effects. Includes the steps to properly store meds and maintain the prescription regimen. Written material provided at class time. Flowsheet Row Cardiac Rehab from 04/24/2020 in [facility] Cardiac and Pulmonary Rehab  Date 03/27/20  Educator SB  Instruction Review Code 1- Verbalizes Understanding    Intimacy: - Group verbal instruction through game format to discuss how heart and lung disease can affect sexual intimacy. Written material provided at class time. Flowsheet Row Cardiac Rehab from 04/24/2020 in [facility] Cardiac and Pulmonary Rehab  Date 04/24/20  Educator jh  Instruction Review Code 1- Verbalizes Understanding    Know Your Numbers and Heart Failure: - Group verbal and visual instruction to discuss disease risk factors for cardiac and pulmonary disease and treatment options.  Reviews associated critical values for Overweight/Obesity, Hypertension, Cholesterol, and Diabetes.  Discusses basics of heart failure: signs/symptoms and treatments.  Introduces Heart Failure Zone chart for action plan for heart failure. Written material provided at class time.   Infection Prevention: - Provides verbal and written material to individual with discussion of infection control including  proper hand washing and proper equipment cleaning during exercise session. Flowsheet Row Cardiac Rehab from 12/01/2023 in [facility] Cardiac and Pulmonary Rehab  Date 11/24/23  Educator NT  Instruction Review Code 1- Verbalizes Understanding    Falls Prevention: - Provides verbal and written material to individual with discussion of falls prevention and safety. Flowsheet Row Cardiac Rehab from 12/01/2023 in [facility] Cardiac and Pulmonary Rehab  Date 11/24/23  Educator NT  Instruction Review Code 1- Verbalizes Understanding    Other: -Provides group and verbal instruction on various topics (see comments)   Knowledge Questionnaire Score:  Knowledge Questionnaire Score - 11/24/23 0925       Knowledge Questionnaire Score   Pre Score 24/26          Core Components/Risk Factors/Patient Goals at Admission:  Personal Goals and Risk Factors at Admission - 11/18/23 1617       Core Components/Risk Factors/Patient Goals on Admission    Weight Management Yes;Weight Maintenance    Intervention Weight Management: Develop a combined nutrition and exercise program designed to reach desired caloric intake, while maintaining appropriate intake of nutrient and fiber, sodium and fats, and appropriate energy expenditure required for the weight goal.;Weight Management: Provide education and appropriate resources to help participant work on and attain dietary goals.;Weight Management/Obesity: Establish reasonable  short term and long term weight goals.    Expected Outcomes Short Term: Continue to assess and modify interventions until short term weight is achieved;Long Term: Adherence to nutrition and physical activity/exercise program aimed toward attainment of established weight goal;Weight Maintenance: Understanding of the daily nutrition guidelines, which includes 25-35% calories from fat, 7% or less cal from saturated fats, less than 200mg  cholesterol, less than 1.5gm of sodium, & 5 or more servings of fruits  and vegetables daily;Understanding recommendations for meals to include 15-35% energy as protein, 25-35% energy from fat, 35-60% energy from carbohydrates, less than 200mg  of dietary cholesterol, 20-35 gm of total fiber daily;Understanding of distribution of calorie intake throughout the day with the consumption of 4-5 meals/snacks    Diabetes Yes    Intervention Provide education about signs/symptoms and action to take for hypo/hyperglycemia.;Provide education about proper nutrition, including hydration, and aerobic/resistive exercise prescription along with prescribed medications to achieve blood glucose in normal ranges: Fasting glucose 65-99 mg/dL    Expected Outcomes Short Term: Participant verbalizes understanding of the signs/symptoms and immediate care of hyper/hypoglycemia, proper foot care and importance of medication, aerobic/resistive exercise and nutrition plan for blood glucose control.;Long Term: Attainment of HbA1C < 7%.    Hypertension Yes    Intervention Provide education on lifestyle modifcations including regular physical activity/exercise, weight management, moderate sodium restriction and increased consumption of fresh fruit, vegetables, and low fat dairy, alcohol moderation, and smoking cessation.;Monitor prescription use compliance.    Expected Outcomes Short Term: Continued assessment and intervention until BP is < 140/5mm HG in hypertensive participants. < 130/57mm HG in hypertensive participants with diabetes, heart failure or chronic kidney disease.;Long Term: Maintenance of blood pressure at goal levels.    Lipids Yes    Intervention Provide education and support for participant on nutrition & aerobic/resistive exercise along with prescribed medications to achieve LDL 70mg , HDL >40mg .    Expected Outcomes Short Term: Participant states understanding of desired cholesterol values and is compliant with medications prescribed. Participant is following exercise prescription and  nutrition guidelines.;Long Term: Cholesterol controlled with medications as prescribed, with individualized exercise RX and with personalized nutrition plan. Value goals: LDL < 70mg , HDL > 40 mg.          Education:Diabetes - Individual verbal and written instruction to review signs/symptoms of diabetes, desired ranges of glucose level fasting, after meals and with exercise. Acknowledge that pre and post exercise glucose checks will be done for 3 sessions at entry of program. Flowsheet Row Cardiac Rehab from 04/24/2020 in [facility] Cardiac and Pulmonary Rehab  Date 03/18/20  Educator [facility]  Instruction Review Code 1- Verbalizes Understanding    Core Components/Risk Factors/Patient Goals Review:    Core Components/Risk Factors/Patient Goals at Discharge (Final Review):    ITP Comments:  ITP Comments     Row Name 11/18/23 1631 11/24/23 0922 11/25/23 0930 12/01/23 1150     ITP Comments Initial orientation completed. Diagnosis can be found in 8/28. EP orientation scheduled for Wednesday 9/17 at 8am. Completed and gym orientation for cardiac rehab. Initial ITP created and sent for review to Dr. Oneil Pinal, Medical Director. First full day of exercise!  Patient was oriented to gym and equipment including functions, settings, policies, and procedures.  Patient's individual exercise prescription and treatment plan were reviewed.  All starting workloads were established based on the results of the 6 minute walk test done at initial orientation visit.  The plan for exercise progression was also introduced and progression will be customized  based on patient's performance and goals. 30 Day review completed. Medical Director ITP review done; changes made as directed and signed approval by Medical Director. New to program.       Comments: 30 day review

## 2023-12-01 NOTE — Progress Notes (Signed)
 Daily Session Note  Patient Details  Name: Luis Wise MRN: [id number] Date of Birth: Apr 11, 1940 Referring Provider:   Flowsheet Row Cardiac Rehab from 11/24/2023 in [facility] Cardiac and Pulmonary Rehab  Referring Provider Dr. Evalene Lunger, MD    Encounter Date: 12/01/2023  Check In:  Session Check In - 12/01/23 0837       Check-In   Supervising physician immediately available to respond to emergencies See telemetry face sheet for immediately available ER MD    Location [facility]-Cardiac & Pulmonary Rehab    Staff Present Burnard Davenport [facility] Peggi, RN, DNP, NE-BC;Maxon Conetta BS, Exercise Physiologist;Joseph Rolinda NORWOOD HARMAN Hobert Best, MS, Exercise Physiologist    Virtual Visit No    Medication changes reported     No    Fall or balance concerns reported    No    Warm-up and Cool-down Performed on first and last piece of equipment    Resistance Training Performed Yes    VAD Patient? No    PAD/SET Patient? No      Pain Assessment   Currently in Pain? No/denies             Social History   Tobacco Use  Smoking Status Never  Smokeless Tobacco Never    Goals Met:  Independence with exercise equipment Exercise tolerated well No report of concerns or symptoms today Strength training completed today  Goals Unmet:  Not Applicable  Comments: Pt able to follow exercise prescription today without complaint.  Will continue to monitor for progression.    Dr. Oneil Pinal is Medical Director for [facility] Cardiac Rehabilitation.  Dr. Fuad Aleskerov is Medical Director for [facility] Pulmonary Rehabilitation.

## 2023-12-02 ENCOUNTER — Encounter: Admitting: Emergency Medicine

## 2023-12-02 ENCOUNTER — Other Ambulatory Visit (HOSPITAL_COMMUNITY): Payer: Self-pay

## 2023-12-02 DIAGNOSIS — Z955 Presence of coronary angioplasty implant and graft: Secondary | ICD-10-CM

## 2023-12-02 DIAGNOSIS — I25708 Atherosclerosis of coronary artery bypass graft(s), unspecified, with other forms of angina pectoris: Secondary | ICD-10-CM | POA: Diagnosis not present

## 2023-12-02 DIAGNOSIS — I214 Non-ST elevation (NSTEMI) myocardial infarction: Secondary | ICD-10-CM

## 2023-12-02 NOTE — Progress Notes (Signed)
 Daily Session Note  Patient Details  Name: Luis Wise MRN: [id number] Date of Birth: [birth year] Referring Provider:   Flowsheet Row Cardiac Rehab from 11/24/2023 in [facility] Cardiac and Pulmonary Rehab  Referring Provider Dr. Evalene Lunger, MD    Encounter Date: 12/02/2023  Check In:  Session Check In - 12/02/23 0934       Check-In   Supervising physician immediately available to respond to emergencies See telemetry face sheet for immediately available ER MD    Location [facility]-Cardiac & Pulmonary Rehab    Staff Present Selinda Pereyra RDN,LDN;Maxon Conetta BS, Exercise Physiologist;Joseph Rolinda RCP,RRT,BSRT;Eric Morganti RN,BSN    Virtual Visit No    Medication changes reported     No    Fall or balance concerns reported    No    Warm-up and Cool-down Performed on first and last piece of equipment    Resistance Training Performed Yes    VAD Patient? No    PAD/SET Patient? No      Pain Assessment   Currently in Pain? No/denies             Social History   Tobacco Use  Smoking Status Never  Smokeless Tobacco Never    Goals Met:  Independence with exercise equipment Exercise tolerated well No report of concerns or symptoms today Strength training completed today  Goals Unmet:  Not Applicable  Comments: Pt able to follow exercise prescription today without complaint.  Will continue to monitor for progression.    Dr. Oneil Pinal is Medical Director for [facility] Cardiac Rehabilitation.  Dr. Fuad Aleskerov is Medical Director for [facility] Pulmonary Rehabilitation.

## 2023-12-03 NOTE — Telephone Encounter (Signed)
 Last read by [name] at 7:47AM on 12/03/2023.  Last read by Emerick Holms at 7:46AM on 12/03/2023.

## 2023-12-06 ENCOUNTER — Encounter

## 2023-12-06 DIAGNOSIS — Z955 Presence of coronary angioplasty implant and graft: Secondary | ICD-10-CM

## 2023-12-06 DIAGNOSIS — I214 Non-ST elevation (NSTEMI) myocardial infarction: Secondary | ICD-10-CM

## 2023-12-06 DIAGNOSIS — I25708 Atherosclerosis of coronary artery bypass graft(s), unspecified, with other forms of angina pectoris: Secondary | ICD-10-CM | POA: Diagnosis not present

## 2023-12-06 NOTE — Progress Notes (Signed)
 Daily Session Note  Patient Details  Name: LAQUENTIN LOUDERMILK MRN: [id number] Date of Birth: November 26, 1940 Referring Provider:   Flowsheet Row Cardiac Rehab from 11/24/2023 in [facility] Cardiac and Pulmonary Rehab  Referring Provider Dr. Evalene Lunger, MD    Encounter Date: 12/06/2023  Check In:  Session Check In - 12/06/23 0914       Check-In   Supervising physician immediately available to respond to emergencies See telemetry face sheet for immediately available ER MD    Location [facility]-Cardiac & Pulmonary Rehab    Staff Present Burnard Davenport [facility] Peggi, RN, DNP, NE-BC;Joseph Hood RCP,RRT,BSRT;Maxon Conetta BS, Exercise Physiologist;Aleksa Catterton Dyane HECKLE, ACSM CEP, Exercise Physiologist    Virtual Visit No    Medication changes reported     No    Fall or balance concerns reported    No    Warm-up and Cool-down Performed on first and last piece of equipment    Resistance Training Performed Yes    VAD Patient? No    PAD/SET Patient? No      Pain Assessment   Currently in Pain? No/denies             Social History   Tobacco Use  Smoking Status Never  Smokeless Tobacco Never    Goals Met:  Independence with exercise equipment Exercise tolerated well No report of concerns or symptoms today Strength training completed today  Goals Unmet:  Not Applicable  Comments: Pt able to follow exercise prescription today without complaint.  Will continue to monitor for progression.    Dr. Oneil Pinal is Medical Director for [facility] Cardiac Rehabilitation.  Dr. Fuad Aleskerov is Medical Director for [facility] Pulmonary Rehabilitation.

## 2023-12-08 ENCOUNTER — Encounter: Attending: Cardiovascular Disease

## 2023-12-08 DIAGNOSIS — Z955 Presence of coronary angioplasty implant and graft: Secondary | ICD-10-CM | POA: Insufficient documentation

## 2023-12-08 DIAGNOSIS — I214 Non-ST elevation (NSTEMI) myocardial infarction: Secondary | ICD-10-CM | POA: Insufficient documentation

## 2023-12-08 NOTE — Progress Notes (Signed)
 Discharge Summary Patient: Rein Popov DOB: 1940/09/21  Aydyn graduated early, per his request, today from  rehab with 10 sessions completed.  Details of the patient's exercise prescription and what He needs to do in order to continue the prescription and progress were discussed with patient.  Patient was given a copy of prescription and goals.  Patient verbalized understanding. Tyshaun plans to continue to exercise by walking at home.   6 Minute Walk     Row Name 11/24/23 0928         6 Minute Walk   Distance 1350 feet     Walk Time 6 minutes     # of Rest Breaks 0     MPH 2.56     METS 2.49     RPE 12     Perceived Dyspnea  0     VO2 Peak 8.71     Symptoms No     Resting HR 66 bpm     Resting BP 132/64     Resting Oxygen Saturation  95 %     Exercise Oxygen Saturation  during 6 min walk 94 %     Max Ex. HR 83 bpm     Max Ex. BP 128/58     2 Minute Post BP 118/60

## 2023-12-08 NOTE — Progress Notes (Signed)
 Cardiac Individual Treatment Plan  Patient Details  Name: Luis Wise MRN: [id number] Date of Birth: [birth year] Referring Provider:   Flowsheet Row Cardiac Rehab from 11/24/2023 in [facility] Cardiac and Pulmonary Rehab  Referring Provider Dr. Evalene Lunger, MD    Initial Encounter Date:  Flowsheet Row Cardiac Rehab from 11/24/2023 in [facility] Cardiac and Pulmonary Rehab  Date 11/24/23    Visit Diagnosis: Status post coronary artery stent placement  NSTEMI (non-ST elevated myocardial infarction) [facility])  Patient's Home Medications on Admission:  Current Outpatient Medications:    amLODipine  (NORVASC ) 10 MG tablet, Take 1 tablet (10 mg total) by mouth daily., Disp: 90 tablet, Rfl: 3   aspirin  EC 81 MG tablet, Take 81 mg by mouth daily. Swallow whole., Disp: , Rfl:    atorvastatin  (LIPITOR) 80 MG tablet, Take 1 tablet (80 mg total) by mouth daily., Disp: 90 tablet, Rfl: 3   carvedilol  (COREG ) 12.5 MG tablet, Take 1 tablet (12.5 mg total) by mouth 2 (two) times daily., Disp: 180 tablet, Rfl: 3   cholecalciferol  (VITAMIN D3) 25 MCG (1000 UNIT) tablet, Take 1,000 Units by mouth every evening., Disp: , Rfl:    clopidogrel  (PLAVIX ) 75 MG tablet, Take 1 tablet (75 mg total) by mouth daily with breakfast., Disp: 90 tablet, Rfl: 3   Coenzyme Q10 (COQ10) 100 MG CAPS, Take 100 mg by mouth every evening., Disp: , Rfl:    Continuous Blood Gluc Transmit (DEXCOM G6 TRANSMITTER) MISC, 1 Device by Does not apply route every 3 (three) months. E11.65 (Patient taking differently: 1 Device by Does not apply route every 3 (three) months. E11.65, G7), Disp: 1 each, Rfl: 3   cyanocobalamin  (VITAMIN B12) 1000 MCG tablet, Take 1,000 mcg by mouth daily., Disp: , Rfl:    dapagliflozin  propanediol (FARXIGA ) 10 MG TABS tablet, TAKE 1 TABLET BY MOUTH EVERY DAY BEFORE BREAKFAST, Disp: 90 tablet, Rfl: 3   fenofibrate  160 MG tablet, Take 1 tablet (160 mg total) by mouth daily., Disp: 90 tablet, Rfl: 3   glucose blood (ONETOUCH  ULTRA) test strip, Use to check blood sugar daily. Dx E11.9, Disp: 200 strip, Rfl: 3   GVOKE HYPOPEN  2-PACK 1 MG/0.2ML SOAJ, INJECT 0.2 MG UNDER SKIN AS NEEDED FOR LOW BLOOD SUGARS (Patient taking differently: as needed.), Disp: 0.4 mL, Rfl: PRN   Insulin  Disposable Pump (OMNIPOD 5 G6 PODS, GEN 5,) MISC, USE AS INSTRUCTED TO ADMINISTER INSULIN , Disp: 30 each, Rfl: 2   insulin  lispro (HUMALOG  KWIKPEN) 200 UNIT/ML KwikPen, USE AS INSTRUCTED IN THE INSULIN  PUMP MAX DAILY 145 UNITS, Disp: 72 mL, Rfl: 3   Insulin  Pen Needle 32G X 4 MM MISC, Use 4x a day, Disp: 300 each, Rfl: 3   MegaRed Omega-3 Krill Oil 350 MG CAPS, Take 350 mg by mouth daily., Disp: , Rfl:    metFORMIN  (GLUCOPHAGE ) 1000 MG tablet, TAKE 1 TABLET BY MOUTH TWICE A DAY (Patient taking differently: Take 1,500 mg by mouth every morning.), Disp: 180 tablet, Rfl: 3   Misc Natural Products (URINOZINC PO), Take 1 tablet by mouth 2 (two) times daily., Disp: , Rfl:    Multiple Vitamins-Minerals (PRESERVISION AREDS 2 PO), Take 1 tablet by mouth 2 (two) times daily. , Disp: , Rfl:    Multiple Vitamins-Minerals (ZINC  PO), Take 1 tablet by mouth daily., Disp: , Rfl:    nitroGLYCERIN  (NITROSTAT ) 0.4 MG SL tablet, Place 1 tablet (0.4 mg total) under the tongue every 5 (five) minutes as needed for chest pain., Disp: 25 tablet, Rfl: 1  sildenafil  (VIAGRA ) 100 MG tablet, Take 1 tablet (100 mg total) by mouth as needed for erectile dysfunction., Disp: 30 tablet, Rfl: 6   spironolactone  (ALDACTONE ) 25 MG tablet, Take 1 tablet (25 mg total) by mouth daily., Disp: 30 tablet, Rfl: 2   traZODone  (DESYREL ) 150 MG tablet, Take 1 tablet (150 mg total) by mouth daily., Disp: 90 tablet, Rfl: 3   valsartan  (DIOVAN ) 160 MG tablet, Take 1 tablet (160 mg total) by mouth daily., Disp: 90 tablet, Rfl: 3  Past Medical History: Past Medical History:  Diagnosis Date   Allergy    Amoxicillin   Cataract 2017   Mild progression   Coronary artery disease    a. s/p  5-vessel CABG (LIMA-LAD, SVG-diag, sequential SVG-OM1-OM 2, & SVG-PDA)   Diabetes mellitus with complication (HCC)    Hyperlipidemia    Hypertension    Insomnia    neuropathy    bilateral feet   NSTEMI (non-ST elevated myocardial infarction) (HCC) 11/03/2023   Postoperative atrial fibrillation (HCC)     Tobacco Use: Social History   Tobacco Use  Smoking Status Never  Smokeless Tobacco Never    Labs: Review Flowsheet  More data exists      Latest Ref Rng & Units 02/02/2023 06/03/2023 10/14/2023 11/04/2023 11/06/2023  Labs for ITP Cardiac and Pulmonary Rehab  Cholestrol 0 - 200 mg/dL 91  - - 94  -  LDL (calc) 0 - 99 mg/dL 49  - - 43  -  HDL-C >59 mg/dL 76.79  - - 25  -  Trlycerides <150 mg/dL 01.9  - - 869  -  Hemoglobin A1c 4.8 - 5.6 % - 5.5  5.8  - 5.6      Exercise Target Goals: Exercise Program Goal: Individual exercise prescription set using results from initial 6 min walk test and THRR while considering  patient's activity barriers and safety.   Exercise Prescription Goal: Initial exercise prescription builds to 30-45 minutes a day of aerobic activity, 2-3 days per week.  Home exercise guidelines will be given to patient during program as part of exercise prescription that the participant will acknowledge.   Education: Aerobic Exercise: - Group verbal and visual presentation on the components of exercise prescription. Introduces F.I.T.T principle from ACSM for exercise prescriptions.  Reviews F.I.T.T. principles of aerobic exercise including progression. Written material provided at class time. Flowsheet Row Cardiac Rehab from 04/24/2020 in [facility] Cardiac and Pulmonary Rehab  Date 04/24/20  Educator jh  Instruction Review Code 1- Verbalizes Understanding    Education: Resistance Exercise: - Group verbal and visual presentation on the components of exercise prescription. Introduces F.I.T.T principle from ACSM for exercise prescriptions  Reviews F.I.T.T. principles of  resistance exercise including progression. Written material provided at class time.    Education: Exercise & Equipment Safety: - Individual verbal instruction and demonstration of equipment use and safety with use of the equipment. Flowsheet Row Cardiac Rehab from 12/02/2023 in [facility] Cardiac and Pulmonary Rehab  Date 11/24/23  Educator NT  Instruction Review Code 1- Verbalizes Understanding    Education: Exercise Physiology & General Exercise Guidelines: - Group verbal and written instruction with models to review the exercise physiology of the cardiovascular system and associated critical values. Provides general exercise guidelines with specific guidelines to those with heart or lung disease. Written material provided at class time. Flowsheet Row Cardiac Rehab from 04/24/2020 in [facility] Cardiac and Pulmonary Rehab  Date 04/17/20  Educator [facility]  Instruction Review Code 1- Verbalizes Understanding  Education: Flexibility, Balance, Mind/Body Relaxation: - Group verbal and visual presentation with interactive activity on the components of exercise prescription. Introduces F.I.T.T principle from ACSM for exercise prescriptions. Reviews F.I.T.T. principles of flexibility and balance exercise training including progression. Also discusses the mind body connection.  Reviews various relaxation techniques to help reduce and manage stress (i.e. Deep breathing, progressive muscle relaxation, and visualization). Balance handout provided to take home. Written material provided at class time.   Activity Barriers & Risk Stratification:  Activity Barriers & Cardiac Risk Stratification - 11/18/23 1612       Activity Barriers & Cardiac Risk Stratification   Activity Barriers None    Cardiac Risk Stratification High          6 Minute Walk:  6 Minute Walk     Row Name 11/24/23 0928         6 Minute Walk   Distance 1350 feet     Walk Time 6 minutes     # of Rest Breaks 0     MPH 2.56     METS  2.49     RPE 12     Perceived Dyspnea  0     VO2 Peak 8.71     Symptoms No     Resting HR 66 bpm     Resting BP 132/64     Resting Oxygen Saturation  95 %     Exercise Oxygen Saturation  during 6 min walk 94 %     Max Ex. HR 83 bpm     Max Ex. BP 128/58     2 Minute Post BP 118/60        Oxygen Initial Assessment:   Oxygen Re-Evaluation:   Oxygen Discharge (Final Oxygen Re-Evaluation):   Initial Exercise Prescription:  Initial Exercise Prescription - 11/24/23 0900       Date of Initial Exercise RX and Referring Provider   Date 11/24/23    Referring Provider Dr. Timothy Gollan, MD      Oxygen   Maintain Oxygen Saturation 88% or higher      Treadmill   MPH 2.5    Grade 1    Minutes 15    METs 3.26      REL-XR   Level 3    Speed 50    Minutes 15    METs 2.49      T5 Nustep   Level 2    SPM 80    Minutes 15    METs 2.49      Prescription Details   Frequency (times per week) 3    Duration Progress to 30 minutes of continuous aerobic without signs/symptoms of physical distress      Intensity   THRR 40-80% of Max Heartrate 95-125    Ratings of Perceived Exertion 11-13    Perceived Dyspnea 0-4      Progression   Progression Continue to progress workloads to maintain intensity without signs/symptoms of physical distress.      Resistance Training   Training Prescription Yes    Weight 6 lb    Reps 10-15          Perform Capillary Blood Glucose checks as needed.  Exercise Prescription Changes:   Exercise Prescription Changes     Row Name 11/24/23 0900 12/02/23 1400           Response to Exercise   Blood Pressure (Admit) 132/64 148/64      Blood Pressure (Exercise) 128/58 160/62  Blood Pressure (Exit) 118/60 130/60      Heart Rate (Admit) 66 bpm 63 bpm      Heart Rate (Exercise) 83 bpm 100 bpm      Heart Rate (Exit) 66 bpm 75 bpm      Oxygen Saturation (Admit) 95 % --      Oxygen Saturation (Exercise) 94 % --      Rating of  Perceived Exertion (Exercise) 12 11      Perceived Dyspnea (Exercise) 0 0      Symptoms none none      Comments Results 1st day of exercise      Duration -- Progress to 30 minutes of  aerobic without signs/symptoms of physical distress      Intensity -- THRR unchanged        Progression   Progression -- Continue to progress workloads to maintain intensity without signs/symptoms of physical distress.      Average METs -- 3.03        Resistance Training   Training Prescription -- Yes      Weight -- 6 lb      Reps -- 10-15        Interval Training   Interval Training -- No        Treadmill   MPH -- 2.5      Grade -- 1      Minutes -- 15      METs -- 3.26        T5 Nustep   Level -- 3      Minutes -- 15      METs -- 2.8        Oxygen   Maintain Oxygen Saturation -- 88% or higher         Exercise Comments:   Exercise Comments     Row Name 11/25/23 0930 12/08/23 9070         Exercise Comments First full day of exercise!  Patient was oriented to gym and equipment including functions, settings, policies, and procedures.  Patient's individual exercise prescription and treatment plan were reviewed.  All starting workloads were established based on the results of the 6 minute walk test done at initial orientation visit.  The plan for exercise progression was also introduced and progression will be customized based on patient's performance and goals. Cristino graduated early, per his request, today from  rehab with 10 sessions completed.  Details of the patient's exercise prescription and what He needs to do in order to continue the prescription and progress were discussed with patient.  Patient was given a copy of prescription and goals.  Patient verbalized understanding. Story plans to continue to exercise by walking at home.         Exercise Goals and Review:   Exercise Goals     Row Name 11/24/23 0929             Exercise Goals   Increase Physical Activity Yes        Intervention Provide advice, education, support and counseling about physical activity/exercise needs.;Develop an individualized exercise prescription for aerobic and resistive training based on initial evaluation findings, risk stratification, comorbidities and participant's personal goals.       Expected Outcomes Short Term: Attend rehab on a regular basis to increase amount of physical activity.;Long Term: Add in home exercise to make exercise part of routine and to increase amount of physical activity.;Long Term: Exercising regularly at least 3-5 days a week.  Increase Strength and Stamina Yes       Intervention Provide advice, education, support and counseling about physical activity/exercise needs.;Develop an individualized exercise prescription for aerobic and resistive training based on initial evaluation findings, risk stratification, comorbidities and participant's personal goals.       Expected Outcomes Short Term: Perform resistance training exercises routinely during rehab and add in resistance training at home;Short Term: Increase workloads from initial exercise prescription for resistance, speed, and METs.;Long Term: Improve cardiorespiratory fitness, muscular endurance and strength as measured by increased METs and functional capacity ( )       Able to understand and use rate of perceived exertion (RPE) scale Yes       Intervention Provide education and explanation on how to use RPE scale       Expected Outcomes Short Term: Able to use RPE daily in rehab to express subjective intensity level;Long Term:  Able to use RPE to guide intensity level when exercising independently       Able to understand and use Dyspnea scale Yes       Intervention Provide education and explanation on how to use Dyspnea scale       Expected Outcomes Short Term: Able to use Dyspnea scale daily in rehab to express subjective sense of shortness of breath during exertion;Long Term: Able to use Dyspnea scale to  guide intensity level when exercising independently       Knowledge and understanding of Target Heart Rate Range (THRR) Yes       Intervention Provide education and explanation of THRR including how the numbers were predicted and where they are located for reference       Expected Outcomes Short Term: Able to state/look up THRR;Short Term: Able to use daily as guideline for intensity in rehab;Long Term: Able to use THRR to govern intensity when exercising independently       Able to check pulse independently Yes       Intervention Provide education and demonstration on how to check pulse in carotid and radial arteries.;Review the importance of being able to check your own pulse for safety during independent exercise       Expected Outcomes Short Term: Able to explain why pulse checking is important during independent exercise;Long Term: Able to check pulse independently and accurately       Understanding of Exercise Prescription Yes       Intervention Provide education, explanation, and written materials on patient's individual exercise prescription       Expected Outcomes Short Term: Able to explain program exercise prescription;Long Term: Able to explain home exercise prescription to exercise independently          Exercise Goals Re-Evaluation :  Exercise Goals Re-Evaluation     Row Name 11/25/23 0930 12/02/23 1414           Exercise Goal Re-Evaluation   Exercise Goals Review Increase Physical Activity;Able to understand and use rate of perceived exertion (RPE) scale;Knowledge and understanding of Target Heart Rate Range (THRR);Understanding of Exercise Prescription;Increase Strength and Stamina;Able to check pulse independently;Able to understand and use Dyspnea scale Increase Physical Activity;Understanding of Exercise Prescription;Increase Strength and Stamina      Comments Reviewed RPE and dyspnea scale, THR and program prescription with pt today.  Pt voiced understanding and was given a  copy of goals to take home. Javious is off to a good start in the program and completed his first day of exercise in this review. He had a workload on the  treadmill of a speed of 2. and 1% incline. He worked at level 3 on the T5 nustep. We will continue to monitor (his/her) progress in the program.      Expected Outcomes Short: Use RPE daily to regulate intensity.  Long: Follow program prescription in THR. Short: Continue to follow current exercise prescription. Long: Continue exercise to improve strength and stamina.         Discharge Exercise Prescription (Final Exercise Prescription Changes):  Exercise Prescription Changes - 12/02/23 1400       Response to Exercise   Blood Pressure (Admit) 148/64    Blood Pressure (Exercise) 160/62    Blood Pressure (Exit) 130/60    Heart Rate (Admit) 63 bpm    Heart Rate (Exercise) 100 bpm    Heart Rate (Exit) 75 bpm    Rating of Perceived Exertion (Exercise) 11    Perceived Dyspnea (Exercise) 0    Symptoms none    Comments 1st day of exercise    Duration Progress to 30 minutes of  aerobic without signs/symptoms of physical distress    Intensity THRR unchanged      Progression   Progression Continue to progress workloads to maintain intensity without signs/symptoms of physical distress.    Average METs 3.03      Resistance Training   Training Prescription Yes    Weight 6 lb    Reps 10-15      Interval Training   Interval Training No      Treadmill   MPH 2.5    Grade 1    Minutes 15    METs 3.26      T5 Nustep   Level 3    Minutes 15    METs 2.8      Oxygen   Maintain Oxygen Saturation 88% or higher          Nutrition:  Target Goals: Understanding of nutrition guidelines, daily intake of sodium 1500mg , cholesterol 200mg , calories 30% from fat and 7% or less from saturated fats, daily to have 5 or more servings of fruits and vegetables.  Education: Nutrition 1 -Group instruction provided by verbal, written material,  interactive activities, discussions, models, and posters to present general guidelines for heart healthy nutrition including macronutrients, label reading, and promoting whole foods over processed counterparts. Education serves as Pensions consultant of discussion of heart healthy eating for all. Written material provided at class time.    Education: Nutrition 2 -Group instruction provided by verbal, written material, interactive activities, discussions, models, and posters to present general guidelines for heart healthy nutrition including sodium, cholesterol, and saturated fat. Providing guidance of habit forming to improve blood pressure, cholesterol, and body weight. Written material provided at class time.     Biometrics:  Pre Biometrics - 11/24/23 0930       Pre Biometrics   Height 5' 10.5 (1.791 m)    Weight 174 lb 14.4 oz (79.3 kg)    Waist Circumference 38 inches    Hip Circumference 37 inches    Waist to Hip Ratio 1.03 %    BMI (Calculated) 24.73    Single Leg Stand 25.9 seconds           Nutrition Therapy Plan and Nutrition Goals:  Nutrition Therapy & Goals - 11/24/23 0927       Nutrition Therapy   RD appointment deferred Yes      Intervention Plan   Intervention Prescribe, educate and counsel regarding individualized specific dietary modifications aiming towards targeted core components  such as weight, hypertension, lipid management, diabetes, heart failure and other comorbidities.    Expected Outcomes Short Term Goal: Understand basic principles of dietary content, such as calories, fat, sodium, cholesterol and nutrients.;Short Term Goal: A plan has been developed with personal nutrition goals set during dietitian appointment.;Long Term Goal: Adherence to prescribed nutrition plan.          Nutrition Assessments:  MEDIFICTS Score Key: >=70 Need to make dietary changes  40-70 Heart Healthy Diet <= 40 Therapeutic Level Cholesterol Diet  Flowsheet Row Cardiac Rehab  from 11/24/2023 in [facility] Cardiac and Pulmonary Rehab  Picture Your Plate Total Score on Admission 73   Picture Your Plate Scores: <59 Unhealthy dietary pattern with much room for improvement. 41-50 Dietary pattern unlikely to meet recommendations for good health and room for improvement. 51-60 More healthful dietary pattern, with some room for improvement.  >60 Healthy dietary pattern, although there may be some specific behaviors that could be improved.    Nutrition Goals Re-Evaluation:   Nutrition Goals Discharge (Final Nutrition Goals Re-Evaluation):   Psychosocial: Target Goals: Acknowledge presence or absence of significant depression and/or stress, maximize coping skills, provide positive support system. Participant is able to verbalize types and ability to use techniques and skills needed for reducing stress and depression.   Education: Stress, Anxiety, and Depression - Group verbal and visual presentation to define topics covered.  Reviews how body is impacted by stress, anxiety, and depression.  Also discusses healthy ways to reduce stress and to treat/manage anxiety and depression. Written material provided at class time. Flowsheet Row Cardiac Rehab from 12/02/2023 in [facility] Cardiac and Pulmonary Rehab  Date 12/02/23  Educator NT  Instruction Review Code 1- [employer] Understanding    Education: Sleep Hygiene -Provides group verbal and written instruction about how sleep can affect your health.  Define sleep hygiene, discuss sleep cycles and impact of sleep habits. Review good sleep hygiene tips.   Initial Review & Psychosocial Screening:  Initial Psych Review & Screening - 11/18/23 1631       Initial Review   Current issues with None Identified      Family Dynamics   Good Support System? Yes      Barriers   Psychosocial barriers to participate in program There are no identifiable barriers or psychosocial needs.      Screening Interventions   Interventions Encouraged  to exercise;To provide support and resources with identified psychosocial needs;Provide feedback about the scores to participant    Expected Outcomes Short Term goal: Utilizing psychosocial counselor, staff and physician to assist with identification of specific Stressors or current issues interfering with healing process. Setting desired goal for each stressor or current issue identified.;Long Term Goal: Stressors or current issues are controlled or eliminated.;Short Term goal: Identification and review with participant of any Quality of Life or Depression concerns found by scoring the questionnaire.;Long Term goal: The participant improves quality of Life and PHQ9 Scores as seen by post scores and/or verbalization of changes          Quality of Life Scores:   Quality of Life - 11/24/23 0925       Quality of Life   Select Quality of Life      Quality of Life Scores   Health/Function Pre 25.7 %    Socioeconomic Pre 24.5 %    Psych/Spiritual Pre 23.71 %    Family Pre 28.8 %    GLOBAL Pre 25.47 %         Scores  of 19 and below usually indicate a poorer quality of life in these areas.  A difference of  2-3 points is a clinically meaningful difference.  A difference of 2-3 points in the total score of the Quality of Life Index has been associated with significant improvement in overall quality of life, self-image, physical symptoms, and general health in studies assessing change in quality of life.  PHQ-9: Review Flowsheet  More data exists      11/24/2023 11/09/2023 02/09/2023 08/06/2022 12/22/2021  Depression screen PHQ 2/9  Decreased Interest 0 0 0 0 0  Down, Depressed, Hopeless 0 0 0 0 0  PHQ - 2 Score 0 0 0 0 0  Altered sleeping 0 - 0 0 1  Tired, decreased energy 0 - 0 0 0  Change in appetite 0 - 0 0 0  Feeling bad or failure about yourself  0 - 0 0 0  Trouble concentrating 0 - 0 0 0  Moving slowly or fidgety/restless 0 - 0 0 0  Suicidal thoughts 0 - 0 0 -  PHQ-9 Score 0 - 0 0 1   Difficult doing work/chores - - Not difficult at all Not difficult at all Not difficult at all   Interpretation of Total Score  Total Score Depression Severity:  1-4 = Minimal depression, 5-9 = Mild depression, 10-14 = Moderate depression, 15-19 = Moderately severe depression, 20-27 = Severe depression   Psychosocial Evaluation and Intervention:  Psychosocial Evaluation - 11/18/23 1632       Psychosocial Evaluation & Interventions   Comments Mr. Hagan is coming to cardiac rehab post NSTEMI and stent. [place] where he noticed his increase in symptoms. His [place]. He reports not stress concerns at this time. His wife is a retired Engineer, civil (consulting) and helps with managing his heart healthy living.    Expected Outcomes Short: attend cardiac rehab for education and exercise Long: develop and maintain positive self care habits    Continue Psychosocial Services  Follow up required by staff          Psychosocial Re-Evaluation:   Psychosocial Discharge (Final Psychosocial Re-Evaluation):   Vocational Rehabilitation: Provide vocational rehab assistance to qualifying candidates.   Vocational Rehab Evaluation & Intervention:  Vocational Rehab - 11/18/23 1631       Initial Vocational Rehab Evaluation & Intervention   Assessment shows need for Vocational Rehabilitation No          Education: Education Goals: Education classes will be provided on a variety of topics geared toward better understanding of heart health and risk factor modification. Participant will state understanding/return demonstration of topics presented as noted by education test scores.  Learning Barriers/Preferences:  Learning Barriers/Preferences - 11/18/23 1631       Learning Barriers/Preferences   Learning Barriers None    Learning Preferences None          General Cardiac Education  Topics:  AED/CPR: - Group verbal and written instruction with the use of models to demonstrate the basic use of the AED with the basic ABC's of resuscitation.   Test and Procedures: - Group verbal and visual presentation and models provide information about basic cardiac anatomy and function. Reviews the testing methods done to diagnose heart disease and the outcomes of the test results. Describes the treatment choices: Medical Management, Angioplasty, or Coronary Bypass Surgery for treating  various heart conditions including Myocardial Infarction, Angina, Valve Disease, and Cardiac Arrhythmias. Written material provided at class time. Flowsheet Row Cardiac Rehab from 12/02/2023 in [facility] Cardiac and Pulmonary Rehab  Education need identified 11/24/23    Medication Safety: - Group verbal and visual instruction to review commonly prescribed medications for heart and lung disease. Reviews the medication, class of the drug, and side effects. Includes the steps to properly store meds and maintain the prescription regimen. Written material provided at class time. Flowsheet Row Cardiac Rehab from 04/24/2020 in [facility] Cardiac and Pulmonary Rehab  Date 03/27/20  Educator SB  Instruction Review Code 1- Verbalizes Understanding    Intimacy: - Group verbal instruction through game format to discuss how heart and lung disease can affect sexual intimacy. Written material provided at class time. Flowsheet Row Cardiac Rehab from 04/24/2020 in [facility] Cardiac and Pulmonary Rehab  Date 04/24/20  Educator jh  Instruction Review Code 1- Verbalizes Understanding    Know Your Numbers and Heart Failure: - Group verbal and visual instruction to discuss disease risk factors for cardiac and pulmonary disease and treatment options.  Reviews associated critical values for Overweight/Obesity, Hypertension, Cholesterol, and Diabetes.  Discusses basics of heart failure: signs/symptoms and treatments.  Introduces Heart Failure  Zone chart for action plan for heart failure. Written material provided at class time.   Infection Prevention: - Provides verbal and written material to individual with discussion of infection control including proper hand washing and proper equipment cleaning during exercise session. Flowsheet Row Cardiac Rehab from 12/02/2023 in [facility] Cardiac and Pulmonary Rehab  Date 11/24/23  Educator NT  Instruction Review Code 1- Verbalizes Understanding    Falls Prevention: - Provides verbal and written material to individual with discussion of falls prevention and safety. Flowsheet Row Cardiac Rehab from 12/02/2023 in [facility] Cardiac and Pulmonary Rehab  Date 11/24/23  Educator NT  Instruction Review Code 1- Verbalizes Understanding    Other: -Provides group and verbal instruction on various topics (see comments)   Knowledge Questionnaire Score:  Knowledge Questionnaire Score - 11/24/23 0925       Knowledge Questionnaire Score   Pre Score 24/26          Core Components/Risk Factors/Patient Goals at Admission:  Personal Goals and Risk Factors at Admission - 11/18/23 1617       Core Components/Risk Factors/Patient Goals on Admission    Weight Management Yes;Weight Maintenance    Intervention Weight Management: Develop a combined nutrition and exercise program designed to reach desired caloric intake, while maintaining appropriate intake of nutrient and fiber, sodium and fats, and appropriate energy expenditure required for the weight goal.;Weight Management: Provide education and appropriate resources to help participant work on and attain dietary goals.;Weight Management/Obesity: Establish reasonable short term and long term weight goals.    Expected Outcomes Short Term: Continue to assess and modify interventions until short term weight is achieved;Long Term: Adherence to nutrition and physical activity/exercise program aimed toward attainment of established weight goal;Weight Maintenance:  Understanding of the daily nutrition guidelines, which includes 25-35% calories from fat, 7% or less cal from saturated fats, less than 200mg  cholesterol, less than 1.5gm of sodium, & 5 or more servings of fruits and vegetables daily;Understanding recommendations for meals to include 15-35% energy as protein, 25-35% energy from fat, 35-60% energy from carbohydrates, less than 200mg  of dietary cholesterol, 20-35 gm of total fiber daily;Understanding of distribution of calorie intake throughout the day with the consumption of 4-5 meals/snacks    Diabetes Yes  Intervention Provide education about signs/symptoms and action to take for hypo/hyperglycemia.;Provide education about proper nutrition, including hydration, and aerobic/resistive exercise prescription along with prescribed medications to achieve blood glucose in normal ranges: Fasting glucose 65-99 mg/dL    Expected Outcomes Short Term: Participant verbalizes understanding of the signs/symptoms and immediate care of hyper/hypoglycemia, proper foot care and importance of medication, aerobic/resistive exercise and nutrition plan for blood glucose control.;Long Term: Attainment of HbA1C < 7%.    Hypertension Yes    Intervention Provide education on lifestyle modifcations including regular physical activity/exercise, weight management, moderate sodium restriction and increased consumption of fresh fruit, vegetables, and low fat dairy, alcohol moderation, and smoking cessation.;Monitor prescription use compliance.    Expected Outcomes Short Term: Continued assessment and intervention until BP is < 140/62mm HG in hypertensive participants. < 130/8mm HG in hypertensive participants with diabetes, heart failure or chronic kidney disease.;Long Term: Maintenance of blood pressure at goal levels.    Lipids Yes    Intervention Provide education and support for participant on nutrition & aerobic/resistive exercise along with prescribed medications to achieve LDL  70mg , HDL >40mg .    Expected Outcomes Short Term: Participant states understanding of desired cholesterol values and is compliant with medications prescribed. Participant is following exercise prescription and nutrition guidelines.;Long Term: Cholesterol controlled with medications as prescribed, with individualized exercise RX and with personalized nutrition plan. Value goals: LDL < 70mg , HDL > 40 mg.          Education:Diabetes - Individual verbal and written instruction to review signs/symptoms of diabetes, desired ranges of glucose level fasting, after meals and with exercise. Acknowledge that pre and post exercise glucose checks will be done for 3 sessions at entry of program. Flowsheet Row Cardiac Rehab from 04/24/2020 in [facility] Cardiac and Pulmonary Rehab  Date 03/18/20  Educator [facility]  Instruction Review Code 1- Verbalizes Understanding    Core Components/Risk Factors/Patient Goals Review:    Core Components/Risk Factors/Patient Goals at Discharge (Final Review):    ITP Comments:  ITP Comments     Row Name 11/18/23 1631 11/24/23 0922 11/25/23 0930 12/01/23 1150 12/08/23 0929   ITP Comments Initial orientation completed. Diagnosis can be found in 8/28. EP orientation scheduled for Wednesday 9/17 at 8am. Completed and gym orientation for cardiac rehab. Initial ITP created and sent for review to Dr. Oneil Pinal, Medical Director. First full day of exercise!  Patient was oriented to gym and equipment including functions, settings, policies, and procedures.  Patient's individual exercise prescription and treatment plan were reviewed.  All starting workloads were established based on the results of the 6 minute walk test done at initial orientation visit.  The plan for exercise progression was also introduced and progression will be customized based on patient's performance and goals. 30 Day review completed. Medical Director ITP review done; changes made as directed and signed approval by  Medical Director. New to program. Norleen graduated early, per his request, today from  rehab with 10 sessions completed.  Details of the patient's exercise prescription and what He needs to do in order to continue the prescription and progress were discussed with patient.  Patient was given a copy of prescription and goals.  Patient verbalized understanding. Digby plans to continue to exercise by walking at home.      Comments: discharge ITP

## 2023-12-08 NOTE — Progress Notes (Signed)
 Daily Session Note  Patient Details  Name: Luis Wise MRN: [id number] Date of Birth: 1941/01/03 Referring Provider:   Flowsheet Row Cardiac Rehab from 11/24/2023 in [facility] Cardiac and Pulmonary Rehab  Referring Provider Dr. Evalene Lunger, MD    Encounter Date: 12/08/2023  Check In:  Session Check In - 12/08/23 0925       Check-In   Supervising physician immediately available to respond to emergencies See telemetry face sheet for immediately available ER MD    Location [facility]-Cardiac & Pulmonary Rehab    Staff Present Burnard [facility], RN, DNP, NE-BC;Joseph [facility] RN,BSN;Margaret Best, MS, Exercise Physiologist    Virtual Visit No    Medication changes reported     No    Fall or balance concerns reported    No    Warm-up and Cool-down Performed on first and last piece of equipment    Resistance Training Performed Yes    VAD Patient? No    PAD/SET Patient? No      Pain Assessment   Currently in Pain? No/denies             Social History   Tobacco Use  Smoking Status Never  Smokeless Tobacco Never    Goals Met:  Independence with exercise equipment Exercise tolerated well No report of concerns or symptoms today Strength training completed today  Goals Unmet:  Not Applicable  Comments:  Luis Wise graduated early, per his request, today from  rehab with 10 sessions completed.  Details of the patient's exercise prescription and what He needs to do in order to continue the prescription and progress were discussed with patient.  Patient was given a copy of prescription and goals.  Patient verbalized understanding. Luis Wise plans to continue to exercise by walking at home.    Dr. Oneil Pinal is Medical Director for [facility] Cardiac Rehabilitation.  Dr. Fuad Aleskerov is Medical Director for [facility] Pulmonary Rehabilitation.

## 2023-12-09 ENCOUNTER — Encounter

## 2023-12-10 ENCOUNTER — Other Ambulatory Visit: Payer: Self-pay | Admitting: Internal Medicine

## 2023-12-13 ENCOUNTER — Encounter

## 2023-12-15 ENCOUNTER — Encounter

## 2023-12-16 ENCOUNTER — Encounter

## 2023-12-20 ENCOUNTER — Ambulatory Visit

## 2023-12-22 ENCOUNTER — Ambulatory Visit

## 2023-12-22 ENCOUNTER — Other Ambulatory Visit: Payer: Self-pay | Admitting: Cardiovascular Disease

## 2023-12-23 ENCOUNTER — Ambulatory Visit

## 2023-12-23 MED ORDER — SPIRONOLACTONE 25 MG PO TABS: 25.0000 mg | ORAL_TABLET | Freq: Every day | ORAL | 2 refills | Status: AC

## 2023-12-27 ENCOUNTER — Encounter

## 2023-12-29 ENCOUNTER — Ambulatory Visit

## 2023-12-30 ENCOUNTER — Encounter

## 2024-01-03 ENCOUNTER — Encounter

## 2024-01-05 ENCOUNTER — Encounter

## 2024-01-06 ENCOUNTER — Encounter

## 2024-01-10 ENCOUNTER — Encounter

## 2024-01-12 ENCOUNTER — Encounter

## 2024-01-13 ENCOUNTER — Encounter

## 2024-01-13 ENCOUNTER — Encounter: Payer: Self-pay | Admitting: Internal Medicine

## 2024-01-17 ENCOUNTER — Ambulatory Visit

## 2024-01-19 ENCOUNTER — Encounter

## 2024-01-19 ENCOUNTER — Other Ambulatory Visit: Payer: Self-pay | Admitting: Family Medicine

## 2024-01-19 DIAGNOSIS — E782 Mixed hyperlipidemia: Secondary | ICD-10-CM

## 2024-01-20 ENCOUNTER — Ambulatory Visit

## 2024-01-24 ENCOUNTER — Encounter

## 2024-01-26 ENCOUNTER — Encounter

## 2024-01-27 ENCOUNTER — Other Ambulatory Visit (INDEPENDENT_AMBULATORY_CARE_PROVIDER_SITE_OTHER)

## 2024-01-27 ENCOUNTER — Ambulatory Visit

## 2024-01-27 DIAGNOSIS — E782 Mixed hyperlipidemia: Secondary | ICD-10-CM | POA: Diagnosis not present

## 2024-01-27 LAB — HEPATIC FUNCTION PANEL
ALT: 24 U/L (ref 0–53)
AST: 23 U/L (ref 0–37)
Albumin: 4.9 g/dL (ref 3.5–5.2)
Alkaline Phosphatase: 33 U/L — ABNORMAL LOW (ref 39–117)
Bilirubin, Direct: 0.1 mg/dL (ref 0.0–0.3)
Total Bilirubin: 0.6 mg/dL (ref 0.2–1.2)
Total Protein: 7.1 g/dL (ref 6.0–8.3)

## 2024-01-27 LAB — TSH: TSH: 1.69 u[IU]/mL (ref 0.35–5.50)

## 2024-01-29 ENCOUNTER — Other Ambulatory Visit: Payer: Self-pay | Admitting: Family Medicine

## 2024-01-29 DIAGNOSIS — G4700 Insomnia, unspecified: Secondary | ICD-10-CM

## 2024-01-29 DIAGNOSIS — E1159 Type 2 diabetes mellitus with other circulatory complications: Secondary | ICD-10-CM

## 2024-01-30 ENCOUNTER — Ambulatory Visit: Payer: Self-pay | Admitting: Family Medicine

## 2024-01-31 ENCOUNTER — Ambulatory Visit

## 2024-02-02 ENCOUNTER — Ambulatory Visit

## 2024-02-07 ENCOUNTER — Encounter

## 2024-02-08 ENCOUNTER — Ambulatory Visit: Admitting: Family Medicine

## 2024-02-08 ENCOUNTER — Encounter: Payer: Self-pay | Admitting: Family Medicine

## 2024-02-08 ENCOUNTER — Ambulatory Visit

## 2024-02-08 VITALS — BP 140/64 | HR 56 | Temp 98.1°F | Ht 69.5 in | Wt 181.1 lb

## 2024-02-08 DIAGNOSIS — R351 Nocturia: Secondary | ICD-10-CM | POA: Diagnosis not present

## 2024-02-08 DIAGNOSIS — G4700 Insomnia, unspecified: Secondary | ICD-10-CM

## 2024-02-08 DIAGNOSIS — I2510 Atherosclerotic heart disease of native coronary artery without angina pectoris: Secondary | ICD-10-CM | POA: Diagnosis not present

## 2024-02-08 DIAGNOSIS — Z7189 Other specified counseling: Secondary | ICD-10-CM

## 2024-02-08 DIAGNOSIS — E785 Hyperlipidemia, unspecified: Secondary | ICD-10-CM | POA: Diagnosis not present

## 2024-02-08 DIAGNOSIS — I10 Essential (primary) hypertension: Secondary | ICD-10-CM

## 2024-02-08 DIAGNOSIS — Z0000 Encounter for general adult medical examination without abnormal findings: Secondary | ICD-10-CM

## 2024-02-08 MED ORDER — T: SLIM X2 INS PMP/CONTROL 7.4 DEVI: Status: AC

## 2024-02-08 MED ORDER — METFORMIN HCL 1000 MG PO TABS: 1500.0000 mg | ORAL_TABLET | Freq: Every morning | ORAL | Status: AC

## 2024-02-08 MED ORDER — TRAZODONE HCL 150 MG PO TABS: 75.0000 mg | ORAL_TABLET | Freq: Every day | ORAL | 3 refills | Status: AC

## 2024-02-08 NOTE — Progress Notes (Unsigned)
 Tetanus 2023 Flu shot 2025 covid vaccine can be done out of clinic.  PNA up to date.  Shingles 2020.  RSV vaccine prev done Colon cancer screening deferred at this point.  Advance directive discussed with patient.  Wife designated if patient were incapacitated.   CAD.  Hypertension:    Using medication without problems or lightheadedness: yes Chest pain with exertion:no Edema:no Short of breath:no Recent labs d/w pt.   Elevated Cholesterol: Using medications without problems:yes Muscle aches: no consistent aches.   Diet compliance: yes Exercise: yes  Episodic mild rash on the upper back but not present today.  I asked him to update me as needed.  Insomnia improved with trazodone . No ADE on med.   D/w pt about aspirin  vs plavix . Cards rec was to continue both.    Nocturia mult times per night.  No burning.  Longstanding.  I asked him to update me as needed.  Would be okay to defer w/u at this point.  No FH prostate cancer.    DM2 per endo.  I will defer.  He agrees.  Meds, vitals, and allergies reviewed.   PMH and SH reviewed  ROS: Per HPI unless specifically indicated in ROS section   GEN: nad, alert and oriented HEENT: mucous membranes moist NECK: supple w/o LA CV: rrr. PULM: ctab, no inc wob ABD: soft, +bs EXT: no edema SKIN: Well-perfused.

## 2024-02-08 NOTE — Patient Instructions (Addendum)
 If you have more urinary symptoms then let me know.   Update me as needed.  Take care.  Glad to see you.

## 2024-02-09 ENCOUNTER — Ambulatory Visit

## 2024-02-09 DIAGNOSIS — R351 Nocturia: Secondary | ICD-10-CM | POA: Insufficient documentation

## 2024-02-09 NOTE — Assessment & Plan Note (Signed)
 See coronary artery disease discussion.  No change in meds.

## 2024-02-09 NOTE — Assessment & Plan Note (Signed)
 Not having chest pain.  Continue amlodipine  aspirin  Plavix  Lipitor carvedilol  Farxiga  fenofibrate  spironolactone  and valsartan .

## 2024-02-09 NOTE — Assessment & Plan Note (Signed)
Advance directive discussed with patient.  Wife designated if patient were incapacitated. 

## 2024-02-09 NOTE — Assessment & Plan Note (Signed)
 Nocturia mult times per night.  No burning.  Longstanding.  I asked him to update me as needed.  Would be okay to defer w/u at this point.  No FH prostate cancer.     It may be more hazardous to follow-up with prostate biopsy if he had an abnormal PSA in the first place.  Therefore, would defer.  He can update me as needed.

## 2024-02-09 NOTE — Assessment & Plan Note (Signed)
Continue Lipitor and fenofibrate.

## 2024-02-09 NOTE — Assessment & Plan Note (Signed)
 Tetanus 2023 Flu shot 2025 covid vaccine can be done out of clinic.  PNA up to date.  Shingles 2020.  RSV vaccine prev done Colon cancer screening deferred at this point.  Advance directive discussed with patient.  Wife designated if patient were incapacitated.

## 2024-02-09 NOTE — Assessment & Plan Note (Signed)
Continue trazodone as needed. 

## 2024-02-10 ENCOUNTER — Encounter

## 2024-02-11 ENCOUNTER — Telehealth: Payer: Self-pay | Admitting: Family Medicine

## 2024-02-11 NOTE — Telephone Encounter (Signed)
 Neither was sent.  When we update the med list in the EMR (as was done at the visit), the EMR puts that message on the AVS.  The program was designed that way.

## 2024-02-11 NOTE — Telephone Encounter (Signed)
 Copied from CRM #[id number]. Topic: Clinical - Medication Question >> Feb 10, 2024  3:34 PM Luis Wise wrote: Reason for CRM: Patient called to inquiry where his metFORMIN  (GLUCOPHAGE ) 1000 MG tablet  and Insulin  Infusion Pump (T: SLIM X2 INS PMP/CONTROL 7.4) was sent per patient after visit summary states Ask your doctor where to pick these up but doesn't provider any additional information.    Please contact patient to further advises CB#[phone] (M)

## 2024-02-14 ENCOUNTER — Ambulatory Visit

## 2024-02-14 NOTE — Telephone Encounter (Signed)
 Spoke with pt. Informed him that the EMR puts that message on the AVS. Nothing further was needed.

## 2024-02-16 ENCOUNTER — Encounter

## 2024-02-16 ENCOUNTER — Encounter: Payer: Self-pay | Admitting: Internal Medicine

## 2024-02-17 ENCOUNTER — Encounter

## 2024-02-17 ENCOUNTER — Ambulatory Visit: Admitting: Internal Medicine

## 2024-02-17 ENCOUNTER — Encounter: Payer: Self-pay | Admitting: Internal Medicine

## 2024-02-17 VITALS — BP 140/70 | HR 60 | Ht 69.5 in | Wt 179.8 lb

## 2024-02-17 DIAGNOSIS — Z794 Long term (current) use of insulin: Secondary | ICD-10-CM | POA: Diagnosis not present

## 2024-02-17 DIAGNOSIS — E785 Hyperlipidemia, unspecified: Secondary | ICD-10-CM

## 2024-02-17 DIAGNOSIS — E1159 Type 2 diabetes mellitus with other circulatory complications: Secondary | ICD-10-CM

## 2024-02-17 LAB — POCT GLYCOSYLATED HEMOGLOBIN (HGB A1C): Hemoglobin A1C: 5.6 % (ref 4.0–5.6)

## 2024-02-17 NOTE — Patient Instructions (Addendum)
 Please continue: - Farxiga  10 mg before b'fast - Metformin  1000 mg in am  Use the following pump settings: - Basal rates: 12 am: 1.8 6 am: 1.8                                     5 pm: 1.7 - Insulin  to carb ratio: 12 am: 1:8 6 am: 1:6.5 >> 1:6 12 pm: 1:6 5 pm: 1:9 >> 1:8.5 - Target: 12 am: 110 - Correction factor (insulin  sensitivity factor):  12 am: 80 6 am: 70   Please return in 4-6 months.

## 2024-02-17 NOTE — Progress Notes (Signed)
 Cardiology Clinic Note   Date: 02/21/2024 ID: ALBERTUS CHIARELLI, DOB 02/19/1941, MRN [id number]  Primary Cardiologist:  Evalene Lunger, MD  Chief Complaint   Luis Wise is a 83 y.o. male who presents to the clinic today for routine follow up.   Patient Profile   Luis Wise is followed by Dr. Gollan for the history outlined below.      Past medical history significant for: CAD. LHC 01/19/2020 (abnormal coronary CTA): Proximal RCA 60%.  Distal RCA 50%.  RPDA 50%.  Mid RCA 70%.  OM2 80%.  Distal LCx 80%.  Proximal to mid LAD 100%.  D1 80%.  Proximal LAD 70%.  Severe heavily calcified three-vessel CAD.  Chronic occlusion of proximal to mid LAD with bridging collaterals as well as faint right to left collaterals.  Mildly elevated LVEDP.  Normal LV systolic function.  Recommend CT consult. CABG x 5 01/30/2020: LIMA to LAD, SVG to diagonal, sequential SVG to OM1 and OM 2, SVG to posterior descending. LHC 11/04/2023 (angina): Proximal LAD 70%.  Proximal to mid LAD 100%.  D1 80%.  OM 2 100%.  Distal LCx 80%.  Proximal RCA 60%.  Mid RCA #1 90%, #2 60%.  Mid to distal RCA 50%.  RPDA 50%.  Patent LIMA to LAD.  Sequential SVG to OM 2 and OM 3 patent.  SVG to RPDA with 100% origin lesion. Staged PCI 11/05/2023: Successful CSI orbital atherectomy and DES 3 x 12 mm to mid and distal RCA and ostium of PDA.  PCI with DES 3.5 x 26 mm to distal RCA.  DES 3.5 x 34 mm to mid RCA. Echo 11/06/2023: EF 60 to 65%.  No RWMA.  Mild concentric LVH.  Grade I DD.  Abnormal (paradoxical) septal motion consistent with postoperative status.  Normal RV size/function.  Aortic valve with indeterminate number of cusps.  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 Postop PAF. Carotid artery disease. Carotid ultrasound 04/08/2023: Bilateral ICA 1 to 39%. Hypertension. Hyperlipidemia. LPa 11/04/2023: 53.9. Lipid panel 11/04/2023: LDL 43, HDL 25, TG 130, total 94. T2DM.  In summary, patient was first evaluated by Dr. Gollan on  01/02/2020 for chest pain after ED visit. BP was noted to be significantly elevated with SBP in the 190s.  He was noted to have an LBBB.  Initial troponin 47 with delta of 44.  He was started on isosorbide  and underwent coronary CTA which demonstrated calcium  score of 5706 with heavily calcified coronary arteries causing significant/severe stenosis of greater than 70% in the mid LAD, mid RCA, and ostial OM1.  FFR was significant in the mid LAD.  He underwent LHC which showed severe heavily calcified three-vessel CAD with CTO of proximal to mid LAD with bridging collaterals and faint right to left collaterals.  He was referred to CT surgery and underwent CABG x 5 on 01/30/2020.  Preoperative echo demonstrated EF 55 to 60% as detailed above.  His operative course was complicated by A-fib treated with amiodarone  with subsequent conversion to sinus rhythm.  Upon transferring to the floor he redeveloped A-fib briefly and converted to sinus rhythm with oral medications.  Patient was last seen in the office by Dr. Gollan on 09/07/2023 for routine follow-up.  He was doing well at that time.  He reported walking 3 miles in the park 3 times a [place]  He was hypertensive with initial blood pressure with some improvement on recheck.  He was instructed  to monitor BP at home.  Patient contacted the office on 11/01/2023 with complaints of chest pain.  Per triage RN The patient called to report experiencing chest pain a couple of times in the morning over the weekend. He stated the symptoms occurred upon waking and described the pain as a tightness that radiated across his chest. The patient was unable to identify any precipitating factors but noted that the symptoms relieved with Motrin. He denies any other symptoms during that time and currently denies chest pain. Additionally, the patient reported that his blood pressure has been within normal limits.   Patient was seen in clinic  on 11/01/2023 for evaluation of chest pain.  Patient described 3 days of chest tightness that felt like a weight on his chest that began upon awakening and was more evident with exertion and eased with rest.  He reported another episode the night previous and his wife consider taking him to the ED but he was able to go to sleep.  He was pain-free at the time of his visit.  Given anginal equivalent he was scheduled for LHC and Rx'd as needed SL NTG.  Patient continued to have episodic chest pain prior to his scheduled LHC taking NTG x 9 over 3 days.  He was instructed to present to the ED for accelerating chest pain.  Patient presented to the ED on 11/03/2023 (the day prior to scheduled heart catheterization) for worsening chest pain.  Patient was admitted.  Troponin 112>> 118>> 127>> 143.  LHC demonstrated occluded SVG to RPDA and heavily calcified RCA with tandem lesions as detailed above.  Patient was transferred to [facility] for staged high risk PCI.  He underwent staged PCI on 11/05/2023 with orbital arthrectomy and DES x 3 as detailed above.  Echo demonstrated normal LV/RV function.  Patient was discharged on 11/06/2023.   Patient was last seen in the office by me on 11/25/2023 for hospital follow-up.  He was doing well and participating in cardiac rehab.  No medication changes were made.     History of Present Illness    Today, patient is accompanied by his wife. He is doing very well. He stopped going to cardiac rehab before graduating as he felt he would do well exercising on his own. He is back to walking 2.25 miles 2-3 days a week weather permitting. Patient denies shortness of breath, dyspnea on exertion, lower extremity edema, orthopnea or PND. No chest pain, pressure, or tightness. No palpitations. His BP is elevated today. Home BP typically >140 systolic.     ROS: All other systems reviewed and are otherwise negative except as noted in History of Present Illness.  EKGs/Labs Reviewed        EKG is not performed today.   11/25/2023: BUN 29; Creatinine, Ser 1.12; Potassium 3.6; Sodium 138 01/27/2024: ALT 24; AST 23   11/25/2023: Hemoglobin 14.5; WBC 5.1   01/27/2024: TSH 1.69   Risk Assessment/Calculations      HYPERTENSION CONTROL Vitals:   02/21/24 1032 02/21/24 1232  BP: (!) 164/79 (!) 142/64    The patient's blood pressure is elevated above target today.  In order to address the patient's elevated BP: A referral to the [facility] Hypertension Clinic will be placed.           Physical Exam    VS:  BP (!) 142/64 (BP Location: Left Arm, Patient Position: Sitting, Cuff Size: Normal)   Pulse 64   Ht 5' 9.5 (1.765 m)   Wt 182  lb (82.6 kg)   SpO2 98%   BMI 26.49 kg/m  , BMI Body mass index is 26.49 kg/m.  GEN: Well nourished, well developed, in no acute distress. Neck: No JVD or carotid bruits. Cardiac:  RRR.  No murmur. No rubs or gallops.   Respiratory:  Respirations regular and unlabored. Clear to auscultation without rales, wheezing or rhonchi. GI: Soft, nontender, nondistended. Extremities: Radials/DP/PT 2+ and equal bilaterally. No clubbing or cyanosis. No edema   Skin: Warm and dry, no rash. Neuro: Strength intact.  Assessment & Plan   CAD S/p CABG x 12 January 2020.  Echo demonstrated EF 55 to 60%, mild hypokinesis of left ventricular apical septal wall, mild concentric LVH, Grade I DD, normal RV size/function, no significant valvular abnormalities.  LHC August 2025 for unstable angina demonstrated occluded SVG to RPDA, remaining grafts patent, proximal RCA 60%, mid RCA #1 90%, #2 60%, mid to distal RCA 50%, RPDA 50%.  Patient transferred to [facility] for staged PCI with atherectomy and DES x 3 to distal RCA, mid RCA and distal RCA to ostium of PDA.  Echo demonstrated normal LV/RV function, Grade I DD, abnormal septal motion consistent with postoperative status.  Patient denies chest pain, pressure or tightness. He is walking 2.25 miles 2-3 days a week  weather permitting.  - Continue aspirin , Plavix , amlodipine , carvedilol , atorvastatin , fenofibrate  as needed SL NTG.   Hypertension BP today 164/79 on intake and 142/64 on my recheck. Home BP typically >140 systolic but not checked regularly. Discussed goal BP of <130/80. Given multiple medications, patient and wife would like to be seen by pharm D HTN clinic.  - Refer to pharm D hypertension clinic. BP log provided.  - Continue valsartan , carvedilol , amlodipine , spironolactone .    Hyperlipidemia LDL 43, TG 130 August 2025, at goal. - Continue atorvastatin  and fenofibrate .  Disposition: Refer to pharm D HTN clinic. BP log provided. Return in 6 months or sooner as needed.          Signed, Barnie HERO. Boby Eyer, DNP, NP-C

## 2024-02-17 NOTE — Progress Notes (Signed)
 Patient ID: Luis Wise, male   DOB: [birth year], 83 y.o.   MRN: [id number]   HPI: Luis Wise is a 83 y.o.-year-old male, initially referred by his PCP, Dr. Cleatus, returning for follow-up: DM2, dx in 2004-2005, insulin -dependent, uncontrolled, with complications (CAD - s/p CABG x5, CKD).  Last visit 4  months ago.  He is here with his wife, who is a former Nurse, Learning Disability and offers part of the history especially related to his medication regimen, blood sugars, and activity.  Interim history: No increased urination, blurry vision, nausea, chest pain.  In 10/2023, he had unstable angina and was found to have an obstructed RCA (had previous bypass).  He had 3 stents placed.  He is feeling well now, without complaints.  Insulin  pump: - Omnipod 5 >> $$  - t:slim X2 - cheaper for him as it is considered durable medical equipment as opposed to the OmniPod pump which goes to his prescription benefits - started mid 08/2022 He had a pump failure 02/2023 but got a replacement immediately.  CGM: - Dexcom G6 >> G7  Insulin : - Humalog  U200    Reviewed HbA1c levels: Lab Results  Component Value Date   HGBA1C 5.6 11/06/2023   HGBA1C 5.8 (A) 10/14/2023   HGBA1C 5.5 06/03/2023   HGBA1C 5.8 (A) 01/28/2023   HGBA1C 6.2 (A) 09/25/2022   HGBA1C 6.1 (A) 07/02/2022   HGBA1C 5.9 (A) 03/11/2022   HGBA1C 6.4 01/08/2022   HGBA1C 6.2 (A) 11/06/2021   HGBA1C 6.5 (A) 08/07/2021   Patient was on a regimen of: - Metformin  500 mg in am and 1000 mg in pm >> 1500 mg at dinnertime.  Mom - Farxiga  5 >> 10 mg before breakfast-added 03/2020 - Glimepiride  4 mg in am - NovoLog  (16)-(16)-(14-16) before B-L-D - Tresiba  10 >> 26 >> 30 >> 32 >> 30 units daily-added 06/2020 >> now 24 units He was also tried on Rybelsus  but this was expensive.  He then tried Ozempic  but he developed nausea and vomiting and then increase lipase on 02/27/2020 (82  >> 119) on the 0.5 mg dose.  Ozempic  was stopped at that time. Prev. On  Januvia  >> tolerated well but expensive.  Currently on: - Farxiga  10 mg before b'fast - Metformin  1500 mg with dinner >> 1000 mg in am  - Insulin  pump: - Basal rates: 12 am: 1.8 6 am: 2.0 >> 1.8    5 pm: 1.7 - Insulin  to carb ratio: 12 am: 1:8 6 am: 1:7 >> 1:6.5 12 pm: 1:6 5 pm: 1:9 - Target: 12 am: 110 - Correction factor (insulin  sensitivity factor):  12 am: 70 >> 80   Actually still using 70 from 6 am  Total daily dose from basal insulin : 60% >> 65% (35 units) >> 68% (36 units) Total daily dose from bolus insulin : 37% >> 40% >> 35% >> 32% (16 units) TDD: 63-100 >> 54-100 >> 52-80 units  Pt checks his sugars >4x a day:  Prev.:  Previously:  Prev.:  Lowest sugar was 56 >> 55 >> 60s; he has hypoglycemia awareness in the 60s.  Highest sugar was 300 >> ... 274 >> 260s >> 300s.  Glucometer: OneTouch ultra  Pt's meals are: - Breakfast: 1 egg + toast or cereal or oatmeal >> 2 eggs, bacon, 1 slice of toast or English muffin - Lunch: grilled fish + veggies - Dinner: meat + veggies - Snacks: 2: nuts, wheat crackers He walks 3 miles 2-3 days a week for exercise.  He finished cardiac rehab.   -No CKD, last BUN/creatinine:  Lab Results  Component Value Date   BUN 29 (H) 11/25/2023   BUN 20 11/06/2023   CREATININE 1.12 11/25/2023   CREATININE 1.04 11/06/2023   Lab Results  Component Value Date   MICRALBCREAT 152 (H) 10/14/2023   MICRALBCREAT <30 06/24/2018  On valsartan  160 mg daily, previously on lisinopril .  -+ HL; last set of lipids: Lab Results  Component Value Date   CHOL 94 11/04/2023   HDL 25 (L) 11/04/2023   LDLCALC 43 11/04/2023   LDLDIRECT 35.0 01/08/2021   TRIG 130 11/04/2023   CHOLHDL 3.8 11/04/2023  On atorvastatin  80 mg daily, fenofibrate  160, co-Q10  On ASA 81.  - last eye exam was 04/24/2023: No DR.  - no numbness and tingling in his feet.  Last foot exam 10/14/2023.  Pt has FH of DM in father in his 33s.  He was taken off amiodarone .     ROS: + See HPI  I reviewed pt's medications, allergies, PMH, social hx, family hx, and changes were documented in the history of present illness. Otherwise, unchanged from my initial visit note.  Past Medical History:  Diagnosis Date   Allergy    Amoxicillin   Cataract 2017   Mild progression   Coronary artery disease    a. s/p 5-vessel CABG (LIMA-LAD, SVG-diag, sequential SVG-OM1-OM 2, & SVG-PDA)   Diabetes mellitus with complication (HCC)    Hyperlipidemia    Hypertension    Insomnia    neuropathy    bilateral feet   NSTEMI (non-ST elevated myocardial infarction) (HCC) 11/03/2023   Postoperative atrial fibrillation [facility])    Past Surgical History:  Procedure Laterality Date   CARDIAC CATHETERIZATION     01/19/2020   COLONOSCOPY WITH PROPOFOL  N/A 04/01/2021   Procedure: COLONOSCOPY WITH PROPOFOL ;  Surgeon: Jinny Carmine, MD;  Location: [facility] ENDOSCOPY;  Service: Endoscopy;  Laterality: N/A;   CORONARY ARTERY BYPASS GRAFT N/A 01/29/2020   Procedure: CORONARY ARTERY BYPASS GRAFTING (CABG) TIMES FIVE USING LIMA to LAD; ENDOSCOPIC HARVESTED GREATER SAPHENOUS VEIN: SVG to PDA; SVG to sequenced OM1 & OM2.;  Surgeon: Fleeta Hanford Coy, MD;  Location[facility] OR;  Service: Open Heart Surgery;  Laterality: N/A;   CORONARY ATHERECTOMY N/A 11/05/2023   Procedure: CORONARY ATHERECTOMY;  Surgeon: Wonda Sharper, MD;  Location: [facility] INVASIVE CV LAB;  Service: Cardiovascular;  Laterality: N/A;   CORONARY STENT INTERVENTION N/A 11/05/2023   Procedure: CORONARY STENT INTERVENTION;  Surgeon: Wonda Sharper, MD;  Location: [facility] INVASIVE CV LAB;  Service: Cardiovascular;  Laterality: N/A;   ENDOVEIN HARVEST OF GREATER SAPHENOUS VEIN Bilateral 01/29/2020   Procedure: ENDOVEIN HARVEST OF GREATER SAPHENOUS VEIN;  Surgeon: Fleeta Hanford Coy, MD;  Location: [facility] OR;  Service: Open Heart Surgery;  Laterality: Bilateral;   EYE SURGERY  Radial K   1997   LAMINECTOMY  1993   L5-S1   LEFT HEART CATH AND CORONARY  ANGIOGRAPHY N/A 01/19/2020   Procedure: LEFT HEART CATH AND CORONARY ANGIOGRAPHY;  Surgeon: Darron Deatrice LABOR, MD;  Location: [facility] INVASIVE CV LAB;  Service: Cardiovascular;  Laterality: N/A;   LEFT HEART CATH AND CORS/GRAFTS ANGIOGRAPHY Left 11/04/2023   Procedure: LEFT HEART CATH AND CORS/GRAFTS ANGIOGRAPHY;  Surgeon: Anner Alm ORN, MD;  Location: [facility] INVASIVE CV LAB;  Service: Cardiovascular;  Laterality: Left;   MENISCUS REPAIR  1996   TEE WITHOUT CARDIOVERSION N/A 01/29/2020   Procedure: TRANSESOPHAGEAL ECHOCARDIOGRAM (TEE);  Surgeon: Fleeta Hanford, Coy, MD;  Location: [facility]  OR;  Service: Open Heart Surgery;  Laterality: N/A;   TONSILLECTOMY     Social History   Socioeconomic History   Marital status: Married    Spouse name: Not on file   Number of children: Not on file   Years of education: Not on file   Highest education level: Master's degree (e.g., MA, MS, MEng, MEd, MSW, MBA)  Occupational History   Occupation: CFO  Tobacco Use   Smoking status: Never   Smokeless tobacco: Never  Vaping Use   Vaping status: Never Used  Substance and Sexual Activity   Alcohol use: Not Currently    Alcohol/week: 0.0 standard drinks of alcohol    Comment: Very rare- occasionaly beer or wine    Drug use: No   Sexual activity: Yes    Birth control/protection: None  Other Topics Concern   Not on file  Social History Narrative   Married 1970.   [employer]  undergrad, masters at [employer] .   Chief Of Staff, land and sea.     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   2 sons   Social Drivers of Health   Tobacco Use: Low Risk (02/08/2024)   Patient History    Smoking Tobacco Use: Never    Smokeless Tobacco Use: Never    Passive Exposure: Not on file  Financial Resource Strain: Low Risk (02/04/2024)   Overall Financial Resource Strain (CARDIA)    Difficulty of Paying Living Expenses: Not hard at all  Food Insecurity: No Food  Insecurity (02/04/2024)   Epic    Worried About Programme Researcher, Broadcasting/film/video in the Last Year: Never true    Ran Out of Food in the Last Year: Never true  Transportation Needs: No Transportation Needs (02/04/2024)   Epic    Lack of Transportation (Medical): No    Lack of Transportation (Non-Medical): No  Physical Activity: Sufficiently Active (02/04/2024)   Exercise Vital Sign    Days of Exercise per Week: 3 days    Minutes of Exercise per Session: 70 min  Stress: No Stress Concern Present (02/04/2024)   [employer] of Occupational Health - Occupational Stress Questionnaire    Feeling of Stress: Not at all  Social Connections: Socially Integrated (02/04/2024)   Social Connection and Isolation Panel    Frequency of Communication with Friends and Family: More than three times a week    Frequency of Social Gatherings with Friends and Family: Three times a week    Attends Religious Services: More than 4 times per year    Active Member of Clubs or Organizations: Yes    Attends Banker Meetings: More than 4 times per year    Marital Status: Married  Catering Manager Violence: Not At Risk (11/09/2023)   Epic    Fear of Current or Ex-Partner: No    Emotionally Abused: No    Physically Abused: No    Sexually Abused: No  Depression (PHQ2-9): Low Risk (02/08/2024)   Depression (PHQ2-9)    PHQ-2 Score: 0  Alcohol Screen: Low Risk (02/05/2023)   Alcohol Screen    Last Alcohol Screening Score (AUDIT): 1  Housing: Unknown (02/04/2024)   Epic    Unable to Pay for Housing in the Last Year: Patient declined    Number of Times Moved in the Last Year: 0    Homeless in the Last Year: No  Utilities: Not At Risk (11/09/2023)   Epic    Threatened with loss of utilities:  No  Health Literacy: Not on file   Current Outpatient Medications on File Prior to Visit  Medication Sig Dispense Refill   amLODipine  (NORVASC ) 10 MG tablet TAKE 1 TABLET BY MOUTH EVERY DAY 90 tablet 0   aspirin  EC 81 MG  tablet Take 81 mg by mouth daily. Swallow whole.     atorvastatin  (LIPITOR) 80 MG tablet Take 1 tablet (80 mg total) by mouth daily. 90 tablet 3   carvedilol  (COREG ) 12.5 MG tablet Take 1 tablet (12.5 mg total) by mouth 2 (two) times daily. 180 tablet 3   cholecalciferol  (VITAMIN D3) 25 MCG (1000 UNIT) tablet Take 1,000 Units by mouth every evening.     clopidogrel  (PLAVIX ) 75 MG tablet Take 1 tablet (75 mg total) by mouth daily with breakfast. 90 tablet 3   Coenzyme Q10 (COQ10) 100 MG CAPS Take 100 mg by mouth every evening.     Continuous Blood Gluc Transmit (DEXCOM G6 TRANSMITTER) MISC 1 Device by Does not apply route every 3 (three) months. E11.65 (Patient taking differently: 1 Device by Does not apply route every 3 (three) months. E11.65, G7) 1 each 3   cyanocobalamin  (VITAMIN B12) 1000 MCG tablet Take 1,000 mcg by mouth daily.     dapagliflozin  propanediol (FARXIGA ) 10 MG TABS tablet TAKE 1 TABLET BY MOUTH EVERY DAY BEFORE BREAKFAST 90 tablet 0   fenofibrate  160 MG tablet Take 1 tablet (160 mg total) by mouth daily. 90 tablet 3   glucose blood (ONETOUCH ULTRA) test strip Use to check blood sugar daily. Dx E11.9 200 strip 3   GVOKE HYPOPEN  2-PACK 1 MG/0.2ML SOAJ INJECT 0.2 MG UNDER SKIN AS NEEDED FOR LOW BLOOD SUGARS (Patient taking differently: as needed.) 0.4 mL PRN   Insulin  Infusion Pump (T: SLIM X2 INS PMP/CONTROL 7.4) DEVI Per endocrine.     insulin  lispro (HUMALOG  KWIKPEN) 200 UNIT/ML KwikPen USE AS INSTRUCTED IN THE INSULIN  PUMP MAX DAILY 145 UNITS 72 mL 3   Insulin  Pen Needle 32G X 4 MM MISC Use 4x a day 300 each 3   MegaRed Omega-3 Krill Oil 350 MG CAPS Take 350 mg by mouth daily.     metFORMIN  (GLUCOPHAGE ) 1000 MG tablet Take 1.5 tablets (1,500 mg total) by mouth every morning.     Misc Natural Products (URINOZINC PO) Take 1 tablet by mouth 2 (two) times daily.     Multiple Vitamins-Minerals (PRESERVISION AREDS 2 PO) Take 1 tablet by mouth 2 (two) times daily.      Multiple  Vitamins-Minerals (ZINC  PO) Take 1 tablet by mouth daily.     nitroGLYCERIN  (NITROSTAT ) 0.4 MG SL tablet Place 1 tablet (0.4 mg total) under the tongue every 5 (five) minutes as needed for chest pain. 25 tablet 1   sildenafil  (VIAGRA ) 100 MG tablet Take 1 tablet (100 mg total) by mouth as needed for erectile dysfunction. 30 tablet 6   spironolactone  (ALDACTONE ) 25 MG tablet Take 1 tablet (25 mg total) by mouth daily. 90 tablet 2   traZODone  (DESYREL ) 150 MG tablet Take 0.5-1 tablets (75-150 mg total) by mouth daily. 90 tablet 3   valsartan  (DIOVAN ) 160 MG tablet Take 1 tablet (160 mg total) by mouth daily. 90 tablet 3   No current facility-administered medications on file prior to visit.   Allergies  Allergen Reactions   Ozempic  (0.25 Or 0.5 Mg-Dose) [Semaglutide (0.25 Or 0.5mg -Dos)]     Presumed cause of GI upset.     Amoxil [Amoxicillin] Rash   Family History  Problem  Relation Age of Onset   Hypertension Mother    Diabetes Father    Cancer Neg Hx    COPD Neg Hx    Heart disease Neg Hx    Hyperlipidemia Neg Hx    Stroke Neg Hx    Colon cancer Neg Hx    Prostate cancer Neg Hx    PE: BP (!) 140/70   Pulse 60   Ht 5' 9.5 (1.765 m)   Wt 179 lb 12.8 oz (81.6 kg)   SpO2 95%   BMI 26.17 kg/m  Wt Readings from Last 3 Encounters:  02/17/24 179 lb 12.8 oz (81.6 kg)  02/08/24 181 lb 2 oz (82.2 kg)  11/25/23 175 lb 6.4 oz (79.6 kg)   Constitutional: normal weight, in NAD Eyes: EOMI, no exophthalmos ENT: no thyromegaly, no cervical lymphadenopathy Cardiovascular: RRR, No RG, + SEM 1/6 Respiratory: CTA B Musculoskeletal: no deformities Skin:  no rashes Neurological: no tremor with outstretched hands  ASSESSMENT: 1. DM, insulin -dependent, uncontrolled, with complications - CAD, s/p CABG x5 - CKD  He had difficulty obtaining the t:slim X2 insulin  pump due to lack of insulin  deficiency.  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  He has good insulin  production.  Of note, we cannot use a GLP-1 receptor agonist for him due to previously elevated lipase.  2. HL  PLAN:  1. Patient with longstanding, will controlled, insulin -dependent diabetes, on the t:slim X2 insulin  pump, changed to 08/2022 from OmniPod 5 pump due to price.  The tandem pump is integrated with the Dexcom G7 CGM.  He is also on an SGLT2 inhibitor due to a history of CAD and CKD.  In fact, since last visit, he had an NSTEMI and had 3 stents placed in RCA in 10/2023.  He previously had unstable angina, but feeling better now. - At last visit, the vast majority of his blood sugars appears to be fluctuating within the target range with only occasional higher blood sugars particularly after breakfast.  However, he also reported that after this meal, sugars were occasionally lower, down to the 60s or even 50s even without increasing activity, for example when he was in church.  We did strengthen insulin  to carb ratio with his meals including breakfast, as I suspected that the low blood sugars after this meal were happening due to the pump giving him extra insulin  boluses for correction of postprandial hyperglycemia.  To help with this, we relaxed his insulin  correction factor.  We also  reduced his basal rates from 6 AM to 5 PM.  I introduced changes into the pump for him. CGM interpretation: -At today's  visit, we reviewed his CGM downloads: It appears that 85% of values are in target range (goal >70%), while 14.3% are higher than 180 (goal <25%), and 0.3% are lower than 70 (goal <4%).  The calculated average blood sugar is 135.  The projected HbA1c for the next 3 months (GMI) is 6.5%. -Reviewing the CGM trends, sugars are fluctuating within the target range with only occasional hyperglycemic exceptions after breakfast but still with the vast majority of the blood sugars under 200s.  He does bolus correctly before breakfast but sugars did increase after this meal so I advised him to strengthen his insulin  to carb ratio for this.  Will do the same for dinner.  He is reticent to enter a lot of carbs into the pump for dinner to avoid dropping his blood sugars overnight.  We did discuss that entering enough carbs at the beginning of the meal and bolusing for them will stop the pump from correcting high blood sugars after the meal and he may improve his blood sugars overnight.   He may also need to enter some of the proteins into the pump as carbs (30-40% in grams). - I suggested to:  Patient Instructions  Please continue: - Farxiga  10 mg before b'fast - Metformin  1000 mg in am  Use the following pump settings: - Basal rates: 12 am: 1.8 6 am: 1.8                                     5 pm: 1.7 - Insulin  to carb ratio: 12 am: 1:8 6 am: 1:6.5 >> 1:6 12 pm: 1:6 5 pm: 1:9 >> 1:8.5 - Target: 12 am: 110 - Correction factor (insulin  sensitivity factor):  12 am: 80 6 am: 70   Please return in 4-6 months.  - we checked his HbA1c: 5.6% (stable, excellent) - advised to check sugars at different times of the day - 4x a day, rotating check times - advised for yearly eye exams >> he is UTD -At last visit, he had an elevated ACR.  Will repeat this today. - return to clinic in 4 to 6 months months (to obtain his supplies, he apparently needs to come every 4 months)  2. HL - Latest lipid panel showed  fractions at goal except for a low HDL: Lab Results  Component Value Date   CHOL 94 11/04/2023   HDL 25 (L) 11/04/2023   LDLCALC 43 11/04/2023   LDLDIRECT 35.0 01/08/2021   TRIG 130 11/04/2023   CHOLHDL 3.8 11/04/2023  - He continues on Lipitor 80 mg daily and fenofibrate  160 mg daily without side effects  Lela Fendt, MD PhD [facility] Endocrinology

## 2024-02-17 NOTE — Addendum Note (Signed)
 Addended by: CLEOTILDE ROLIN RAMAN on: 02/17/2024 03:27 PM   Modules accepted: Orders

## 2024-02-18 ENCOUNTER — Ambulatory Visit: Payer: Self-pay | Admitting: Internal Medicine

## 2024-02-18 LAB — MICROALBUMIN / CREATININE URINE RATIO
Creatinine, Urine: 70 mg/dL (ref 20–320)
Microalb Creat Ratio: 99 mg/g{creat} — ABNORMAL HIGH (ref <30)
Microalb, Ur: 6.9 mg/dL

## 2024-02-20 ENCOUNTER — Other Ambulatory Visit: Payer: Self-pay | Admitting: Internal Medicine

## 2024-02-20 DIAGNOSIS — E1159 Type 2 diabetes mellitus with other circulatory complications: Secondary | ICD-10-CM

## 2024-02-21 ENCOUNTER — Encounter

## 2024-02-21 ENCOUNTER — Encounter: Payer: Self-pay | Admitting: Student

## 2024-02-21 ENCOUNTER — Ambulatory Visit: Attending: Student | Admitting: Student

## 2024-02-21 VITALS — BP 142/64 | HR 64 | Ht 69.5 in | Wt 182.0 lb

## 2024-02-21 DIAGNOSIS — I10 Essential (primary) hypertension: Secondary | ICD-10-CM

## 2024-02-21 DIAGNOSIS — I25810 Atherosclerosis of coronary artery bypass graft(s) without angina pectoris: Secondary | ICD-10-CM | POA: Insufficient documentation

## 2024-02-21 DIAGNOSIS — E785 Hyperlipidemia, unspecified: Secondary | ICD-10-CM | POA: Diagnosis present

## 2024-02-21 NOTE — Patient Instructions (Addendum)
 Medication Instructions:   Your physician recommends that you continue on your current medications as directed. Please refer to the Current Medication list given to you today.    *If you need a refill on your cardiac medications before your next appointment, please call your pharmacy*  Lab Work:  None ordered at this time   If you have labs (blood work) drawn today and your tests are completely normal, you will receive your results only by:  MyChart Message (if you have MyChart) OR  A paper copy in the mail If you have any lab test that is abnormal or we need to change your treatment, we will call you to review the results.  Testing/Procedures:  None ordered at this time   Referrals:  Your cardiologist has referred you to [facility]  We have attached their office location and phone number below.  Please allow them 3-5 business days to reach out to you to make an appointment.  If you have not heard from their office within that time, please call them to schedule your appointment.    Follow-Up:  At [facility], you and your health needs are our priority.  As part of our continuing mission to provide you with exceptional heart care, our providers are all part of one team.  This team includes your primary Cardiologist (physician) and Advanced Practice Providers or APPs (Physician Assistants and Nurse Practitioners) who all work together to provide you with the care you need, when you need it.  Your next appointment:   5 - 6 month(s)  Provider:    Evalene Lunger, MD or Barnie Hila, NP    We recommend signing up for the patient portal called MyChart.  Sign up information is provided on this After Visit Summary.  MyChart is used to connect with patients for Virtual Visits (Telemedicine).  Patients are able to view lab/test results, encounter notes, upcoming appointments, etc.  Non-urgent messages can be sent to your provider as well.   To learn more  about what you can do with MyChart, go to [email or web address].   Avoid alcohol, caffeine, and exercise within 30 minutes before checking blood pressure. Sit still, with feet flat on the floor, in a straight backed, supportive chair.  Rest for 5 minutes.  Support arm on flat surface at heart level. Bottom of cuff should be placed directly above the bend of the elbow. Take multiple readings.  Write down and bring to all follow up appointments.  Bring cuff to clinic periodically for accuracy comparison.

## 2024-02-23 ENCOUNTER — Encounter

## 2024-03-05 ENCOUNTER — Other Ambulatory Visit: Payer: Self-pay | Admitting: Family Medicine

## 2024-03-07 ENCOUNTER — Ambulatory Visit: Admitting: Family

## 2024-03-07 ENCOUNTER — Encounter: Payer: Self-pay | Admitting: Family

## 2024-03-07 VITALS — BP 146/72 | HR 74 | Temp 98.4°F | Ht 69.5 in | Wt 180.0 lb

## 2024-03-07 DIAGNOSIS — J019 Acute sinusitis, unspecified: Secondary | ICD-10-CM

## 2024-03-07 DIAGNOSIS — B9689 Other specified bacterial agents as the cause of diseases classified elsewhere: Secondary | ICD-10-CM

## 2024-03-07 LAB — POCT INFLUENZA A/B
Influenza A, POC: NEGATIVE
Influenza B, POC: NEGATIVE

## 2024-03-07 MED ORDER — DOXYCYCLINE HYCLATE 100 MG PO TABS: 100.0000 mg | ORAL_TABLET | Freq: Two times a day (BID) | ORAL | 0 refills | Status: AC

## 2024-03-07 NOTE — Progress Notes (Signed)
 "  Acute Office Visit  Subjective:     Patient ID: Luis Wise, male    DOB: September 12, 1940, 83 y.o.   MRN: [id number]  Chief Complaint  Patient presents with   Cough   Fatigue    HPI Patient is in today with complaints of cough, congestion, yellow phlegm x 8 days and worsening.  Has been taken over-the-counter DayQuil and NyQuil without much relief.  Has also taken Delsym that has helped.  Patient is status post stent replacement in October.  Wife is a engineer, civil (consulting) and she is concerned.  Review of Systems  Constitutional:  Negative for chills and fever.  HENT:  Positive for congestion.   Respiratory:  Positive for cough and sputum production.   Cardiovascular: Negative.   Gastrointestinal: Negative.   Musculoskeletal: Negative.   Neurological: Negative.   Endo/Heme/Allergies: Negative.   Psychiatric/Behavioral: Negative.    All other systems reviewed and are negative.   Past Medical History:  Diagnosis Date   Allergy    Amoxicillin   Cataract 2017   Mild progression   Coronary artery disease    a. s/p 5-vessel CABG (LIMA-LAD, SVG-diag, sequential SVG-OM1-OM 2, & SVG-PDA)   Diabetes mellitus with complication (HCC)    Hyperlipidemia    Hypertension    Insomnia    neuropathy    bilateral feet   NSTEMI (non-ST elevated myocardial infarction) (HCC) 11/03/2023   Postoperative atrial fibrillation (HCC)     Social History   Socioeconomic History   Marital status: Married    Spouse name: Not on file   Number of children: Not on file   Years of education: Not on file   Highest education level: Master's degree (e.g., MA, MS, MEng, MEd, MSW, MBA)  Occupational History   Occupation: CFO  Tobacco Use   Smoking status: Never   Smokeless tobacco: Never  Vaping Use   Vaping status: Never Used  Substance and Sexual Activity   Alcohol use: Not Currently    Alcohol/week: 0.0 standard drinks of alcohol    Comment: Very rare- occasionaly beer or wine    Drug use: No   Sexual  activity: Yes    Birth control/protection: None  Other Topics Concern   Not on file  Social History Narrative   Married 1970.   [employer]  undergrad, masters at [employer] .   Chief Of Staff, land and sea.     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   2 sons   Social Drivers of Health   Tobacco Use: Low Risk (03/07/2024)   Patient History    Smoking Tobacco Use: Never    Smokeless Tobacco Use: Never    Passive Exposure: Not on file  Financial Resource Strain: Low Risk (02/04/2024)   Overall Financial Resource Strain (CARDIA)    Difficulty of Paying Living Expenses: Not hard at all  Food Insecurity: No Food Insecurity (02/04/2024)   Epic    Worried About Programme Researcher, Broadcasting/film/video in the Last Year: Never true    Ran Out of Food in the Last Year: Never true  Transportation Needs: No Transportation Needs (02/04/2024)   Epic    Lack of Transportation (Medical): No    Lack of Transportation (Non-Medical): No  Physical Activity: Sufficiently Active (02/04/2024)   Exercise Vital Sign    Days of Exercise per Week: 3 days    Minutes of Exercise per Session: 70 min  Stress: No Stress Concern Present (02/04/2024)   [employer] of  Occupational Health - Occupational Stress Questionnaire    Feeling of Stress: Not at all  Social Connections: Socially Integrated (02/04/2024)   Social Connection and Isolation Panel    Frequency of Communication with Friends and Family: More than three times a week    Frequency of Social Gatherings with Friends and Family: Three times a week    Attends Religious Services: More than 4 times per year    Active Member of Clubs or Organizations: Yes    Attends Banker Meetings: More than 4 times per year    Marital Status: Married  Catering Manager Violence: Not At Risk (11/09/2023)   Epic    Fear of Current or Ex-Partner: No    Emotionally Abused: No    Physically Abused: No     Sexually Abused: No  Depression (PHQ2-9): Low Risk (02/08/2024)   Depression (PHQ2-9)    PHQ-2 Score: 0  Alcohol Screen: Low Risk (02/05/2023)   Alcohol Screen    Last Alcohol Screening Score (AUDIT): 1  Housing: Unknown (02/04/2024)   Epic    Unable to Pay for Housing in the Last Year: Patient declined    Number of Times Moved in the Last Year: 0    Homeless in the Last Year: No  Utilities: Not At Risk (11/09/2023)   Epic    Threatened with loss of utilities: No  Health Literacy: Not on file    Past Surgical History:  Procedure Laterality Date   CARDIAC CATHETERIZATION     01/19/2020   COLONOSCOPY WITH PROPOFOL  N/A 04/01/2021   Procedure: COLONOSCOPY WITH PROPOFOL ;  Surgeon: Jinny Carmine, MD;  Location: [facility] ENDOSCOPY;  Service: Endoscopy;  Laterality: N/A;   CORONARY ARTERY BYPASS GRAFT N/A 01/29/2020   Procedure: CORONARY ARTERY BYPASS GRAFTING (CABG) TIMES FIVE USING LIMA to LAD; ENDOSCOPIC HARVESTED GREATER SAPHENOUS VEIN: SVG to PDA; SVG to sequenced OM1 & OM2.;  Surgeon: Fleeta Hanford Coy, MD;  Location: [facility] OR;  Service: Open Heart Surgery;  Laterality: N/A;   CORONARY ATHERECTOMY N/A 11/05/2023   Procedure: CORONARY ATHERECTOMY;  Surgeon: Wonda Sharper, MD;  Location[facility] INVASIVE CV LAB;  Service: Cardiovascular;  Laterality: N/A;   CORONARY STENT INTERVENTION N/A 11/05/2023   Procedure: CORONARY STENT INTERVENTION;  Surgeon: Wonda Sharper, MD;  Location: [facility] INVASIVE CV LAB;  Service: Cardiovascular;  Laterality: N/A;   ENDOVEIN HARVEST OF GREATER SAPHENOUS VEIN Bilateral 01/29/2020   Procedure: ENDOVEIN HARVEST OF GREATER SAPHENOUS VEIN;  Surgeon: Fleeta Hanford Coy, MD;  Location: [facility] OR;  Service: Open Heart Surgery;  Laterality: Bilateral;   EYE SURGERY  Radial K   1997   LAMINECTOMY  1993   L5-S1   LEFT HEART CATH AND CORONARY ANGIOGRAPHY N/A 01/19/2020   Procedure: LEFT HEART CATH AND CORONARY ANGIOGRAPHY;  Surgeon: Darron Deatrice LABOR, MD;  Location: [facility] INVASIVE CV LAB;   Service: Cardiovascular;  Laterality: N/A;   LEFT HEART CATH AND CORS/GRAFTS ANGIOGRAPHY Left 11/04/2023   Procedure: LEFT HEART CATH AND CORS/GRAFTS ANGIOGRAPHY;  Surgeon: Anner Alm ORN, MD;  Location: [facility] INVASIVE CV LAB;  Service: Cardiovascular;  Laterality: Left;   MENISCUS REPAIR  1996   TEE WITHOUT CARDIOVERSION N/A 01/29/2020   Procedure: TRANSESOPHAGEAL ECHOCARDIOGRAM (TEE);  Surgeon: Fleeta Hanford, Coy, MD;  Location: [facility] OR;  Service: Open Heart Surgery;  Laterality: N/A;   TONSILLECTOMY      Family History  Problem Relation Age of Onset   Hypertension Mother    Diabetes Father    Cancer Neg Hx  COPD Neg Hx    Heart disease Neg Hx    Hyperlipidemia Neg Hx    Stroke Neg Hx    Colon cancer Neg Hx    Prostate cancer Neg Hx     Allergies[1]  Medications Ordered Prior to Encounter[2]  BP (!) 146/72 (BP Location: Right Arm, Patient Position: Sitting, Cuff Size: Normal)   Pulse 74   Temp 98.4 F (36.9 C) (Oral)   Ht 5' 9.5 (1.765 m)   Wt 180 lb (81.6 kg)   SpO2 95%   BMI 26.20 kg/m chart     Objective:    BP (!) 146/72 (BP Location: Right Arm, Patient Position: Sitting, Cuff Size: Normal)   Pulse 74   Temp 98.4 F (36.9 C) (Oral)   Ht 5' 9.5 (1.765 m)   Wt 180 lb (81.6 kg)   SpO2 95%   BMI 26.20 kg/m    Physical Exam Vitals and nursing note reviewed.  Constitutional:      Appearance: Normal appearance. He is normal weight.  HENT:     Right Ear: Tympanic membrane, ear canal and external ear normal.     Left Ear: Tympanic membrane, ear canal and external ear normal.     Nose: Congestion present.     Mouth/Throat:     Mouth: Mucous membranes are moist.     Pharynx: No oropharyngeal exudate.  Cardiovascular:     Rate and Rhythm: Normal rate and regular rhythm.     Pulses: Normal pulses.     Heart sounds: Normal heart sounds.  Pulmonary:     Effort: Pulmonary effort is normal.     Breath sounds: Normal breath sounds.  Musculoskeletal:         General: Normal range of motion.     Cervical back: Normal range of motion and neck supple.  Skin:    General: Skin is warm and dry.  Neurological:     General: No focal deficit present.     Mental Status: He is alert and oriented to person, place, and time. Mental status is at baseline.  Psychiatric:        Mood and Affect: Mood normal.        Behavior: Behavior normal.        Thought Content: Thought content normal.        Judgment: Judgment normal.     No results found for any visits on 03/07/24.      Assessment & Plan:   Problem List Items Addressed This Visit   None Visit Diagnoses       Acute bacterial sinusitis    -  Primary   Relevant Medications   doxycycline  (VIBRA -TABS) 100 MG tablet   Other Relevant Orders   POCT Influenza A/B       Meds ordered this encounter  Medications   doxycycline  (VIBRA -TABS) 100 MG tablet    Sig: Take 1 tablet (100 mg total) by mouth 2 (two) times daily.    Dispense:  20 tablet    Refill:  0   Call the office if symptoms worsen or persist.  Recheck as scheduled and sooner as needed.  No follow-ups on file.  Yanky Vanderburg B Fathima Bartl, FNP      [1]  Allergies Allergen Reactions   Ozempic  (0.25 Or 0.5 Mg-Dose) [Semaglutide (0.25 Or 0.5mg -Dos)]     Presumed cause of GI upset.     Amoxil [Amoxicillin] Rash  [2]  Current Outpatient Medications on File Prior to Visit  Medication Sig Dispense Refill  amLODipine  (NORVASC ) 10 MG tablet TAKE 1 TABLET BY MOUTH EVERY DAY 90 tablet 0   aspirin  EC 81 MG tablet Take 81 mg by mouth daily. Swallow whole.     atorvastatin  (LIPITOR) 80 MG tablet Take 1 tablet (80 mg total) by mouth daily. 90 tablet 3   carvedilol  (COREG ) 12.5 MG tablet Take 1 tablet (12.5 mg total) by mouth 2 (two) times daily. 180 tablet 3   cholecalciferol  (VITAMIN D3) 25 MCG (1000 UNIT) tablet Take 1,000 Units by mouth every evening.     clopidogrel  (PLAVIX ) 75 MG tablet Take 1 tablet (75 mg total) by mouth daily with breakfast.  90 tablet 3   Coenzyme Q10 (COQ10) 100 MG CAPS Take 100 mg by mouth every evening.     Continuous Glucose Sensor (DEXCOM G7 SENSOR) MISC by Does not apply route.     cyanocobalamin  (VITAMIN B12) 1000 MCG tablet Take 1,000 mcg by mouth daily.     dapagliflozin  propanediol (FARXIGA ) 10 MG TABS tablet TAKE 1 TABLET BY MOUTH EVERY DAY BEFORE BREAKFAST 90 tablet 0   fenofibrate  160 MG tablet TAKE 1 TABLET BY MOUTH EVERY DAY 90 tablet 0   glucose blood (ONETOUCH ULTRA) test strip Use to check blood sugar daily. Dx E11.9 200 strip 3   GVOKE HYPOPEN  2-PACK 1 MG/0.2ML SOAJ INJECT 0.2 MG UNDER SKIN AS NEEDED FOR LOW BLOOD SUGARS (Patient taking differently: as needed.) 0.4 mL PRN   Insulin  Infusion Pump (T: SLIM X2 INS PMP/CONTROL 7.4) DEVI Per endocrine.     insulin  lispro (HUMALOG  KWIKPEN) 200 UNIT/ML KwikPen USE AS INSTRUCTED IN THE INSULIN  PUMP MAX DAILY 145 UNITS 9 mL 3   Insulin  Pen Needle 32G X 4 MM MISC Use 4x a day 300 each 3   MegaRed Omega-3 Krill Oil 350 MG CAPS Take 350 mg by mouth daily.     metFORMIN  (GLUCOPHAGE ) 1000 MG tablet Take 1.5 tablets (1,500 mg total) by mouth every morning.     Misc Natural Products (URINOZINC PO) Take 1 tablet by mouth 2 (two) times daily.     Multiple Vitamins-Minerals (PRESERVISION AREDS 2 PO) Take 1 tablet by mouth 2 (two) times daily.      Multiple Vitamins-Minerals (ZINC  PO) Take 1 tablet by mouth daily.     sildenafil  (VIAGRA ) 100 MG tablet Take 1 tablet (100 mg total) by mouth as needed for erectile dysfunction. 30 tablet 6   spironolactone  (ALDACTONE ) 25 MG tablet Take 1 tablet (25 mg total) by mouth daily. 90 tablet 2   traZODone  (DESYREL ) 150 MG tablet Take 0.5-1 tablets (75-150 mg total) by mouth daily. 90 tablet 3   valsartan  (DIOVAN ) 160 MG tablet Take 1 tablet (160 mg total) by mouth daily. 90 tablet 3   No current facility-administered medications on file prior to visit.   "

## 2024-03-12 ENCOUNTER — Other Ambulatory Visit: Payer: Self-pay | Admitting: Internal Medicine

## 2024-03-20 ENCOUNTER — Ambulatory Visit

## 2024-03-20 VITALS — BP 130/80 | Ht 69.5 in | Wt 165.0 lb

## 2024-03-20 DIAGNOSIS — Z0000 Encounter for general adult medical examination without abnormal findings: Secondary | ICD-10-CM | POA: Diagnosis not present

## 2024-03-20 NOTE — Patient Instructions (Signed)
 Mr. Habibi,  Thank you for taking the time for your Medicare Wellness Visit. I appreciate your continued commitment to your health goals. Please review the care plan we discussed, and feel free to reach out if I can assist you further.  Please note that Annual Wellness Visits do not include a physical exam. Some assessments may be limited, especially if the visit was conducted virtually. If needed, we may recommend an in-person follow-up with your provider.  Ongoing Care Seeing your primary care provider every 3 to 6 months helps us  monitor your health and provide consistent, personalized care.   Referrals If a referral was made during today's visit and you haven't received any updates within two weeks, please contact the referred provider directly to check on the status.  Recommended Screenings:  Health Maintenance  Topic Date Due   Medicare Annual Wellness Visit  12/23/2022   Eye exam for diabetics  04/23/2024   Hemoglobin A1C  08/17/2024   Complete foot exam   10/13/2024   Yearly kidney function blood test for diabetes  11/24/2024   Yearly kidney health urinalysis for diabetes  02/16/2025   DTaP/Tdap/Td vaccine (2 - Td or Tdap) 05/25/2031   Pneumococcal Vaccine for age over 22  Completed   Flu Shot  Completed   Zoster (Shingles) Vaccine  Completed   Meningitis B Vaccine  Aged Out   Colon Cancer Screening  Discontinued   COVID-19 Vaccine  Discontinued   Hepatitis C Screening  Discontinued       11/05/2023   11:24 AM  Advanced Directives  Would patient like information on creating a medical advance directive? Yes (Inpatient - patient requests chaplain consult to create a medical advance directive)    Vision: Annual vision screenings are recommended for early detection of glaucoma, cataracts, and diabetic retinopathy. These exams can also reveal signs of chronic conditions such as diabetes and high blood pressure.  Dental: Annual dental screenings help detect early signs of  oral cancer, gum disease, and other conditions linked to overall health, including heart disease and diabetes.  Please see the attached documents for additional preventive care recommendations.

## 2024-03-20 NOTE — Progress Notes (Signed)
 " I connected with  Luis Wise on 03/20/2024 by a audio enabled telemedicine application and verified that I am speaking with the correct person using two identifiers.  Patient Location: Home  Provider Location: Home Office  Persons Participating in Visit: Patient.  I discussed the limitations of evaluation and management by telemedicine. The patient expressed understanding and agreed to proceed.  Vital Signs: Because this visit was a virtual/telehealth visit, some criteria may be missing or patient reported. Any vitals not documented were not able to be obtained and vitals that have been documented are patient reported.  Chief Complaint  Patient presents with   Medicare Wellness     Subjective:   Luis Wise is a 84 y.o. male who presents for a Medicare Annual Wellness Visit.  Visit info / Clinical Intake: Medicare Wellness Visit Type:: Subsequent Annual Wellness Visit Persons participating in visit and providing information:: patient Medicare Wellness Visit Mode:: Telephone If telephone:: video declined Since this visit was completed virtually, some vitals may be partially provided or unavailable. Missing vitals are due to the limitations of the virtual format.: Documented vitals are patient reported If Telephone or Video please confirm:: I connected with patient using audio/video enable telemedicine. I verified patient identity with two identifiers, discussed telehealth limitations, and patient agreed to proceed. Patient Location:: home Provider Location:: home office Interpreter Needed?: No Pre-visit prep was completed: yes AWV questionnaire completed by patient prior to visit?: no Living arrangements:: lives with spouse/significant other Patient's Overall Health Status Rating: very good Typical amount of pain: none Does pain affect daily life?: no Are you currently prescribed opioids?: no  Dietary Habits and Nutritional Risks How many meals a day?: 3 Eats fruit and  vegetables daily?: yes Most meals are obtained by: preparing own meals; having others provide food In the last 2 weeks, have you had any of the following?: none Diabetic:: (!) yes Any non-healing wounds?: no How often do you check your BS?: continuous glucose monitor Would you like to be referred to a Nutritionist or for Diabetic Management? : no  Functional Status Activities of Daily Living (to include ambulation/medication): Independent Ambulation: Independent Medication Administration: Independent Home Management (perform basic housework or laundry): Independent Manage your own finances?: yes Primary transportation is: driving Concerns about vision?: no *vision screening is required for WTM* Concerns about hearing?: no  Fall Screening Falls in the past year?: 0 Number of falls in past year: 0 Was there an injury with Fall?: 0 Fall Risk Category Calculator: 0 Patient Fall Risk Level: Low Fall Risk  Fall Risk Patient at Risk for Falls Due to: No Fall Risks Fall risk Follow up: Falls evaluation completed; Falls prevention discussed  Home and Transportation Safety: All rugs have non-skid backing?: yes All stairs or steps have railings?: yes Grab bars in the bathtub or shower?: yes Have non-skid surface in bathtub or shower?: yes Good home lighting?: yes Regular seat belt use?: yes Hospital stays in the last year:: no  Cognitive Assessment Difficulty concentrating, remembering, or making decisions? : no Will 6CIT or Mini Cog be Completed: yes What year is it?: 0 points What month is it?: 0 points Give patient an address phrase to remember (5 components): remember words apple , table, penny About what time is it?: 0 points Count backwards from 20 to 1: 0 points Say the months of the year in reverse: 0 points Repeat the address phrase from earlier: 0 points 6 CIT Score: 0 points  Advance Directives (For Healthcare) Does Patient  Have a Medical Advance Directive?:  Yes Does patient want to make changes to medical advance directive?: No - Patient declined Type of Advance Directive: Healthcare Power of [place]; Living will Copy of Healthcare Power of Attorney in Chart?: Yes - validated most recent copy scanned in chart (See row information) Copy of Living Will in Chart?: Yes - validated most recent copy scanned in chart (See row information) Would patient like information on creating a medical advance directive?: Yes (Inpatient - patient requests chaplain consult to create a medical advance directive)  Reviewed/Updated  Reviewed/Updated: Reviewed All (Medical, Surgical, Family, Medications, Allergies, Care Teams, Patient Goals)    Allergies (verified) Ozempic  (0.25 or 0.5 mg-dose) [semaglutide (0.25 or 0.5mg -dos)] and Amoxil [amoxicillin]   Current Medications (verified) Outpatient Encounter Medications as of 03/20/2024  Medication Sig   amLODipine  (NORVASC ) 10 MG tablet TAKE 1 TABLET BY MOUTH EVERY DAY   aspirin  EC 81 MG tablet Take 81 mg by mouth daily. Swallow whole.   atorvastatin  (LIPITOR) 80 MG tablet Take 1 tablet (80 mg total) by mouth daily.   carvedilol  (COREG ) 12.5 MG tablet Take 1 tablet (12.5 mg total) by mouth 2 (two) times daily.   cholecalciferol  (VITAMIN D3) 25 MCG (1000 UNIT) tablet Take 1,000 Units by mouth every evening.   clopidogrel  (PLAVIX ) 75 MG tablet Take 1 tablet (75 mg total) by mouth daily with breakfast.   Coenzyme Q10 (COQ10) 100 MG CAPS Take 100 mg by mouth every evening.   Continuous Glucose Sensor (DEXCOM G7 SENSOR) MISC by Does not apply route.   cyanocobalamin  (VITAMIN B12) 1000 MCG tablet Take 1,000 mcg by mouth daily.   dapagliflozin  propanediol (FARXIGA ) 10 MG TABS tablet TAKE 1 TABLET BY MOUTH EVERY DAY BEFORE BREAKFAST   doxycycline  (VIBRA -TABS) 100 MG tablet Take 1 tablet (100 mg total) by mouth 2 (two) times daily.   fenofibrate  160 MG tablet TAKE 1 TABLET BY MOUTH EVERY DAY   glucose blood (ONETOUCH ULTRA)  test strip Use to check blood sugar daily. Dx E11.9   GVOKE HYPOPEN  2-PACK 1 MG/0.2ML SOAJ INJECT 0.2 MG UNDER SKIN AS NEEDED FOR LOW BLOOD SUGARS (Patient taking differently: as needed.)   Insulin  Infusion Pump (T: SLIM X2 INS PMP/CONTROL 7.4) DEVI Per endocrine.   insulin  lispro (HUMALOG  KWIKPEN) 200 UNIT/ML KwikPen USE AS INSTRUCTED IN THE INSULIN  PUMP MAX DAILY 145 UNITS   Insulin  Pen Needle 32G X 4 MM MISC Use 4x a day   MegaRed Omega-3 Krill Oil 350 MG CAPS Take 350 mg by mouth daily.   metFORMIN  (GLUCOPHAGE ) 1000 MG tablet Take 1.5 tablets (1,500 mg total) by mouth every morning.   Misc Natural Products (URINOZINC PO) Take 1 tablet by mouth 2 (two) times daily.   Multiple Vitamins-Minerals (PRESERVISION AREDS 2 PO) Take 1 tablet by mouth 2 (two) times daily.    Multiple Vitamins-Minerals (ZINC  PO) Take 1 tablet by mouth daily.   sildenafil  (VIAGRA ) 100 MG tablet Take 1 tablet (100 mg total) by mouth as needed for erectile dysfunction.   spironolactone  (ALDACTONE ) 25 MG tablet Take 1 tablet (25 mg total) by mouth daily.   traZODone  (DESYREL ) 150 MG tablet Take 0.5-1 tablets (75-150 mg total) by mouth daily.   valsartan  (DIOVAN ) 160 MG tablet Take 1 tablet (160 mg total) by mouth daily.   No facility-administered encounter medications on file as of 03/20/2024.    History: Past Medical History:  Diagnosis Date   Allergy    Amoxicillin   Cataract 2017   Mild progression  Coronary artery disease    a. s/p 5-vessel CABG (LIMA-LAD, SVG-diag, sequential SVG-OM1-OM 2, & SVG-PDA)   Diabetes mellitus with complication (HCC)    Hyperlipidemia    Hypertension    Insomnia    neuropathy    bilateral feet   NSTEMI (non-ST elevated myocardial infarction) (HCC) 11/03/2023   Postoperative atrial fibrillation [facility])    Past Surgical History:  Procedure Laterality Date   CARDIAC CATHETERIZATION     01/19/2020   COLONOSCOPY WITH PROPOFOL  N/A 04/01/2021   Procedure: COLONOSCOPY WITH PROPOFOL ;   Surgeon: Jinny Carmine, MD;  Location: [facility] ENDOSCOPY;  Service: Endoscopy;  Laterality: N/A;   CORONARY ARTERY BYPASS GRAFT N/A 01/29/2020   Procedure: CORONARY ARTERY BYPASS GRAFTING (CABG) TIMES FIVE USING LIMA to LAD; ENDOSCOPIC HARVESTED GREATER SAPHENOUS VEIN: SVG to PDA; SVG to sequenced OM1 & OM2.;  Surgeon: Fleeta Hanford Coy, MD;  Location: [facility] OR;  Service: Open Heart Surgery;  Laterality: N/A;   CORONARY ATHERECTOMY N/A 11/05/2023   Procedure: CORONARY ATHERECTOMY;  Surgeon: Wonda Sharper, MD;  Location: [facility] INVASIVE CV LAB;  Service: Cardiovascular;  Laterality: N/A;   CORONARY STENT INTERVENTION N/A 11/05/2023   Procedure: CORONARY STENT INTERVENTION;  Surgeon: Wonda Sharper, MD;  Location: [facility] INVASIVE CV LAB;  Service: Cardiovascular;  Laterality: N/A;   ENDOVEIN HARVEST OF GREATER SAPHENOUS VEIN Bilateral 01/29/2020   Procedure: ENDOVEIN HARVEST OF GREATER SAPHENOUS VEIN;  Surgeon: Fleeta Hanford Coy, MD;  Location: [facility] OR;  Service: Open Heart Surgery;  Laterality: Bilateral;   EYE SURGERY  Radial K   1997   LAMINECTOMY  1993   L5-S1   LEFT HEART CATH AND CORONARY ANGIOGRAPHY N/A 01/19/2020   Procedure: LEFT HEART CATH AND CORONARY ANGIOGRAPHY;  Surgeon: Darron Deatrice LABOR, MD;  Location: [facility] INVASIVE CV LAB;  Service: Cardiovascular;  Laterality: N/A;   LEFT HEART CATH AND CORS/GRAFTS ANGIOGRAPHY Left 11/04/2023   Procedure: LEFT HEART CATH AND CORS/GRAFTS ANGIOGRAPHY;  Surgeon: Anner Alm ORN, MD;  Location: [facility] INVASIVE CV LAB;  Service: Cardiovascular;  Laterality: Left;   MENISCUS REPAIR  1996   TEE WITHOUT CARDIOVERSION N/A 01/29/2020   Procedure: TRANSESOPHAGEAL ECHOCARDIOGRAM (TEE);  Surgeon: Fleeta Hanford, Coy, MD;  Location[facility] OR;  Service: Open Heart Surgery;  Laterality: N/A;   TONSILLECTOMY     Family History  Problem Relation Age of Onset   Hypertension Mother    Diabetes Father    Cancer Neg Hx    COPD Neg Hx    Heart disease Neg Hx    Hyperlipidemia Neg Hx     Stroke Neg Hx    Colon cancer Neg Hx    Prostate cancer Neg Hx    Social History   Occupational History   Occupation: CFO  Tobacco Use   Smoking status: Never   Smokeless tobacco: Never  Vaping Use   Vaping status: Never Used  Substance and Sexual Activity   Alcohol use: Not Currently    Comment: Very rare- occasionaly beer or wine    Drug use: No   Sexual activity: Yes    Birth control/protection: None   Tobacco Counseling Counseling given: Not Answered  SDOH Screenings   Food Insecurity: No Food Insecurity (03/20/2024)  Housing: Low Risk (03/20/2024)  Transportation Needs: No Transportation Needs (03/20/2024)  Utilities: Not At Risk (03/20/2024)  Alcohol Screen: Low Risk (02/05/2023)  Depression (PHQ2-9): Low Risk (03/20/2024)  Financial Resource Strain: Low Risk (02/04/2024)  Physical Activity: Sufficiently Active (02/04/2024)  Social Connections: Socially Integrated (03/20/2024)  Stress: No  Stress Concern Present (02/04/2024)  Tobacco Use: Low Risk (03/20/2024)  Health Literacy: Adequate Health Literacy (03/20/2024)   See flowsheets for full screening details  Depression Screen PHQ 2 & 9 Depression Scale- Over the past 2 weeks, how often have you been bothered by any of the following problems? Little interest or pleasure in doing things: 0 Feeling down, depressed, or hopeless (PHQ Adolescent also includes...irritable): 0 PHQ-2 Total Score: 0 Trouble falling or staying asleep, or sleeping too much: 1 Feeling tired or having little energy: 0 Poor appetite or overeating (PHQ Adolescent also includes...weight loss): 0 Feeling bad about yourself - or that you are a failure or have let yourself or your family down: 0 Trouble concentrating on things, such as reading the newspaper or watching television (PHQ Adolescent also includes...like school work): 0 Moving or speaking so slowly that other people could have noticed. Or the opposite - being so fidgety or restless that you  have been moving around a lot more than usual: 0 Thoughts that you would be better off dead, or of hurting yourself in some way: 0 PHQ-9 Total Score: 1 If you checked off any problems, how difficult have these problems made it for you to do your work, take care of things at home, or get along with other people?: Not difficult at all     Goals Addressed             This Visit's Progress    Patient Stated   On track    Continue current lifestyle              Objective:    Today's Vitals   03/20/24 0944  BP: 130/80  Weight: 165 lb (74.8 kg)  Height: 5' 9.5 (1.765 m)   Body mass index is 24.02 kg/m.  Hearing/Vision screen Hearing Screening - Comments:: No difficulties  Vision Screening - Comments:: Patient wears glasses. Patient sees [facility] Dr Myrla  Immunizations and Health Maintenance Health Maintenance  Topic Date Due   Medicare Annual Wellness (AWV)  12/23/2022   OPHTHALMOLOGY EXAM  04/23/2024   HEMOGLOBIN A1C  08/17/2024   FOOT EXAM  10/13/2024   Diabetic kidney evaluation - eGFR measurement  11/24/2024   Diabetic kidney evaluation - Urine ACR  02/16/2025   DTaP/Tdap/Td (2 - Td or Tdap) 05/25/2031   Pneumococcal Vaccine: 50+ Years  Completed   Influenza Vaccine  Completed   Zoster Vaccines- Shingrix  Completed   Meningococcal B Vaccine  Aged Out   Colonoscopy  Discontinued   COVID-19 Vaccine  Discontinued   Hepatitis C Screening  Discontinued        Assessment/Plan:  This is a routine wellness examination for [place].  Patient Care Team: Cleatus Arlyss RAMAN, MD as PCP - General (Family Medicine) Perla Evalene PARAS, MD as PCP - Cardiology (Cardiology) Lucio Franky PARAS, OD (Optometry)  I have personally reviewed and noted the following in the patients chart:   Medical and social history Use of alcohol, tobacco or illicit drugs  Current medications and supplements including opioid prescriptions. Functional ability and status Nutritional  status Physical activity Advanced directives List of other physicians Hospitalizations, surgeries, and ER visits in previous 12 months Vitals Screenings to include cognitive, depression, and falls Referrals and appointments  No orders of the defined types were placed in this encounter.  In addition, I have reviewed and discussed with patient certain preventive protocols, quality metrics, and best practice recommendations. A written personalized care plan for preventive services as well as  general preventive health recommendations were provided to patient.   Lyle MARLA Right, [place]   03/20/2024   No follow-ups on file.  After Visit Summary: (MyChart) Due to this being a telephonic visit, the after visit summary with patients personalized plan was offered to patient via MyChart   Nurse Notes: No voiced concerns "

## 2024-04-24 ENCOUNTER — Ambulatory Visit: Admitting: Pharmacist

## 2024-06-22 ENCOUNTER — Ambulatory Visit: Admitting: Internal Medicine

## 2024-07-20 ENCOUNTER — Ambulatory Visit: Admitting: Student

## 2024-08-17 ENCOUNTER — Ambulatory Visit: Admitting: Internal Medicine

## 2025-03-23 ENCOUNTER — Ambulatory Visit
